# Patient Record
Sex: Female | Born: 1975 | Race: Black or African American | Hispanic: No | Marital: Single | Smoking: Current every day smoker
Health system: Southern US, Community
[De-identification: ages and names within clinical notes are randomized; demographics above are authoritative.]

## PROBLEM LIST (undated history)

## (undated) DIAGNOSIS — D509 Iron deficiency anemia, unspecified: Secondary | ICD-10-CM

## (undated) DIAGNOSIS — F319 Bipolar disorder, unspecified: Secondary | ICD-10-CM

## (undated) DIAGNOSIS — E119 Type 2 diabetes mellitus without complications: Secondary | ICD-10-CM

## (undated) DIAGNOSIS — G4730 Sleep apnea, unspecified: Secondary | ICD-10-CM

## (undated) DIAGNOSIS — F17200 Nicotine dependence, unspecified, uncomplicated: Secondary | ICD-10-CM

## (undated) DIAGNOSIS — G8929 Other chronic pain: Secondary | ICD-10-CM

## (undated) DIAGNOSIS — J45909 Unspecified asthma, uncomplicated: Secondary | ICD-10-CM

## (undated) DIAGNOSIS — D219 Benign neoplasm of connective and other soft tissue, unspecified: Secondary | ICD-10-CM

## (undated) DIAGNOSIS — I10 Essential (primary) hypertension: Secondary | ICD-10-CM

## (undated) HISTORY — PX: ESOPHAGOGASTRODUODENOSCOPY: SHX1529

## (undated) HISTORY — DX: Bipolar disorder, unspecified: F31.9

## (undated) HISTORY — PX: OTHER SURGICAL HISTORY: SHX169

---

## 2004-07-22 ENCOUNTER — Encounter: Admission: RE | Admit: 2004-07-22 | Discharge: 2004-07-22 | Payer: Self-pay | Admitting: Neurology

## 2004-08-12 ENCOUNTER — Encounter: Admission: RE | Admit: 2004-08-12 | Discharge: 2004-08-12 | Payer: Self-pay | Admitting: Neurology

## 2004-11-25 ENCOUNTER — Ambulatory Visit: Payer: Self-pay | Admitting: Specialist

## 2005-01-09 ENCOUNTER — Ambulatory Visit (HOSPITAL_BASED_OUTPATIENT_CLINIC_OR_DEPARTMENT_OTHER): Admission: RE | Admit: 2005-01-09 | Discharge: 2005-01-09 | Payer: Self-pay | Admitting: Neurology

## 2005-02-28 ENCOUNTER — Ambulatory Visit (HOSPITAL_BASED_OUTPATIENT_CLINIC_OR_DEPARTMENT_OTHER): Admission: RE | Admit: 2005-02-28 | Discharge: 2005-02-28 | Payer: Self-pay | Admitting: Psychiatry

## 2005-03-06 ENCOUNTER — Emergency Department: Payer: Self-pay | Admitting: Emergency Medicine

## 2005-03-08 ENCOUNTER — Emergency Department: Payer: Self-pay | Admitting: Emergency Medicine

## 2005-10-19 ENCOUNTER — Ambulatory Visit: Payer: Self-pay | Admitting: Obstetrics & Gynecology

## 2005-10-20 ENCOUNTER — Emergency Department: Payer: Self-pay | Admitting: Emergency Medicine

## 2005-11-21 ENCOUNTER — Ambulatory Visit: Payer: Self-pay | Admitting: Obstetrics & Gynecology

## 2005-11-21 ENCOUNTER — Ambulatory Visit (HOSPITAL_COMMUNITY): Admission: RE | Admit: 2005-11-21 | Discharge: 2005-11-21 | Payer: Self-pay | Admitting: Obstetrics & Gynecology

## 2006-07-10 ENCOUNTER — Emergency Department: Payer: Self-pay | Admitting: Emergency Medicine

## 2007-07-26 ENCOUNTER — Ambulatory Visit: Payer: Self-pay | Admitting: Family Medicine

## 2007-08-23 ENCOUNTER — Ambulatory Visit: Payer: Self-pay | Admitting: Advanced Practice Midwife

## 2008-01-07 ENCOUNTER — Inpatient Hospital Stay: Payer: Self-pay

## 2008-03-12 ENCOUNTER — Ambulatory Visit: Payer: Self-pay | Admitting: Obstetrics and Gynecology

## 2009-08-31 ENCOUNTER — Emergency Department: Payer: Self-pay | Admitting: Emergency Medicine

## 2009-11-03 IMAGING — US US OB < 14 WEEKS - US OB TV
1 series · 17 of 20 positions shown · non-contrast
Comparison: none

REASON FOR EXAM: Dates and viability
COMMENTS:

[Series 1: us ob < 14 weeks - us ob tv · 17 of 20 slices shown]
[im 1/20]
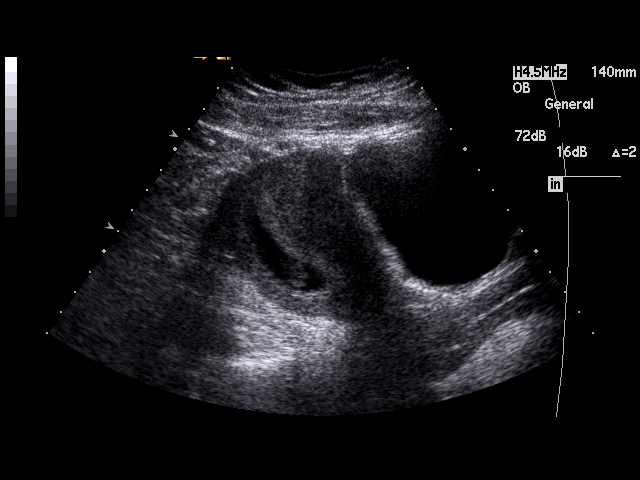
[im 2/20]
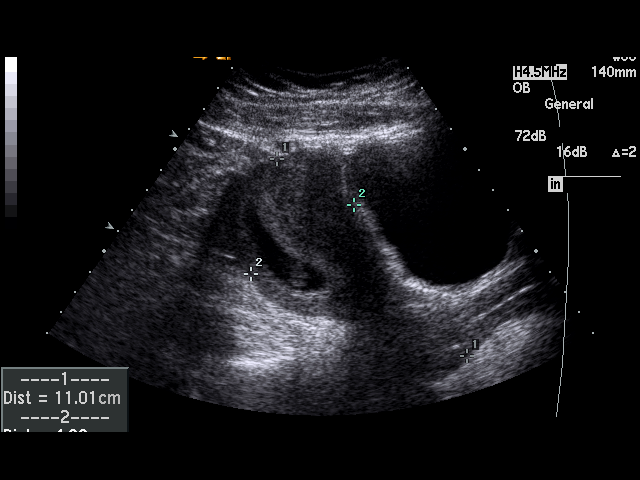
[im 3/20]
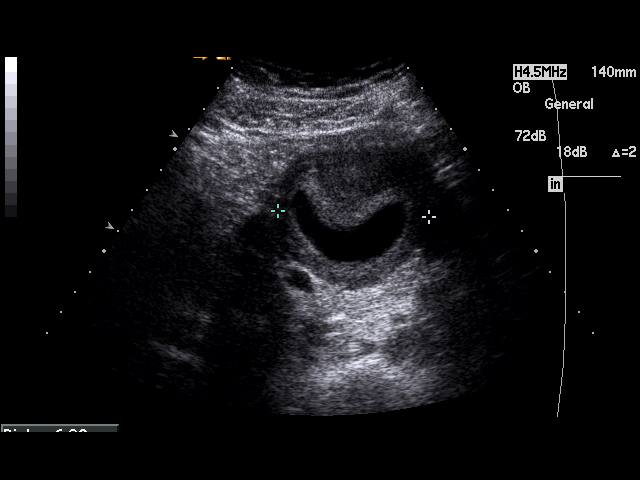
[im 5/20]
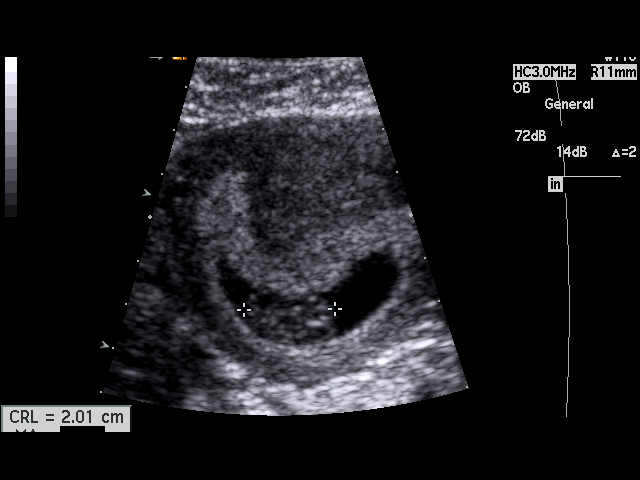
[im 6/20]
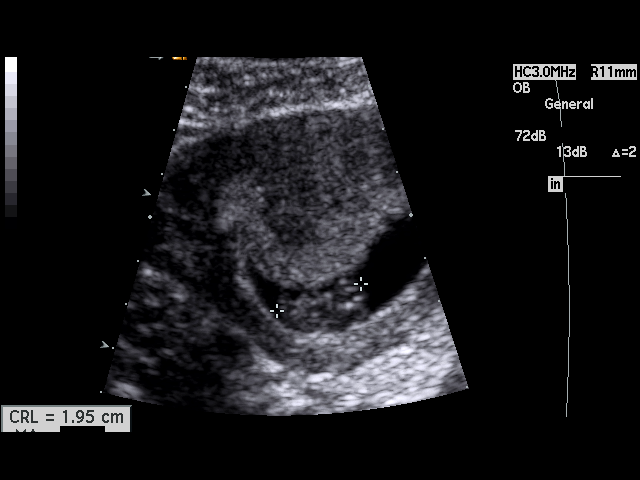
[im 7/20]
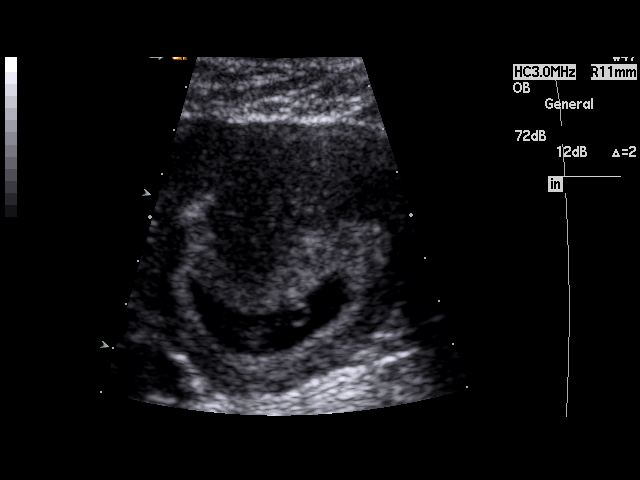
[im 8/20]
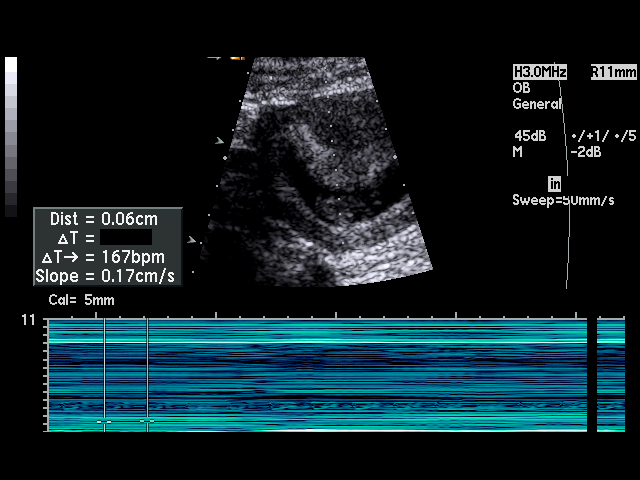
[im 9/20]
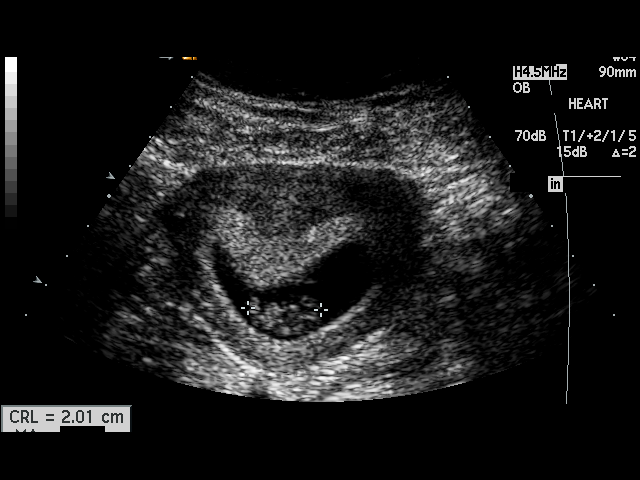
[im 11/20]
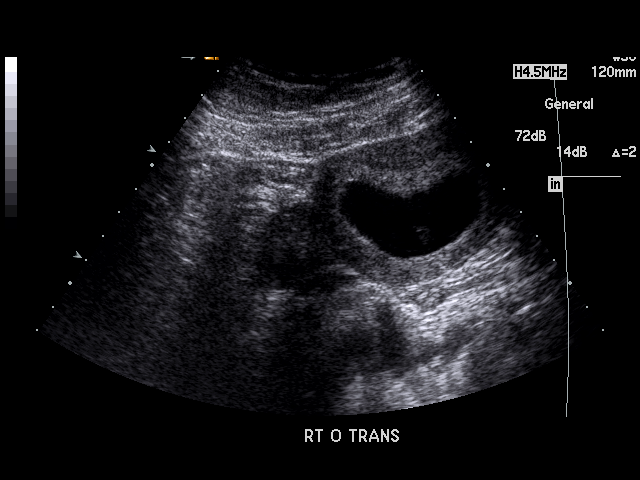
[im 12/20]
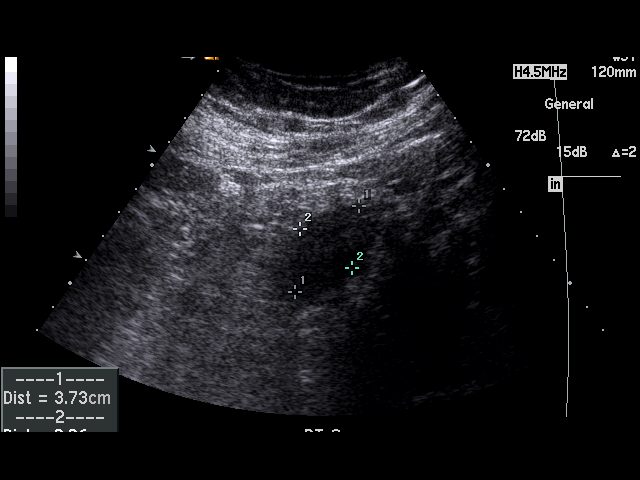
[im 13/20]
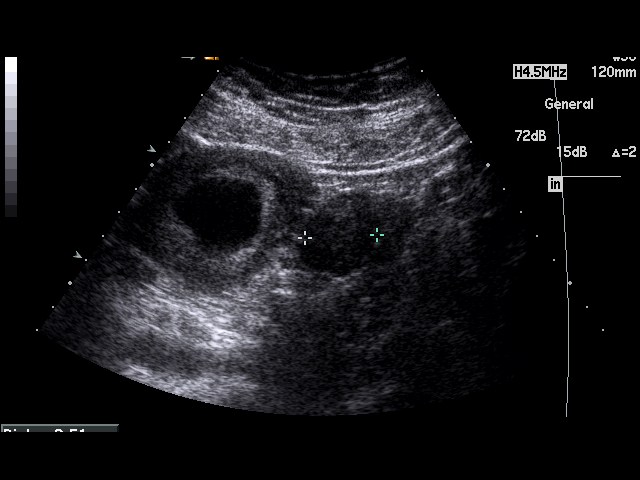
[im 14/20]
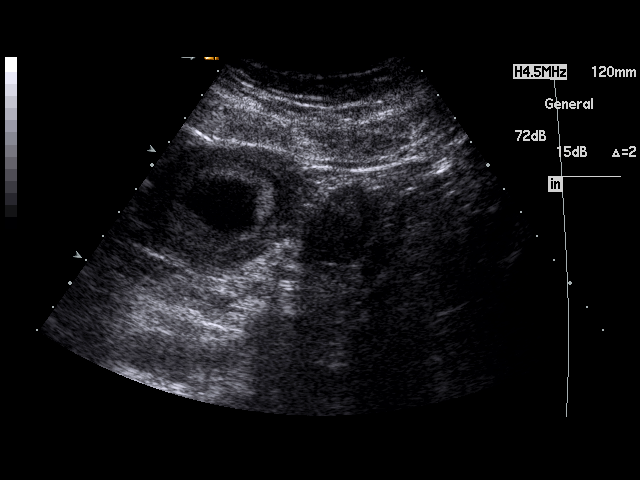
[im 15/20]
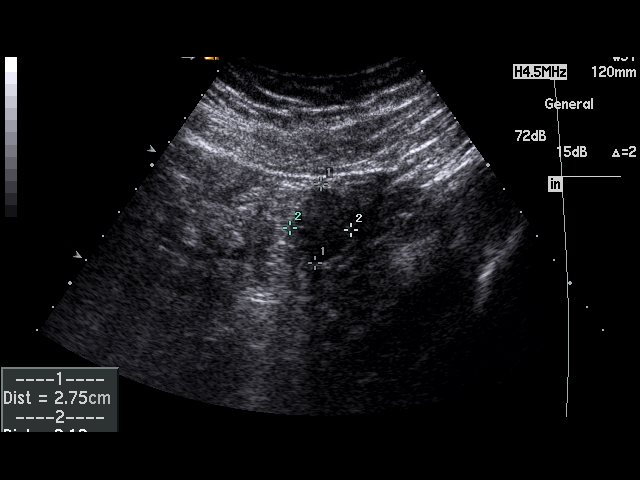
[im 16/20]
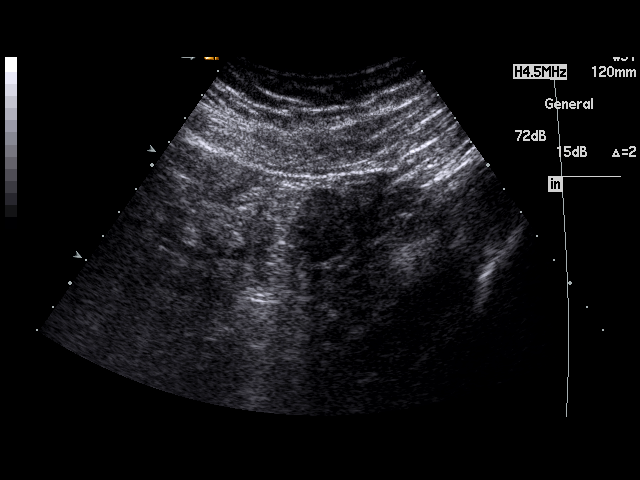
[im 18/20]
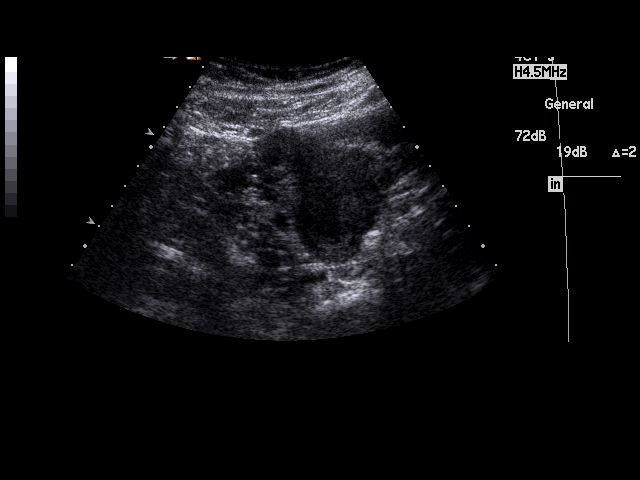
[im 19/20]
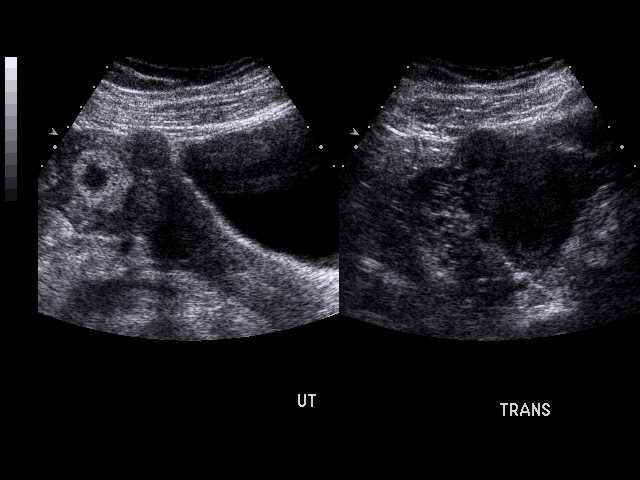
[im 20/20]
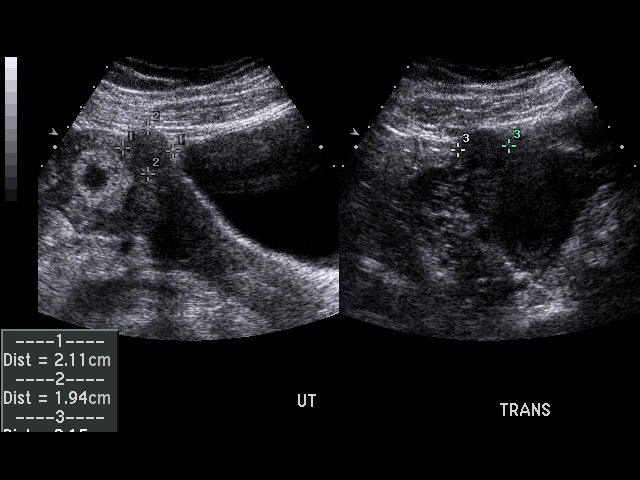

[17 of 20 positions shown; findings below may reference images not displayed]

PROCEDURE:     US  - US OB LESS THAN 14 WEEKS  - July 26, 2007  [DATE]

RESULT:     There is observed s single, living intrauterine gestation.
Embryo cardiac activity was monitored at 167 beats per minute. There is a
partially exophytic, hypoechoic mass off the anterior aspect of the uterus
consistent with a uterine fibroid that measures to 2.15 cm at maximum
diameter.

Fetal parts are too small to evaluate at this time. The crown-rump length
measures 2.0 cm which corresponds to estimated gestational age of 8 weeks, 4
days. Ultrasound EDD is 03/02/2008.

The maternal ovaries are visualized and are normal in appearance
bilaterally. No abnormal adnexal masses are seen. No free fluid is noted in
the maternal pelvis.
IMPRESSION: 1.  Living intrauterine gestation of approximately 8 weeks, 4 days
gestational age.
2.  Ultrasound EDD is 03/02/2008.
3.  There is a hypoechoic mass off the anterior aspect of the uterus
consistent with a uterine fibroid that measures 2.15 cm at maximum diameter.

## 2010-01-20 ENCOUNTER — Ambulatory Visit: Payer: Self-pay | Admitting: Internal Medicine

## 2010-01-29 ENCOUNTER — Emergency Department: Payer: Self-pay | Admitting: Emergency Medicine

## 2010-02-08 ENCOUNTER — Ambulatory Visit: Payer: Self-pay | Admitting: Surgery

## 2010-02-08 ENCOUNTER — Ambulatory Visit: Payer: Self-pay | Admitting: Internal Medicine

## 2010-02-14 ENCOUNTER — Ambulatory Visit: Payer: Self-pay | Admitting: Internal Medicine

## 2010-02-19 ENCOUNTER — Ambulatory Visit: Payer: Self-pay | Admitting: Internal Medicine

## 2010-06-29 ENCOUNTER — Emergency Department: Payer: Self-pay | Admitting: Emergency Medicine

## 2010-07-19 ENCOUNTER — Ambulatory Visit: Payer: Self-pay | Admitting: Internal Medicine

## 2010-07-21 ENCOUNTER — Ambulatory Visit: Payer: Self-pay | Admitting: Internal Medicine

## 2011-01-17 ENCOUNTER — Emergency Department: Payer: Self-pay | Admitting: Emergency Medicine

## 2011-04-30 ENCOUNTER — Emergency Department: Payer: Self-pay | Admitting: Emergency Medicine

## 2011-05-09 ENCOUNTER — Emergency Department: Payer: Self-pay | Admitting: Internal Medicine

## 2014-06-26 ENCOUNTER — Emergency Department: Payer: Self-pay | Admitting: Internal Medicine

## 2014-08-01 ENCOUNTER — Emergency Department: Payer: Self-pay | Admitting: Emergency Medicine

## 2014-09-24 ENCOUNTER — Other Ambulatory Visit: Payer: Self-pay

## 2014-09-24 DIAGNOSIS — Z1231 Encounter for screening mammogram for malignant neoplasm of breast: Secondary | ICD-10-CM

## 2014-11-12 DIAGNOSIS — Z8371 Family history of colonic polyps: Secondary | ICD-10-CM | POA: Insufficient documentation

## 2014-12-07 ENCOUNTER — Encounter: Payer: Self-pay | Admitting: *Deleted

## 2014-12-08 ENCOUNTER — Ambulatory Visit: Payer: Medicaid Other | Admitting: Anesthesiology

## 2014-12-08 ENCOUNTER — Encounter: Payer: Self-pay | Admitting: *Deleted

## 2014-12-08 ENCOUNTER — Encounter
Admission: RE | Disposition: A | Discharge status: Home / Self Care | Payer: Self-pay | Source: Ambulatory Visit | Attending: Gastroenterology

## 2014-12-08 ENCOUNTER — Ambulatory Visit
Admission: RE | Admit: 2014-12-08 | Discharge: 2014-12-08 | Disposition: A | Discharge status: Home / Self Care | Payer: Medicaid Other | Source: Ambulatory Visit | Attending: Gastroenterology | Admitting: Gastroenterology

## 2014-12-08 DIAGNOSIS — Z79899 Other long term (current) drug therapy: Secondary | ICD-10-CM | POA: Insufficient documentation

## 2014-12-08 DIAGNOSIS — Z7951 Long term (current) use of inhaled steroids: Secondary | ICD-10-CM | POA: Diagnosis not present

## 2014-12-08 DIAGNOSIS — I10 Essential (primary) hypertension: Secondary | ICD-10-CM | POA: Insufficient documentation

## 2014-12-08 DIAGNOSIS — Z888 Allergy status to other drugs, medicaments and biological substances status: Secondary | ICD-10-CM | POA: Insufficient documentation

## 2014-12-08 DIAGNOSIS — Z800 Family history of malignant neoplasm of digestive organs: Secondary | ICD-10-CM | POA: Insufficient documentation

## 2014-12-08 DIAGNOSIS — Z1211 Encounter for screening for malignant neoplasm of colon: Secondary | ICD-10-CM | POA: Diagnosis not present

## 2014-12-08 DIAGNOSIS — Z791 Long term (current) use of non-steroidal anti-inflammatories (NSAID): Secondary | ICD-10-CM | POA: Diagnosis not present

## 2014-12-08 DIAGNOSIS — Z8371 Family history of colonic polyps: Secondary | ICD-10-CM | POA: Diagnosis not present

## 2014-12-08 DIAGNOSIS — J45909 Unspecified asthma, uncomplicated: Secondary | ICD-10-CM | POA: Insufficient documentation

## 2014-12-08 HISTORY — DX: Unspecified asthma, uncomplicated: J45.909

## 2014-12-08 HISTORY — DX: Type 2 diabetes mellitus without complications: E11.9

## 2014-12-08 HISTORY — PX: COLONOSCOPY WITH PROPOFOL: SHX5780

## 2014-12-08 HISTORY — DX: Essential (primary) hypertension: I10

## 2014-12-08 LAB — POCT PREGNANCY, URINE: Preg Test, Ur: NEGATIVE

## 2014-12-08 SURGERY — COLONOSCOPY WITH PROPOFOL
Anesthesia: General

## 2014-12-08 MED ORDER — FENTANYL CITRATE (PF) 100 MCG/2ML IJ SOLN
INTRAMUSCULAR | Status: DC | PRN
  Administered 2014-12-08: 50 ug via INTRAVENOUS

## 2014-12-08 MED ORDER — IPRATROPIUM-ALBUTEROL 0.5-2.5 (3) MG/3ML IN SOLN
3.0000 mL | Freq: Four times a day (QID) | RESPIRATORY_TRACT | Status: DC
Start: 2014-12-08 — End: 2014-12-09
  Administered 2014-12-08: 3 mL via RESPIRATORY_TRACT

## 2014-12-08 MED ORDER — SODIUM CHLORIDE 0.9 % IV SOLN
INTRAVENOUS | Status: DC
  Administered 2014-12-08: 15:00:00 via INTRAVENOUS

## 2014-12-08 MED ORDER — PROPOFOL INFUSION 10 MG/ML OPTIME
INTRAVENOUS | Status: DC | PRN
  Administered 2014-12-08: 140 ug/kg/min via INTRAVENOUS

## 2014-12-08 MED ORDER — PROPOFOL 10 MG/ML IV BOLUS
INTRAVENOUS | Status: DC | PRN
  Administered 2014-12-08: 60 mg via INTRAVENOUS

## 2014-12-08 MED ORDER — MIDAZOLAM HCL 2 MG/2ML IJ SOLN
INTRAMUSCULAR | Status: DC | PRN
  Administered 2014-12-08: 1 mg via INTRAVENOUS

## 2014-12-08 MED ORDER — IPRATROPIUM-ALBUTEROL 0.5-2.5 (3) MG/3ML IN SOLN
RESPIRATORY_TRACT | Status: AC
  Administered 2014-12-08: 3 mL via RESPIRATORY_TRACT
  Filled 2014-12-08: qty 3

## 2014-12-08 NOTE — Anesthesia Postprocedure Evaluation (Signed)
  Anesthesia Post-op Note  Patient: Desiree Stone  Procedure(s) Performed: Procedure(s): COLONOSCOPY WITH PROPOFOL (N/A)  Anesthesia type:General  Patient location: PACU  Post pain: Pain level controlled  Post assessment: Post-op Vital signs reviewed, Patient's Cardiovascular Status Stable, Respiratory Function Stable, Patent Airway and No signs of Nausea or vomiting  Post vital signs: Reviewed and stable  Last Vitals:  Filed Vitals:   12/08/14 1622  BP: 124/81  Pulse: 87  Temp:   Resp: 21    Level of consciousness: awake, alert  and patient cooperative  Complications: No apparent anesthesia complications

## 2014-12-08 NOTE — Op Note (Signed)
[facility] Gastroenterology Patient Name: Desiree Stone Procedure Date: 12/08/2014 3:12 PM MRN: [id number] Account #: [id number] Date of Birth: 09-02-1975 Admit Type: Outpatient Age: 39 Room: [facility] ENDO ROOM 3 Gender: Female Note Status: Finalized Procedure:         Colonoscopy Indications:       Family history of colon cancer in multiple second-degree                     relatives, Family history of colonic polyps in a                     first-degree relative Providers:         Lollie Sails, MD Referring MD:      Sena Hitch, MD (Referring MD) Medicines:         Monitored Anesthesia Care Complications:     No immediate complications. Procedure:         Pre-Anesthesia Assessment:                    - ASA Grade Assessment: III - A patient with severe                     systemic disease.                    After obtaining informed consent, the colonoscope was                     passed under direct vision. Throughout the procedure, the                     patient's blood pressure, pulse, and oxygen saturations                     were monitored continuously. The Colonoscope was                     introduced through the anus and advanced to the the cecum,                     identified by appendiceal orifice and ileocecal valve. The                     colonoscopy was performed without difficulty. The patient                     tolerated the procedure well. The quality of the bowel                     preparation was fair. Findings:      The colon (entire examined portion) appeared normal.      The digital rectal exam was normal. Impression:        - The entire examined colon is normal.                    - No specimens collected. Recommendation:    - Discharge patient to home.                    - Repeat colonoscopy in 5 years for screening purposes. Procedure Code(s): --- Professional ---                    [phone], Colonoscopy, flexible;  diagnostic, including  collection of specimen(s) by brushing or washing, when                     performed (separate procedure) Diagnosis Code(s): --- Professional ---                    V16.0, Family history of malignant neoplasm of                     gastrointestinal tract                    V18.51, Family history of colonic polyps CPT copyright 2014 American Medical Association. All rights reserved. The codes documented in this report are preliminary and upon coder review may  be revised to meet current compliance requirements. Lollie Sails, MD 12/08/2014 3:45:12 PM This report has been signed electronically. Number of Addenda: 0 Note Initiated On: 12/08/2014 3:12 PM Scope Withdrawal Time: 0 hours 9 minutes 30 seconds  Total Procedure Duration: 0 hours 16 minutes 36 seconds       [facility]

## 2014-12-08 NOTE — H&P (Signed)
Outpatient short stay form Pre-procedure 12/08/2014 3:08 PM Lollie Sails MD  Primary Physician: Dr Shade Flood  Reason for visit:  Colonoscopy  History of present illness:  Family history of colon polyps and multiple's first degree relatives and family history of colon cancer in secondary relative patient is a 40 year old female presenting for a risk colonoscopy due to history as noted above. He tolerated her prep for procedure well. He denies use of any aspirin or NSAID products. She does have a history of asthma but states that this is well controlled and she will have only about perhaps twice a year.    Current facility-administered medications:  .  0.9 %  sodium chloride infusion, , Intravenous, Continuous, Lollie Sails, MD, Last Rate: 50 mL/hr at 12/08/14 1438 .  0.9 %  sodium chloride infusion, , Intravenous, Continuous, Lollie Sails, MD .  ipratropium-albuterol (DUONEB) 0.5-2.5 (3) MG/3ML nebulizer solution 3 mL, 3 mL, Nebulization, Q6H, Lollie Sails, MD, 3 mL at 12/08/14 1438  Prescriptions prior to admission  Medication Sig Dispense Refill Last Dose  . albuterol (PROVENTIL HFA;VENTOLIN HFA) 108 (90 BASE) MCG/ACT inhaler Inhale into the lungs every 6 (six) hours as needed for wheezing or shortness of breath.     . enalapril (VASOTEC) 2.5 MG tablet Take 2.5 mg by mouth daily.     . fluticasone (FLOVENT DISKUS) 50 MCG/BLIST diskus inhaler Inhale 1 puff into the lungs 2 (two) times daily.     . meloxicam (MOBIC) 7.5 MG tablet Take 7.5 mg by mouth daily.        Allergies  Allergen Reactions  . Abilify [Aripiprazole]   . Effexor [Venlafaxine]   . Wellbutrin [Bupropion]      Past Medical History  Diagnosis Date  . Asthma   . Hypertension     Review of systems:      Physical Exam    Heart and lungs: Regular rate and rhythm without rub or gallop, lungs are bilaterally clear    HEENT: Normocephalic atraumatic eyes are anicteric    Other:     Pertinant  exam for procedure: Soft nontender nondistended bowel sounds positive normoactive    Planned proceedures: Anoscopy and indicated procedures I have discussed the risks benefits and complications of procedures to include not limited to bleeding, infection, perforation and the risk of sedation and the patient wishes to proceed.    Lollie Sails, MD Gastroenterology 12/08/2014  3:08 PM

## 2014-12-08 NOTE — Anesthesia Preprocedure Evaluation (Signed)
Anesthesia Evaluation  Patient identified by MRN, date of birth, ID band Patient awake    Reviewed: Allergy & Precautions, H&P , NPO status , Patient's Chart, lab work & pertinent test results, reviewed documented beta blocker date and time   History of Anesthesia Complications Negative for: history of anesthetic complications  Airway Mallampati: II  TM Distance: >3 FB Neck ROM: full    Dental  (+) Implants, Poor Dentition   Pulmonary asthma , Current Smoker,  breath sounds clear to auscultation  Pulmonary exam normal       Cardiovascular Exercise Tolerance: Good hypertension, Normal cardiovascular examRhythm:regular Rate:Normal     Neuro/Psych negative neurological ROS  negative psych ROS   GI/Hepatic negative GI ROS, Neg liver ROS,   Endo/Other  negative endocrine ROS  Renal/GU negative Renal ROS  negative genitourinary   Musculoskeletal   Abdominal   Peds  Hematology negative hematology ROS (+)   Anesthesia Other Findings Past Medical History:   Asthma                                                       Hypertension                                                Super Morbid Obesity BMI 56 Signs and symptoms suggestive of sleep apnea    Reproductive/Obstetrics negative OB ROS                             Anesthesia Physical Anesthesia Plan  ASA: III  Anesthesia Plan: General   Post-op Pain Management:    Induction:   Airway Management Planned:   Additional Equipment:   Intra-op Plan:   Post-operative Plan:   Informed Consent: I have reviewed the patients History and Physical, chart, labs and discussed the procedure including the risks, benefits and alternatives for the proposed anesthesia with the patient or authorized representative who has indicated his/her understanding and acceptance.   Dental Advisory Given  Plan Discussed with: Anesthesiologist, CRNA and  Surgeon  Anesthesia Plan Comments:         Anesthesia Quick Evaluation

## 2014-12-08 NOTE — Transfer of Care (Signed)
Immediate Anesthesia Transfer of Care Note  Patient: Desiree Stone  Procedure(s) Performed: Procedure(s): COLONOSCOPY WITH PROPOFOL (N/A)  Patient Location: PACU  Anesthesia Type:General  Level of Consciousness: awake and patient cooperative  Airway & Oxygen Therapy: Patient Spontanous Breathing and Patient connected to nasal cannula oxygen  Post-op Assessment: Report given to RN  Post vital signs: Reviewed and stable  Last Vitals:  Filed Vitals:   12/08/14 1551  BP: 115/67  Pulse: 97  Temp: 96.6  Resp: 22    Complications: No apparent anesthesia complications

## 2014-12-14 ENCOUNTER — Encounter: Payer: Self-pay | Admitting: Gastroenterology

## 2015-08-02 ENCOUNTER — Encounter: Payer: Self-pay | Admitting: *Deleted

## 2015-08-02 ENCOUNTER — Emergency Department
Admission: EM | Admit: 2015-08-02 | Discharge: 2015-08-02 | Disposition: A | Discharge status: Home / Self Care | Payer: Medicaid Other | Attending: Emergency Medicine | Admitting: Emergency Medicine

## 2015-08-02 ENCOUNTER — Emergency Department: Payer: Medicaid Other

## 2015-08-02 DIAGNOSIS — M7532 Calcific tendinitis of left shoulder: Secondary | ICD-10-CM | POA: Insufficient documentation

## 2015-08-02 DIAGNOSIS — Z7951 Long term (current) use of inhaled steroids: Secondary | ICD-10-CM | POA: Insufficient documentation

## 2015-08-02 DIAGNOSIS — I10 Essential (primary) hypertension: Secondary | ICD-10-CM | POA: Insufficient documentation

## 2015-08-02 DIAGNOSIS — F17210 Nicotine dependence, cigarettes, uncomplicated: Secondary | ICD-10-CM | POA: Insufficient documentation

## 2015-08-02 DIAGNOSIS — E119 Type 2 diabetes mellitus without complications: Secondary | ICD-10-CM | POA: Insufficient documentation

## 2015-08-02 DIAGNOSIS — Z79899 Other long term (current) drug therapy: Secondary | ICD-10-CM | POA: Diagnosis not present

## 2015-08-02 DIAGNOSIS — M25512 Pain in left shoulder: Secondary | ICD-10-CM | POA: Diagnosis present

## 2015-08-02 MED ORDER — NAPROXEN 500 MG PO TABS: 500.0000 mg | ORAL_TABLET | Freq: Two times a day (BID) | ORAL | Status: AC

## 2015-08-02 NOTE — Discharge Instructions (Signed)
Calcific Tendinitis Calcific tendinitis occurs when crystals of calcium are deposited in a tendon. Tendons are bands of strong, fibrous tissue that attach muscles to bones. Tendons are an important part of joints. They make the joint move and they absorb some of the stress that a joint receives during use. When calcium is deposited in the tendon, the tendon becomes stiff, painful, and it can become swollen. Calcific tendinitis occurs frequently in the shoulder joint, in a structure called the rotator cuff. CAUSES  The cause of calcific tendinitis is unclear. It may be associated with:  Overuse of the tendon, such as from repetitive motion.  Excess stress on the tendon.  Aging.  Repetitive, mild injuries. SYMPTOMS   Pain may or may not be present. If it is present, it may occur when moving the joint.  Tenderness when pressure is applied to the tendon.  A snapping or popping sound when the joint moves.  Decreased motion of the joint.  Difficulty sleeping due to pain in the joint. DIAGNOSIS  Your health care provider will perform a physical exam. Imaging tests may also be used to make the diagnosis. These may include X-rays, an MRI, or a CT scan. TREATMENT  Generally, calcific tendinitis resolves on its own. Treatment for pain of calcific tendinitis may include:  Taking over-the-counter medicines, such as anti-inflammatory drugs.  Applying ice packs to the joint.  Following a specific exercise program to keep the joint working properly.  Attending physical therapy sessions.  Avoiding activities that cause pain. Treatment for more severe calcific tendinitis may require:  Injecting cortisone steroids or pain relieving medicines into or around the joint.  Manipulating the joint after you are given medicine to numb the area (local anesthetic).  Inflating the joint with sterile fluid to increase the flexibility of the tendons.  Shock wave therapy, which involves focusing sound  waves on the joint. If other treatments do not work, surgery may be done to clean out the calcium deposits and repair the tendons where needed. Most people do not need surgery. HOME CARE INSTRUCTIONS   Only take over-the-counter or prescription medicines for pain, fever, or discomfort as directed by your health care provider.  Follow your health care provider's recommendations for activity and exercise. SEEK MEDICAL CARE IF:  You notice an increase in pain or numbness.  You develop new weakness.  You notice increased joint stiffness or a sensation of looseness in the joint.  You notice increasing redness, swelling, or warmth around the joint area. SEEK IMMEDIATE MEDICAL CARE IF:  You have a fever or persistent symptoms for more than 2 to 3 days.  You have a fever and your symptoms suddenly get worse. MAKE SURE YOU:  Understand these instructions.  Will watch your condition.  Will get help right away if you are not doing well or get worse.   This information is not intended to replace advice given to you by your health care provider. Make sure you discuss any questions you have with your health care provider.   Document Released: 02/15/2008 Document Revised: 01/27/2015 Document Reviewed: 08/17/2011 Elsevier Interactive Patient Education [employer].

## 2015-08-02 NOTE — ED Notes (Signed)
Pt woke up with left shoulder pain on Saturday, pt denies any other symptoms including chest pain

## 2015-08-02 NOTE — ED Provider Notes (Signed)
[facility] Emergency Department Provider Note  ____________________________________________  Time seen: Approximately 10:06 AM  I have reviewed the triage vital signs and the nursing notes.   HISTORY  Chief Complaint Shoulder Pain    HPI Desiree Stone is a 40 y.o. female complaining of 2 days of left shoulder pain. Patient denies any provocative incident for this complaint. Patient states pain increase with movement. Patient stated there was some mild relief taking over-the-counter ibuprofen last night. Patient rates the pain as a 5/10. Patient described the pain as sharp. Pain is located at the [facility] joint. Patient is right-hand dominant.   Past Medical History  Diagnosis Date  . Asthma   . Hypertension     There are no active problems to display for this patient.   Past Surgical History  Procedure Laterality Date  . Carpel tunnel    . Esophagogastroduodenoscopy    . Colonoscopy with propofol N/A 12/08/2014    Procedure: COLONOSCOPY WITH PROPOFOL;  Surgeon: Lollie Sails, MD;  Location: [facility] ENDOSCOPY;  Service: Endoscopy;  Laterality: N/A;    Current Outpatient Rx  Name  Route  Sig  Dispense  Refill  . albuterol (PROVENTIL HFA;VENTOLIN HFA) 108 (90 BASE) MCG/ACT inhaler   Inhalation   Inhale into the lungs every 6 (six) hours as needed for wheezing or shortness of breath.         . enalapril (VASOTEC) 2.5 MG tablet   Oral   Take 2.5 mg by mouth daily.         . fluticasone (FLOVENT DISKUS) 50 MCG/BLIST diskus inhaler   Inhalation   Inhale 1 puff into the lungs 2 (two) times daily.         . meloxicam (MOBIC) 7.5 MG tablet   Oral   Take 7.5 mg by mouth daily.           Allergies Abilify; Effexor; and Wellbutrin  No family history on file.  Social History Social History  Substance Use Topics  . Smoking status: Current Every Day Smoker -- 2.00 packs/day    Types: Cigarettes  . Smokeless tobacco: Never Used  .  Alcohol Use: No    Review of Systems Constitutional: No fever/chills Eyes: No visual changes. ENT: No sore throat. Cardiovascular: Denies chest pain. Respiratory: Denies shortness of breath. Gastrointestinal: No abdominal pain.  No nausea, no vomiting.  No diarrhea.  No constipation. Genitourinary: Negative for dysuria. Musculoskeletal: Negative for back pain. Skin: Negative for rash. Neurological: Negative for headaches, focal weakness or numbness. : Endocrine: Hypertension and diabetes. Allergic/Immunilogical: See medication list   ____________________________________________   PHYSICAL EXAM:  VITAL SIGNS: ED Triage Vitals  Enc Vitals Group     BP 08/02/15 0947 130/72 mmHg     Pulse Rate 08/02/15 0947 100     Resp 08/02/15 0947 20     Temp 08/02/15 0947 98.8 F (37.1 C)     Temp Source 08/02/15 0947 Oral     SpO2 08/02/15 0947 96 %     Weight 08/02/15 0947 273 lb (123.832 kg)     Height 08/02/15 0947 4\' 11"  (1.499 m)     Head Cir --      Peak Flow --      Pain Score 08/02/15 0948 5     Pain Loc --      Pain Edu? --      Excl. in [place]? --     Constitutional: Alert and oriented. Well appearing and in no acute  distress. Eyes: Conjunctivae are normal. PERRL. EOMI. Head: Atraumatic. Nose: No congestion/rhinnorhea. Mouth/Throat: Mucous membranes are moist.  Oropharynx non-erythematous. Neck: No stridor.  No cervical spine tenderness to palpation. Hematological/Lymphatic/Immunilogical: No cervical lymphadenopathy. Cardiovascular: Normal rate, regular rhythm. Grossly normal heart sounds.  Good peripheral circulation. Respiratory: Normal respiratory effort.  No retractions. Lungs CTAB. Gastrointestinal: Soft and nontender. No distention. No abdominal bruits. No CVA tenderness. Musculoskeletal: No obvious edema or erythema to the left shoulder. Patient has moderate guarding palpation to the [facility] joint. Patient has decreased range of motion with abduction and overhead  reaching. Patient stated against resistance as a 3 over 5.  Neurologic:  Normal speech and language. No gross focal neurologic deficits are appreciated. No gait instability. Skin:  Skin is warm, dry and intact. No rash noted. Psychiatric: Mood and affect are normal. Speech and behavior are normal.  ____________________________________________   LABS (all labs ordered are listed, but only abnormal results are displayed)  Labs Reviewed - No data to display ____________________________________________  EKG   ____________________________________________  RADIOLOGY  I also 5 tendinitis of the rotator cuff.  I, Sable Feil, personally viewed and evaluated these images (plain radiographs) as part of my medical decision making, as well as reviewing the written report by the radiologist. ____________________________________________   PROCEDURES  Procedure(s) performed: None  Critical Care performed: No  ____________________________________________   INITIAL IMPRESSION / ASSESSMENT AND PLAN / ED COURSE  Pertinent labs & imaging results that were available during my care of the patient were reviewed by me and considered in my medical decision making (see chart for details).  Calcified tendinitis of the shoulder. This x-ray finding with patient. Patient was placed in a sling and start naproxen as directed. Patient advised to follow orthopedics for definitive evaluation and treatment. He shouldn't work note for 2 days. ____________________________________________   FINAL CLINICAL IMPRESSION(S) / ED DIAGNOSES  Final diagnoses:  Calcific tendinitis of left shoulder      Sable Feil, PA-C 08/02/15 1115  Harvest Dark, MD 08/02/15 1431

## 2015-08-02 NOTE — ED Notes (Signed)
States she developed some left shoulder pain on Saturday. Denies any injury.  Pain increases with movement .[name] States she had min relief with OTC ibuprofen last pm

## 2016-04-12 DIAGNOSIS — G4700 Insomnia, unspecified: Secondary | ICD-10-CM | POA: Insufficient documentation

## 2016-04-12 DIAGNOSIS — IMO0001 Reserved for inherently not codable concepts without codable children: Secondary | ICD-10-CM | POA: Insufficient documentation

## 2016-06-14 DIAGNOSIS — F17200 Nicotine dependence, unspecified, uncomplicated: Secondary | ICD-10-CM | POA: Insufficient documentation

## 2016-06-14 DIAGNOSIS — G4733 Obstructive sleep apnea (adult) (pediatric): Secondary | ICD-10-CM | POA: Insufficient documentation

## 2017-04-24 ENCOUNTER — Emergency Department
Admission: EM | Admit: 2017-04-24 | Discharge: 2017-04-24 | Disposition: A | Discharge status: Home / Self Care | Payer: Self-pay | Attending: Emergency Medicine | Admitting: Emergency Medicine

## 2017-04-24 ENCOUNTER — Other Ambulatory Visit: Payer: Self-pay

## 2017-04-24 DIAGNOSIS — J020 Streptococcal pharyngitis: Secondary | ICD-10-CM | POA: Insufficient documentation

## 2017-04-24 DIAGNOSIS — I10 Essential (primary) hypertension: Secondary | ICD-10-CM | POA: Insufficient documentation

## 2017-04-24 DIAGNOSIS — F17210 Nicotine dependence, cigarettes, uncomplicated: Secondary | ICD-10-CM | POA: Insufficient documentation

## 2017-04-24 DIAGNOSIS — R51 Headache: Secondary | ICD-10-CM | POA: Insufficient documentation

## 2017-04-24 DIAGNOSIS — Z79899 Other long term (current) drug therapy: Secondary | ICD-10-CM | POA: Insufficient documentation

## 2017-04-24 DIAGNOSIS — J45909 Unspecified asthma, uncomplicated: Secondary | ICD-10-CM | POA: Insufficient documentation

## 2017-04-24 LAB — POCT RAPID STREP A: Streptococcus, Group A Screen (Direct): POSITIVE — AB

## 2017-04-24 MED ORDER — FLUCONAZOLE 150 MG PO TABS: ORAL_TABLET | ORAL | 0 refills | Status: DC

## 2017-04-24 MED ORDER — AMOXICILLIN 400 MG/5ML PO SUSR: ORAL | 0 refills | Status: DC

## 2017-04-24 NOTE — ED Triage Notes (Signed)
Pt started with sore throat and headache yesterday - c/o chills last pm - also c/o right upper leg pain (denies injury)

## 2017-04-24 NOTE — ED Provider Notes (Signed)
[facility] Emergency Department Provider Note  ____________________________________________   First MD Initiated Contact with Patient 04/24/17 1358     (approximate)  I have reviewed the triage vital signs and the nursing notes.   HISTORY  Chief Complaint Sore Throat and Headache    HPI Desiree Stone is a 41 y.o. 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 Asthma   . Hypertension     There are no active problems to display for this patient.   Past Surgical History:  Procedure Laterality Date  . carpel tunnel    . COLONOSCOPY WITH PROPOFOL N/A 12/08/2014   Procedure: COLONOSCOPY WITH PROPOFOL;  Surgeon: Lollie Sails, MD;  Location: [facility] ENDOSCOPY;  Service: Endoscopy;  Laterality: N/A;  . ESOPHAGOGASTRODUODENOSCOPY      Prior to Admission medications   Medication Sig Start Date End Date Taking? Authorizing Provider  albuterol (PROVENTIL HFA;VENTOLIN HFA) 108 (90 BASE) MCG/ACT inhaler Inhale into the lungs every 6 (six) hours as needed for wheezing or shortness of breath.    [provider]  amoxicillin (AMOXIL) 400 MG/5ML suspension Take 2 1/4 tsp bid for 10 days 04/24/17   Versie Starks, PA-C  enalapril (VASOTEC) 2.5 MG tablet Take 2.5 mg by mouth daily.    [provider]  fluconazole (DIFLUCAN) 150 MG tablet Take one now and one in a week 04/24/17   Caryn Section, Linden Dolin, PA-C  fluticasone (FLOVENT DISKUS) 50 MCG/BLIST diskus inhaler Inhale 1 puff into the lungs 2 (two) times daily.    [provider]  meloxicam (MOBIC) 7.5 MG tablet Take 7.5 mg by mouth daily.    [provider]    Allergies Abilify [aripiprazole]; Effexor [venlafaxine]; and Wellbutrin [bupropion]  No family history on  file.  Social History Social History   Tobacco Use  . Smoking status: Current Every Day Smoker    Packs/day: 2.00    Types: Cigarettes  . Smokeless tobacco: Never Used  Substance Use Topics  . Alcohol use: No  . Drug use: No    Review of Systems  Constitutional: Positive fever/chills Eyes: No visual changes. ENT: Positive sore throat. Respiratory: Denies cough Genitourinary: Negative for dysuria. Musculoskeletal: Negative for back pain. Skin: Negative for rash.    ____________________________________________   PHYSICAL EXAM:  VITAL SIGNS: ED Triage Vitals  Enc Vitals Group     BP 04/24/17 1340 119/74     Pulse Rate 04/24/17 1340 (!) 117     Resp 04/24/17 1340 19     Temp 04/24/17 1340 99.1 F (37.3 C)     Temp Source 04/24/17 1340 Oral     SpO2 04/24/17 1340 93 %     Weight 04/24/17 1339 278 lb (126.1 kg)     Height 04/24/17 1339 4\' 11"  (1.499 m)     Head Circumference --      Peak Flow --      Pain Score 04/24/17 1338 10     Pain Loc --      Pain Edu? --      Excl. in [place]? --     Constitutional: Alert and oriented. Well appearing and in no acute distress. Eyes: Conjunctivae are normal.  Head: Atraumatic. Nose: No congestion/rhinnorhea. Mouth/Throat: Mucous membranes are moist.  Throat  is red and swollen Cardiovascular: Normal rate, regular rhythm. Heart sounds are normal, lungs are clear to auscultation Respiratory: Normal respiratory effort.  No retractions GU: deferred Musculoskeletal: FROM all extremities, warm and well perfused Neurologic:  Normal speech and language.  Skin:  Skin is warm, dry and intact. No rash noted. Psychiatric: Mood and affect are normal. Speech and behavior are normal.  ____________________________________________   LABS (all labs ordered are listed, but only abnormal results are displayed)  Labs Reviewed  POCT RAPID STREP A - Abnormal; Notable for the following components:      Result Value   Streptococcus, Group A  Screen (Direct) POSITIVE (*)    All other components within normal limits   ____________________________________________   ____________________________________________  RADIOLOGY    ____________________________________________   PROCEDURES  Procedure(s) performed: No      ____________________________________________   INITIAL IMPRESSION / ASSESSMENT AND PLAN / ED COURSE  Pertinent labs & imaging results that were available during my care of the patient were reviewed by me and considered in my medical decision making (see chart for details).  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 Use AVSMEDLIST instead to display the medication list for a patient.   Note:  This document was prepared using Dragon voice recognition software and may include unintentional dictation errors.    Versie Starks, PA-C 04/24/17 1512    Earleen Newport, MD 04/24/17 [phone]

## 2017-04-24 NOTE — Discharge Instructions (Signed)
Follow up with your regular doctor if you're not better in 3 days, use the medication as prescribed, you have been given a prescription for amoxicillin which is the antibiotic, and a prescription for Diflucan if you develop a yeast infection from the antibiotics, gargle with warm salt water, take Tylenol or Advil for pain and fever as needed, return to the emergency department if you're worsening

## 2017-04-24 NOTE — ED Notes (Signed)
See triage note.

## 2017-05-22 ENCOUNTER — Emergency Department
Admission: EM | Admit: 2017-05-22 | Discharge: 2017-05-22 | Disposition: A | Discharge status: Home / Self Care | Payer: Self-pay | Attending: Emergency Medicine | Admitting: Emergency Medicine

## 2017-05-22 ENCOUNTER — Encounter: Payer: Self-pay | Admitting: Emergency Medicine

## 2017-05-22 ENCOUNTER — Emergency Department: Payer: Self-pay

## 2017-05-22 DIAGNOSIS — R350 Frequency of micturition: Secondary | ICD-10-CM | POA: Insufficient documentation

## 2017-05-22 DIAGNOSIS — J45909 Unspecified asthma, uncomplicated: Secondary | ICD-10-CM | POA: Insufficient documentation

## 2017-05-22 DIAGNOSIS — I10 Essential (primary) hypertension: Secondary | ICD-10-CM | POA: Insufficient documentation

## 2017-05-22 DIAGNOSIS — Z79899 Other long term (current) drug therapy: Secondary | ICD-10-CM | POA: Insufficient documentation

## 2017-05-22 DIAGNOSIS — R102 Pelvic and perineal pain: Secondary | ICD-10-CM

## 2017-05-22 DIAGNOSIS — Z87891 Personal history of nicotine dependence: Secondary | ICD-10-CM | POA: Insufficient documentation

## 2017-05-22 DIAGNOSIS — D259 Leiomyoma of uterus, unspecified: Secondary | ICD-10-CM | POA: Insufficient documentation

## 2017-05-22 LAB — URINALYSIS, COMPLETE (UACMP) WITH MICROSCOPIC
BACTERIA UA: NONE SEEN
BILIRUBIN URINE: NEGATIVE
Glucose, UA: NEGATIVE mg/dL
Hgb urine dipstick: NEGATIVE
Ketones, ur: NEGATIVE mg/dL
LEUKOCYTES UA: NEGATIVE
Nitrite: NEGATIVE
PH: 7 (ref 5.0–8.0)
Protein, ur: NEGATIVE mg/dL
RBC / HPF: NONE SEEN RBC/hpf (ref 0–5)
SPECIFIC GRAVITY, URINE: 1.014 (ref 1.005–1.030)

## 2017-05-22 LAB — POCT PREGNANCY, URINE: PREG TEST UR: NEGATIVE

## 2017-05-22 MED ORDER — NAPROXEN 500 MG PO TABS: 500.0000 mg | ORAL_TABLET | Freq: Two times a day (BID) | ORAL | 0 refills | Status: DC

## 2017-05-22 MED ORDER — TRAMADOL HCL 50 MG PO TABS: 50.0000 mg | ORAL_TABLET | Freq: Four times a day (QID) | ORAL | 0 refills | Status: AC | PRN

## 2017-05-22 NOTE — ED Triage Notes (Signed)
Pt in via POV with complaints of urinary frequency and lower abdominal pain since this morning.  Pt denies any N/V/D.  Pt abmulatory to triage, vitals WDL, NAD noted at this time.

## 2017-05-22 NOTE — ED Provider Notes (Signed)
[facility] Emergency Department Provider Note   ____________________________________________   First MD Initiated Contact with Patient 05/22/17 1143     (approximate)  I have reviewed the triage vital signs and the nursing notes.   HISTORY  Chief Complaint Urinary Tract Infection    HPI Desiree Stone is a 42 y.o. female patient is complaining of urinary frequency and low bone abdominal pain with a.m. awakening. Patient denies nausea vomiting diarrhea. Patient denies flank pain or fever. Patient rates the pain as a 7/10. Patient describes the pain as "achy/tender/throbbing". No palliative measures for complaint.   Past Medical History:  Diagnosis Date  . Asthma   . Hypertension     There are no active problems to display for this patient.   Past Surgical History:  Procedure Laterality Date  . carpel tunnel    . COLONOSCOPY WITH PROPOFOL N/A 12/08/2014   Procedure: COLONOSCOPY WITH PROPOFOL;  Surgeon: Lollie Sails, MD;  Location: [facility] ENDOSCOPY;  Service: Endoscopy;  Laterality: N/A;  . ESOPHAGOGASTRODUODENOSCOPY      Prior to Admission medications   Medication Sig Start Date End Date Taking? Authorizing Provider  albuterol (PROVENTIL HFA;VENTOLIN HFA) 108 (90 BASE) MCG/ACT inhaler Inhale into the lungs every 6 (six) hours as needed for wheezing or shortness of breath.    [provider]  amoxicillin (AMOXIL) 400 MG/5ML suspension Take 2 1/4 tsp bid for 10 days 04/24/17   Versie Starks, PA-C  enalapril (VASOTEC) 2.5 MG tablet Take 2.5 mg by mouth daily.    [provider]  fluconazole (DIFLUCAN) 150 MG tablet Take one now and one in a week 04/24/17   Caryn Section, Linden Dolin, PA-C  fluticasone (FLOVENT DISKUS) 50 MCG/BLIST diskus inhaler Inhale 1 puff into the lungs 2 (two) times daily.    [provider]  meloxicam (MOBIC) 7.5 MG tablet Take 7.5 mg by mouth daily.    [provider]  naproxen (NAPROSYN) 500 MG  tablet Take 1 tablet (500 mg total) by mouth 2 (two) times daily with a meal. 05/22/17   Sable Feil, PA-C  traMADol (ULTRAM) 50 MG tablet Take 1 tablet (50 mg total) by mouth every 6 (six) hours as needed. 05/22/17 05/22/18  Sable Feil, PA-C    Allergies   No family history on file.  Social History Social History   Tobacco Use  . Smoking status: Former Smoker    Packs/day: 0.00    Types: Cigarettes    Last attempt to quit: 04/04/2017    Years since quitting: 0.1  . Smokeless tobacco: Never Used  Substance Use Topics  . Alcohol use: No  . Drug use: No    Review of Systems Constitutional: No fever/chills Eyes: No visual changes. ENT: No sore throat. Cardiovascular: Denies chest pain. Respiratory: Denies shortness of breath. Gastrointestinal: No abdominal pain.  No nausea, no vomiting.  No diarrhea.  No constipation. Genitourinary: Urinary frequency and pelvic pain. Musculoskeletal: Negative for back pain. Skin: Negative for rash. Neurological: Negative for headaches, focal weakness or numbness. Endocrine:Hypertension ____________________________________________   PHYSICAL EXAM:  VITAL SIGNS: ED Triage Vitals  Enc Vitals Group     BP 05/22/17 1111 (!) 164/72     Pulse Rate 05/22/17 1111 74     Resp --      Temp 05/22/17 1111 98.1 F (36.7 C)     Temp Source 05/22/17 1111 Oral     SpO2 05/22/17 1111 95 %     Weight 05/22/17  1112 260 lb (117.9 kg)     Height 05/22/17 1112 4\' 11"  (1.499 m)     Head Circumference --      Peak Flow --      Pain Score 05/22/17 1111 7     Pain Loc --      Pain Edu? --      Excl. in [place]? --    Constitutional: Alert and oriented. Well appearing and in no acute distress.Mobid obesity. Cardiovascular: Normal rate, regular rhythm. Grossly normal heart sounds.  Good peripheral circulation. Elevated blood pressure Respiratory: Normal respiratory effort.  No retractions. Lungs CTAB. Gastrointestinal: Soft and nontender. No distention.  No abdominal bruits. No CVA tenderness. Musculoskeletal: No lower extremity tenderness nor edema.  No joint effusions. Neurologic:  Normal speech and language. No gross focal neurologic deficits are appreciated. No gait instability. Skin:  Skin is warm, dry and intact. No rash noted. Psychiatric: Mood and affect are normal. Speech and behavior are normal.  ____________________________________________   LABS (all labs ordered are listed, but only abnormal results are displayed)  Labs Reviewed  URINALYSIS, COMPLETE (UACMP) WITH MICROSCOPIC - Abnormal; Notable for the following components:      Result Value   Color, Urine YELLOW (*)    APPearance HAZY (*)    Squamous Epithelial / LPF 6-30 (*)    All other components within normal limits  POC URINE PREG, ED  POCT PREGNANCY, URINE   ____________________________________________  EKG   ____________________________________________  RADIOLOGY  US Pelvis Transvanginal Non-ob (tv Only)  Result Date: 05/22/2017 CLINICAL DATA:  Acute onset of right pelvic pain starting this morning EXAM: TRANSABDOMINAL AND TRANSVAGINAL ULTRASOUND OF PELVIS TECHNIQUE: Both transabdominal and transvaginal ultrasound examinations of the pelvis were performed. Transabdominal technique was performed for global imaging of the pelvis including uterus, ovaries, adnexal regions, and pelvic cul-de-sac. It was necessary to proceed with endovaginal exam following the transabdominal exam to visualize the uterus, endometrium and ovaries. COMPARISON:  01/30/2010 CT FINDINGS: Uterus Measurements: 13.8 x 8.3 x 8.4 cm. The uterus is slightly anteverted in appearance. There is a hypoechoic solid 6.7 x 5.2 x 5.4 cm leiomyoma associated with the fundus of the uterus, predominantly intramural in location with subserosal as well submucosal components also noted. No additional uterine mass is seen. Endometrium Thickness: 4 mm.  No focal abnormality visualized. Right ovary Measurements:  3.5 x 2.6 x 2.5 cm. Normal appearance/no adnexal mass. Left ovary Measurements: 4.2 x 2.4 x 2 cm. Normal appearance/no adnexal mass. Other findings No abnormal free fluid. IMPRESSION: 1. A 6.7 x 5.2 x 5.4 cm fundal leiomyoma is identified predominantly intramural in location with submucosal and subserosal components. 2. Unremarkable adnexa. Electronically Signed   By: Ashley Royalty M.D.   On: 05/22/2017 13:53   US Pelvis Complete  Result Date: 05/22/2017 CLINICAL DATA:  Acute onset of right pelvic pain starting this morning EXAM: TRANSABDOMINAL AND TRANSVAGINAL ULTRASOUND OF PELVIS TECHNIQUE: Both transabdominal and transvaginal ultrasound examinations of the pelvis were performed. Transabdominal technique was performed for global imaging of the pelvis including uterus, ovaries, adnexal regions, and pelvic cul-de-sac. It was necessary to proceed with endovaginal exam following the transabdominal exam to visualize the uterus, endometrium and ovaries. COMPARISON:  01/30/2010 CT FINDINGS: Uterus Measurements: 13.8 x 8.3 x 8.4 cm. The uterus is slightly anteverted in appearance. There is a hypoechoic solid 6.7 x 5.2 x 5.4 cm leiomyoma associated with the fundus of the uterus, predominantly intramural in location with subserosal as well submucosal components also noted.  No additional uterine mass is seen. Endometrium Thickness: 4 mm.  No focal abnormality visualized. Right ovary Measurements: 3.5 x 2.6 x 2.5 cm. Normal appearance/no adnexal mass. Left ovary Measurements: 4.2 x 2.4 x 2 cm. Normal appearance/no adnexal mass. Other findings No abnormal free fluid. IMPRESSION: 1. A 6.7 x 5.2 x 5.4 cm fundal leiomyoma is identified predominantly intramural in location with submucosal and subserosal components. 2. Unremarkable adnexa. Electronically Signed   By: Ashley Royalty M.D.   On: 05/22/2017 13:53    ____________________________________________   PROCEDURES  Procedure(s) performed: None  Procedures  Critical  Care performed: No  ____________________________________________   INITIAL IMPRESSION / ASSESSMENT AND PLAN / ED COURSE  As part of my medical decision making, I reviewed the following data within the electronic MEDICAL RECORD NUMBER    Pelvic pain secondary to uterine fibroid. Discussed ultrasound from the patient. Advised patient to follow-up on call for appointment with the GYN clinic tomorrow morning. Take pain medication as needed.      ____________________________________________   FINAL CLINICAL IMPRESSION(S) / ED DIAGNOSES  Final diagnoses:  Pelvic pain  Leiomyoma of body of uterus     ED Discharge Orders        Ordered    traMADol (ULTRAM) 50 MG tablet  Every 6 hours PRN     05/22/17 1422    naproxen (NAPROSYN) 500 MG tablet  2 times daily with meals     05/22/17 1422       Note:  This document was prepared using Dragon voice recognition software and may include unintentional dictation errors.    Sable Feil, PA-C 05/22/17 1428    Schuyler Amor, MD 05/22/17 [phone]

## 2017-05-22 NOTE — ED Notes (Signed)
Pt c/o lower abd pain since this am - c/o urinary frequency/urgency and pain with urination - also c/o feeling full when when she eats and not having normal bowel movements

## 2017-05-22 NOTE — Discharge Instructions (Signed)
Advised to follow-up with gynecologist by calling for an appointment tomorrow.

## 2017-05-23 ENCOUNTER — Ambulatory Visit (INDEPENDENT_AMBULATORY_CARE_PROVIDER_SITE_OTHER): Payer: Medicaid Other | Admitting: Obstetrics and Gynecology

## 2017-05-23 ENCOUNTER — Encounter: Payer: Self-pay | Admitting: Obstetrics and Gynecology

## 2017-05-23 VITALS — BP 142/70 | HR 110 | Ht 59.0 in | Wt 285.0 lb

## 2017-05-23 DIAGNOSIS — N939 Abnormal uterine and vaginal bleeding, unspecified: Secondary | ICD-10-CM

## 2017-05-23 DIAGNOSIS — D251 Intramural leiomyoma of uterus: Secondary | ICD-10-CM

## 2017-05-23 DIAGNOSIS — Z124 Encounter for screening for malignant neoplasm of cervix: Secondary | ICD-10-CM

## 2017-05-23 MED ORDER — MEDROXYPROGESTERONE ACETATE 10 MG PO TABS: 10.0000 mg | ORAL_TABLET | Freq: Every day | ORAL | 11 refills | Status: DC

## 2017-05-23 NOTE — Patient Instructions (Signed)
Abnormal Uterine Bleeding Abnormal uterine bleeding can affect women at various stages in life, including teenagers, women in their reproductive years, pregnant women, and women who have reached menopause. Several kinds of uterine bleeding are considered abnormal, including:  Bleeding or spotting between periods.  Bleeding after sexual intercourse.  Bleeding that is heavier or more than normal.  Periods that last longer than usual.  Bleeding after menopause. Many cases of abnormal uterine bleeding are minor and simple to treat, while others are more serious. Any type of abnormal bleeding should be evaluated by your health care provider. Treatment will depend on the cause of the bleeding. Follow these instructions at home: Monitor your condition for any changes. The following actions may help to alleviate any discomfort you are experiencing:  Avoid the use of tampons and douches as directed by your health care provider.  Change your pads frequently. You should get regular pelvic exams and Pap tests. Keep all follow-up appointments for diagnostic tests as directed by your health care provider. Contact a health care provider if:  Your bleeding lasts more than 1 week.  You feel dizzy at times. Get help right away if:  You pass out.  You are changing pads every 15 to 30 minutes.  You have abdominal pain.  You have a fever.  You become sweaty or weak.  You are passing large blood clots from the vagina.  You start to feel nauseous and vomit. This information is not intended to replace advice given to you by your health care provider. Make sure you discuss any questions you have with your health care provider. Document Released: 05/08/2005 Document Revised: 10/20/2015 Document Reviewed: 12/05/2012 [employer] Interactive Patient Education  2017 [employer]  

## 2017-05-23 NOTE — Progress Notes (Signed)
Gynecology Abnormal Uterine Bleeding Initial Evaluation   Chief Complaint:  Chief Complaint  Patient presents with  . ER follow up    Fibroid/Cyst    History of Present Illness:    Desiree Stone is a 42 y.o. [id number] who LMP was Patient's last menstrual period was 05/15/2017., presents today for a problem visit.  She complains of menorrhagia that  began a week ago and its severity is described as severe.  She has regular periods every 28-30 days and they are associated with severe menstrual cramping.  She has used the following for attempts at control: tampon and pad and depends. The patient is not currently sexually active. She currently uses None for contraception.   Previous evaluation: emergency room visit on 05/22/2017. Prior Diagnosis: uterine fibroids. Previous Treatment: none.  LMP: Patient's last menstrual period was 05/15/2017. Shortest Interval: 28 Longest Interval: 30  days Duration of flow: 5 days Heavy Menses: yes Clots: yes Intermenstrual Bleeding: no Postcoital Bleeding: not applicable Dysmenorrhea: no  The patient reports her periods have always been relatively heavy but recently have increased in flow and duration.  Now 5-6 days in length, previously 3 days.  She was did try an IUD after her last pregnancy but this was removed when it was out of position and she continued to have cramping and bleeding.  She presented to the ED on 05/22/2017 with evaluation at that time for pelvic pain showing a 6cm fundal fibroid with likely submucosal and subserosal component.  She was not previously aware of any fibroids, both of her deliveries were uncomplicated vaginal deliveries.  She states her pelvic pain is not just confined to menses.  She has not been sexually active in the past 9 years. No fibroids were noted on CT abdomen and pelvis on 01/30/2010 for LLQ pain with findings of colitis at that time.    Review of Systems: Review of Systems  Constitutional: Positive for fever.  Negative for chills, malaise/fatigue and weight loss.  HENT: Negative.   Eyes: Negative for blurred vision.  Respiratory: Negative for shortness of breath and wheezing.   Cardiovascular: Negative.   Gastrointestinal: Positive for constipation. Negative for abdominal pain and diarrhea.  Genitourinary: Negative for dysuria.  Musculoskeletal: Negative.   Skin: Negative.   Neurological: Negative for dizziness and headaches.  Endo/Heme/Allergies: Does not bruise/bleed easily.  Psychiatric/Behavioral: Negative.     Past Medical History:  Past Medical History:  Diagnosis Date  . Asthma   . Hypertension     Past Surgical History:  Past Surgical History:  Procedure Laterality Date  . carpel tunnel    . COLONOSCOPY WITH PROPOFOL N/A 12/08/2014   Procedure: COLONOSCOPY WITH PROPOFOL;  Surgeon: Lollie Sails, MD;  Location: [facility] ENDOSCOPY;  Service: Endoscopy;  Laterality: N/A;  . ESOPHAGOGASTRODUODENOSCOPY      Obstetric History: [id number]  Family History:  Family History  Problem Relation Age of Onset  . Breast cancer Paternal Aunt 80  . Cancer Paternal Grandmother        Colon    Social History:  Social History   Socioeconomic History  . Marital status: Single    Spouse name: Not on file  . Number of children: Not on file  . Years of education: Not on file  . Highest education level: Not on file  Social Needs  . Financial resource strain: Not on file  . Food insecurity - worry: Not on file  . Food insecurity - inability: Not on file  . Transportation  needs - medical: Not on file  . Transportation needs - non-medical: Not on file  Occupational History  . Not on file  Tobacco Use  . Smoking status: Former Smoker    Packs/day: 0.00    Types: Cigarettes    Last attempt to quit: 04/04/2017    Years since quitting: 0.1  . Smokeless tobacco: Never Used  Substance and Sexual Activity  . Alcohol use: No  . Drug use: No  . Sexual activity: Not Currently    Birth  control/protection: None  Other Topics Concern  . Not on file  Social History Narrative  . Not on file    Allergies:  Allergies  Allergen Reactions  . Abilify [Aripiprazole] Other (See Comments)    Syncope  . Effexor [Venlafaxine] Other (See Comments)    Hair Loss  . Wellbutrin [Bupropion] Other (See Comments)    Acne     Medications: Prior to Admission medications   Medication Sig Start Date End Date Taking? Authorizing Provider  albuterol (PROVENTIL HFA;VENTOLIN HFA) 108 (90 BASE) MCG/ACT inhaler Inhale into the lungs every 6 (six) hours as needed for wheezing or shortness of breath.   Yes [provider]  fluticasone (FLOVENT DISKUS) 50 MCG/BLIST diskus inhaler Inhale 1 puff into the lungs 2 (two) times daily.   Yes [provider]  traMADol (ULTRAM) 50 MG tablet Take 1 tablet (50 mg total) by mouth every 6 (six) hours as needed. 05/22/17 05/22/18 Yes Sable Feil, PA-C  varenicline (CHANTIX) 1 MG tablet Take 1 mg by mouth 2 (two) times daily.   Yes [provider]  enalapril (VASOTEC) 2.5 MG tablet Take 2.5 mg by mouth daily.    [provider]  fluconazole (DIFLUCAN) 150 MG tablet Take one now and one in a week Patient not taking: Reported on 05/23/2017 04/24/17   Caryn Section Linden Dolin, PA-C  meloxicam (MOBIC) 7.5 MG tablet Take 7.5 mg by mouth daily.    [provider]  naproxen (NAPROSYN) 500 MG tablet Take 1 tablet (500 mg total) by mouth 2 (two) times daily with a meal. Patient not taking: Reported on 05/23/2017 05/22/17   Sable Feil, PA-C    Physical Exam Vitals:  Vitals:   05/23/17 0951  BP: (!) 142/70  Pulse: (!) 110   Patient's last menstrual period was 05/15/2017.  General: NAD HEENT: normocephalic, anicteric Thyroid: no enlargement, no palpable nodules Pulmonary: No increased work of breathing Abdomen: Soft, non-tender, non-distended.  Umbilicus without lesions.  No hepatomegaly, splenomegaly or masses palpable. No  evidence of hernia  Genitourinary:  External: Normal external female genitalia.  Normal urethral meatus, normal  Bartholin's and Skene's glands.    Vagina: Normal vaginal mucosa, no evidence of prolapse.    Cervix: Grossly normal in appearance, no bleeding  Uterus: Non-enlarged, mobile, normal contour.  No CMT  Adnexa: ovaries non-enlarged, no adnexal masses  Rectal: deferred  Lymphatic: no evidence of inguinal lymphadenopathy Extremities: no edema, erythema, or tenderness Neurologic: Grossly intact Psychiatric: mood appropriate, affect full  Female chaperone present for pelvic portions of the physical exam    ENDOMETRIAL BIOPSY     The indications for endometrial biopsy were reviewed.   Risks of the biopsy including cramping, bleeding, infection, uterine perforation, inadequate specimen and need for additional procedures  were discussed. The patient states she understands and agrees to undergo procedure today. Consent was signed. Time out was performed.  A Graves speculum was placed and the cervix was brought into view.  The  cervix was prepped with Betadine. A single-toothed tenaculum was placed on the anterior lip of the cervix for traction. A 3 mm pipelle was introduced through the cervix into the endometrial cavity without difficulty to a depth of 11cm, and a large amount of tissue was obtained, the resulting specime sent to pathology. The instruments were removed from the patient's vagina. Minimal bleeding from the cervix was noted. The patient tolerated the procedure well. Routine post-procedure instructions were given to the patient.  She will be contacted by phone one results become available.     Imaging US Pelvis Transvanginal Non-ob (tv Only)  Result Date: 05/22/2017 CLINICAL DATA:  Acute onset of right pelvic pain starting this morning EXAM: TRANSABDOMINAL AND TRANSVAGINAL ULTRASOUND OF PELVIS TECHNIQUE: Both transabdominal and transvaginal ultrasound examinations of the pelvis  were performed. Transabdominal technique was performed for global imaging of the pelvis including uterus, ovaries, adnexal regions, and pelvic cul-de-sac. It was necessary to proceed with endovaginal exam following the transabdominal exam to visualize the uterus, endometrium and ovaries. COMPARISON:  01/30/2010 CT FINDINGS: Uterus Measurements: 13.8 x 8.3 x 8.4 cm. The uterus is slightly anteverted in appearance. There is a hypoechoic solid 6.7 x 5.2 x 5.4 cm leiomyoma associated with the fundus of the uterus, predominantly intramural in location with subserosal as well submucosal components also noted. No additional uterine mass is seen. Endometrium Thickness: 4 mm.  No focal abnormality visualized. Right ovary Measurements: 3.5 x 2.6 x 2.5 cm. Normal appearance/no adnexal mass. Left ovary Measurements: 4.2 x 2.4 x 2 cm. Normal appearance/no adnexal mass. Other findings No abnormal free fluid. IMPRESSION: 1. A 6.7 x 5.2 x 5.4 cm fundal leiomyoma is identified predominantly intramural in location with submucosal and subserosal components. 2. Unremarkable adnexa. Electronically Signed   By: Ashley Royalty M.D.   On: 05/22/2017 13:53   US Pelvis Complete  Result Date: 05/22/2017 CLINICAL DATA:  Acute onset of right pelvic pain starting this morning EXAM: TRANSABDOMINAL AND TRANSVAGINAL ULTRASOUND OF PELVIS TECHNIQUE: Both transabdominal and transvaginal ultrasound examinations of the pelvis were performed. Transabdominal technique was performed for global imaging of the pelvis including uterus, ovaries, adnexal regions, and pelvic cul-de-sac. It was necessary to proceed with endovaginal exam following the transabdominal exam to visualize the uterus, endometrium and ovaries. COMPARISON:  01/30/2010 CT FINDINGS: Uterus Measurements: 13.8 x 8.3 x 8.4 cm. The uterus is slightly anteverted in appearance. There is a hypoechoic solid 6.7 x 5.2 x 5.4 cm leiomyoma associated with the fundus of the uterus, predominantly  intramural in location with subserosal as well submucosal components also noted. No additional uterine mass is seen. Endometrium Thickness: 4 mm.  No focal abnormality visualized. Right ovary Measurements: 3.5 x 2.6 x 2.5 cm. Normal appearance/no adnexal mass. Left ovary Measurements: 4.2 x 2.4 x 2 cm. Normal appearance/no adnexal mass. Other findings No abnormal free fluid. IMPRESSION: 1. A 6.7 x 5.2 x 5.4 cm fundal leiomyoma is identified predominantly intramural in location with submucosal and subserosal components. 2. Unremarkable adnexa. Electronically Signed   By: Ashley Royalty M.D.   On: 05/22/2017 13:53    Assessment: 42 y.o. G2P0202 with abnormal uterine bleeding  Plan: Problem List Items Addressed This Visit    None    Visit Diagnoses    Abnormal uterine bleeding    -  Primary   Relevant Orders   Pathology Report   PapIG, HPV, rfx 16/18   CBC   TSH   Prolactin   Intramural uterine fibroid  Relevant Orders   Pathology Report   Ambulatory referral to Vascular Surgery   Cervical cancer screening       Relevant Orders   PapIG, HPV, rfx 16/18      1) Discussed management options for abnormal uterine bleeding including expectant, NSAIDs, tranexamic acid (Lysteda), oral progesterone (Provera, norethindrone, megace), Depo Provera, Levonorgestrel containing IUD, endometrial ablation (Novasure) or hysterectomy as definitive surgical management.  Discussed risks and benefits of each method.   Final management decision will hinge on results of patient's work up and whether an underlying etiology for the patients bleeding symptoms can be discerned.  We will conduct a basic work up using the PALM-COIEN classification system.  In the meantime the patient opts to trial provera while we await results of her ultrasound and labs.  The role of unopposed estrogen in the development of endometrial hyperplasia or carcinoma is discussed.  The risk of endometrial hyperplasia is linearly correlated  with increasing BMI given the production of estrone by adipose tissue. Printed patient education handouts were given to the patient to review at home.  Bleeding precautions reviewed.  - previously trialed Mirean - endometrial biopsy and pap today - [facility] as she is most interested in uterine artery embolization.  She is currently in school and does not want to miss any time. - had not been sexually active in last 9 years - both deliveries vaginal - menses went from 3 days to 5-6 having to wear depends  2) A total of 30 minutes were spent in face-to-face contact with the patient during this encounter with over half of that time devoted to counseling and coordination of care.  I independently reviewed her ER imaging and concur with the findings.   Malachy Mood, MD, [facility], [facility]

## 2017-05-24 LAB — CBC
HEMOGLOBIN: 11.5 g/dL (ref 11.1–15.9)
Hematocrit: 35.9 % (ref 34.0–46.6)
MCH: 23 pg — AB (ref 26.6–33.0)
MCHC: 32 g/dL (ref 31.5–35.7)
MCV: 72 fL — AB (ref 79–97)
PLATELETS: 327 10*3/uL (ref 150–379)
RBC: 4.99 x10E6/uL (ref 3.77–5.28)
RDW: 17.9 % — ABNORMAL HIGH (ref 12.3–15.4)
WBC: 9.7 10*3/uL (ref 3.4–10.8)

## 2017-05-24 LAB — TSH: TSH: 1.17 u[IU]/mL (ref 0.450–4.500)

## 2017-05-24 LAB — PROLACTIN: Prolactin: 10.1 ng/mL (ref 4.8–23.3)

## 2017-05-25 LAB — PAPIG, HPV, RFX 16/18
HPV, HIGH-RISK: NEGATIVE
PAP SMEAR COMMENT: 0

## 2017-05-28 LAB — PATHOLOGY

## 2017-05-29 ENCOUNTER — Other Ambulatory Visit: Payer: Self-pay | Admitting: Obstetrics and Gynecology

## 2017-05-29 MED ORDER — DOXYCYCLINE HYCLATE 100 MG PO CAPS: 100.0000 mg | ORAL_CAPSULE | Freq: Two times a day (BID) | ORAL | 0 refills | Status: DC

## 2017-05-29 NOTE — Progress Notes (Signed)
Endometrial biopsy with chronic cervicitis 10 day course of doxycline called in

## 2017-06-01 ENCOUNTER — Telehealth: Payer: Self-pay

## 2017-06-01 NOTE — Telephone Encounter (Signed)
Pt calling to confirm how to take provera before she reaches the 14d mark.  Is confused about having 30 tablets and refills. [phone]

## 2017-06-05 ENCOUNTER — Telehealth: Payer: Self-pay | Admitting: Obstetrics and Gynecology

## 2017-06-05 NOTE — Telephone Encounter (Signed)
Desiree Stone from [facility] called today regarding patient, states they cannot see her b/c she has family planning Medicaid and not able to pay out of pocket.  They have contacted the patient to let her know as well.

## 2017-07-04 ENCOUNTER — Ambulatory Visit (INDEPENDENT_AMBULATORY_CARE_PROVIDER_SITE_OTHER): Payer: Self-pay | Admitting: Obstetrics and Gynecology

## 2017-07-04 ENCOUNTER — Encounter: Payer: Self-pay | Admitting: Obstetrics and Gynecology

## 2017-07-04 VITALS — BP 136/80 | HR 104 | Wt 297.0 lb

## 2017-07-04 DIAGNOSIS — D251 Intramural leiomyoma of uterus: Secondary | ICD-10-CM

## 2017-07-04 DIAGNOSIS — N920 Excessive and frequent menstruation with regular cycle: Secondary | ICD-10-CM

## 2017-07-04 MED ORDER — MEDROXYPROGESTERONE ACETATE 10 MG PO TABS: 10.0000 mg | ORAL_TABLET | Freq: Every day | ORAL | 11 refills | Status: DC

## 2017-07-04 NOTE — Progress Notes (Signed)
Obstetrics & Gynecology Office Visit   Chief Complaint:  Chief Complaint  Patient presents with  . Medication follow up    History of Present Illness: 42 year old with menorrhagia, 6cm fundal fibroid, and endometrial biopsy showing chronic endometritis.  Patient was started on po provera and well as doxycycline for the chronic endometritis.  She has had some continued spotting but no heavy bleeding since her last visit.  She does feel like she has experience some weight gain on provera.  She was initially interested in uterine artery embolization but did not have insurance coverage with family planning medicaid.  She is now interested in pursuing a hysterectomy.  We spent time discussing the risk of hysterectomy given her BMI as well as her respiratory issues (OSA, smoker with recent cessation in November, and at least some reactive airway disease based on her med list).  She is likely to experience at least some difficulty with ventilation and intubation.     Review of Systems: Review of Systems  Constitutional: Negative for chills, fever and weight loss.  Respiratory: Positive for shortness of breath and wheezing.   Gastrointestinal: Negative for abdominal pain.  Neurological: Negative for dizziness.  Endo/Heme/Allergies: Does not bruise/bleed easily.     Past Medical History:  Past Medical History:  Diagnosis Date  . Asthma   . Hypertension     Past Surgical History:  Past Surgical History:  Procedure Laterality Date  . carpel tunnel    . COLONOSCOPY WITH PROPOFOL N/A 12/08/2014   Procedure: COLONOSCOPY WITH PROPOFOL;  Surgeon: Lollie Sails, MD;  Location: [facility] ENDOSCOPY;  Service: Endoscopy;  Laterality: N/A;  . ESOPHAGOGASTRODUODENOSCOPY      Gynecologic History: No LMP recorded. Pap 05/23/2017 NIL HPV negative Endometrial biopsy 05/23/2017 proliferative endometrium with chronic endometritis   Obstetric History: G2P0202  Family History:  Family History    Problem Relation Age of Onset  . Breast cancer Paternal Aunt 57  . Cancer Paternal Grandmother        Colon    Social History:  Social History   Socioeconomic History  . Marital status: Single    Spouse name: Not on file  . Number of children: Not on file  . Years of education: Not on file  . Highest education level: Not on file  Social Needs  . Financial resource strain: Not on file  . Food insecurity - worry: Not on file  . Food insecurity - inability: Not on file  . Transportation needs - medical: Not on file  . Transportation needs - non-medical: Not on file  Occupational History  . Not on file  Tobacco Use  . Smoking status: Former Smoker    Packs/day: 0.00    Types: Cigarettes    Last attempt to quit: 04/04/2017    Years since quitting: 0.2  . Smokeless tobacco: Never Used  Substance and Sexual Activity  . Alcohol use: No  . Drug use: No  . Sexual activity: Not Currently    Birth control/protection: None  Other Topics Concern  . Not on file  Social History Narrative  . Not on file    Allergies:  Allergies  Allergen Reactions  . Abilify [Aripiprazole] Other (See Comments)    Syncope  . Effexor [Venlafaxine] Other (See Comments)    Hair Loss  . Wellbutrin [Bupropion] Other (See Comments)    Acne     Medications: Prior to Admission medications   Medication Sig Start Date End Date Taking? Authorizing  Provider  albuterol (PROVENTIL HFA;VENTOLIN HFA) 108 (90 BASE) MCG/ACT inhaler Inhale into the lungs every 6 (six) hours as needed for wheezing or shortness of breath.    [provider]  doxycycline (VIBRAMYCIN) 100 MG capsule Take 1 capsule (100 mg total) by mouth 2 (two) times daily. 05/29/17   Malachy Mood, MD  enalapril (VASOTEC) 2.5 MG tablet Take 2.5 mg by mouth daily.    [provider]  fluconazole (DIFLUCAN) 150 MG tablet Take one now and one in a week Patient not taking: Reported on 05/23/2017 04/24/17   Versie Starks, PA-C   fluticasone (FLOVENT DISKUS) 50 MCG/BLIST diskus inhaler Inhale 1 puff into the lungs 2 (two) times daily.    [provider]  medroxyPROGESTERone (PROVERA) 10 MG tablet Take 1 tablet (10 mg total) by mouth daily for 14 days. 05/23/17 06/06/17  Malachy Mood, MD  meloxicam (MOBIC) 7.5 MG tablet Take 7.5 mg by mouth daily.    [provider]  naproxen (NAPROSYN) 500 MG tablet Take 1 tablet (500 mg total) by mouth 2 (two) times daily with a meal. Patient not taking: Reported on 05/23/2017 05/22/17   Sable Feil, PA-C  traMADol (ULTRAM) 50 MG tablet Take 1 tablet (50 mg total) by mouth every 6 (six) hours as needed. 05/22/17 05/22/18  Sable Feil, PA-C  varenicline (CHANTIX) 1 MG tablet Take 1 mg by mouth 2 (two) times daily.    [provider]    Physical Exam Vitals:  Vitals:   07/04/17 1606  BP: 136/80  Pulse: (!) 104   No LMP recorded.  General: NAD HEENT: normocephalic, anicteric Pulmonary: No increased work of breathing Extremities: no edema, erythema, or tenderness Neurologic: Grossly intact Psychiatric: mood appropriate, affect full  CBC Latest Ref Rng & Units 05/23/2017  WBC 3.4 - 10.8 x10E3/uL 9.7  Hemoglobin 11.1 - 15.9 g/dL 11.5  Hematocrit 34.0 - 46.6 % 35.9  Platelets 150 - 379 x10E3/uL 327    Imaging: US Pelvis Transvanginal Non-ob (tv Only)  Result Date: 05/22/2017 CLINICAL DATA:  Acute onset of right pelvic pain starting this morning EXAM: TRANSABDOMINAL AND TRANSVAGINAL ULTRASOUND OF PELVIS TECHNIQUE: Both transabdominal and transvaginal ultrasound examinations of the pelvis were performed. Transabdominal technique was performed for global imaging of the pelvis including uterus, ovaries, adnexal regions, and pelvic cul-de-sac. It was necessary to proceed with endovaginal exam following the transabdominal exam to visualize the uterus, endometrium and ovaries. COMPARISON:  01/30/2010 CT FINDINGS: Uterus Measurements: 13.8 x 8.3 x 8.4 cm. The  uterus is slightly anteverted in appearance. There is a hypoechoic solid 6.7 x 5.2 x 5.4 cm leiomyoma associated with the fundus of the uterus, predominantly intramural in location with subserosal as well submucosal components also noted. No additional uterine mass is seen. Endometrium Thickness: 4 mm.  No focal abnormality visualized. Right ovary Measurements: 3.5 x 2.6 x 2.5 cm. Normal appearance/no adnexal mass. Left ovary Measurements: 4.2 x 2.4 x 2 cm. Normal appearance/no adnexal mass. Other findings No abnormal free fluid. IMPRESSION: 1. A 6.7 x 5.2 x 5.4 cm fundal leiomyoma is identified predominantly intramural in location with submucosal and subserosal components. 2. Unremarkable adnexa. Electronically Signed   By: Ashley Royalty M.D.   On: 05/22/2017 13:53   US Pelvis Complete  Result Date: 05/22/2017 CLINICAL DATA:  Acute onset of right pelvic pain starting this morning EXAM: TRANSABDOMINAL AND TRANSVAGINAL ULTRASOUND OF PELVIS TECHNIQUE: Both transabdominal and transvaginal ultrasound examinations of the pelvis were performed. Transabdominal technique was  performed for global imaging of the pelvis including uterus, ovaries, adnexal regions, and pelvic cul-de-sac. It was necessary to proceed with endovaginal exam following the transabdominal exam to visualize the uterus, endometrium and ovaries. COMPARISON:  01/30/2010 CT FINDINGS: Uterus Measurements: 13.8 x 8.3 x 8.4 cm. The uterus is slightly anteverted in appearance. There is a hypoechoic solid 6.7 x 5.2 x 5.4 cm leiomyoma associated with the fundus of the uterus, predominantly intramural in location with subserosal as well submucosal components also noted. No additional uterine mass is seen. Endometrium Thickness: 4 mm.  No focal abnormality visualized. Right ovary Measurements: 3.5 x 2.6 x 2.5 cm. Normal appearance/no adnexal mass. Left ovary Measurements: 4.2 x 2.4 x 2 cm. Normal appearance/no adnexal mass. Other findings No abnormal free fluid.  IMPRESSION: 1. A 6.7 x 5.2 x 5.4 cm fundal leiomyoma is identified predominantly intramural in location with submucosal and subserosal components. 2. Unremarkable adnexa. Electronically Signed   By: Ashley Royalty M.D.   On: 05/22/2017 13:53    Assessment: 42 y.o. G2P0202 with AUB-L and 6cm intramural fibroid  Plan: Problem List Items Addressed This Visit    None    Visit Diagnoses    Intramural leiomyoma of uterus    -  Primary   Menorrhagia with regular cycle         - Referral to [facility] for consideration of surgical management vs [place].  Symptoms are currently improved on provera.  She had a normal endometrial biopsy (proliferative endometrium chronic endometritis) and pap smear on 05/24/07.  I would still think given her co-morbidities she is best served with a minimally invasive approach.  However, if she is to undergo surgical management via hysterectomy she is probably best served at a tertiary care cent - Refill provera -A total of 15 minutes were spent in face-to-face contact with the patient during this encounter with over half of that time devoted to counseling and coordination of care.   Malachy Mood, MD, [facility], [facility] 07/04/2017, 5:10 PM

## 2017-07-10 ENCOUNTER — Other Ambulatory Visit: Payer: Self-pay

## 2017-09-10 ENCOUNTER — Emergency Department: Payer: Medicaid Other

## 2017-09-10 ENCOUNTER — Encounter: Payer: Self-pay | Admitting: Emergency Medicine

## 2017-09-10 ENCOUNTER — Emergency Department
Admission: EM | Admit: 2017-09-10 | Discharge: 2017-09-10 | Disposition: A | Discharge status: Home / Self Care | Payer: Medicaid Other | Attending: Emergency Medicine | Admitting: Emergency Medicine

## 2017-09-10 ENCOUNTER — Other Ambulatory Visit: Payer: Self-pay

## 2017-09-10 DIAGNOSIS — J45909 Unspecified asthma, uncomplicated: Secondary | ICD-10-CM | POA: Diagnosis not present

## 2017-09-10 DIAGNOSIS — Z87891 Personal history of nicotine dependence: Secondary | ICD-10-CM | POA: Diagnosis not present

## 2017-09-10 DIAGNOSIS — R0981 Nasal congestion: Secondary | ICD-10-CM | POA: Diagnosis not present

## 2017-09-10 DIAGNOSIS — Z79899 Other long term (current) drug therapy: Secondary | ICD-10-CM | POA: Diagnosis not present

## 2017-09-10 DIAGNOSIS — J020 Streptococcal pharyngitis: Secondary | ICD-10-CM | POA: Diagnosis not present

## 2017-09-10 DIAGNOSIS — E119 Type 2 diabetes mellitus without complications: Secondary | ICD-10-CM | POA: Insufficient documentation

## 2017-09-10 DIAGNOSIS — I10 Essential (primary) hypertension: Secondary | ICD-10-CM | POA: Insufficient documentation

## 2017-09-10 DIAGNOSIS — R070 Pain in throat: Secondary | ICD-10-CM | POA: Diagnosis present

## 2017-09-10 LAB — COMPREHENSIVE METABOLIC PANEL
ALBUMIN: 4.1 g/dL (ref 3.5–5.0)
ALT: 23 U/L (ref 14–54)
AST: 21 U/L (ref 15–41)
Alkaline Phosphatase: 88 U/L (ref 38–126)
Anion gap: 10 (ref 5–15)
BUN: 12 mg/dL (ref 6–20)
CHLORIDE: 97 mmol/L — AB (ref 101–111)
CO2: 25 mmol/L (ref 22–32)
Calcium: 9.2 mg/dL (ref 8.9–10.3)
Creatinine, Ser: 0.63 mg/dL (ref 0.44–1.00)
GFR calc Af Amer: >60 mL/min (ref >60)
GFR calc non Af Amer: >60 mL/min (ref >60)
GLUCOSE: 111 mg/dL — AB (ref 65–99)
POTASSIUM: 4.1 mmol/L (ref 3.5–5.1)
SODIUM: 132 mmol/L — AB (ref 135–145)
Total Bilirubin: 0.3 mg/dL (ref 0.3–1.2)
Total Protein: 8.2 g/dL — ABNORMAL HIGH (ref 6.5–8.1)

## 2017-09-10 LAB — CBC WITH DIFFERENTIAL/PLATELET
Basophils Absolute: 0.1 10*3/uL (ref 0–0.1)
Basophils Relative: 0 %
EOS PCT: 0 %
Eosinophils Absolute: 0 10*3/uL (ref 0–0.7)
HCT: 39.1 % (ref 35.0–47.0)
Hemoglobin: 12.5 g/dL (ref 12.0–16.0)
LYMPHS ABS: 2.7 10*3/uL (ref 1.0–3.6)
LYMPHS PCT: 14 %
MCH: 23.6 pg — AB (ref 26.0–34.0)
MCHC: 31.9 g/dL — ABNORMAL LOW (ref 32.0–36.0)
MCV: 74 fL — AB (ref 80.0–100.0)
MONO ABS: 1.7 10*3/uL — AB (ref 0.2–0.9)
Monocytes Relative: 9 %
Neutro Abs: 15 10*3/uL — ABNORMAL HIGH (ref 1.4–6.5)
Neutrophils Relative %: 77 %
PLATELETS: 337 10*3/uL (ref 150–440)
RBC: 5.28 MIL/uL — AB (ref 3.80–5.20)
RDW: 16.2 % — ABNORMAL HIGH (ref 11.5–14.5)
WBC: 19.4 10*3/uL — AB (ref 3.6–11.0)

## 2017-09-10 LAB — LACTIC ACID, PLASMA: Lactic Acid, Venous: 1.8 mmol/L (ref 0.5–1.9)

## 2017-09-10 LAB — GROUP A STREP BY PCR: GROUP A STREP BY PCR: DETECTED — AB

## 2017-09-10 MED ORDER — SODIUM CHLORIDE 0.9 % IV BOLUS
1000.0000 mL | Freq: Once | INTRAVENOUS | Status: AC
  Administered 2017-09-10: 1000 mL via INTRAVENOUS

## 2017-09-10 MED ORDER — AMOXICILLIN 875 MG PO TABS: 875.0000 mg | ORAL_TABLET | Freq: Two times a day (BID) | ORAL | 0 refills | Status: DC

## 2017-09-10 MED ORDER — DEXAMETHASONE SODIUM PHOSPHATE 10 MG/ML IJ SOLN
10.0000 mg | Freq: Once | INTRAMUSCULAR | Status: AC
  Administered 2017-09-10: 10 mg via INTRAVENOUS
  Filled 2017-09-10: qty 1

## 2017-09-10 MED ORDER — IOPAMIDOL (ISOVUE-300) INJECTION 61%
75.0000 mL | Freq: Once | INTRAVENOUS | Status: AC | PRN
  Administered 2017-09-10: 75 mL via INTRAVENOUS
  Filled 2017-09-10: qty 75

## 2017-09-10 MED ORDER — MAGIC MOUTHWASH W/LIDOCAINE: 5.0000 mL | Freq: Four times a day (QID) | ORAL | 0 refills | Status: DC

## 2017-09-10 MED ORDER — AMOXICILLIN 500 MG PO CAPS
1000.0000 mg | ORAL_CAPSULE | Freq: Once | ORAL | Status: AC
  Administered 2017-09-10: 1000 mg via ORAL
  Filled 2017-09-10: qty 2

## 2017-09-10 NOTE — ED Triage Notes (Signed)
Sore throat. Onset this morning.  Also fells like ears bilaterally are clogged.

## 2017-09-10 NOTE — ED Provider Notes (Signed)
[facility] Emergency Department Provider Note  ____________________________________________  Time seen: Approximately 5:24 PM  I have reviewed the triage vital signs and the nursing notes.   HISTORY  Chief Complaint Sore Throat    HPI Desiree Stone is a 42 y.o. female who presents the emergency department complaining of sharp sore throat, pain/difficulty with swallowing, voice changes.  Patient reports that through the night, she was waking up multiple times with increasing sore throat.  Patient reports that it is a sharp sensation.  She denies it being on lateralized to one side versus the other.  Patient reports that the pain has increased to the point where it is difficult to swallow but not true dysphagia.  Patient also reports her voice changing to a muffled sound.  This is observed while performing the interview with patient.  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  Patient denies any difficulty breathing.  She denies any fevers or chills.  She endorses some mild nasal congestion which she attributes to her ongoing seasonal allergies.  Patient denies any cough, chest pain, shortness of breath, abdominal pain, nausea or vomiting.  No medications for this complaint prior to arrival.   Past Medical History:  Diagnosis Date  . Asthma   . Diabetes mellitus without complication ([place])   . Hypertension     Patient Active Problem List   Diagnosis Date Noted  . Obstructive sleep apnea 06/14/2016  . Smoking 06/14/2016  . Class 3 obesity in adult 04/12/2016  . Insomnia disorder 04/12/2016  . Family history of polyps in the colon 11/12/2014    Past Surgical History:  Procedure Laterality Date  . carpel tunnel    . COLONOSCOPY WITH PROPOFOL N/A 12/08/2014   Procedure: COLONOSCOPY WITH PROPOFOL;  Surgeon: Lollie Sails, MD;  Location: [facility] ENDOSCOPY;  Service:  Endoscopy;  Laterality: N/A;  . ESOPHAGOGASTRODUODENOSCOPY      Prior to Admission medications   Medication Sig Start Date End Date Taking? Authorizing Provider  albuterol (PROVENTIL HFA;VENTOLIN HFA) 108 (90 BASE) MCG/ACT inhaler Inhale into the lungs every 6 (six) hours as needed for wheezing or shortness of breath.    [provider]  amoxicillin (AMOXIL) 875 MG tablet Take 1 tablet (875 mg total) by mouth 2 (two) times daily. 09/10/17   Kealey Kemmer, Charline Bills, PA-C  doxycycline (VIBRAMYCIN) 100 MG capsule Take 1 capsule (100 mg total) by mouth 2 (two) times daily. 05/29/17   Malachy Mood, MD  enalapril (VASOTEC) 2.5 MG tablet Take 2.5 mg by mouth daily.    [provider]  fluconazole (DIFLUCAN) 150 MG tablet Take one now and one in a week Patient not taking: Reported on 05/23/2017 04/24/17   Versie Starks, PA-C  fluticasone (FLOVENT DISKUS) 50 MCG/BLIST diskus inhaler Inhale 1 puff into the lungs 2 (two) times daily.    [provider]  magic mouthwash w/lidocaine SOLN Take 5 mLs by mouth 4 (four) times daily. 09/10/17   Alycea Segoviano, Charline Bills, PA-C  medroxyPROGESTERone (PROVERA) 10 MG tablet Take 1 tablet (10 mg total) by mouth daily. 07/04/17   Malachy Mood, MD  meloxicam (MOBIC) 7.5 MG tablet Take 7.5 mg by mouth daily.    [provider]  naproxen (NAPROSYN) 500 MG tablet Take 1 tablet (500 mg total) by mouth 2 (two) times daily with a meal. Patient not taking: Reported on 05/23/2017 05/22/17   Tamala Julian,  Hinda Lenis, PA-C  traMADol (ULTRAM) 50 MG tablet Take 1 tablet (50 mg total) by mouth every 6 (six) hours as needed. 05/22/17 05/22/18  Sable Feil, PA-C  varenicline (CHANTIX) 1 MG tablet Take 1 mg by mouth 2 (two) times daily.    [provider]    Allergies Abilify [aripiprazole]; Effexor [venlafaxine]; and Wellbutrin [bupropion]  Family History  Problem Relation Age of Onset  . Breast cancer Paternal Aunt 23  . Cancer Paternal Grandmother         Colon    Social History Social History   Tobacco Use  . Smoking status: Former Smoker    Packs/day: 0.00    Types: Cigarettes    Last attempt to quit: 04/04/2017    Years since quitting: 0.4  . Smokeless tobacco: Never Used  Substance Use Topics  . Alcohol use: No  . Drug use: No     Review of Systems  Constitutional: No fever/chills Eyes: No visual changes. No discharge ENT: Positive for sharp sore throat.  Voice changes.  Ongoing nasal congestion from reported seasonal allergies. Cardiovascular: no chest pain. Respiratory: no cough. No SOB. Gastrointestinal: No abdominal pain.  No nausea, no vomiting.  No diarrhea.  No constipation. Musculoskeletal: Negative for musculoskeletal pain. Skin: Negative for rash, abrasions, lacerations, ecchymosis. Neurological: Negative for headaches, focal weakness or numbness. 10-point ROS otherwise negative.  ____________________________________________   PHYSICAL EXAM:  VITAL SIGNS: ED Triage Vitals  Enc Vitals Group     BP 09/10/17 1656 136/85     Pulse Rate 09/10/17 1656 (!) 113     Resp 09/10/17 1656 16     Temp 09/10/17 1656 99.6 F (37.6 C)     Temp Source 09/10/17 1656 Oral     SpO2 09/10/17 1656 99 %     Weight 09/10/17 1654 (!) 305 lb (138.3 kg)     Height 09/10/17 1654 4\' 11"  (1.499 m)     Head Circumference --      Peak Flow --      Pain Score 09/10/17 1654 8     Pain Loc --      Pain Edu? --      Excl. in [place]? --      Constitutional: Alert and oriented. Well appearing and in no acute distress. Eyes: Conjunctivae are normal. PERRL. EOMI. Head: Atraumatic. ENT:      Ears: EACs with no significant abnormality.  TMs are mildly bulging bilaterally.      Nose: Moderate clear congestion/rhinnorhea.  Minutes are boggy      Mouth/Throat: Mucous membranes are moist.  Visualization reveals erythematous and edematous tonsils bilaterally.  Tonsil on the left is grossly larger than on tonsils and right.  Uvula is  deviated.  Patient with muffled voice while speaking. Neck: No stridor.  Neck is supple full range of motion Hematological/Lymphatic/Immunilogical: Scattered, nontender anterior cervical lymphadenopathy. Cardiovascular: Normal rate, regular rhythm. Normal S1 and S2.  Good peripheral circulation. Respiratory: Normal respiratory effort without tachypnea or retractions. Lungs CTAB. Good air entry to the bases with no decreased or absent breath sounds. Musculoskeletal: Full range of motion to all extremities. No gross deformities appreciated. Neurologic:  Normal speech and language. No gross focal neurologic deficits are appreciated.  Skin:  Skin is warm, dry and intact. No rash noted. Psychiatric: Mood and affect are normal. Speech and behavior are normal. Patient exhibits appropriate insight and judgement.   ____________________________________________   LABS (all labs ordered are listed, but only abnormal results are  displayed)  Labs Reviewed  GROUP A STREP BY PCR - Abnormal; Notable for the following components:      Result Value   Group A Strep by PCR DETECTED (*)    All other components within normal limits  COMPREHENSIVE METABOLIC PANEL - Abnormal; Notable for the following components:   Sodium 132 (*)    Chloride 97 (*)    Glucose, Bld 111 (*)    Total Protein 8.2 (*)    All other components within normal limits  CBC WITH DIFFERENTIAL/PLATELET - Abnormal; Notable for the following components:   WBC 19.4 (*)    RBC 5.28 (*)    MCV 74.0 (*)    MCH 23.6 (*)    MCHC 31.9 (*)    RDW 16.2 (*)    Neutro Abs 15.0 (*)    Monocytes Absolute 1.7 (*)    All other components within normal limits  CULTURE, BLOOD (ROUTINE X 2)  CULTURE, BLOOD (ROUTINE X 2)  LACTIC ACID, PLASMA   ____________________________________________  EKG   ____________________________________________  RADIOLOGY Diamantina Providence Cassi Jenne, personally viewed and evaluated these images (plain radiographs) as  part of my medical decision making, as well as reviewing the written report by the radiologist.  Ct Soft Tissue Neck W Contrast  Result Date: 09/10/2017 CLINICAL DATA:  Initial evaluation for acute sore throat, left-sided tonsil larger than the right. EXAM: CT NECK WITH CONTRAST TECHNIQUE: Multidetector CT imaging of the neck was performed using the standard protocol following the bolus administration of intravenous contrast. CONTRAST:  72mL ISOVUE-300 IOPAMIDOL (ISOVUE-300) INJECTION 61% COMPARISON:  None. FINDINGS: Pharynx and larynx: Oral cavity within normal limits without mass lesion or loculated fluid collection. No acute abnormality about the dentition. Palatine tonsils are somewhat prominent bilaterally which could reflect sequelae of acute mild and/or early tonsillitis. No discrete tonsillar or peritonsillar abscess. Adenoidal soft tissues prominent as well. Associated mild edema within the adjacent oropharynx, slightly more prominent on the left, suspicious for associated mild pharyngitis. No discrete retropharyngeal fluid collection. Epiglottis within normal limits. Lingual tonsils largely filled by the vallecula. Small amount of layering secretions noted within the hypopharynx. Remainder of the hypopharynx and supraglottic larynx otherwise within normal limits. True cords symmetric and normal. Subglottic airway clear. Salivary glands: Salivary glands including the parotid and submandibular glands are within normal limits. Thyroid: Thyroid post normal, although evaluation somewhat limited by body habitus. Lymph nodes: Prominent bilateral level 2 lymph nodes measure up to approximately 19 mm bilaterally, which may be reactive. No other pathologically enlarged lymph nodes identified within the neck. Vascular: Normal intravascular enhancement seen throughout the neck. Carotid arteries are medialized into the retropharyngeal space bilaterally. Limited intracranial: Small calcification noted along the left  sigmoid sinus. Otherwise unremarkable. Visualized orbits: Visualized globes and orbital soft tissues are within normal limits. Mastoids and visualized paranasal sinuses: Visualized paranasal sinuses are clear. Small right mastoid effusion. Middle ear cavities are clear. Skeleton: No acute osseus abnormality. No worrisome lytic or blastic osseous lesions. Upper chest: Visualized upper chest demonstrates no acute abnormality. Partially visualized lungs are clear. Other: None. IMPRESSION: 1. Mild prominence of the palatine tonsils bilaterally with mild mucosal edema within the adjacent oropharynx. Findings suggestive of possible mild and/or early acute tonsillitis/pharyngitis. No discrete tonsillar or peritonsillar abscess. 2. Mildly enlarged bilateral level II lymph nodes, likely reactive. Electronically Signed   By: Jeannine Boga M.D.   On: 09/10/2017 20:17    ____________________________________________    PROCEDURES  Procedure(s) performed:    Procedures  Medications  sodium chloride 0.9 % bolus 1,000 mL (1,000 mLs Intravenous New Bag/Given 09/10/17 1940)  dexamethasone (DECADRON) injection 10 mg (10 mg Intravenous Given 09/10/17 1941)  iopamidol (ISOVUE-300) 61 % injection 75 mL (75 mLs Intravenous Contrast Given 09/10/17 1853)  amoxicillin (AMOXIL) capsule 1,000 mg (1,000 mg Oral Given 09/10/17 2058)     ____________________________________________   INITIAL IMPRESSION / ASSESSMENT AND PLAN / ED COURSE  Pertinent labs & imaging results that were available during my care of the patient were reviewed by me and considered in my medical decision making (see chart for details).  Review of the [place] CSRS was performed in accordance of the [place] prior to dispensing any controlled drugs.     Patient's diagnosis is consistent with strep throat.  Patient presents emergency department with sharp sore throat.  Patient has had a history of strep infections and stated that it felt similar.   Patient did have voice changes, left tonsil was more edematous than right.  Patient had uvula deviation.  As such, differential included pharyngitis, strep throat, peritonsillar abscess, viral URI.  Labs and imaging were obtained.  Patient was positive for strep by testing.  CT reveals no peritonsillar abscess.  As such, patient will be discharged with amoxicillin and Magic mouthwash..  Due to repeated strep infections every year, patient is advised to follow-up with ENT surgery for discussion of tonsillectomy.  Patient is given ED precautions to return to the ED for any worsening or new symptoms.     ____________________________________________  FINAL CLINICAL IMPRESSION(S) / ED DIAGNOSES  Final diagnoses:  Strep throat      NEW MEDICATIONS STARTED DURING THIS VISIT:  ED Discharge Orders        Ordered    amoxicillin (AMOXIL) 875 MG tablet  2 times daily     09/10/17 2050    magic mouthwash w/lidocaine SOLN  4 times daily    Note to Pharmacy:  Dispense in a 1/1/1 ratio. Use lidocaine, diphenhydramine, prednisolone   09/10/17 2050          This chart was dictated using voice recognition software/Dragon. Despite best efforts to proofread, errors can occur which can change the meaning. Any change was purely unintentional.    Darletta Moll, PA-C 09/10/17 2112    Schuyler Amor, MD 09/13/17 2209

## 2017-09-10 NOTE — ED Notes (Signed)

## 2017-09-15 LAB — CULTURE, BLOOD (ROUTINE X 2)
CULTURE: NO GROWTH
Culture: NO GROWTH
Special Requests: ADEQUATE

## 2018-01-14 ENCOUNTER — Encounter: Payer: Self-pay | Admitting: Emergency Medicine

## 2018-01-14 ENCOUNTER — Emergency Department
Admission: EM | Admit: 2018-01-14 | Discharge: 2018-01-14 | Disposition: A | Discharge status: Home / Self Care | Payer: Medicaid Other | Attending: Emergency Medicine | Admitting: Emergency Medicine

## 2018-01-14 ENCOUNTER — Other Ambulatory Visit: Payer: Self-pay

## 2018-01-14 DIAGNOSIS — Z87891 Personal history of nicotine dependence: Secondary | ICD-10-CM | POA: Diagnosis not present

## 2018-01-14 DIAGNOSIS — I10 Essential (primary) hypertension: Secondary | ICD-10-CM | POA: Diagnosis not present

## 2018-01-14 DIAGNOSIS — Z79899 Other long term (current) drug therapy: Secondary | ICD-10-CM | POA: Diagnosis not present

## 2018-01-14 DIAGNOSIS — J45909 Unspecified asthma, uncomplicated: Secondary | ICD-10-CM | POA: Diagnosis not present

## 2018-01-14 DIAGNOSIS — J029 Acute pharyngitis, unspecified: Secondary | ICD-10-CM | POA: Diagnosis present

## 2018-01-14 DIAGNOSIS — J039 Acute tonsillitis, unspecified: Secondary | ICD-10-CM | POA: Diagnosis not present

## 2018-01-14 DIAGNOSIS — E119 Type 2 diabetes mellitus without complications: Secondary | ICD-10-CM | POA: Insufficient documentation

## 2018-01-14 MED ORDER — PSEUDOEPH-BROMPHEN-DM 30-2-10 MG/5ML PO SYRP: 5.0000 mL | ORAL_SOLUTION | Freq: Four times a day (QID) | ORAL | 0 refills | Status: DC | PRN

## 2018-01-14 MED ORDER — LIDOCAINE VISCOUS HCL 2 % MT SOLN: 5.0000 mL | Freq: Four times a day (QID) | OROMUCOSAL | 0 refills | Status: DC | PRN

## 2018-01-14 MED ORDER — AMOXICILLIN-POT CLAVULANATE 875-125 MG PO TABS: 1.0000 | ORAL_TABLET | Freq: Two times a day (BID) | ORAL | 0 refills | Status: AC

## 2018-01-14 NOTE — ED Triage Notes (Signed)
Sore throat x 3 days. Has taken naproxen and ibuprofen.

## 2018-01-14 NOTE — ED Notes (Signed)
See triage note  Presents with a 3 day hx of sore throat    No fever

## 2018-01-14 NOTE — ED Provider Notes (Signed)
[facility] Emergency Department Provider Note   ____________________________________________   None    (approximate)  I have reviewed the triage vital signs and the nursing notes.   HISTORY  Chief Complaint Sore Throat    HPI Desiree Stone is a 42 y.o. female patient complain of 3 days of sore throat.  Patient also complained of nasal congestion and postnasal drainage.  Patient denies fever/chills associated this complaint.  Patient denies nausea, vomiting or diarrhea.  No relief with over-the-counter anti-inflammatory medications.  Patient had decreased appetite secondary to dysphagia.  Past Medical History:  Diagnosis Date  . Asthma   . Diabetes mellitus without complication ([place])   . Hypertension     Patient Active Problem List   Diagnosis Date Noted  . Obstructive sleep apnea 06/14/2016  . Smoking 06/14/2016  . Class 3 obesity in adult 04/12/2016  . Insomnia disorder 04/12/2016  . Family history of polyps in the colon 11/12/2014    Past Surgical History:  Procedure Laterality Date  . carpel tunnel    . COLONOSCOPY WITH PROPOFOL N/A 12/08/2014   Procedure: COLONOSCOPY WITH PROPOFOL;  Surgeon: Lollie Sails, MD;  Location: [facility] ENDOSCOPY;  Service: Endoscopy;  Laterality: N/A;  . ESOPHAGOGASTRODUODENOSCOPY      Prior to Admission medications   Medication Sig Start Date End Date Taking? Authorizing Provider  albuterol (PROVENTIL HFA;VENTOLIN HFA) 108 (90 BASE) MCG/ACT inhaler Inhale into the lungs every 6 (six) hours as needed for wheezing or shortness of breath.    [provider]  amoxicillin (AMOXIL) 875 MG tablet Take 1 tablet (875 mg total) by mouth 2 (two) times daily. 09/10/17   Cuthriell, Charline Bills, PA-C  amoxicillin-clavulanate (AUGMENTIN) 875-125 MG tablet Take 1 tablet by mouth 2 (two) times daily for 10 days. 01/14/18 01/24/18  Sable Feil, PA-C  brompheniramine-pseudoephedrine-DM 30-2-10 MG/5ML syrup Take 5  mLs by mouth 4 (four) times daily as needed. 01/14/18   Sable Feil, PA-C  doxycycline (VIBRAMYCIN) 100 MG capsule Take 1 capsule (100 mg total) by mouth 2 (two) times daily. 05/29/17   Malachy Mood, MD  enalapril (VASOTEC) 2.5 MG tablet Take 2.5 mg by mouth daily.    [provider]  fluconazole (DIFLUCAN) 150 MG tablet Take one now and one in a week Patient not taking: Reported on 05/23/2017 04/24/17   Versie Starks, PA-C  fluticasone (FLOVENT DISKUS) 50 MCG/BLIST diskus inhaler Inhale 1 puff into the lungs 2 (two) times daily.    [provider]  lidocaine (XYLOCAINE) 2 % solution Use as directed 5 mLs in the mouth or throat every 6 (six) hours as needed for mouth pain. 01/14/18   Sable Feil, PA-C  magic mouthwash w/lidocaine SOLN Take 5 mLs by mouth 4 (four) times daily. 09/10/17   Cuthriell, Charline Bills, PA-C  medroxyPROGESTERone (PROVERA) 10 MG tablet Take 1 tablet (10 mg total) by mouth daily. 07/04/17   Malachy Mood, MD  meloxicam (MOBIC) 7.5 MG tablet Take 7.5 mg by mouth daily.    [provider]  naproxen (NAPROSYN) 500 MG tablet Take 1 tablet (500 mg total) by mouth 2 (two) times daily with a meal. Patient not taking: Reported on 05/23/2017 05/22/17   Sable Feil, PA-C  traMADol (ULTRAM) 50 MG tablet Take 1 tablet (50 mg total) by mouth every 6 (six) hours as needed. 05/22/17 05/22/18  Sable Feil, PA-C  varenicline (CHANTIX) 1 MG tablet Take 1 mg by mouth 2 (two) times  daily.    [provider]    Allergies Abilify [aripiprazole]; Effexor [venlafaxine]; and Wellbutrin [bupropion]  Family History  Problem Relation Age of Onset  . Breast cancer Paternal Aunt 20  . Cancer Paternal Grandmother        Colon    Social History Social History   Tobacco Use  . Smoking status: Former Smoker    Packs/day: 0.00    Types: Cigarettes    Last attempt to quit: 04/04/2017    Years since quitting: 0.7  . Smokeless tobacco: Never Used    Substance Use Topics  . Alcohol use: No  . Drug use: No    Review of Systems Constitutional: No fever/chills Eyes: No visual changes. ENT: Sore throat and nasal congestion. Cardiovascular: Denies chest pain. Respiratory: Denies shortness of breath. Gastrointestinal: No abdominal pain.  No nausea, no vomiting.  No diarrhea.  No constipation. Genitourinary: Negative for dysuria. Musculoskeletal: Negative for back pain. Skin: Negative for rash. Neurological: Negative for headaches, focal weakness or numbness. Endocrine:Diabetes and hypertension. Allergic/Immunilogical: See medication list. ____________________________________________   PHYSICAL EXAM:  VITAL SIGNS: ED Triage Vitals [01/14/18 0846]  Enc Vitals Group     BP 137/84     Pulse Rate (!) 110     Resp 20     Temp 98.3 F (36.8 C)     Temp Source Oral     SpO2 100 %     Weight 288 lb (130.6 kg)     Height 4\' 11"  (1.499 m)     Head Circumference      Peak Flow      Pain Score 7     Pain Loc      Pain Edu?      Excl. in [place]?    Constitutional: Alert and oriented. Well appearing and in no acute distress. Nose: Edematous nasal turbinate thick rhinorrhea. Mouth/Throat: Mucous membranes are moist.  Oropharynx erythematous with exudative tonsils. Neck: No stridor. Hematological/Lymphatic/Immunilogical: Bilateral cervical lymphadenopathy. Cardiovascular: Cardiac, regular rhythm. Grossly normal heart sounds.  Good peripheral circulation. Respiratory: Normal respiratory effort.  No retractions. Lungs CTAB. Skin:  Skin is warm, dry and intact. No rash noted.  ____________________________________________   LABS (all labs ordered are listed, but only abnormal results are displayed)  Labs Reviewed - No data to display ____________________________________________  EKG   ____________________________________________  RADIOLOGY  ED MD interpretation:    Official radiology report(s): No results  found.  ____________________________________________   PROCEDURES  Procedure(s) performed:   Procedures  Critical Care performed: No  ____________________________________________   INITIAL IMPRESSION / ASSESSMENT AND PLAN / ED COURSE  As part of my medical decision making, I reviewed the following data within the electronic MEDICAL RECORD NUMBER    Nasal congestion and tonsillitis.  Patient given discharge care instruction advised take medication as directed.  Patient given a work note and advised to follow-up with PCP if no improvement in 3 to 5 days.      ____________________________________________   FINAL CLINICAL IMPRESSION(S) / ED DIAGNOSES  Final diagnoses:  Tonsillitis     ED Discharge Orders         Ordered    amoxicillin-clavulanate (AUGMENTIN) 875-125 MG tablet  2 times daily     01/14/18 0920    brompheniramine-pseudoephedrine-DM 30-2-10 MG/5ML syrup  4 times daily PRN     01/14/18 0920    lidocaine (XYLOCAINE) 2 % solution  Every 6 hours PRN     01/14/18 0920  Note:  This document was prepared using Dragon voice recognition software and may include unintentional dictation errors.    Sable Feil, PA-C 01/14/18 5947    Nance Pear, MD 01/14/18 [phone]

## 2018-01-14 NOTE — Discharge Instructions (Addendum)
Take medication as directed and follow discharge care instructions.

## 2018-07-10 ENCOUNTER — Encounter: Payer: Self-pay | Admitting: Emergency Medicine

## 2018-07-10 ENCOUNTER — Emergency Department
Admission: EM | Admit: 2018-07-10 | Discharge: 2018-07-10 | Disposition: A | Discharge status: Home / Self Care | Payer: Medicaid Other | Attending: Emergency Medicine | Admitting: Emergency Medicine

## 2018-07-10 DIAGNOSIS — Z79899 Other long term (current) drug therapy: Secondary | ICD-10-CM | POA: Diagnosis not present

## 2018-07-10 DIAGNOSIS — J069 Acute upper respiratory infection, unspecified: Secondary | ICD-10-CM

## 2018-07-10 DIAGNOSIS — Z87891 Personal history of nicotine dependence: Secondary | ICD-10-CM | POA: Diagnosis not present

## 2018-07-10 DIAGNOSIS — H6690 Otitis media, unspecified, unspecified ear: Secondary | ICD-10-CM | POA: Diagnosis not present

## 2018-07-10 DIAGNOSIS — I10 Essential (primary) hypertension: Secondary | ICD-10-CM | POA: Insufficient documentation

## 2018-07-10 DIAGNOSIS — E119 Type 2 diabetes mellitus without complications: Secondary | ICD-10-CM | POA: Insufficient documentation

## 2018-07-10 DIAGNOSIS — H9201 Otalgia, right ear: Secondary | ICD-10-CM | POA: Diagnosis present

## 2018-07-10 DIAGNOSIS — J45909 Unspecified asthma, uncomplicated: Secondary | ICD-10-CM | POA: Insufficient documentation

## 2018-07-10 MED ORDER — GUAIFENESIN-CODEINE 100-10 MG/5ML PO SYRP: 5.0000 mL | ORAL_SOLUTION | Freq: Three times a day (TID) | ORAL | 0 refills | Status: DC | PRN

## 2018-07-10 MED ORDER — AMOXICILLIN 875 MG PO TABS: 875.0000 mg | ORAL_TABLET | Freq: Two times a day (BID) | ORAL | 0 refills | Status: DC

## 2018-07-10 MED ORDER — PREDNISONE 10 MG PO TABS: 50.0000 mg | ORAL_TABLET | Freq: Every day | ORAL | 0 refills | Status: DC

## 2018-07-10 NOTE — ED Triage Notes (Signed)
Pt reports non productive cough since last week. Pt also reports congestion in her head and now her right ear hurts bad. Denies fevers.

## 2018-07-10 NOTE — Discharge Instructions (Signed)
Follow-up with your primary care provider for symptoms that are not improving over the next few days. Return to the emergency department for symptoms of change or worsen if you are unable to schedule appointment.

## 2018-07-10 NOTE — ED Provider Notes (Signed)
[facility] Emergency Department Provider Note  ____________________________________________  Time seen: Approximately 5:49 PM  I have reviewed the triage vital signs and the nursing notes.   HISTORY  Chief Complaint Otalgia and Cough   HPI Desiree Stone is a 43 y.o. female who presents to the emergency department for treatment and evaluation of cough, nasal congestion, headache, and right earache.  Patient states that her children were ill with similar symptoms but quickly recovered.  She is diabetic and she smokes cigarettes.  She denies fever.  She has had no relief with her albuterol inhaler, NyQuil and DayQuil.    Past Medical History:  Diagnosis Date  . Asthma   . Diabetes mellitus without complication ([place])   . Hypertension     Patient Active Problem List   Diagnosis Date Noted  . Obstructive sleep apnea 06/14/2016  . Smoking 06/14/2016  . Class 3 obesity in adult 04/12/2016  . Insomnia disorder 04/12/2016  . Family history of polyps in the colon 11/12/2014    Past Surgical History:  Procedure Laterality Date  . carpel tunnel    . COLONOSCOPY WITH PROPOFOL N/A 12/08/2014   Procedure: COLONOSCOPY WITH PROPOFOL;  Surgeon: Lollie Sails, MD;  Location: [facility] ENDOSCOPY;  Service: Endoscopy;  Laterality: N/A;  . ESOPHAGOGASTRODUODENOSCOPY      Prior to Admission medications   Medication Sig Start Date End Date Taking? Authorizing Provider  albuterol (PROVENTIL HFA;VENTOLIN HFA) 108 (90 BASE) MCG/ACT inhaler Inhale into the lungs every 6 (six) hours as needed for wheezing or shortness of breath.    [provider]  amoxicillin (AMOXIL) 875 MG tablet Take 1 tablet (875 mg total) by mouth 2 (two) times daily. 07/10/18   Lorry Anastasi, Johnette Abraham B, FNP  brompheniramine-pseudoephedrine-DM 30-2-10 MG/5ML syrup Take 5 mLs by mouth 4 (four) times daily as needed. 01/14/18   Sable Feil, PA-C  doxycycline (VIBRAMYCIN) 100 MG capsule Take 1 capsule  (100 mg total) by mouth 2 (two) times daily. 05/29/17   Malachy Mood, MD  enalapril (VASOTEC) 2.5 MG tablet Take 2.5 mg by mouth daily.    [provider]  fluconazole (DIFLUCAN) 150 MG tablet Take one now and one in a week Patient not taking: Reported on 05/23/2017 04/24/17   Versie Starks, PA-C  fluticasone (FLOVENT DISKUS) 50 MCG/BLIST diskus inhaler Inhale 1 puff into the lungs 2 (two) times daily.    [provider]  guaiFENesin-codeine (ROBITUSSIN AC) 100-10 MG/5ML syrup Take 5 mLs by mouth 3 (three) times daily as needed for cough. 07/10/18   Pamila Mendibles B, FNP  lidocaine (XYLOCAINE) 2 % solution Use as directed 5 mLs in the mouth or throat every 6 (six) hours as needed for mouth pain. 01/14/18   Sable Feil, PA-C  magic mouthwash w/lidocaine SOLN Take 5 mLs by mouth 4 (four) times daily. 09/10/17   Cuthriell, Charline Bills, PA-C  medroxyPROGESTERone (PROVERA) 10 MG tablet Take 1 tablet (10 mg total) by mouth daily. 07/04/17   Malachy Mood, MD  meloxicam (MOBIC) 7.5 MG tablet Take 7.5 mg by mouth daily.    [provider]  naproxen (NAPROSYN) 500 MG tablet Take 1 tablet (500 mg total) by mouth 2 (two) times daily with a meal. Patient not taking: Reported on 05/23/2017 05/22/17   Sable Feil, PA-C  predniSONE (DELTASONE) 10 MG tablet Take 5 tablets (50 mg total) by mouth daily. 07/10/18   Taim Wurm, Johnette Abraham B, FNP  varenicline (CHANTIX) 1 MG tablet Take 1  mg by mouth 2 (two) times daily.    [provider]    Allergies Abilify [aripiprazole]; Effexor [venlafaxine]; and Wellbutrin [bupropion]  Family History  Problem Relation Age of Onset  . Breast cancer Paternal Aunt 69  . Cancer Paternal Grandmother        Colon    Social History Social History   Tobacco Use  . Smoking status: Former Smoker    Packs/day: 0.00    Types: Cigarettes    Last attempt to quit: 04/04/2017    Years since quitting: 1.2  . Smokeless tobacco: Never Used   Substance Use Topics  . Alcohol use: No  . Drug use: No    Review of Systems Constitutional: Negative for fever/chills.  Normal appetite. ENT: Negative for sore throat. Cardiovascular: Denies chest pain. Respiratory: Negative for shortness of breath.  Positive for cough.  Negative for wheezing.  Gastrointestinal: No nausea, no vomiting.  No diarrhea.  Musculoskeletal: Negative for body aches Skin: Negative for rash. Neurological: Positive for headaches ____________________________________________   PHYSICAL EXAM:  VITAL SIGNS: ED Triage Vitals  Enc Vitals Group     BP 07/10/18 1707 (!) 155/88     Pulse Rate 07/10/18 1707 98     Resp 07/10/18 1707 20     Temp 07/10/18 1707 98.2 F (36.8 C)     Temp Source 07/10/18 1707 Oral     SpO2 07/10/18 1707 99 %     Weight 07/10/18 1707 285 lb (129.3 kg)     Height 07/10/18 1707 4\' 11"  (1.499 m)     Head Circumference --      Peak Flow --      Pain Score 07/10/18 1706 0     Pain Loc --      Pain Edu? --      Excl. in [place]? --     Constitutional: Alert and oriented.  Acutely ill appearing and in no acute distress. Eyes: Conjunctivae are normal. Ears: Bilateral tympanic membranes are red, dull, bulging Nose: Frontal and maxillary sinus congestion noted; no rhinnorhea. Mouth/Throat: Mucous membranes are moist.  Oropharynx normal. Tonsils flat. Uvula midline. Neck: No stridor.  Lymphatic: No cervical lymphadenopathy. Cardiovascular: Normal rate, regular rhythm. Good peripheral circulation. Respiratory: Respirations are even and unlabored.  No retractions.  Breath sounds clear to auscultation. Gastrointestinal: Soft and nontender.  Musculoskeletal: FROM x 4 extremities.  Neurologic:  Normal speech and language. Skin:  Skin is warm, dry and intact. No rash noted. Psychiatric: Mood and affect are normal. Speech and behavior are normal.  ____________________________________________   LABS (all labs ordered are listed, but only  abnormal results are displayed)  Labs Reviewed - No data to display ____________________________________________  EKG  Not indicated ____________________________________________  RADIOLOGY  Not indicated ____________________________________________   PROCEDURES  Procedure(s) performed: None  Critical Care performed: No ____________________________________________   INITIAL IMPRESSION / ASSESSMENT AND PLAN / ED COURSE  43 y.o. female who presents to the emergency department for treatment and evaluation of productive cough, headache, nasal congestion, and right earache.  In addition to her albuterol, she will be given a prescription for prednisone, amoxicillin, and Robitussin-AC.  She was encouraged to see her primary care provider for symptoms that are not improving over the next few days.  She was encouraged to return to the emergency department for symptoms of change or worsen if she is unable to schedule an appointment.   Medications - No data to display  ED Discharge Orders  Ordered    amoxicillin (AMOXIL) 875 MG tablet  2 times daily     07/10/18 1802    guaiFENesin-codeine (ROBITUSSIN AC) 100-10 MG/5ML syrup  3 times daily PRN     07/10/18 1802    predniSONE (DELTASONE) 10 MG tablet  Daily     07/10/18 1802           Pertinent labs & imaging results that were available during my care of the patient were reviewed by me and considered in my medical decision making (see chart for details).    If controlled substance prescribed during this visit, 12 month history viewed on the [place] prior to issuing an initial prescription for Schedule II or III opiod. ____________________________________________   FINAL CLINICAL IMPRESSION(S) / ED DIAGNOSES  Final diagnoses:  Acute otitis media, unspecified otitis media type  URI with cough and congestion    Note:  This document was prepared using Dragon voice recognition software and may include unintentional  dictation errors.     Victorino Dike, FNP 07/10/18 Mirian Capuchin    Arta Silence, MD 07/10/18 2019

## 2019-08-05 IMAGING — US US PELVIS COMPLETE
1 series · 13 of 25 positions shown · non-contrast
Comparison: 01/30/2010 CT

CLINICAL DATA: Acute onset of right pelvic pain starting this
morning

EXAM:
TRANSABDOMINAL AND TRANSVAGINAL ULTRASOUND OF PELVIS
TECHNIQUE: Both transabdominal and transvaginal ultrasound examinations of the
pelvis were performed. Transabdominal technique was performed for
global imaging of the pelvis including uterus, ovaries, adnexal
regions, and pelvic cul-de-sac. It was necessary to proceed with
endovaginal exam following the transabdominal exam to visualize the
uterus, endometrium and ovaries.

[Series 1: us pelvis complete · 0.26mm/px · 13 of 82 slices shown]
[im 1/82]
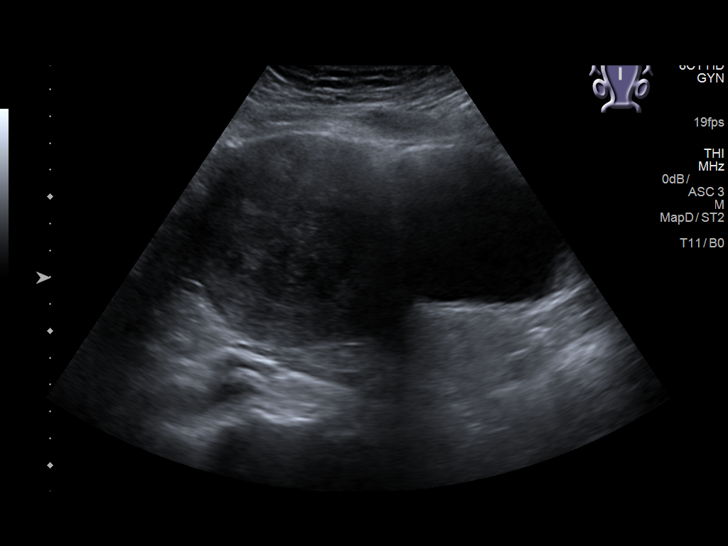
[im 7/82]
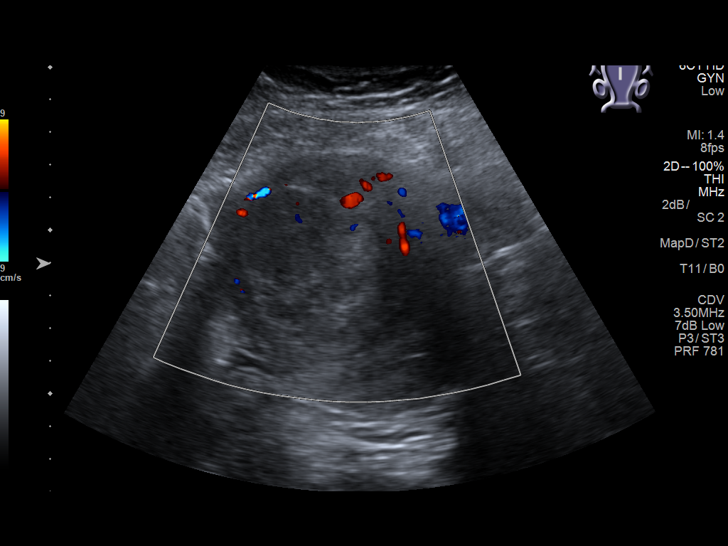
[im 14/82]
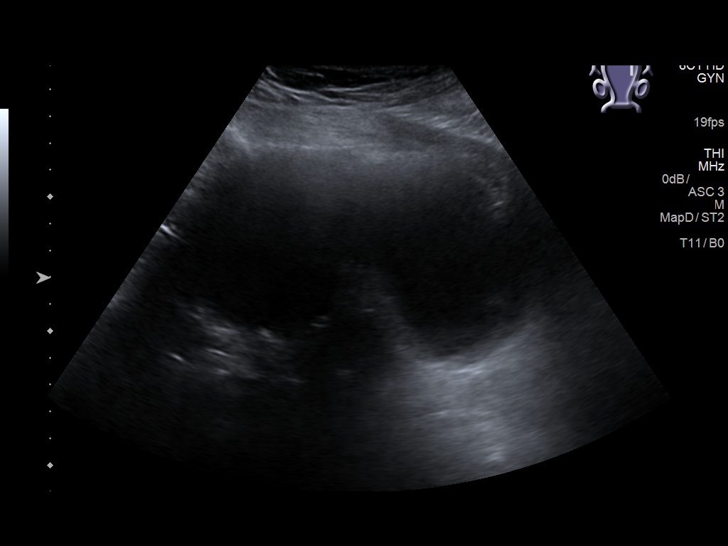
[im 21/82]
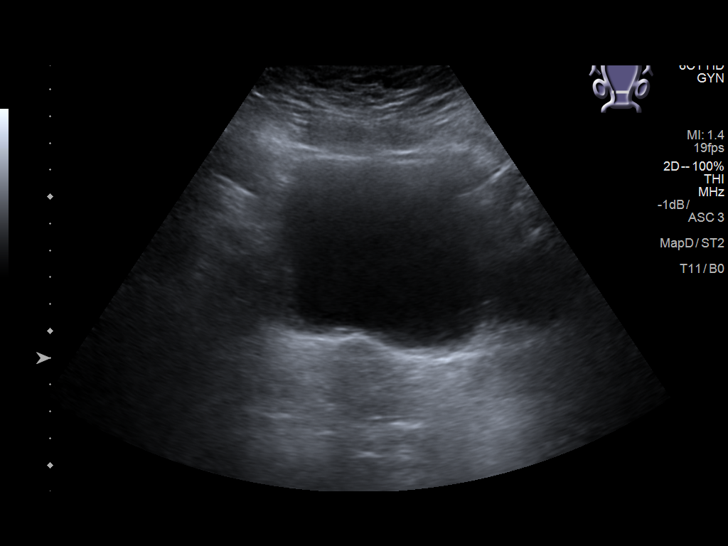
[im 28/82]
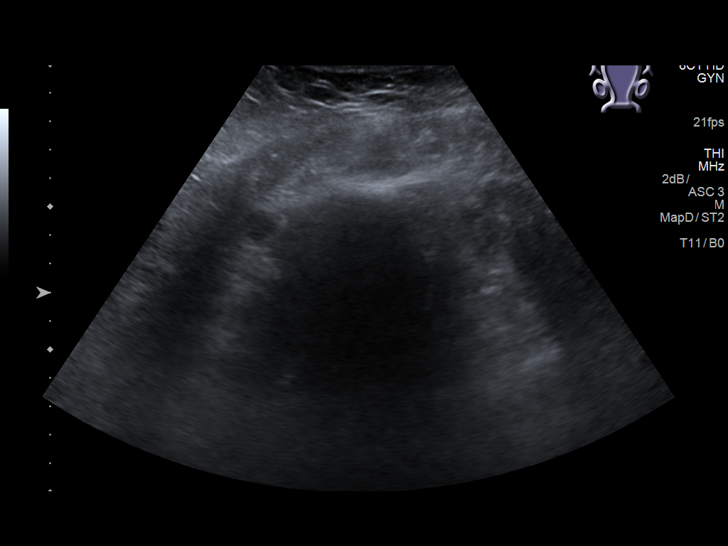
[im 34/82]
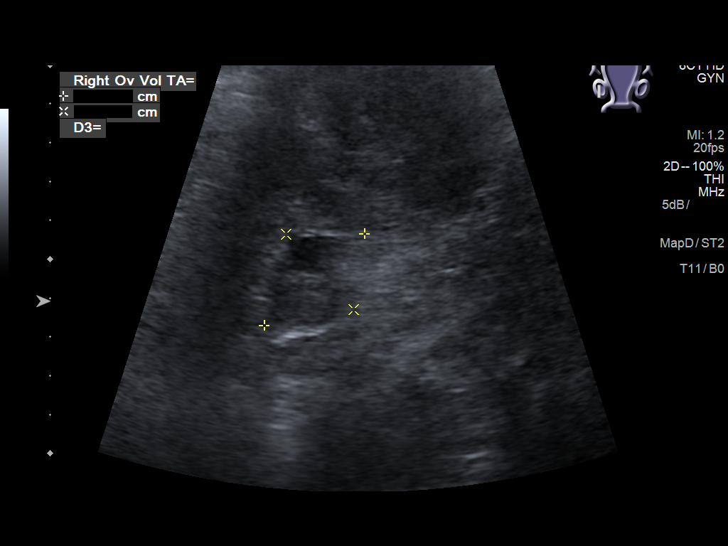
[im 41/82]
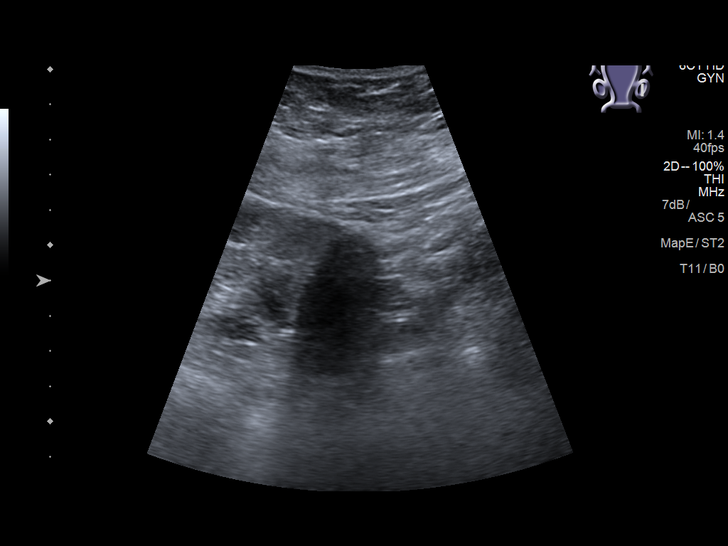
[im 48/82]
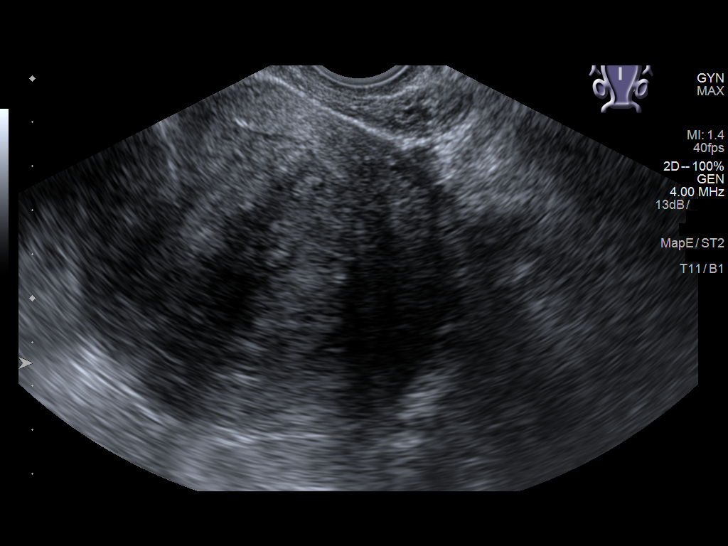
[im 55/82]
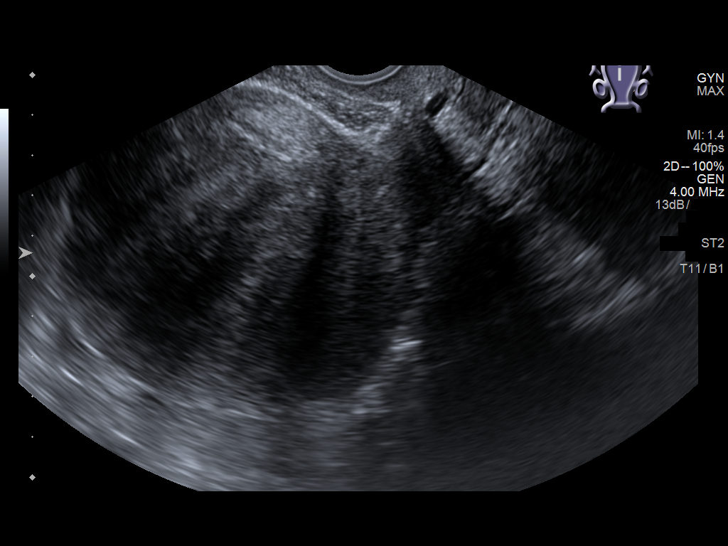
[im 61/82]
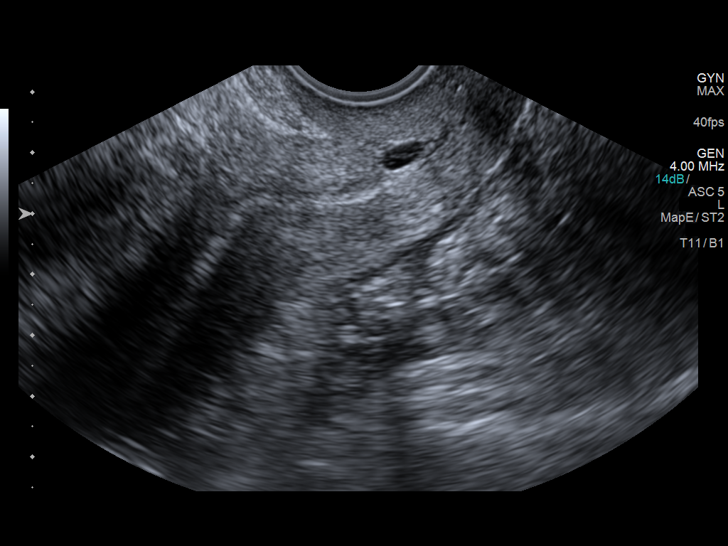
[im 68/82]
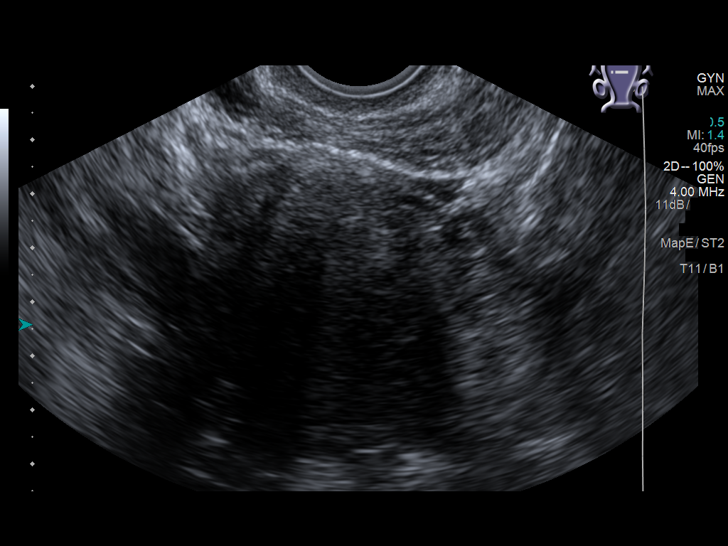
[im 75/82]
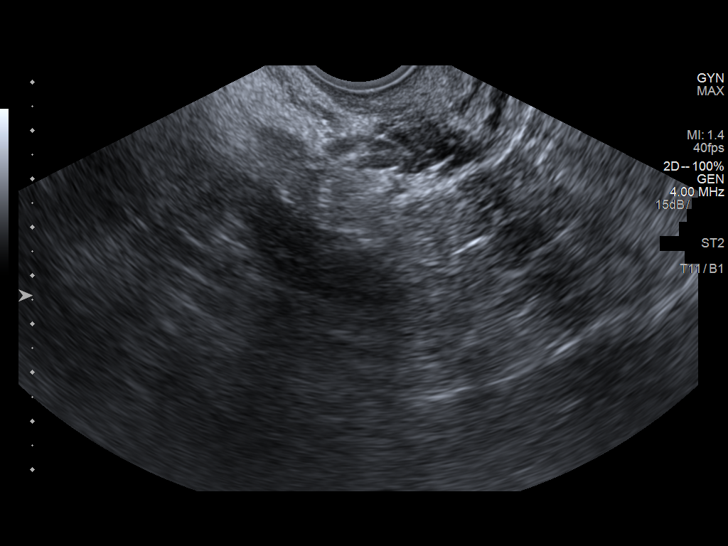
[im 82/82]
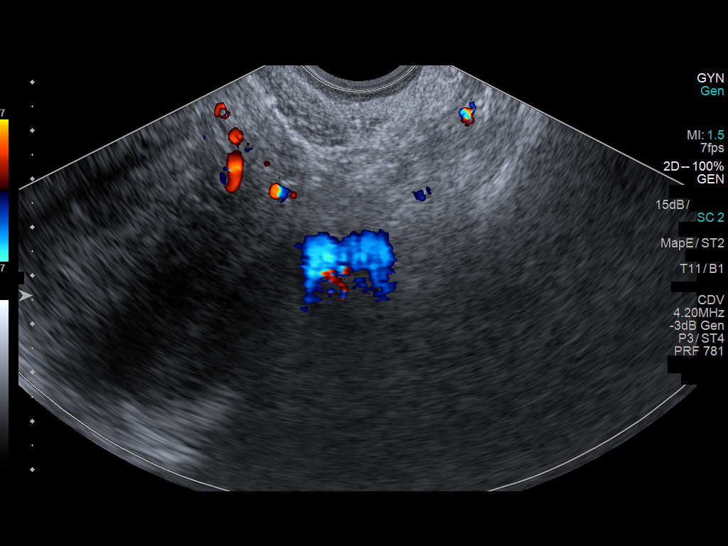

[13 of 25 positions shown; findings below may reference images not displayed]

FINDINGS: Uterus

Measurements: 13.8 x 8.3 x 8.4 cm. The uterus is slightly anteverted
in appearance. There is a hypoechoic solid 6.7 x 5.2 x 5.4 cm
leiomyoma associated with the fundus of the uterus, predominantly
intramural in location with subserosal as well submucosal components
also noted. No additional uterine mass is seen.

Endometrium

Thickness: 4 mm.  No focal abnormality visualized.

Right ovary

Measurements: 3.5 x 2.6 x 2.5 cm. Normal appearance/no adnexal mass.

Left ovary

Measurements: 4.2 x 2.4 x 2 cm. Normal appearance/no adnexal mass.

Other findings

No abnormal free fluid.
IMPRESSION: 1. A 6.7 x 5.2 x 5.4 cm fundal leiomyoma is identified predominantly
intramural in location with submucosal and subserosal components.
2. Unremarkable adnexa.

## 2019-10-02 ENCOUNTER — Other Ambulatory Visit: Payer: Self-pay | Admitting: Student

## 2019-10-02 DIAGNOSIS — Z1231 Encounter for screening mammogram for malignant neoplasm of breast: Secondary | ICD-10-CM

## 2019-10-20 ENCOUNTER — Emergency Department
Admission: EM | Admit: 2019-10-20 | Discharge: 2019-10-20 | Disposition: A | Discharge status: Home / Self Care | Payer: Medicaid Other | Attending: Emergency Medicine | Admitting: Emergency Medicine

## 2019-10-20 ENCOUNTER — Other Ambulatory Visit: Payer: Self-pay

## 2019-10-20 ENCOUNTER — Encounter: Payer: Self-pay | Admitting: Emergency Medicine

## 2019-10-20 DIAGNOSIS — I10 Essential (primary) hypertension: Secondary | ICD-10-CM | POA: Insufficient documentation

## 2019-10-20 DIAGNOSIS — L509 Urticaria, unspecified: Secondary | ICD-10-CM | POA: Insufficient documentation

## 2019-10-20 DIAGNOSIS — Z79899 Other long term (current) drug therapy: Secondary | ICD-10-CM | POA: Insufficient documentation

## 2019-10-20 DIAGNOSIS — J45909 Unspecified asthma, uncomplicated: Secondary | ICD-10-CM | POA: Insufficient documentation

## 2019-10-20 DIAGNOSIS — Z87891 Personal history of nicotine dependence: Secondary | ICD-10-CM | POA: Insufficient documentation

## 2019-10-20 DIAGNOSIS — E1165 Type 2 diabetes mellitus with hyperglycemia: Secondary | ICD-10-CM | POA: Insufficient documentation

## 2019-10-20 LAB — GLUCOSE, CAPILLARY: Glucose-Capillary: 132 mg/dL — ABNORMAL HIGH (ref 70–99)

## 2019-10-20 MED ORDER — DIPHENHYDRAMINE HCL 25 MG PO CAPS
25.0000 mg | ORAL_CAPSULE | ORAL | 0 refills | Status: DC | PRN
Start: 2019-10-20 — End: 2019-10-20

## 2019-10-20 MED ORDER — DIPHENHYDRAMINE HCL 25 MG PO CAPS
25.0000 mg | ORAL_CAPSULE | ORAL | 0 refills | Status: DC | PRN
Start: 2019-10-20 — End: 2022-06-07

## 2019-10-20 MED ORDER — PREDNISONE 10 MG PO TABS
ORAL_TABLET | ORAL | 0 refills | Status: DC
Start: 2019-10-20 — End: 2022-06-07

## 2019-10-20 MED ORDER — PREDNISONE 10 MG PO TABS
ORAL_TABLET | ORAL | 0 refills | Status: DC
Start: 2019-10-20 — End: 2019-10-20

## 2019-10-20 NOTE — ED Provider Notes (Signed)
[facility] Emergency Department Provider Note  ____________________________________________  Time seen: Approximately 3:33 PM  I have reviewed the triage vital signs and the nursing notes.   HISTORY  Chief Complaint Urticaria    HPI Desiree Stone is a 44 y.o. female that presents to the emergency department for evaluation of intermittent hives for the last week.  Patient states that hives will randomly pop up throughout the day.  They itch.  She took children's allergy medication last night for symptoms.  She cannot think of anything that may be causing the hives.  No new soaps, detergents, lotions, body washes.  No new medications.  No new foods.  No new pets.  She thought that maybe it was her deodorant and quit using the deodorant on Saturday, which seemed to improve her symptoms.  However hives returned on Sunday.  Patient denies any hives currently.  She has woke up the last couple of mornings with a dry mouth/throat.  No fever, shortness of breath.  She has an appointment with primary care on June 16.   Past Medical History:  Diagnosis Date  . Asthma   . Diabetes mellitus without complication ([place])   . Hypertension     Patient Active Problem List   Diagnosis Date Noted  . Obstructive sleep apnea 06/14/2016  . Smoking 06/14/2016  . Class 3 obesity in adult 04/12/2016  . Insomnia disorder 04/12/2016  . Family history of polyps in the colon 11/12/2014    Past Surgical History:  Procedure Laterality Date  . carpel tunnel    . COLONOSCOPY WITH PROPOFOL N/A 12/08/2014   Procedure: COLONOSCOPY WITH PROPOFOL;  Surgeon: Lollie Sails, MD;  Location: [facility] ENDOSCOPY;  Service: Endoscopy;  Laterality: N/A;  . ESOPHAGOGASTRODUODENOSCOPY      Prior to Admission medications   Medication Sig Start Date End Date Taking? Authorizing Provider  albuterol (PROVENTIL HFA;VENTOLIN HFA) 108 (90 BASE) MCG/ACT inhaler Inhale into the lungs every 6 (six) hours  as needed for wheezing or shortness of breath.    [provider]  amoxicillin (AMOXIL) 875 MG tablet Take 1 tablet (875 mg total) by mouth 2 (two) times daily. 07/10/18   Triplett, Johnette Abraham B, FNP  brompheniramine-pseudoephedrine-DM 30-2-10 MG/5ML syrup Take 5 mLs by mouth 4 (four) times daily as needed. 01/14/18   Sable Feil, PA-C  diphenhydrAMINE (BENADRYL) 25 mg capsule Take 1 capsule (25 mg total) by mouth every 4 (four) hours as needed. 10/20/19 10/19/20  Laban Emperor, PA-C  enalapril (VASOTEC) 2.5 MG tablet Take 2.5 mg by mouth daily.    [provider]  fluticasone (FLOVENT DISKUS) 50 MCG/BLIST diskus inhaler Inhale 1 puff into the lungs 2 (two) times daily.    [provider]  guaiFENesin-codeine (ROBITUSSIN AC) 100-10 MG/5ML syrup Take 5 mLs by mouth 3 (three) times daily as needed for cough. 07/10/18   Triplett, Cari B, FNP  lidocaine (XYLOCAINE) 2 % solution Use as directed 5 mLs in the mouth or throat every 6 (six) hours as needed for mouth pain. 01/14/18   Sable Feil, PA-C  magic mouthwash w/lidocaine SOLN Take 5 mLs by mouth 4 (four) times daily. 09/10/17   Cuthriell, Charline Bills, PA-C  medroxyPROGESTERone (PROVERA) 10 MG tablet Take 1 tablet (10 mg total) by mouth daily. 07/04/17   Malachy Mood, MD  meloxicam (MOBIC) 7.5 MG tablet Take 7.5 mg by mouth daily.    [provider]  predniSONE (DELTASONE) 10 MG tablet Take 6 tablets on day  1, take 5 tablets on day 2, take 4 tablets on day 3, take 3 tablets on day 4, take 2 tablets on day 5, take 1 tablet on day 6 10/20/19   Laban Emperor, PA-C  varenicline (CHANTIX) 1 MG tablet Take 1 mg by mouth 2 (two) times daily.    [provider]    Allergies Abilify [aripiprazole], Effexor [venlafaxine], and Wellbutrin [bupropion]  Family History  Problem Relation Age of Onset  . Breast cancer Paternal Aunt 37  . Cancer Paternal Grandmother        Colon    Social History Social History    Tobacco Use  . Smoking status: Former Smoker    Packs/day: 0.00    Types: Cigarettes    Quit date: 04/04/2017    Years since quitting: 2.5  . Smokeless tobacco: Never Used  Substance Use Topics  . Alcohol use: No  . Drug use: No     Review of Systems  Cardiovascular: No chest pain. Respiratory: No SOB. Gastrointestinal: No abdominal pain.  No nausea, no vomiting.  Musculoskeletal: Negative for musculoskeletal pain. Skin: Negative for abrasions, lacerations, ecchymosis. Positive for rash. Neurological: Negative for headaches   ____________________________________________   PHYSICAL EXAM:  VITAL SIGNS: ED Triage Vitals [10/20/19 1502]  Enc Vitals Group     BP (!) 154/82     Pulse Rate 100     Resp 18     Temp 98.5 F (36.9 C)     Temp Source Oral     SpO2 99 %     Weight (!) 325 lb (147.4 kg)     Height 4\' 11"  (1.499 m)     Head Circumference      Peak Flow      Pain Score 0     Pain Loc      Pain Edu?      Excl. in [place]?      Constitutional: Alert and oriented. Well appearing and in no acute distress. Eyes: Conjunctivae are normal. PERRL. EOMI. Head: Atraumatic. ENT:      Ears:      Nose: No congestion/rhinnorhea.      Mouth/Throat: Mucous membranes are moist.  Neck: No stridor.   Cardiovascular: Normal rate, regular rhythm.  Good peripheral circulation. Respiratory: Normal respiratory effort without tachypnea or retractions. Lungs CTAB. Good air entry to the bases with no decreased or absent breath sounds. Gastrointestinal: Bowel sounds 4 quadrants. Soft and nontender to palpation. No guarding or rigidity. No palpable masses. No distention.  Musculoskeletal: Full range of motion to all extremities. No gross deformities appreciated. Neurologic:  Normal speech and language. No gross focal neurologic deficits are appreciated.  Skin:  Skin is warm, dry and intact. No rash noted. Psychiatric: Mood and affect are normal. Speech and behavior are normal.  Patient exhibits appropriate insight and judgement.   ____________________________________________   LABS (all labs ordered are listed, but only abnormal results are displayed)  Labs Reviewed  GLUCOSE, CAPILLARY - Abnormal; Notable for the following components:      Result Value   Glucose-Capillary 132 (*)    All other components within normal limits  CBG MONITORING, ED   ____________________________________________  EKG   ____________________________________________  RADIOLOGY   No results found.  ____________________________________________    PROCEDURES  Procedure(s) performed:    Procedures    Medications - No data to display   ____________________________________________   INITIAL IMPRESSION / ASSESSMENT AND PLAN / ED COURSE  Pertinent labs & imaging results that  were available during my care of the patient were reviewed by me and considered in my medical decision making (see chart for details).  Review of the [place] CSRS was performed in accordance of the [place] prior to dispensing any controlled drugs.   Patient presents to emergency department for evaluation of intermittent hives.  Patient is asymptomatic currently.  Patient will be discharged home with prescriptions for prednisone and Benadryl. Patient is to follow up with primary care as directed. Patient is given ED precautions to return to the ED for any worsening or new symptoms.  Desiree Stone was evaluated in Emergency Department on 10/20/2019 for the symptoms described in the history of present illness. She was evaluated in the context of the global COVID-19 pandemic, which necessitated consideration that the patient might be at risk for infection with the SARS-CoV-2 virus that causes COVID-19. Institutional protocols and algorithms that pertain to the evaluation of patients at risk for COVID-19 are in a state of rapid change based on information released by regulatory bodies including the CDC and  federal and state organizations. These policies and algorithms were followed during the patient's care in the ED.   ____________________________________________  FINAL CLINICAL IMPRESSION(S) / ED DIAGNOSES  Final diagnoses:  Hives      NEW MEDICATIONS STARTED DURING THIS VISIT:  ED Discharge Orders         Ordered    predniSONE (DELTASONE) 10 MG tablet     10/20/19 1625    diphenhydrAMINE (BENADRYL) 25 mg capsule  Every 4 hours PRN     10/20/19 1625              This chart was dictated using voice recognition software/Dragon. Despite best efforts to proofread, errors can occur which can change the meaning. Any change was purely unintentional.    Laban Emperor, PA-C 10/20/19 2236    Earleen Newport, MD 10/20/19 [phone]

## 2019-10-20 NOTE — ED Notes (Signed)
See triage note Presents with rash to legs about 1 week ago  Now states she noticed rash to her arms and trunk  Positive itching   No resp diff

## 2019-10-20 NOTE — ED Triage Notes (Signed)
Pt here for intermittent hives to arms and legs for last week.  Has tried benadryl.  When hives appear, itches in nature.  No fever.  Also had a sore throat yesterday AM per pt.  No new products.

## 2020-03-30 DIAGNOSIS — I10 Essential (primary) hypertension: Secondary | ICD-10-CM | POA: Diagnosis present

## 2020-04-08 ENCOUNTER — Other Ambulatory Visit: Payer: Self-pay | Admitting: Obstetrics and Gynecology

## 2020-04-08 ENCOUNTER — Ambulatory Visit
Admission: RE | Admit: 2020-04-08 | Discharge: 2020-04-08 | Disposition: A | Discharge status: Home / Self Care | Payer: Medicaid Other | Source: Ambulatory Visit | Attending: Obstetrics and Gynecology | Admitting: Obstetrics and Gynecology

## 2020-04-08 DIAGNOSIS — J069 Acute upper respiratory infection, unspecified: Secondary | ICD-10-CM | POA: Diagnosis not present

## 2020-05-13 ENCOUNTER — Encounter: Payer: Self-pay | Admitting: Gastroenterology

## 2020-05-13 ENCOUNTER — Ambulatory Visit (INDEPENDENT_AMBULATORY_CARE_PROVIDER_SITE_OTHER): Payer: Medicaid Other | Admitting: Gastroenterology

## 2020-05-13 VITALS — BP 132/72 | HR 105 | Ht 59.0 in | Wt 300.0 lb

## 2020-05-13 DIAGNOSIS — Z8371 Family history of colonic polyps: Secondary | ICD-10-CM | POA: Diagnosis not present

## 2020-05-13 DIAGNOSIS — Z862 Personal history of diseases of the blood and blood-forming organs and certain disorders involving the immune mechanism: Secondary | ICD-10-CM

## 2020-05-13 NOTE — Progress Notes (Signed)
Jonathon Bellows MD, MRCP(U.K) [address]  [place], [place] [address]  Main: [phone]  Fax: [phone]   Gastroenterology Consultation  Referring Provider:     Verlin Dike, MD Primary Care Physician:  [facility] Primary Gastroenterologist:  Dr. Jonathon Bellows  Reason for Consultation:     Iron deficiency anemia        HPI:   Desiree Stone is a 44 y.o. y/o female referred for iron deficiency anemia.  12/08/2014: Colonoscopy: Dr. Gustavo Lah: Family history of colon cancer in multiple second-degree relatives and polyps in the first-degree relatives.  Normal colon.  Review of epic I cannot find any labs to suggest iron deficiency anemia.  Last set of labs in April 2019 demonstrate a hemoglobin of 12.5 g with an MCV of 74.  Recently had a pneumonia in November 2021.  She denies any GI symptoms such.  She says she is recovering from her pneumonia since October 2021.  Still has some residual cough.  She has a CPAP machine and she uses it at night and sleeps very well.  Past Medical History:  Diagnosis Date  . Asthma   . Diabetes mellitus without complication ([place])   . Hypertension     Past Surgical History:  Procedure Laterality Date  . carpel tunnel    . COLONOSCOPY WITH PROPOFOL N/A 12/08/2014   Procedure: COLONOSCOPY WITH PROPOFOL;  Surgeon: Lollie Sails, MD;  Location: [facility] ENDOSCOPY;  Service: Endoscopy;  Laterality: N/A;  . ESOPHAGOGASTRODUODENOSCOPY      Prior to Admission medications   Medication Sig Start Date End Date Taking? Authorizing Provider  albuterol (PROVENTIL HFA;VENTOLIN HFA) 108 (90 BASE) MCG/ACT inhaler Inhale into the lungs every 6 (six) hours as needed for wheezing or shortness of breath.    [provider]  amoxicillin (AMOXIL) 875 MG tablet Take 1 tablet (875 mg total) by mouth 2 (two) times daily. 07/10/18   Triplett, Johnette Abraham B, FNP  brompheniramine-pseudoephedrine-DM 30-2-10 MG/5ML syrup Take 5 mLs by  mouth 4 (four) times daily as needed. 01/14/18   Sable Feil, PA-C  diphenhydrAMINE (BENADRYL) 25 mg capsule Take 1 capsule (25 mg total) by mouth every 4 (four) hours as needed. 10/20/19 10/19/20  Laban Emperor, PA-C  enalapril (VASOTEC) 2.5 MG tablet Take 2.5 mg by mouth daily.    [provider]  fluticasone (FLOVENT DISKUS) 50 MCG/BLIST diskus inhaler Inhale 1 puff into the lungs 2 (two) times daily.    [provider]  guaiFENesin-codeine (ROBITUSSIN AC) 100-10 MG/5ML syrup Take 5 mLs by mouth 3 (three) times daily as needed for cough. 07/10/18   Triplett, Cari B, FNP  lidocaine (XYLOCAINE) 2 % solution Use as directed 5 mLs in the mouth or throat every 6 (six) hours as needed for mouth pain. 01/14/18   Sable Feil, PA-C  magic mouthwash w/lidocaine SOLN Take 5 mLs by mouth 4 (four) times daily. 09/10/17   Cuthriell, Charline Bills, PA-C  medroxyPROGESTERone (PROVERA) 10 MG tablet Take 1 tablet (10 mg total) by mouth daily. 07/04/17   Malachy Mood, MD  meloxicam (MOBIC) 7.5 MG tablet Take 7.5 mg by mouth daily.    [provider]  predniSONE (DELTASONE) 10 MG tablet Take 6 tablets on day 1, take 5 tablets on day 2, take 4 tablets on day 3, take 3 tablets on day 4, take 2 tablets on day 5, take 1 tablet on day 6 10/20/19   Laban Emperor, PA-C  varenicline (  CHANTIX) 1 MG tablet Take 1 mg by mouth 2 (two) times daily.    [provider]    Family History  Problem Relation Age of Onset  . Breast cancer Paternal Aunt 75  . Cancer Paternal Grandmother        Colon     Social History   Tobacco Use  . Smoking status: Former Smoker    Packs/day: 0.00    Types: Cigarettes    Quit date: 04/04/2017    Years since quitting: 3.1  . Smokeless tobacco: Never Used  Vaping Use  . Vaping Use: Never used  Substance Use Topics  . Alcohol use: No  . Drug use: No    Allergies as of 05/13/2020 - Review Complete 07/10/2018  Allergen Reaction Noted  . Abilify  [aripiprazole] Other (See Comments) 12/07/2014  . Effexor [venlafaxine] Other (See Comments) 12/07/2014  . Wellbutrin [bupropion] Other (See Comments) 12/07/2014    Review of Systems:    All systems reviewed and negative except where noted in HPI.   Physical Exam:  There were no vitals taken for this visit. No LMP recorded. Psych:  Alert and cooperative. Normal mood and affect. General:   Alert,  Well-developed, well-nourished, pleasant and cooperative in NAD Head:  Normocephalic and atraumatic. Eyes:  Sclera clear, no icterus.   Conjunctiva pink. Lungs:  Respirations even and unlabored.  Clear throughout to auscultation.   No wheezes, crackles, or rhonchi. No acute distress. Heart:  Regular rate and rhythm; no murmurs, clicks, rubs, or gallops. Abdomen:  Normal bowel sounds.  No bruits.  Soft, non-tender and non-distended without masses, hepatosplenomegaly or hernias noted.  No guarding or rebound tenderness.  . Neurologic:  Alert and oriented x3;  grossly normal neurologically. Psych:  Alert and cooperative. Normal mood and affect.  Imaging Studies: No results found.  Assessment and Plan:   Desiree Stone is a 44 y.o. y/o female has been referred for a colonoscopy and iron deficiency anemia.  I reviewed epic and I cannot find any recent labs suggestive of iron deficiency anemia.  I did note that she had a microcytosis but normal hemoglobin 2 years back.  She is African-American and very likely could have thalassemia trait.  I will recheck her hemoglobin and iron studies.  With a family history of colon polyps I would suggest we perform a colonoscopy.  If she were to have iron deficiency in addition we will add on an upper endoscopy.  In view of residual cough from her pneumonia I have suggested we will wait for another 6 to 8 weeks for her cough to resolve.  I have also suggested her to bring along her CPAP machine during the procedure which can be used   With a BMI over 50 she is   high risk for endoscopy and I have discussed the same with her.  I have discussed alternative options, risks & benefits,  which include, but are not limited to, bleeding, infection, perforation,respiratory complication & drug reaction.  The patient agrees with this plan & written consent will be obtained.     Follow up in as needed  Dr Jonathon Bellows MD,MRCP(U.K)

## 2020-05-14 LAB — CBC WITH DIFFERENTIAL/PLATELET
Basophils Absolute: 0.1 10*3/uL (ref 0.0–0.2)
Basos: 0 %
EOS (ABSOLUTE): 0.1 10*3/uL (ref 0.0–0.4)
Eos: 1 %
Hematocrit: 41.7 % (ref 34.0–46.6)
Hemoglobin: 12.6 g/dL (ref 11.1–15.9)
Immature Grans (Abs): 0 10*3/uL (ref 0.0–0.1)
Immature Granulocytes: 0 %
Lymphocytes Absolute: 3.8 10*3/uL — ABNORMAL HIGH (ref 0.7–3.1)
Lymphs: 33 %
MCH: 21.5 pg — ABNORMAL LOW (ref 26.6–33.0)
MCHC: 30.2 g/dL — ABNORMAL LOW (ref 31.5–35.7)
MCV: 71 fL — ABNORMAL LOW (ref 79–97)
Monocytes Absolute: 1.1 10*3/uL — ABNORMAL HIGH (ref 0.1–0.9)
Monocytes: 9 %
Neutrophils Absolute: 6.5 10*3/uL (ref 1.4–7.0)
Neutrophils: 57 %
Platelets: 297 10*3/uL (ref 150–450)
RBC: 5.85 x10E6/uL — ABNORMAL HIGH (ref 3.77–5.28)
RDW: 18.1 % — ABNORMAL HIGH (ref 11.7–15.4)
WBC: 11.6 10*3/uL — ABNORMAL HIGH (ref 3.4–10.8)

## 2020-05-14 LAB — IRON,TIBC AND FERRITIN PANEL
Ferritin: 20 ng/mL (ref 15–150)
Iron Saturation: 11 % — ABNORMAL LOW (ref 15–55)
Iron: 45 ug/dL (ref 27–159)
Total Iron Binding Capacity: 422 ug/dL (ref 250–450)
UIBC: 377 ug/dL (ref 131–425)

## 2020-06-28 ENCOUNTER — Telehealth: Payer: Self-pay

## 2020-06-28 NOTE — Telephone Encounter (Signed)
Called patient and informed her of Dr. Georgeann Oppenheim comments/recommedations. Informed patient will get her on the nurse schedule to get her scheduled for a colonoscopy. Pt verbalized understanding.

## 2020-06-28 NOTE — Telephone Encounter (Signed)
-----   Message from Jonathon Bellows, MD sent at 06/27/2020  7:42 PM EST ----- Inform no clear evidence of iron deficiency , suggest colonoscopy when recovered from pneumonia

## 2021-07-06 ENCOUNTER — Ambulatory Visit
Admission: RE | Admit: 2021-07-06 | Discharge: 2021-07-06 | Disposition: A | Discharge status: Home / Self Care | Payer: Medicaid Other | Source: Ambulatory Visit | Attending: Obstetrics | Admitting: Obstetrics

## 2021-07-06 ENCOUNTER — Other Ambulatory Visit: Payer: Self-pay | Admitting: Obstetrics

## 2021-07-06 ENCOUNTER — Other Ambulatory Visit
Admission: RE | Admit: 2021-07-06 | Discharge: 2021-07-06 | Disposition: A | Discharge status: Home / Self Care | Payer: Medicaid Other | Source: Ambulatory Visit | Attending: Family Medicine | Admitting: Family Medicine

## 2021-07-06 DIAGNOSIS — J4520 Mild intermittent asthma, uncomplicated: Secondary | ICD-10-CM | POA: Insufficient documentation

## 2021-07-20 ENCOUNTER — Ambulatory Visit
Admission: RE | Admit: 2021-07-20 | Discharge: 2021-07-20 | Disposition: A | Discharge status: Home / Self Care | Payer: Medicaid Other | Source: Ambulatory Visit | Attending: Family Medicine | Admitting: Family Medicine

## 2021-07-20 ENCOUNTER — Other Ambulatory Visit: Payer: Self-pay | Admitting: Family Medicine

## 2021-07-20 DIAGNOSIS — J4521 Mild intermittent asthma with (acute) exacerbation: Secondary | ICD-10-CM

## 2021-07-20 DIAGNOSIS — M5489 Other dorsalgia: Secondary | ICD-10-CM | POA: Diagnosis not present

## 2022-06-06 ENCOUNTER — Inpatient Hospital Stay
Admission: EM | Admit: 2022-06-06 | Discharge: 2022-07-05 | DRG: 004 | Disposition: A | Discharge status: Rehabilitation Facility | Payer: Medicaid Other | Attending: Internal Medicine | Admitting: Internal Medicine

## 2022-06-06 ENCOUNTER — Emergency Department: Payer: Medicaid Other

## 2022-06-06 ENCOUNTER — Other Ambulatory Visit: Payer: Self-pay

## 2022-06-06 ENCOUNTER — Encounter: Payer: Self-pay | Admitting: Emergency Medicine

## 2022-06-06 DIAGNOSIS — E876 Hypokalemia: Secondary | ICD-10-CM | POA: Diagnosis not present

## 2022-06-06 DIAGNOSIS — D649 Anemia, unspecified: Secondary | ICD-10-CM | POA: Diagnosis present

## 2022-06-06 DIAGNOSIS — I10 Essential (primary) hypertension: Secondary | ICD-10-CM | POA: Diagnosis present

## 2022-06-06 DIAGNOSIS — Z6841 Body Mass Index (BMI) 40.0 and over, adult: Secondary | ICD-10-CM

## 2022-06-06 DIAGNOSIS — E119 Type 2 diabetes mellitus without complications: Secondary | ICD-10-CM

## 2022-06-06 DIAGNOSIS — Z7984 Long term (current) use of oral hypoglycemic drugs: Secondary | ICD-10-CM

## 2022-06-06 DIAGNOSIS — E8729 Other acidosis: Secondary | ICD-10-CM | POA: Diagnosis present

## 2022-06-06 DIAGNOSIS — Z888 Allergy status to other drugs, medicaments and biological substances status: Secondary | ICD-10-CM

## 2022-06-06 DIAGNOSIS — L02211 Cutaneous abscess of abdominal wall: Secondary | ICD-10-CM | POA: Diagnosis not present

## 2022-06-06 DIAGNOSIS — Y95 Nosocomial condition: Secondary | ICD-10-CM | POA: Diagnosis not present

## 2022-06-06 DIAGNOSIS — E662 Morbid (severe) obesity with alveolar hypoventilation: Secondary | ICD-10-CM | POA: Diagnosis present

## 2022-06-06 DIAGNOSIS — R0603 Acute respiratory distress: Principal | ICD-10-CM

## 2022-06-06 DIAGNOSIS — I5031 Acute diastolic (congestive) heart failure: Secondary | ICD-10-CM | POA: Diagnosis present

## 2022-06-06 DIAGNOSIS — G9341 Metabolic encephalopathy: Secondary | ICD-10-CM | POA: Diagnosis not present

## 2022-06-06 DIAGNOSIS — J9601 Acute respiratory failure with hypoxia: Principal | ICD-10-CM | POA: Diagnosis present

## 2022-06-06 DIAGNOSIS — G4733 Obstructive sleep apnea (adult) (pediatric): Secondary | ICD-10-CM | POA: Diagnosis present

## 2022-06-06 DIAGNOSIS — J189 Pneumonia, unspecified organism: Secondary | ICD-10-CM | POA: Diagnosis not present

## 2022-06-06 DIAGNOSIS — I2489 Other forms of acute ischemic heart disease: Secondary | ICD-10-CM | POA: Diagnosis present

## 2022-06-06 DIAGNOSIS — F17210 Nicotine dependence, cigarettes, uncomplicated: Secondary | ICD-10-CM | POA: Diagnosis present

## 2022-06-06 DIAGNOSIS — D72829 Elevated white blood cell count, unspecified: Secondary | ICD-10-CM | POA: Diagnosis present

## 2022-06-06 DIAGNOSIS — E278 Other specified disorders of adrenal gland: Secondary | ICD-10-CM | POA: Diagnosis present

## 2022-06-06 DIAGNOSIS — R1310 Dysphagia, unspecified: Secondary | ICD-10-CM

## 2022-06-06 DIAGNOSIS — J4521 Mild intermittent asthma with (acute) exacerbation: Secondary | ICD-10-CM | POA: Diagnosis present

## 2022-06-06 DIAGNOSIS — J45909 Unspecified asthma, uncomplicated: Secondary | ICD-10-CM

## 2022-06-06 DIAGNOSIS — Z1152 Encounter for screening for COVID-19: Secondary | ICD-10-CM

## 2022-06-06 DIAGNOSIS — Z803 Family history of malignant neoplasm of breast: Secondary | ICD-10-CM

## 2022-06-06 DIAGNOSIS — I110 Hypertensive heart disease with heart failure: Secondary | ICD-10-CM | POA: Diagnosis present

## 2022-06-06 DIAGNOSIS — E785 Hyperlipidemia, unspecified: Secondary | ICD-10-CM | POA: Diagnosis present

## 2022-06-06 DIAGNOSIS — J9602 Acute respiratory failure with hypercapnia: Secondary | ICD-10-CM | POA: Diagnosis present

## 2022-06-06 DIAGNOSIS — R197 Diarrhea, unspecified: Secondary | ICD-10-CM | POA: Diagnosis not present

## 2022-06-06 DIAGNOSIS — J9600 Acute respiratory failure, unspecified whether with hypoxia or hypercapnia: Secondary | ICD-10-CM | POA: Diagnosis present

## 2022-06-06 DIAGNOSIS — Z79899 Other long term (current) drug therapy: Secondary | ICD-10-CM

## 2022-06-06 DIAGNOSIS — E1165 Type 2 diabetes mellitus with hyperglycemia: Secondary | ICD-10-CM | POA: Diagnosis present

## 2022-06-06 DIAGNOSIS — J440 Chronic obstructive pulmonary disease with acute lower respiratory infection: Secondary | ICD-10-CM | POA: Diagnosis not present

## 2022-06-06 DIAGNOSIS — J441 Chronic obstructive pulmonary disease with (acute) exacerbation: Secondary | ICD-10-CM | POA: Diagnosis present

## 2022-06-06 DIAGNOSIS — R0902 Hypoxemia: Secondary | ICD-10-CM

## 2022-06-06 DIAGNOSIS — E871 Hypo-osmolality and hyponatremia: Secondary | ICD-10-CM | POA: Diagnosis present

## 2022-06-06 HISTORY — DX: Other chronic pain: G89.29

## 2022-06-06 HISTORY — DX: Iron deficiency anemia, unspecified: D50.9

## 2022-06-06 HISTORY — DX: Nicotine dependence, unspecified, uncomplicated: F17.200

## 2022-06-06 HISTORY — DX: Benign neoplasm of connective and other soft tissue, unspecified: D21.9

## 2022-06-06 HISTORY — DX: Sleep apnea, unspecified: G47.30

## 2022-06-06 LAB — TROPONIN I (HIGH SENSITIVITY)
Troponin I (High Sensitivity): 22 ng/L — ABNORMAL HIGH (ref <18)
Troponin I (High Sensitivity): 21 ng/L — ABNORMAL HIGH (ref <18)

## 2022-06-06 LAB — COMPREHENSIVE METABOLIC PANEL
ALT: 15 U/L (ref 0–44)
AST: 16 U/L (ref 15–41)
Albumin: 3.4 g/dL — ABNORMAL LOW (ref 3.5–5.0)
Alkaline Phosphatase: 106 U/L (ref 38–126)
Anion gap: 10 (ref 5–15)
BUN: 6 mg/dL (ref 6–20)
CO2: 31 mmol/L (ref 22–32)
Calcium: 8.6 mg/dL — ABNORMAL LOW (ref 8.9–10.3)
Chloride: 93 mmol/L — ABNORMAL LOW (ref 98–111)
Creatinine, Ser: 0.52 mg/dL (ref 0.44–1.00)
GFR, Estimated: >60 mL/min (ref >60)
Glucose, Bld: 189 mg/dL — ABNORMAL HIGH (ref 70–99)
Potassium: 3.9 mmol/L (ref 3.5–5.1)
Sodium: 134 mmol/L — ABNORMAL LOW (ref 135–145)
Total Bilirubin: 0.5 mg/dL (ref 0.3–1.2)
Total Protein: 7.5 g/dL (ref 6.5–8.1)

## 2022-06-06 LAB — CBC
HCT: 46.2 % — ABNORMAL HIGH (ref 36.0–46.0)
Hemoglobin: 12 g/dL (ref 12.0–15.0)
MCH: 18 pg — ABNORMAL LOW (ref 26.0–34.0)
MCHC: 26 g/dL — ABNORMAL LOW (ref 30.0–36.0)
MCV: 69.3 fL — ABNORMAL LOW (ref 80.0–100.0)
Platelets: 312 10*3/uL (ref 150–400)
RBC: 6.67 MIL/uL — ABNORMAL HIGH (ref 3.87–5.11)
RDW: 22 % — ABNORMAL HIGH (ref 11.5–15.5)
WBC: 14.4 10*3/uL — ABNORMAL HIGH (ref 4.0–10.5)
nRBC: 1.2 % — ABNORMAL HIGH (ref 0.0–0.2)

## 2022-06-06 LAB — BLOOD GAS, VENOUS
Acid-Base Excess: 10.2 mmol/L — ABNORMAL HIGH (ref 0.0–2.0)
Bicarbonate: 40.9 mmol/L — ABNORMAL HIGH (ref 20.0–28.0)
O2 Saturation: 89.8 %
Patient temperature: 37
pCO2, Ven: 89 mmHg (ref 44–60)
pH, Ven: 7.27 (ref 7.25–7.43)
pO2, Ven: 63 mmHg — ABNORMAL HIGH (ref 32–45)

## 2022-06-06 LAB — RESP PANEL BY RT-PCR (RSV, FLU A&B, COVID)  RVPGX2
Influenza A by PCR: NEGATIVE
Influenza B by PCR: NEGATIVE
Resp Syncytial Virus by PCR: NEGATIVE
SARS Coronavirus 2 by RT PCR: NEGATIVE

## 2022-06-06 LAB — BRAIN NATRIURETIC PEPTIDE: B Natriuretic Peptide: 103.6 pg/mL — ABNORMAL HIGH (ref 0.0–100.0)

## 2022-06-06 LAB — GROUP A STREP BY PCR: Group A Strep by PCR: NOT DETECTED

## 2022-06-06 LAB — TSH: TSH: 0.772 u[IU]/mL (ref 0.350–4.500)

## 2022-06-06 LAB — CBG MONITORING, ED: Glucose-Capillary: 241 mg/dL — ABNORMAL HIGH (ref 70–99)

## 2022-06-06 MED ORDER — IOHEXOL 350 MG/ML SOLN
100.0000 mL | Freq: Once | INTRAVENOUS | Status: AC | PRN
  Administered 2022-06-06: 100 mL via INTRAVENOUS

## 2022-06-06 MED ORDER — ONDANSETRON HCL 4 MG/2ML IJ SOLN: 4.0000 mg | Freq: Four times a day (QID) | INTRAMUSCULAR | Status: DC | PRN

## 2022-06-06 MED ORDER — SODIUM CHLORIDE 0.9% FLUSH
3.0000 mL | Freq: Two times a day (BID) | INTRAVENOUS | Status: DC
  Administered 2022-06-06 – 2022-06-22 (×25): 3 mL via INTRAVENOUS

## 2022-06-06 MED ORDER — FUROSEMIDE 10 MG/ML IJ SOLN
40.0000 mg | Freq: Once | INTRAMUSCULAR | Status: AC
  Administered 2022-06-06: 40 mg via INTRAVENOUS
  Filled 2022-06-06: qty 4

## 2022-06-06 MED ORDER — IPRATROPIUM-ALBUTEROL 0.5-2.5 (3) MG/3ML IN SOLN
3.0000 mL | Freq: Once | RESPIRATORY_TRACT | Status: AC
  Administered 2022-06-06: 3 mL via RESPIRATORY_TRACT
  Filled 2022-06-06: qty 3

## 2022-06-06 MED ORDER — INSULIN ASPART 100 UNIT/ML IJ SOLN: 0.0000 [IU] | Freq: Three times a day (TID) | INTRAMUSCULAR | Status: DC

## 2022-06-06 MED ORDER — ACETAMINOPHEN 325 MG RE SUPP: 650.0000 mg | Freq: Four times a day (QID) | RECTAL | Status: DC | PRN

## 2022-06-06 MED ORDER — ACETAMINOPHEN 325 MG PO TABS: 650.0000 mg | ORAL_TABLET | Freq: Four times a day (QID) | ORAL | Status: DC | PRN

## 2022-06-06 MED ORDER — ENALAPRIL MALEATE 2.5 MG PO TABS: 2.5000 mg | ORAL_TABLET | Freq: Every day | ORAL | Status: DC

## 2022-06-06 MED ORDER — POLYETHYLENE GLYCOL 3350 17 G PO PACK: 17.0000 g | PACK | Freq: Every day | ORAL | Status: DC | PRN

## 2022-06-06 MED ORDER — METHYLPREDNISOLONE SODIUM SUCC 125 MG IJ SOLR
125.0000 mg | Freq: Every day | INTRAMUSCULAR | Status: DC
  Administered 2022-06-06: 125 mg via INTRAVENOUS
  Filled 2022-06-06: qty 2

## 2022-06-06 MED ORDER — ATORVASTATIN CALCIUM 20 MG PO TABS: 40.0000 mg | ORAL_TABLET | Freq: Every day | ORAL | Status: DC

## 2022-06-06 MED ORDER — ONDANSETRON HCL 4 MG PO TABS: 4.0000 mg | ORAL_TABLET | Freq: Four times a day (QID) | ORAL | Status: DC | PRN

## 2022-06-06 MED ORDER — ALBUTEROL SULFATE (2.5 MG/3ML) 0.083% IN NEBU: 2.5000 mg | INHALATION_SOLUTION | Freq: Four times a day (QID) | RESPIRATORY_TRACT | Status: DC | PRN

## 2022-06-06 MED ORDER — ADULT MULTIVITAMIN W/MINERALS CH: 1.0000 | ORAL_TABLET | Freq: Every day | ORAL | Status: DC

## 2022-06-06 MED ORDER — BUDESONIDE 0.25 MG/2ML IN SUSP
0.2500 mg | Freq: Two times a day (BID) | RESPIRATORY_TRACT | Status: DC
  Administered 2022-06-07 – 2022-06-08 (×3): 0.25 mg via RESPIRATORY_TRACT
  Filled 2022-06-06 (×3): qty 2

## 2022-06-06 MED ORDER — ENOXAPARIN SODIUM 80 MG/0.8ML IJ SOSY
0.5000 mg/kg | PREFILLED_SYRINGE | INTRAMUSCULAR | Status: DC
  Administered 2022-06-07 (×2): 67.5 mg via SUBCUTANEOUS
  Filled 2022-06-06 (×2): qty 0.68
  Filled 2022-06-06: qty 0.8

## 2022-06-06 NOTE — Assessment & Plan Note (Addendum)
Patient presenting with several month history of dyspnea both at rest and with exertion in addition to lower extremity edema and orthopnea.  CTA was obtained that did not show any evidence of PE or primary pulmonary disease, however notable for cardiomegaly with enlarged main pulmonary artery and reflux of contrast into the IVC.  BNP is only minimally elevated, which may be falsely low in the setting of morbid obesity.  Differential at this time predominantly right-sided heart failure +/- pulmonary hypertension.  This may be in the setting of untreated OSA as patient is unable to tolerate her CPAP with likely overlying obesity hypoventilation syndrome.  VBG demonstrated elevated pCO2, that is somewhat compensated with elevated bicarb, however will place BiPAP due to increased work of breathing.  - BiPAP as needed - Continue supplemental oxygen to maintain oxygen saturation above 88% - TSH pending - Echocardiogram ordered - S/p Lasix IV once - Strict in and out - Daily weights

## 2022-06-06 NOTE — H&P (Addendum)
History and Physical    Patient: Desiree Stone EQA:[id number] DOB: 10-12-1975 DOA: 06/06/2022 DOS: the patient was seen and examined on 06/06/2022 PCP: [facility] [employer]  Patient coming from: Home  Chief Complaint:  Chief Complaint  Patient presents with   Shortness of Breath   HPI: Desiree Stone is a 47 y.o. female with medical history significant of mild intermittent asthma, diabetes, hypertension, who presents to the ED with complaints of shortness of breath.  Desiree Stone states that for the last several months, she has been experiencing gradually worsening shortness of breath and dyspnea on exertion in addition to orthopnea.  In addition, she endorses a productive cough with thick sputum and palpitations.  Over the last 3 weeks, she has been coughing so much that she began to lose her voice.  She denies any difficulty with swallowing or sensation of choking.  She endorses lower extremity swelling but denies any increased abdominal distention.  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  She denies any prior history of heart failure.    ED course: On arrival to the ED, patient was hypertensive at 136/99 with heart rate of 98 and respiratory rate of 24.  She was saturating at 90% on 2 L.  Initial workup remarkable for sodium of 134, glucose of 189, bicarb of 31, creatinine of 0.52, WBC of 14.4, hemoglobin of 12.0, BNP 103 and troponin of 22.  COVID-19, influenza and RSV negative.   CTA was obtained that did not show any evidence of acute PE, however evidence of cardiomegaly and enlarged main pulmonary artery suggesting pulmonary hypertension.  CT neck was obtained that did not show any acute abnormalities.  TRH contacted for admission for acute hypoxic respiratory failure.  Review of Systems: As mentioned in the history of present illness. All other systems reviewed and are negative.  Past Medical  History:  Diagnosis Date   Asthma    Diabetes mellitus without complication ([place])    Hypertension    Past Surgical History:  Procedure Laterality Date   carpel tunnel     COLONOSCOPY WITH PROPOFOL N/A 12/08/2014   Procedure: COLONOSCOPY WITH PROPOFOL;  Surgeon: Lollie Sails, MD;  Location: [facility] ENDOSCOPY;  Service: Endoscopy;  Laterality: N/A;   ESOPHAGOGASTRODUODENOSCOPY     Social History:  reports that she quit smoking about 5 years ago. Her smoking use included cigarettes. She has never used smokeless tobacco. She reports that she does not drink alcohol and does not use drugs.  Allergies  Allergen Reactions   Abilify [Aripiprazole] Other (See Comments)    Syncope   Effexor [Venlafaxine] Other (See Comments)    Hair Loss   Wellbutrin [Bupropion] Other (See Comments)    Acne     Family History  Problem Relation Age of Onset   Breast cancer Paternal Aunt 71   Cancer Paternal Grandmother        Colon    Prior to Admission medications   Medication Sig Start Date End Date Taking? Authorizing Provider  albuterol (PROVENTIL HFA;VENTOLIN HFA) 108 (90 BASE) MCG/ACT inhaler Inhale into the lungs every 6 (six) hours as needed for wheezing or shortness of breath.    [provider]  amoxicillin (AMOXIL) 875 MG tablet Take 1 tablet (875 mg total) by mouth 2 (two) times daily. Patient not taking: Reported on 05/13/2020 07/10/18   Sherrie George B, FNP  atorvastatin (LIPITOR) 40 MG tablet Take  40 mg by mouth at bedtime. 05/06/20   [provider]  brompheniramine-pseudoephedrine-DM 30-2-10 MG/5ML syrup Take 5 mLs by mouth 4 (four) times daily as needed. Patient not taking: Reported on 05/13/2020 01/14/18   Sable Feil, PA-C  diphenhydrAMINE (BENADRYL) 25 mg capsule Take 1 capsule (25 mg total) by mouth every 4 (four) hours as needed. Patient not taking: Reported on 05/13/2020 10/20/19 10/19/20  Laban Emperor, PA-C  enalapril (VASOTEC) 2.5 MG tablet Take 2.5 mg by  mouth daily.    [provider]  fluticasone (FLOVENT DISKUS) 50 MCG/BLIST diskus inhaler Inhale 1 puff into the lungs 2 (two) times daily.    [provider]  guaiFENesin-codeine (ROBITUSSIN AC) 100-10 MG/5ML syrup Take 5 mLs by mouth 3 (three) times daily as needed for cough. 07/10/18   Triplett, Cari B, FNP  lidocaine (XYLOCAINE) 2 % solution Use as directed 5 mLs in the mouth or throat every 6 (six) hours as needed for mouth pain. Patient not taking: Reported on 05/13/2020 01/14/18   Sable Feil, PA-C  magic mouthwash w/lidocaine SOLN Take 5 mLs by mouth 4 (four) times daily. Patient not taking: Reported on 05/13/2020 09/10/17   Cuthriell, Charline Bills, PA-C  medroxyPROGESTERone (PROVERA) 10 MG tablet Take 1 tablet (10 mg total) by mouth daily. Patient not taking: Reported on 05/13/2020 07/04/17   Malachy Mood, MD  meloxicam (MOBIC) 7.5 MG tablet Take 7.5 mg by mouth daily. Patient not taking: Reported on 05/13/2020    [provider]  metFORMIN (GLUCOPHAGE) 1000 MG tablet Take 1,000 mg by mouth 2 (two) times daily. 03/09/20   [provider]  predniSONE (DELTASONE) 10 MG tablet Take 6 tablets on day 1, take 5 tablets on day 2, take 4 tablets on day 3, take 3 tablets on day 4, take 2 tablets on day 5, take 1 tablet on day 6 Patient not taking: Reported on 05/13/2020 10/20/19   Laban Emperor, PA-C  varenicline (CHANTIX) 1 MG tablet Take 1 mg by mouth 2 (two) times daily. Patient not taking: Reported on 05/13/2020    [provider]    Physical Exam: Vitals:   06/06/22 1242 06/06/22 1539 06/06/22 1630 06/06/22 1732  BP: (!) 136/99  (!) 155/104 (!) 163/98  Pulse: 98  98 98  Resp: (!) 24  (!) 23 20  Temp: 98.4 F (36.9 C)  98.2 F (36.8 C) 98.3 F (36.8 C)  TempSrc: Oral  Oral Oral  SpO2: 90%  91% 96%  Weight:  136 kg    Height:  '4\' 11"'$  (1.499 m)     Physical Exam Vitals and nursing note reviewed.  Constitutional:      Appearance:  She is morbidly obese.  HENT:     Head: Normocephalic and atraumatic.     Mouth/Throat:     Mouth: Mucous membranes are moist.     Pharynx: No pharyngeal swelling or oropharyngeal exudate.     Comments: Oropharynx is clear with no obstruction noted. Eyes:     Extraocular Movements: Extraocular movements intact.     Pupils: Pupils are equal, round, and reactive to light.  Neck:     Comments: Unable to assess for JVD due to body habitus Cardiovascular:     Rate and Rhythm: Normal rate and regular rhythm.     Heart sounds: No murmur heard. Pulmonary:     Effort: Tachypnea and accessory muscle usage present.     Breath sounds: Decreased breath sounds (Decreased breath sounds throughout, likely in  the setting of habitus) and rhonchi present. No wheezing or rales.  Abdominal:     General: Bowel sounds are normal. There is no distension.     Palpations: Abdomen is soft.     Tenderness: There is no abdominal tenderness. There is no guarding.  Musculoskeletal:     Cervical back: Neck supple.     Right lower leg: 1+ Pitting Edema present.     Left lower leg: 1+ Pitting Edema present.  Skin:    General: Skin is warm and dry.  Neurological:     General: No focal deficit present.     Mental Status: She is alert and oriented to person, place, and time.     Motor: No weakness.  Psychiatric:        Mood and Affect: Mood normal.        Behavior: Behavior normal. Behavior is cooperative.    Data Reviewed: CBC with WBC of 14.4, hemoglobin of 12.0, MCV of 69, platelets of 312 CMP with sodium of 134, potassium 3.9, chloride of 93, bicarb of 31, glucose of 189, creatinine 0.52, anion gap 10, albumin 3.4, AST 16, ALT 15, GFR calculated above 60 BNP mildly elevated at 103 Initial troponin 22 COVID-19, influenza and RSV PCR negative Group A strep PCR negative   EKG personally reviewed.  Sinus rhythm with rate of 104.  PVCs noted  DG Chest 2 View  Result Date: 06/06/2022 CLINICAL DATA:   Shortness of breath, cough. EXAM: CHEST - 2 VIEW COMPARISON:  Chest x-ray dated 07/20/2021. FINDINGS: Borderline cardiomegaly. Mild prominence of the central bronchovascular markings, similar to previous exam. Lungs appear otherwise clear. No confluent opacity to suggest a consolidating pneumonia. No pleural effusion or pneumothorax is seen. Osseous structures about the chest are unremarkable. IMPRESSION: 1. No active cardiopulmonary disease. No evidence of pneumonia or pulmonary edema. 2. Bronchitis/reactive airways disease, likely chronic. 3. Borderline cardiomegaly. Electronically Signed   By: Franki Cabot M.D.   On: 06/06/2022 13:17    There are no new results to review at this time.  Assessment and Plan: * Acute respiratory failure with hypoxia and hypercapnia (HCC) Patient presenting with several month history of dyspnea both at rest and with exertion in addition to lower extremity edema and orthopnea.  CTA was obtained that did not show any evidence of PE or primary pulmonary disease, however notable for cardiomegaly with enlarged main pulmonary artery and reflux of contrast into the IVC.  BNP is only minimally elevated, which may be falsely low in the setting of morbid obesity.  Differential at this time predominantly right-sided heart failure +/- pulmonary hypertension.  This may be in the setting of untreated OSA as patient is unable to tolerate her CPAP with likely overlying obesity hypoventilation syndrome.  VBG demonstrated elevated pCO2, that is somewhat compensated with elevated bicarb, however will place BiPAP due to increased work of breathing.  - BiPAP as needed - Continue supplemental oxygen to maintain oxygen saturation above 88% - TSH pending - Echocardiogram ordered - S/p Lasix IV once - Strict in and out - Daily weights  Obstructive sleep apnea States she is unable to tolerate her CPAP at home.  Type 2 diabetes mellitus (HCC) - A1c pending - Hold home metformin - SSI,  moderate  Asthma Patient has a history of mild intermittent asthma on Flovent.  No wheezing on examination at this time and history is not consistent with asthma exacerbation.  -Continue home Flovent  Essential hypertension - Continue home antihypertensives  Advance Care Planning:   Code Status: Full Code.  Verified by patient  Consults: None at this time  Family Communication: No family at bedside  Severity of Illness: The appropriate patient status for this patient is OBSERVATION. Observation status is judged to be reasonable and necessary in order to provide the required intensity of service to ensure the patient's safety. The patient's presenting symptoms, physical exam findings, and initial radiographic and laboratory data in the context of their medical condition is felt to place them at decreased risk for further clinical deterioration. Furthermore, it is anticipated that the patient will be medically stable for discharge from the hospital within 2 midnights of admission.   Author: Jose Persia, MD 06/06/2022 8:28 PM  For on call review [email or web address].

## 2022-06-06 NOTE — ED Triage Notes (Signed)
Pt sts that she has been SOB for that last couple of months. Pt is coughing up mucous but it is thick like jelly. Pt sts " Everyone is treating this like asthma but it is not a asthma flare up. I can only sleep in a office chair cause that is the only place it is comfortable to sleep."

## 2022-06-06 NOTE — Assessment & Plan Note (Signed)
-  A1c pending - Hold home metformin - SSI, moderate

## 2022-06-06 NOTE — ED Provider Notes (Signed)
----------------------------------------- 3:30 PM on 06/06/2022 -----------------------------------------  Blood pressure (!) 136/99, pulse 98, temperature 98.4 F (36.9 C), temperature source Oral, resp. rate (!) 24, weight 136.1 kg, last menstrual period 05/30/2022, SpO2 90 %.  Assuming care from S. Fisher, PA-C.  In short, Desiree Stone is a 47 y.o. female with a chief complaint of Shortness of Breath That has been worsening for the past 5 months, worsening over the past 2 days.  Patient reportedly could hardly walk down the hallway at school and had to stop and rest given her shortness of breath.  She is not oxygen requiring at home, the requiring oxygen in the ER.  Refer to the original H&P for additional details.  When I evaluated the patient, she was tachypneic, and desaturating into the 80s on 3 to 4 L of oxygen via nasal cannula.  I increased her to 4L and she has maintained oxygen saturation in the mid 90s at rest.  She has decreased lung sounds throughout.  The current plan of care is to await CTA chest and soft tissue neck and admit to the hospitalist.  ____________________________________________    ED Results / Procedures / Treatments   Labs (all labs ordered are listed, but only abnormal results are displayed) Labs Reviewed  CBC - Abnormal; Notable for the following components:      Result Value   WBC 14.4 (*)    RBC 6.67 (*)    HCT 46.2 (*)    MCV 69.3 (*)    MCH 18.0 (*)    MCHC 26.0 (*)    RDW 22.0 (*)    nRBC 1.2 (*)    All other components within normal limits  COMPREHENSIVE METABOLIC PANEL - Abnormal; Notable for the following components:   Sodium 134 (*)    Chloride 93 (*)    Glucose, Bld 189 (*)    Calcium 8.6 (*)    Albumin 3.4 (*)    All other components within normal limits  BRAIN NATRIURETIC PEPTIDE - Abnormal; Notable for the following components:   B Natriuretic Peptide 103.6 (*)    All other components within normal limits  TROPONIN I (HIGH  SENSITIVITY) - Abnormal; Notable for the following components:   Troponin I (High Sensitivity) 22 (*)    All other components within normal limits  TROPONIN I (HIGH SENSITIVITY) - Abnormal; Notable for the following components:   Troponin I (High Sensitivity) 21 (*)    All other components within normal limits  RESP PANEL BY RT-PCR (RSV, FLU A&B, COVID)  RVPGX2  GROUP A STREP BY PCR  HIV ANTIBODY (ROUTINE TESTING W REFLEX)  BLOOD GAS, VENOUS  CBC  BASIC METABOLIC PANEL  TSH     EKG     RADIOLOGY  I independently reviewed and interpreted imaging and agree with radiologists findings.   DG Chest 2 View  Result Date: 06/06/2022 CLINICAL DATA:  Shortness of breath, cough. EXAM: CHEST - 2 VIEW COMPARISON:  Chest x-ray dated 07/20/2021. FINDINGS: Borderline cardiomegaly. Mild prominence of the central bronchovascular markings, similar to previous exam. Lungs appear otherwise clear. No confluent opacity to suggest a consolidating pneumonia. No pleural effusion or pneumothorax is seen. Osseous structures about the chest are unremarkable. IMPRESSION: 1. No active cardiopulmonary disease. No evidence of pneumonia or pulmonary edema. 2. Bronchitis/reactive airways disease, likely chronic. 3. Borderline cardiomegaly. Electronically Signed   By: Franki Cabot M.D.   On: 06/06/2022 13:17     PROCEDURES:  Critical Care performed: Yes, see critical care  procedure note(s)  .Critical Care  Performed by: Marquette Old, PA-C Authorized by: Marquette Old, PA-C   Critical care provider statement:    Critical care time (minutes):  35   Critical care time was exclusive of:  Separately billable procedures and treating other patients   Critical care was necessary to treat or prevent imminent or life-threatening deterioration of the following conditions:  Respiratory failure   Critical care was time spent personally by me on the following activities:  Discussions with consultants, evaluation of  patient's response to treatment, examination of patient, obtaining history from patient or surrogate, ordering and performing treatments and interventions, ordering and review of laboratory studies, ordering and review of radiographic studies, pulse oximetry, re-evaluation of patient's condition and review of old charts   I assumed direction of critical care for this patient from another provider in my specialty: yes     Care discussed with: admitting provider      MEDICATIONS ORDERED IN ED: Medications  methylPREDNISolone sodium succinate (SOLU-MEDROL) 125 mg/2 mL injection 125 mg (125 mg Intravenous Given 06/06/22 1635)  enoxaparin (LOVENOX) injection 67.5 mg (has no administration in time range)  sodium chloride flush (NS) 0.9 % injection 3 mL (has no administration in time range)  acetaminophen (TYLENOL) tablet 650 mg (has no administration in time range)    Or  acetaminophen (TYLENOL) suppository 650 mg (has no administration in time range)  polyethylene glycol (MIRALAX / GLYCOLAX) packet 17 g (has no administration in time range)  ondansetron (ZOFRAN) tablet 4 mg (has no administration in time range)    Or  ondansetron (ZOFRAN) injection 4 mg (has no administration in time range)  multivitamin with minerals tablet 1 tablet (has no administration in time range)  ipratropium-albuterol (DUONEB) 0.5-2.5 (3) MG/3ML nebulizer solution 3 mL (3 mLs Nebulization Given 06/06/22 1243)  iohexol (OMNIPAQUE) 350 MG/ML injection 100 mL (100 mLs Intravenous Contrast Given 06/06/22 1520)  furosemide (LASIX) injection 40 mg (40 mg Intravenous Given 06/06/22 1730)     IMPRESSION / MDM / [place] / ED COURSE  I reviewed the triage vital signs and the nursing notes.                              Differential diagnosis includes, but is not limited to, COPD exacerbation, new onset heart failure, myocardial ischemia, pneumonia, cor pulmonale, pulmonary embolism.  Patient's presentation is most  consistent with acute presentation with potential threat to life or bodily function.  Given patient's history of smoking 2 packs/day, I gave her a dose of Solu-Medrol to see if there is a component of reactive airways though she does not have a diagnosis of COPD.  I also added on cardiac biomarkers.  Upon reevaluation, patient does report that this improved her symptoms.  After discussion with Dr. Charleen Kirks, patient was also given a dose of Lasix as there may be a component of heart failure as well.  The patient is on the cardiac monitor to evaluate for evidence of arrhythmia and/or significant heart rate changes.  She is also on continuous pulse oximetry.  Patient's diagnosis is consistent with hypoxia and new oxygen requirement. Patient will be admitted to the hospitalist service.  She was accepted by Dr. Charleen Kirks.   Clinical Course as of 06/06/22 2015  Tue Jun 06, 2022  1652 Cannot see the CT chest image or reading but discussed with tech who read report: No PE. Cardiomegaly present with  pulm HTN suggesting right heart dysfunction  [JP]    Clinical Course User Index [JP] Fadia Marlar, Clarnce Flock, PA-C    FINAL CLINICAL IMPRESSION(S) / ED DIAGNOSES   Final diagnoses:  Acute respiratory distress  Hypoxia     Rx / DC Orders   ED Discharge Orders     None        Note:  This document was prepared using Dragon voice recognition software and may include unintentional dictation errors.    Emeline Gins 06/06/22 2015    Merlyn Lot, MD 06/06/22 2111

## 2022-06-06 NOTE — ED Notes (Signed)
See triage note Presents with some increased SOB over the past couple of days No fever

## 2022-06-06 NOTE — Assessment & Plan Note (Signed)
-  Continue home antihypertensives 

## 2022-06-06 NOTE — ED Notes (Signed)
The pt ambulated down just past the restroom without O2. The pt's pulse ox was 85 on room air while ambulating back to her room.

## 2022-06-06 NOTE — Assessment & Plan Note (Signed)
Patient has a history of mild intermittent asthma on Flovent.  No wheezing on examination at this time and history is not consistent with asthma exacerbation.  -Continue home Flovent

## 2022-06-06 NOTE — ED Provider Notes (Signed)
[facility] Provider Note    Event Date/Time   First MD Initiated Contact with Patient 06/06/22 1342     (approximate)   History   Shortness of Breath   HPI  Desiree Stone is a 47 y.o. female with history of asthma, obesity, OSA and diabetes along with hypertension presents emergency department with increasingly shortness of breath.  Patient states that she is becoming more short of breath over the past few months.  Worse in the last couple of days.  States she could hardly walk down the hallway at the school and had to stop and rest.  Patient states that she does not wear oxygen at home.  She has been unable to use her CPAP as she is unable to sleep in her bed.  States she has been sleeping in a chair for several weeks.  Patient does endorse smoking cigarettes      Physical Exam   Triage Vital Signs: ED Triage Vitals  Enc Vitals Group     BP 06/06/22 1242 (!) 136/99     Pulse Rate 06/06/22 1242 98     Resp 06/06/22 1242 (!) 24     Temp 06/06/22 1242 98.4 F (36.9 C)     Temp Source 06/06/22 1242 Oral     SpO2 06/06/22 1242 90 %     Weight 06/06/22 1233 300 lb (136.1 kg)     Height --      Head Circumference --      Peak Flow --      Pain Score 06/06/22 1233 0     Pain Loc --      Pain Edu? --      Excl. in [place]? --     Most recent vital signs: Vitals:   06/06/22 1242  BP: (!) 136/99  Pulse: 98  Resp: (!) 24  Temp: 98.4 F (36.9 C)  SpO2: 90%     General: Awake, no distress.   CV:  Good peripheral perfusion. regular rate and  rhythm Resp:  Increased effort. Lungs CTA Abd:  No distention.   Other:      ED Results / Procedures / Treatments   Labs (all labs ordered are listed, but only abnormal results are displayed) Labs Reviewed  CBC - Abnormal; Notable for the following components:      Result Value   WBC 14.4 (*)    RBC 6.67 (*)    HCT 46.2 (*)    MCV 69.3 (*)    MCH 18.0 (*)    MCHC 26.0 (*)    RDW 22.0 (*)     nRBC 1.2 (*)    All other components within normal limits  COMPREHENSIVE METABOLIC PANEL - Abnormal; Notable for the following components:   Sodium 134 (*)    Chloride 93 (*)    Glucose, Bld 189 (*)    Calcium 8.6 (*)    Albumin 3.4 (*)    All other components within normal limits  RESP PANEL BY RT-PCR (RSV, FLU A&B, COVID)  RVPGX2  GROUP A STREP BY PCR  BRAIN NATRIURETIC PEPTIDE     EKG  EKG   RADIOLOGY Chest x-ray CT soft tissue of the neck CTA for PE    PROCEDURES:   .Critical Care E&M  Performed by: Versie Starks, PA-C Critical care provider statement:    Critical care time (minutes):  45   Critical care time was exclusive of:  Separately billable procedures and treating other patients  Critical care was necessary to treat or prevent imminent or life-threatening deterioration of the following conditions:  Respiratory failure   Critical care was time spent personally by me on the following activities:  Development of treatment plan with patient or surrogate, evaluation of patient's response to treatment, examination of patient, obtaining history from patient or surrogate, ordering and performing treatments and interventions, ordering and review of laboratory studies, ordering and review of radiographic studies, pulse oximetry, re-evaluation of patient's condition and review of old charts   I assumed direction of critical care for this patient from another provider in my specialty: no   After initial E/M assessment, critical care services were subsequently performed that were exclusive of separately billable procedures or treatment.      MEDICATIONS ORDERED IN ED: Medications  ipratropium-albuterol (DUONEB) 0.5-2.5 (3) MG/3ML nebulizer solution 3 mL (3 mLs Nebulization Given 06/06/22 1243)     IMPRESSION / MDM / ASSESSMENT AND PLAN / ED COURSE  I reviewed the triage vital signs and the nursing notes.                              Differential diagnosis  includes, but is not limited to, COPD, asthma, PE, nodules or polyps in the throat, COVID, influenza, RSV, CHF, respiratory distress, hypoxia  Patient's presentation is most consistent with acute presentation with potential threat to life or bodily function.   Patient does not wear oxygen at home, pulse ox in triage was 90% on room air, they did do a DuoNeb nebulizer treatment, placed patient on 2 L of O2  When she was brought back to the flex area her resting O2 while sitting is at 90% on room air, I did have nursing staff ambulate her and her oxygen decreases to 85% room air.  We placed the patient back on 2 to 3 L of O2.  The chest x-ray was independently reviewed and interpreted by me as being negative for acute abnormality, however there is some questionable vascular congestion which would concern me for CHF.  Will add a BNP to her workup to assess for congestive heart failure.  Also concern to the new oxygen requirement and shortness of breath will do CTA for PE along with soft tissue of the neck to assess for polyps which may be obstructing her airway  Respiratory panel is reassuring, strep test reassuring  Metabolic panel shows a glucose elevation of 189 which is in the patient's normal range  CBC shows elevation of WBCs of 14.4 which could indicate an occult infection  EKG did show sinus tachycardia with PVCs, see physician read   CTA for PE and CT soft tissue of the neck are still pending.  Care being transferred to [facility], PA-C at shift change.  Plan is to coordinate admission with hospitalist following CTs due to respiratory distress and new O2 requirement   FINAL CLINICAL IMPRESSION(S) / ED DIAGNOSES   Final diagnoses:  Acute respiratory distress  Hypoxia     Rx / DC Orders   ED Discharge Orders     None        Note:  This document was prepared using Dragon voice recognition software and may include unintentional dictation errors.    Versie Starks,  PA-C 06/06/22 1506    Lucillie Garfinkel, MD 06/07/22 [phone]

## 2022-06-06 NOTE — Progress Notes (Signed)
PHARMACIST - PHYSICIAN COMMUNICATION  CONCERNING:  Enoxaparin (Lovenox) for DVT Prophylaxis    RECOMMENDATION: Patient was prescribed enoxaparin '40mg'$  q24 hours for VTE prophylaxis.   Filed Weights   06/06/22 1233 06/06/22 1539  Weight: 136.1 kg (300 lb) 136 kg (299 lb 13.2 oz)    Body mass index is 60.56 kg/m.  Estimated Creatinine Clearance: 109 mL/min (by C-G formula based on SCr of 0.52 mg/dL).   Based on [place] patient is candidate for enoxaparin 0.'5mg'$ /kg TBW SQ every 24 hours based on BMI being >30.  DESCRIPTION: Pharmacy has adjusted enoxaparin dose per [facility] policy.  Patient is now receiving enoxaparin 0.5 mg/kg every 24 hours    Will M. Ouida Sills, PharmD PGY-1 Pharmacy Resident 06/06/2022 7:32 PM

## 2022-06-06 NOTE — ED Notes (Signed)
This RN to bedside, respiratory at bedside for bipap as well.

## 2022-06-06 NOTE — ED Notes (Signed)
Upon entering room, RN observed patient pull BiPap  and pulse oximetry off. Patient somnolent and only would open eyes and grab RN's hand to painful stimulation. RN placed patient back on BiPap and pulse oximetry. Noted to have oxygen saturation at 85%. Dr. Sidney Ace messaged via secure chat to notify. Patient finally became responsive to painful stimuli and began answering questions and following commands. Oxygen saturation now 95% on BiPap. Patient became tearful, emotional support provided. Patient's sister called while RN at bedside and phone held up to patient so sister could say goodnight. Patient now calm and denies additional needs.

## 2022-06-06 NOTE — Progress Notes (Signed)
Patient placed on BIPAP 15/5 32% ST 18. SpO2 94%, HR 107, RR 30. Tolerating well. PT has decreased WOB, and reports improvement.

## 2022-06-06 NOTE — Assessment & Plan Note (Signed)
States she is unable to tolerate her CPAP at home.

## 2022-06-07 ENCOUNTER — Inpatient Hospital Stay: Payer: Medicaid Other

## 2022-06-07 ENCOUNTER — Inpatient Hospital Stay
Admit: 2022-06-07 | Discharge: 2022-06-07 | Disposition: A | Discharge status: Home / Self Care | Payer: Medicaid Other | Attending: Pulmonary Disease | Admitting: Pulmonary Disease

## 2022-06-07 DIAGNOSIS — R4589 Other symptoms and signs involving emotional state: Secondary | ICD-10-CM | POA: Diagnosis not present

## 2022-06-07 DIAGNOSIS — J9601 Acute respiratory failure with hypoxia: Secondary | ICD-10-CM | POA: Diagnosis present

## 2022-06-07 DIAGNOSIS — J441 Chronic obstructive pulmonary disease with (acute) exacerbation: Secondary | ICD-10-CM

## 2022-06-07 DIAGNOSIS — D649 Anemia, unspecified: Secondary | ICD-10-CM | POA: Diagnosis present

## 2022-06-07 DIAGNOSIS — I10 Essential (primary) hypertension: Secondary | ICD-10-CM | POA: Diagnosis not present

## 2022-06-07 DIAGNOSIS — D509 Iron deficiency anemia, unspecified: Secondary | ICD-10-CM | POA: Diagnosis not present

## 2022-06-07 DIAGNOSIS — J189 Pneumonia, unspecified organism: Secondary | ICD-10-CM | POA: Diagnosis not present

## 2022-06-07 DIAGNOSIS — L02211 Cutaneous abscess of abdominal wall: Secondary | ICD-10-CM | POA: Diagnosis not present

## 2022-06-07 DIAGNOSIS — J9602 Acute respiratory failure with hypercapnia: Secondary | ICD-10-CM | POA: Diagnosis present

## 2022-06-07 DIAGNOSIS — I2489 Other forms of acute ischemic heart disease: Secondary | ICD-10-CM | POA: Diagnosis present

## 2022-06-07 DIAGNOSIS — F17210 Nicotine dependence, cigarettes, uncomplicated: Secondary | ICD-10-CM | POA: Diagnosis present

## 2022-06-07 DIAGNOSIS — G928 Other toxic encephalopathy: Secondary | ICD-10-CM | POA: Diagnosis not present

## 2022-06-07 DIAGNOSIS — R1310 Dysphagia, unspecified: Secondary | ICD-10-CM | POA: Diagnosis not present

## 2022-06-07 DIAGNOSIS — J4521 Mild intermittent asthma with (acute) exacerbation: Secondary | ICD-10-CM | POA: Diagnosis present

## 2022-06-07 DIAGNOSIS — I110 Hypertensive heart disease with heart failure: Secondary | ICD-10-CM | POA: Diagnosis present

## 2022-06-07 DIAGNOSIS — E662 Morbid (severe) obesity with alveolar hypoventilation: Secondary | ICD-10-CM | POA: Diagnosis present

## 2022-06-07 DIAGNOSIS — J45901 Unspecified asthma with (acute) exacerbation: Secondary | ICD-10-CM

## 2022-06-07 DIAGNOSIS — E785 Hyperlipidemia, unspecified: Secondary | ICD-10-CM | POA: Diagnosis present

## 2022-06-07 DIAGNOSIS — J45909 Unspecified asthma, uncomplicated: Secondary | ICD-10-CM | POA: Diagnosis not present

## 2022-06-07 DIAGNOSIS — E876 Hypokalemia: Secondary | ICD-10-CM | POA: Diagnosis not present

## 2022-06-07 DIAGNOSIS — I509 Heart failure, unspecified: Secondary | ICD-10-CM | POA: Diagnosis not present

## 2022-06-07 DIAGNOSIS — E119 Type 2 diabetes mellitus without complications: Secondary | ICD-10-CM | POA: Diagnosis not present

## 2022-06-07 DIAGNOSIS — E871 Hypo-osmolality and hyponatremia: Secondary | ICD-10-CM | POA: Diagnosis present

## 2022-06-07 DIAGNOSIS — E1169 Type 2 diabetes mellitus with other specified complication: Secondary | ICD-10-CM | POA: Diagnosis not present

## 2022-06-07 DIAGNOSIS — R0602 Shortness of breath: Secondary | ICD-10-CM | POA: Diagnosis present

## 2022-06-07 DIAGNOSIS — J449 Chronic obstructive pulmonary disease, unspecified: Secondary | ICD-10-CM | POA: Diagnosis not present

## 2022-06-07 DIAGNOSIS — E1165 Type 2 diabetes mellitus with hyperglycemia: Secondary | ICD-10-CM | POA: Diagnosis present

## 2022-06-07 DIAGNOSIS — E8729 Other acidosis: Secondary | ICD-10-CM | POA: Diagnosis present

## 2022-06-07 DIAGNOSIS — D72829 Elevated white blood cell count, unspecified: Secondary | ICD-10-CM | POA: Diagnosis present

## 2022-06-07 DIAGNOSIS — J440 Chronic obstructive pulmonary disease with acute lower respiratory infection: Secondary | ICD-10-CM | POA: Diagnosis not present

## 2022-06-07 DIAGNOSIS — Z1152 Encounter for screening for COVID-19: Secondary | ICD-10-CM | POA: Diagnosis not present

## 2022-06-07 DIAGNOSIS — R5381 Other malaise: Secondary | ICD-10-CM | POA: Diagnosis not present

## 2022-06-07 DIAGNOSIS — G9341 Metabolic encephalopathy: Secondary | ICD-10-CM | POA: Diagnosis not present

## 2022-06-07 DIAGNOSIS — J9622 Acute and chronic respiratory failure with hypercapnia: Secondary | ICD-10-CM | POA: Diagnosis not present

## 2022-06-07 DIAGNOSIS — J9600 Acute respiratory failure, unspecified whether with hypoxia or hypercapnia: Secondary | ICD-10-CM | POA: Diagnosis present

## 2022-06-07 DIAGNOSIS — Y95 Nosocomial condition: Secondary | ICD-10-CM | POA: Diagnosis not present

## 2022-06-07 DIAGNOSIS — R0600 Dyspnea, unspecified: Secondary | ICD-10-CM | POA: Diagnosis not present

## 2022-06-07 DIAGNOSIS — I5031 Acute diastolic (congestive) heart failure: Secondary | ICD-10-CM | POA: Diagnosis present

## 2022-06-07 DIAGNOSIS — G4733 Obstructive sleep apnea (adult) (pediatric): Secondary | ICD-10-CM | POA: Diagnosis not present

## 2022-06-07 DIAGNOSIS — R197 Diarrhea, unspecified: Secondary | ICD-10-CM | POA: Diagnosis not present

## 2022-06-07 DIAGNOSIS — Z6841 Body Mass Index (BMI) 40.0 and over, adult: Secondary | ICD-10-CM | POA: Diagnosis not present

## 2022-06-07 DIAGNOSIS — E278 Other specified disorders of adrenal gland: Secondary | ICD-10-CM | POA: Diagnosis present

## 2022-06-07 LAB — BLOOD GAS, ARTERIAL
Acid-Base Excess: 10.4 mmol/L — ABNORMAL HIGH (ref 0.0–2.0)
Acid-Base Excess: 12.2 mmol/L — ABNORMAL HIGH (ref 0.0–2.0)
Acid-Base Excess: 14.8 mmol/L — ABNORMAL HIGH (ref 0.0–2.0)
Acid-Base Excess: 12.2 mmol/L — ABNORMAL HIGH (ref 0.0–2.0)
Bicarbonate: 40.9 mmol/L — ABNORMAL HIGH (ref 20.0–28.0)
Bicarbonate: 42.8 mmol/L — ABNORMAL HIGH (ref 20.0–28.0)
Bicarbonate: 39.3 mmol/L — ABNORMAL HIGH (ref 20.0–28.0)
Bicarbonate: 38.9 mmol/L — ABNORMAL HIGH (ref 20.0–28.0)
Delivery systems: POSITIVE
Delivery systems: POSITIVE
Expiratory PAP: 5 cmH2O
Expiratory PAP: 5 cmH2O
FIO2: 32 %
FIO2: 36 %
FIO2: 60 %
FIO2: 60 %
Inspiratory PAP: 15 cmH2O
MECHVT: 480 mL
MECHVT: 480 mL
MECHVT: 380 mL
Mechanical Rate: 24
Mechanical Rate: 24
O2 Saturation: 92.6 %
O2 Saturation: 93.5 %
O2 Saturation: 93.8 %
O2 Saturation: 95.7 %
PEEP: 5 cmH2O
PEEP: 8 cmH2O
Patient temperature: 37
Patient temperature: 37
Patient temperature: 37
Patient temperature: 37
pCO2 arterial: 87 mmHg (ref 32–48)
pCO2 arterial: 89 mmHg (ref 32–48)
pCO2 arterial: 46 mmHg (ref 32–48)
pCO2 arterial: 60 mmHg — ABNORMAL HIGH (ref 32–48)
pH, Arterial: 7.28 — ABNORMAL LOW (ref 7.35–7.45)
pH, Arterial: 7.29 — ABNORMAL LOW (ref 7.35–7.45)
pH, Arterial: 7.54 — ABNORMAL HIGH (ref 7.35–7.45)
pH, Arterial: 7.42 (ref 7.35–7.45)
pO2, Arterial: 64 mmHg — ABNORMAL LOW (ref 83–108)
pO2, Arterial: 69 mmHg — ABNORMAL LOW (ref 83–108)
pO2, Arterial: 59 mmHg — ABNORMAL LOW (ref 83–108)
pO2, Arterial: 68 mmHg — ABNORMAL LOW (ref 83–108)

## 2022-06-07 LAB — CBC
HCT: 47.2 % — ABNORMAL HIGH (ref 36.0–46.0)
Hemoglobin: 12 g/dL (ref 12.0–15.0)
MCH: 18.2 pg — ABNORMAL LOW (ref 26.0–34.0)
MCHC: 25.4 g/dL — ABNORMAL LOW (ref 30.0–36.0)
MCV: 71.5 fL — ABNORMAL LOW (ref 80.0–100.0)
Platelets: 297 10*3/uL (ref 150–400)
RBC: 6.6 MIL/uL — ABNORMAL HIGH (ref 3.87–5.11)
RDW: 22.1 % — ABNORMAL HIGH (ref 11.5–15.5)
WBC: 14.9 10*3/uL — ABNORMAL HIGH (ref 4.0–10.5)
nRBC: 0.8 % — ABNORMAL HIGH (ref 0.0–0.2)

## 2022-06-07 LAB — LIPID PANEL
Cholesterol: 134 mg/dL (ref 0–200)
HDL: 34 mg/dL — ABNORMAL LOW (ref >40)
LDL Cholesterol: 83 mg/dL (ref 0–99)
Total CHOL/HDL Ratio: 3.9 RATIO
Triglycerides: 86 mg/dL (ref <150)
VLDL: 17 mg/dL (ref 0–40)

## 2022-06-07 LAB — RESPIRATORY PANEL BY PCR

## 2022-06-07 LAB — BASIC METABOLIC PANEL
Anion gap: 9 (ref 5–15)
BUN: 9 mg/dL (ref 6–20)
CO2: 34 mmol/L — ABNORMAL HIGH (ref 22–32)
Calcium: 8.9 mg/dL (ref 8.9–10.3)
Chloride: 94 mmol/L — ABNORMAL LOW (ref 98–111)
Creatinine, Ser: 0.47 mg/dL (ref 0.44–1.00)
GFR, Estimated: >60 mL/min (ref >60)
Glucose, Bld: 228 mg/dL — ABNORMAL HIGH (ref 70–99)
Potassium: 4.9 mmol/L (ref 3.5–5.1)
Sodium: 137 mmol/L (ref 135–145)

## 2022-06-07 LAB — GLUCOSE, CAPILLARY
Glucose-Capillary: 257 mg/dL — ABNORMAL HIGH (ref 70–99)
Glucose-Capillary: 213 mg/dL — ABNORMAL HIGH (ref 70–99)
Glucose-Capillary: 92 mg/dL (ref 70–99)
Glucose-Capillary: 196 mg/dL — ABNORMAL HIGH (ref 70–99)
Glucose-Capillary: 220 mg/dL — ABNORMAL HIGH (ref 70–99)
Glucose-Capillary: 162 mg/dL — ABNORMAL HIGH (ref 70–99)

## 2022-06-07 LAB — HIV ANTIBODY (ROUTINE TESTING W REFLEX): HIV Screen 4th Generation wRfx: NONREACTIVE

## 2022-06-07 LAB — HEMOGLOBIN A1C
Hgb A1c MFr Bld: 9 % — ABNORMAL HIGH (ref 4.8–5.6)
Mean Plasma Glucose: 211.6 mg/dL

## 2022-06-07 LAB — PHOSPHORUS: Phosphorus: 4.8 mg/dL — ABNORMAL HIGH (ref 2.5–4.6)

## 2022-06-07 LAB — PROCALCITONIN: Procalcitonin: <0.1 ng/mL

## 2022-06-07 LAB — MAGNESIUM: Magnesium: 2.2 mg/dL (ref 1.7–2.4)

## 2022-06-07 LAB — MRSA NEXT GEN BY PCR, NASAL: MRSA by PCR Next Gen: NOT DETECTED

## 2022-06-07 MED ORDER — FENTANYL CITRATE PF 50 MCG/ML IJ SOSY
50.0000 ug | PREFILLED_SYRINGE | INTRAMUSCULAR | Status: DC | PRN
  Administered 2022-06-07 (×2): 50 ug via INTRAVENOUS
  Filled 2022-06-07 (×2): qty 1

## 2022-06-07 MED ORDER — AZITHROMYCIN 250 MG PO TABS
250.0000 mg | ORAL_TABLET | Freq: Every day | ORAL | Status: AC
  Administered 2022-06-07 – 2022-06-11 (×5): 250 mg
  Filled 2022-06-07 (×5): qty 1

## 2022-06-07 MED ORDER — VITAL HIGH PROTEIN PO LIQD
1000.0000 mL | ORAL | Status: DC
  Administered 2022-06-07 – 2022-06-12 (×6): 1000 mL

## 2022-06-07 MED ORDER — FENTANYL CITRATE PF 50 MCG/ML IJ SOSY
50.0000 ug | PREFILLED_SYRINGE | INTRAMUSCULAR | Status: DC | PRN
  Administered 2022-06-07: 200 ug via INTRAVENOUS
  Filled 2022-06-07: qty 4

## 2022-06-07 MED ORDER — ORAL CARE MOUTH RINSE
15.0000 mL | OROMUCOSAL | Status: DC
  Administered 2022-06-07 – 2022-06-22 (×176): 15 mL via OROMUCOSAL

## 2022-06-07 MED ORDER — PROPOFOL 1000 MG/100ML IV EMUL
INTRAVENOUS | Status: AC
  Administered 2022-06-07: 10 ug/kg/min
  Filled 2022-06-07: qty 100

## 2022-06-07 MED ORDER — PROPOFOL 1000 MG/100ML IV EMUL
0.0000 ug/kg/min | INTRAVENOUS | Status: DC
  Administered 2022-06-07: 50 ug/kg/min via INTRAVENOUS
  Administered 2022-06-07: 10 ug/kg/min via INTRAVENOUS
  Administered 2022-06-07 (×6): 50 ug/kg/min via INTRAVENOUS
  Administered 2022-06-08: 45 ug/kg/min via INTRAVENOUS
  Administered 2022-06-08 (×2): 50 ug/kg/min via INTRAVENOUS
  Administered 2022-06-08: 45 ug/kg/min via INTRAVENOUS
  Administered 2022-06-08: 50 ug/kg/min via INTRAVENOUS
  Administered 2022-06-08: 45 ug/kg/min via INTRAVENOUS
  Administered 2022-06-08: 50 ug/kg/min via INTRAVENOUS
  Administered 2022-06-08: 40 ug/kg/min via INTRAVENOUS
  Administered 2022-06-08 – 2022-06-09 (×2): 50 ug/kg/min via INTRAVENOUS
  Filled 2022-06-07 (×18): qty 100

## 2022-06-07 MED ORDER — FUROSEMIDE 10 MG/ML IJ SOLN
20.0000 mg | Freq: Two times a day (BID) | INTRAMUSCULAR | Status: DC
  Administered 2022-06-07 – 2022-06-08 (×3): 20 mg via INTRAVENOUS
  Filled 2022-06-07 (×3): qty 2

## 2022-06-07 MED ORDER — ROCURONIUM BROMIDE 10 MG/ML (PF) SYRINGE
PREFILLED_SYRINGE | INTRAVENOUS | Status: AC
  Administered 2022-06-07: 100 mg
  Filled 2022-06-07: qty 10

## 2022-06-07 MED ORDER — ACETAMINOPHEN 325 MG PO TABS
650.0000 mg | ORAL_TABLET | Freq: Four times a day (QID) | ORAL | Status: DC | PRN
  Administered 2022-06-10 – 2022-06-21 (×8): 650 mg
  Filled 2022-06-07 (×9): qty 2

## 2022-06-07 MED ORDER — FENTANYL CITRATE PF 50 MCG/ML IJ SOSY
100.0000 ug | PREFILLED_SYRINGE | Freq: Once | INTRAMUSCULAR | Status: AC
  Administered 2022-06-07: 100 ug via INTRAVENOUS

## 2022-06-07 MED ORDER — ETOMIDATE 2 MG/ML IV SOLN
INTRAVENOUS | Status: AC
  Administered 2022-06-07: 20 mg
  Filled 2022-06-07: qty 10

## 2022-06-07 MED ORDER — ORAL CARE MOUTH RINSE: 15.0000 mL | OROMUCOSAL | Status: DC | PRN

## 2022-06-07 MED ORDER — INSULIN ASPART 100 UNIT/ML IJ SOLN
0.0000 [IU] | INTRAMUSCULAR | Status: DC
  Administered 2022-06-07: 4 [IU] via SUBCUTANEOUS
  Administered 2022-06-07: 7 [IU] via SUBCUTANEOUS
  Administered 2022-06-07: 4 [IU] via SUBCUTANEOUS
  Administered 2022-06-08 (×3): 3 [IU] via SUBCUTANEOUS
  Administered 2022-06-08: 4 [IU] via SUBCUTANEOUS
  Administered 2022-06-08 – 2022-06-09 (×3): 7 [IU] via SUBCUTANEOUS
  Administered 2022-06-09 (×3): 3 [IU] via SUBCUTANEOUS
  Administered 2022-06-09: 7 [IU] via SUBCUTANEOUS
  Administered 2022-06-10: 11 [IU] via SUBCUTANEOUS
  Administered 2022-06-10: 4 [IU] via SUBCUTANEOUS
  Administered 2022-06-10: 3 [IU] via SUBCUTANEOUS
  Administered 2022-06-10 – 2022-06-11 (×3): 7 [IU] via SUBCUTANEOUS
  Administered 2022-06-11 (×2): 4 [IU] via SUBCUTANEOUS
  Administered 2022-06-11: 7 [IU] via SUBCUTANEOUS
  Administered 2022-06-12 (×3): 4 [IU] via SUBCUTANEOUS
  Administered 2022-06-12: 7 [IU] via SUBCUTANEOUS
  Administered 2022-06-12: 3 [IU] via SUBCUTANEOUS
  Administered 2022-06-13: 7 [IU] via SUBCUTANEOUS
  Administered 2022-06-13 (×2): 3 [IU] via SUBCUTANEOUS
  Administered 2022-06-13 (×2): 4 [IU] via SUBCUTANEOUS
  Administered 2022-06-13: 7 [IU] via SUBCUTANEOUS
  Administered 2022-06-14: 4 [IU] via SUBCUTANEOUS
  Administered 2022-06-14 (×3): 3 [IU] via SUBCUTANEOUS
  Administered 2022-06-14 – 2022-06-15 (×5): 4 [IU] via SUBCUTANEOUS
  Administered 2022-06-15: 7 [IU] via SUBCUTANEOUS
  Administered 2022-06-15 – 2022-06-16 (×3): 4 [IU] via SUBCUTANEOUS
  Administered 2022-06-16: 7 [IU] via SUBCUTANEOUS
  Administered 2022-06-16: 3 [IU] via SUBCUTANEOUS
  Administered 2022-06-16: 4 [IU] via SUBCUTANEOUS
  Administered 2022-06-16 (×2): 7 [IU] via SUBCUTANEOUS
  Administered 2022-06-17: 4 [IU] via SUBCUTANEOUS
  Administered 2022-06-17 (×3): 7 [IU] via SUBCUTANEOUS
  Administered 2022-06-17: 4 [IU] via SUBCUTANEOUS
  Administered 2022-06-17: 7 [IU] via SUBCUTANEOUS
  Administered 2022-06-18: 11 [IU] via SUBCUTANEOUS
  Administered 2022-06-18: 4 [IU] via SUBCUTANEOUS
  Administered 2022-06-18: 11 [IU] via SUBCUTANEOUS
  Administered 2022-06-18: 4 [IU] via SUBCUTANEOUS
  Administered 2022-06-18 (×2): 7 [IU] via SUBCUTANEOUS
  Administered 2022-06-19: 15 [IU] via SUBCUTANEOUS
  Administered 2022-06-19: 11 [IU] via SUBCUTANEOUS
  Administered 2022-06-19: 15 [IU] via SUBCUTANEOUS
  Administered 2022-06-19 (×2): 4 [IU] via SUBCUTANEOUS
  Administered 2022-06-19: 11 [IU] via SUBCUTANEOUS
  Administered 2022-06-20: 4 [IU] via SUBCUTANEOUS
  Administered 2022-06-20: 11 [IU] via SUBCUTANEOUS
  Administered 2022-06-20: 7 [IU] via SUBCUTANEOUS
  Administered 2022-06-20: 11 [IU] via SUBCUTANEOUS
  Administered 2022-06-20 – 2022-06-21 (×4): 7 [IU] via SUBCUTANEOUS
  Administered 2022-06-21: 15 [IU] via SUBCUTANEOUS
  Administered 2022-06-21 (×3): 11 [IU] via SUBCUTANEOUS
  Filled 2022-06-07 (×78): qty 1

## 2022-06-07 MED ORDER — MIDAZOLAM HCL 2 MG/2ML IJ SOLN
1.0000 mg | INTRAMUSCULAR | Status: DC | PRN
  Administered 2022-06-07 – 2022-06-13 (×17): 2 mg via INTRAVENOUS
  Filled 2022-06-07 (×17): qty 2

## 2022-06-07 MED ORDER — FREE WATER
30.0000 mL | Status: DC
  Administered 2022-06-07 – 2022-06-15 (×46): 30 mL

## 2022-06-07 MED ORDER — ADULT MULTIVITAMIN W/MINERALS CH
1.0000 | ORAL_TABLET | Freq: Every day | ORAL | Status: DC
  Administered 2022-06-07 – 2022-06-27 (×21): 1
  Filled 2022-06-07 (×21): qty 1

## 2022-06-07 MED ORDER — ATORVASTATIN CALCIUM 20 MG PO TABS
40.0000 mg | ORAL_TABLET | Freq: Every day | ORAL | Status: DC
  Administered 2022-06-07 – 2022-07-04 (×27): 40 mg
  Filled 2022-06-07 (×26): qty 2

## 2022-06-07 MED ORDER — FAMOTIDINE 20 MG PO TABS
20.0000 mg | ORAL_TABLET | Freq: Two times a day (BID) | ORAL | Status: DC
  Administered 2022-06-07 – 2022-06-22 (×32): 20 mg
  Filled 2022-06-07 (×32): qty 1

## 2022-06-07 MED ORDER — ONDANSETRON HCL 4 MG PO TABS: 4.0000 mg | ORAL_TABLET | Freq: Four times a day (QID) | ORAL | Status: DC | PRN

## 2022-06-07 MED ORDER — DOCUSATE SODIUM 50 MG/5ML PO LIQD
100.0000 mg | Freq: Two times a day (BID) | ORAL | Status: DC
  Administered 2022-06-07 – 2022-06-14 (×14): 100 mg
  Filled 2022-06-07 (×14): qty 10

## 2022-06-07 MED ORDER — ACETAMINOPHEN 650 MG RE SUPP
650.0000 mg | Freq: Four times a day (QID) | RECTAL | Status: DC | PRN
  Administered 2022-06-11: 650 mg via RECTAL
  Filled 2022-06-07: qty 1

## 2022-06-07 MED ORDER — INSULIN ASPART 100 UNIT/ML IJ SOLN
0.0000 [IU] | INTRAMUSCULAR | Status: DC
  Administered 2022-06-07: 5 [IU] via SUBCUTANEOUS
  Filled 2022-06-07: qty 1

## 2022-06-07 MED ORDER — ONDANSETRON HCL 4 MG/2ML IJ SOLN: 4.0000 mg | Freq: Four times a day (QID) | INTRAMUSCULAR | Status: DC | PRN

## 2022-06-07 MED ORDER — IPRATROPIUM-ALBUTEROL 0.5-2.5 (3) MG/3ML IN SOLN
3.0000 mL | Freq: Four times a day (QID) | RESPIRATORY_TRACT | Status: DC
  Administered 2022-06-07 – 2022-06-10 (×13): 3 mL via RESPIRATORY_TRACT
  Filled 2022-06-07 (×13): qty 3

## 2022-06-07 MED ORDER — PERFLUTREN LIPID MICROSPHERE
1.0000 mL | INTRAVENOUS | Status: AC | PRN
  Administered 2022-06-07: 5 mL via INTRAVENOUS

## 2022-06-07 MED ORDER — METHYLPREDNISOLONE SODIUM SUCC 40 MG IJ SOLR
40.0000 mg | Freq: Every day | INTRAMUSCULAR | Status: DC
  Administered 2022-06-07 – 2022-06-08 (×2): 40 mg via INTRAVENOUS
  Filled 2022-06-07 (×2): qty 1

## 2022-06-07 MED ORDER — CHLORHEXIDINE GLUCONATE CLOTH 2 % EX PADS
6.0000 | MEDICATED_PAD | Freq: Every day | CUTANEOUS | Status: DC
  Administered 2022-06-07 – 2022-06-10 (×4): 6 via TOPICAL
  Filled 2022-06-07: qty 6

## 2022-06-07 MED ORDER — POLYETHYLENE GLYCOL 3350 17 G PO PACK
17.0000 g | PACK | Freq: Every day | ORAL | Status: DC
  Administered 2022-06-07 – 2022-06-20 (×10): 17 g
  Filled 2022-06-07 (×10): qty 1

## 2022-06-07 MED ORDER — FENTANYL 2500MCG IN NS 250ML (10MCG/ML) PREMIX INFUSION
0.0000 ug/h | INTRAVENOUS | Status: DC
  Administered 2022-06-07: 100 ug/h via INTRAVENOUS
  Filled 2022-06-07: qty 250

## 2022-06-07 NOTE — ED Notes (Signed)
Patient saturated with urine down to feet. Patient able to stand and move from stretcher and onto hospital bed with RT and three nurses present. Skin cleansed with barrier clothes and external female catheter applied. Patient denies additional needs at this time.

## 2022-06-07 NOTE — Progress Notes (Signed)
1930 patient on vent following commands on sedation lots of secretions. TF running as ordered 2200 all meds given as ordered family updated on phone after verifying password

## 2022-06-07 NOTE — Progress Notes (Signed)
After reviewing the chest x-ray, MD requested ETT to be pulled back 2 cm's. ETT pulled back to 21 at the teeth.

## 2022-06-07 NOTE — Progress Notes (Signed)
MD and RN at bedside with RT for emergent intubation. RT assisted MD with intubation, ETT size #7.5 and 23 at the lip. Ambu bag and suction at bedside. Successful intubation upon one attempt by MD, positive color change and bilateral breath sounds. Patient tolerating vent and settings at this time.  Vent Mode: AC FiO2 (%):  [32 %-60 %] 60 % Set Rate:  [24 bmp] 24 bmp Vt Set:  [480 mL] 480 mL PEEP:  [5 cmH20] 5 cmH20  Arterial Blood Gas result:  pO2 69; pCO2 89; pH 7.29;  HCO3 42.8, %O2 Sat 93.5 Results are while on BIPAP.[name]

## 2022-06-07 NOTE — Plan of Care (Signed)
  Problem: Education: Goal: Ability to describe self-care measures that may prevent or decrease complications (Diabetes Survival Skills Education) will improve Outcome: Not Progressing Goal: Individualized Educational Video(s) Outcome: Not Progressing   Problem: Coping: Goal: Ability to adjust to condition or change in health will improve Outcome: Not Progressing   Problem: Fluid Volume: Goal: Ability to maintain a balanced intake and output will improve Outcome: Not Progressing   Problem: Health Behavior/Discharge Planning: Goal: Ability to identify and utilize available resources and services will improve Outcome: Not Progressing Goal: Ability to manage health-related needs will improve Outcome: Not Progressing   Problem: Metabolic: Goal: Ability to maintain appropriate glucose levels will improve Outcome: Not Progressing   Problem: Nutritional: Goal: Maintenance of adequate nutrition will improve Outcome: Not Progressing Goal: Progress toward achieving an optimal weight will improve Outcome: Not Progressing   Problem: Skin Integrity: Goal: Risk for impaired skin integrity will decrease Outcome: Not Progressing   Problem: Tissue Perfusion: Goal: Adequacy of tissue perfusion will improve Outcome: Not Progressing   Problem: Education: Goal: Knowledge of General Education information will improve Description: Including pain rating scale, medication(s)/side effects and non-pharmacologic comfort measures Outcome: Not Progressing   Problem: Health Behavior/Discharge Planning: Goal: Ability to manage health-related needs will improve Outcome: Not Progressing   Problem: Clinical Measurements: Goal: Ability to maintain clinical measurements within normal limits will improve Outcome: Not Progressing Goal: Will remain free from infection Outcome: Not Progressing Goal: Diagnostic test results will improve Outcome: Not Progressing Goal: Respiratory complications will  improve Outcome: Not Progressing Goal: Cardiovascular complication will be avoided Outcome: Not Progressing   Problem: Activity: Goal: Risk for activity intolerance will decrease Outcome: Not Progressing   Problem: Nutrition: Goal: Adequate nutrition will be maintained Outcome: Not Progressing   Problem: Coping: Goal: Level of anxiety will decrease Outcome: Not Progressing   Problem: Elimination: Goal: Will not experience complications related to bowel motility Outcome: Not Progressing Goal: Will not experience complications related to urinary retention Outcome: Not Progressing   Problem: Pain Managment: Goal: General experience of comfort will improve Outcome: Not Progressing   Problem: Safety: Goal: Ability to remain free from injury will improve Outcome: Not Progressing   Problem: Skin Integrity: Goal: Risk for impaired skin integrity will decrease Outcome: Not Progressing  Patient total care  on vent 60% TF running as ordered

## 2022-06-07 NOTE — Progress Notes (Signed)
Critical care note:  Date of note: 06/07/2022.  Subjective: The patient continues to have further somnolence with decreased responsiveness and ABG revealing a pH of 7.29 compared to 7.28 and 7.27 earlier, pCO2 of 89 compared to 87 and 89 earlier, pO2 of 69 compared to 64 and 63 earlier, HCO3 of 42.8 compared to 40.9 earlier twice and O2 sat of 93.5% compared to 92.6 and 89.8 earlier.  Objective: Physical examination: Generally: Acutely ill morbidly obese African-American female in moderate respiratory distress on BiPAP Vital signs per history of present illness. Head - atraumatic, normocephalic.  Pupils - equal, round and reactive to light and accommodation. Extraocular movements are intact. No scleral icterus.  Oropharynx - moist mucous membranes and tongue. No pharyngeal erythema or exudate.  Neck - supple. No JVD. Carotid pulses 2+ bilaterally. No carotid bruits. No palpable thyromegaly or lymphadenopathy. Cardiovascular - regular rate and rhythm. Normal S1 and S2. No murmurs, gallops or rubs.  Lungs -diminished bibasal breath sounds with diffusely coarse breath sounds and occasional expiratory wheezes. Abdomen - soft and nontender. Positive bowel sounds. No palpable organomegaly or masses.  Extremities - no pitting edema, clubbing or cyanosis.  Neuro - grossly non-focal. Skin - no rashes. Breast, pelvic and rectal - deferred.  Labs and notes were reviewed.  Portable chest x-ray postintubation showed endotracheal tube tip that is 1.5 cm above the base of the carina and pulmonary vascular congestion without overt pulmonary edema.  Assessment/plan: Acute hypoxic and hypercarbic respiratory failure secondary to OSA/OHS, asthma/COPD exacerbation as well as possible new onset acute CHF.  The patient clearly failed BiPAP trial with worsening mental status with mental obtundation. - The patient received RSI with 100 mcg of IV fentanyl and was intubated by Dr. Tamala Julian in the emergency room. - She  will be placed on ventilator protocol. - Intensivist consult was obtained and the patient will be transferred to [facility] service. - Other plan of care as mentioned above.  Authorized and performed by: Eugenie Norrie, MD Total critical care time: Approximately   30    minutes. Due to a high probability of clinically significant, life-threatening deterioration, the patient required my highest level of preparedness to intervene emergently and I personally spent this critical care time directly and personally managing the patient.  This critical care time included obtaining a history, examining the patient, pulse oximetry, ordering and review of studies, arranging urgent treatment with development of management plan, evaluation of patient's response to treatment, frequent reassessment, and discussions with other providers. This critical care time was performed to assess and manage the high probability of imminent, life-threatening deterioration that could result in multiorgan failure.  It was exclusive of separately billable procedures and treating other patients and teaching time.

## 2022-06-07 NOTE — Progress Notes (Signed)
eLink Physician-Brief Progress Note Patient Name: Desiree Stone DOB: 10-23-1975 MRN: [id number]   Date of Service  06/07/2022  HPI/Events of Note  Patient admitted with acute on chronic respiratory failure requiring intubation and mechanical ventilation, secondary to a combination of COPD exacerbation and congestive heart failure.  eICU Interventions  New Patient Evaluation.        Frederik Pear 06/07/2022, 6:46 AM

## 2022-06-07 NOTE — Progress Notes (Signed)
*  PRELIMINARY RESULTS* Echocardiogram 2D Echocardiogram has been performed.  Desiree Stone 06/07/2022, 10:12 AM

## 2022-06-07 NOTE — Consult Note (Addendum)
PHARMACY CONSULT NOTE - ELECTROLYTES  Pharmacy Consult for Electrolyte Monitoring and Replacement   Recent Labs: Potassium (mmol/L)  Date Value  06/07/2022 4.9   Magnesium (mg/dL)  Date Value  06/07/2022 2.2   Calcium (mg/dL)  Date Value  06/07/2022 8.9   Albumin (g/dL)  Date Value  06/06/2022 3.4 (L)   Phosphorus (mg/dL)  Date Value  06/07/2022 4.8 (H)   Sodium (mmol/L)  Date Value  06/07/2022 137   Corrected Ca: 8.9 mg/dL  Assessment  Desiree Stone is a 47 y.o. female presenting with AHRF. 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 Pharmacy has been consulted to monitor and replace electrolytes.  Diet: NPO  Goal of Therapy: Electrolytes WNL  Plan:  Potassium: 3.9 >> 4.9, no replacement needed Magnesium: 2.2, no replacement needed Phosphorus: 4.8, elevated, continue to monitor Check BMP, Mg, Phos with AM labs  Thank you for allowing pharmacy to be a part of this patient's care.  Gretel Acre, PharmD PGY1 Pharmacy Resident 06/07/2022 7:30 AM

## 2022-06-07 NOTE — ED Notes (Signed)
RT collected ABG at this time

## 2022-06-07 NOTE — Progress Notes (Signed)
Initial Nutrition Assessment  DOCUMENTATION CODES:   Morbid obesity  INTERVENTION:   Vital HP '@20ml'$ /hr + ProSource TF 20- Give 32m QID via tube  Propofol: 40.8 ml/hr- provides 1077kcal/day   Free water flushes 358mq4 hours to maintain tube patency   Regimen provides 1557kcal/day, 122g/day protein and 58159may of free water.   Liquid MVI daily via tube   Pt at low refeed risk; recommend monitor potassium, magnesium and phosphorus labs daily until stable  Daily weights   NUTRITION DIAGNOSIS:   Inadequate oral intake related to inability to eat (pt sedated and ventilated) as evidenced by NPO status.  GOAL:   Provide needs based on ASPEN/SCCM guidelines  MONITOR:   Vent status, Labs, Weight trends, TF tolerance, I & O's, Skin  REASON FOR ASSESSMENT:   Ventilator    ASSESSMENT:   [age]o female with h/o OSA, HTN, DM, asthma, morbid obesity and uterine fibroid who is admitted with new COPD and CHF.  Pt sedated and ventilated. OGT in place. Will plan to initiate tube feeds today. Per chart, pt appears to be weight stable pta.   Medications reviewed and include: azithromycin, colace, lovenox, pepcid, lasix, insulin, solu-medrol, MVI, miralax, propofol   Labs reviewed: K 4.9 wnl, P 4.8(H), Mg 2.2 wnl Wbc- 14.9(H), MCV 71.5(L), MCH 18.2(L), MCHC 25.4(H) Cbgs- 213, 257 x 24 hrs  Patient is currently intubated on ventilator support MV: 9.17 L/min Temp (24hrs), Avg:98.3 F (36.8 C), Min:98.1 F (36.7 C), Max:98.6 F (37 C)  Propofol: 40.8 ml/hr- provides 1077kcal/day   MAP- >39m67m  UOP- 125ml63mTRITION - FOCUSED PHYSICAL EXAM:  Flowsheet Row Most Recent Value  Orbital Region No depletion  Upper Arm Region No depletion  Thoracic and Lumbar Region No depletion  Buccal Region No depletion  Temple Region No depletion  Clavicle Bone Region No depletion  Clavicle and Acromion Bone Region No depletion  Scapular Bone Region No depletion  Dorsal Hand No  depletion  Patellar Region No depletion  Anterior Thigh Region No depletion  Posterior Calf Region No depletion  Edema (RD Assessment) Mild  Hair Reviewed  Eyes Reviewed  Mouth Reviewed  Skin Reviewed  Nails Reviewed   Diet Order:   Diet Order             Diet NPO time specified  Diet effective now                  EDUCATION NEEDS:   No education needs have been identified at this time  Skin:  Skin Assessment: Reviewed RN Assessment  Last BM:  PTA  Height:   Ht Readings from Last 1 Encounters:  06/07/22 '4\' 11"'$  (1.499 m)    Weight:   Wt Readings from Last 1 Encounters:  06/06/22 136 kg    Ideal Body Weight:  44.5 kg  BMI:  Body mass index is 60.56 kg/m.  Estimated Nutritional Needs:   Kcal:  1000-1200kcal/day  Protein:  >110g/day  Fluid:  1.4-1.6L/day  CaseyKoleen DistanceRD, LDN Please refer to [facility]RD and/or RD on-call/weekend/after hours pager

## 2022-06-07 NOTE — ED Notes (Signed)
Attempted to call report to ICU. RN unable to take report at this time.

## 2022-06-07 NOTE — Progress Notes (Signed)
After ABG results, BIPAP modes were adjusted and changed to AVAPS with the Tidal Volume set to 480, RR 18, Min P 20 cm, Max P 35 cm, EPAP 5 cm, FiO2 36%. PT responding well, tolerating current settings at this time, with improvement in Tidal Volumes. MD notified. Will repeat ABG in an hour.

## 2022-06-07 NOTE — ED Provider Notes (Signed)
Throughout the past few hours during my night shift, patient has been notably declining.    Initially note oxygen saturations of about 85% with a good waveform on telemetry while patient is on BiPAP, not pulling very good volumes.  Only around 250-300 cc.  RT is called to the room and adjusted BiPAP settings to AVAPS, which does produce better volumes for the patient and reduction of respiratory rate.  She seems somewhat more awake on clinical assessments.  Throughout this process, repeat ABGs with persistent respiratory acidosis.   She is subsequently pulling off her BiPAP mask, clinically worsening again and becoming less awake.  ABGs remain acidotic.  I am concerned about respiratory failure.  I discussed with the covering hospitalist who agrees with intubation.  I also reach out to the nurse practitioner covering ICU tonight for admission.  They will place orders and they do have beds upstairs.  .Critical Care  Performed by: Vladimir Crofts, MD Authorized by: Vladimir Crofts, MD   Critical care provider statement:    Critical care time (minutes):  30   Critical care time was exclusive of:  Separately billable procedures and treating other patients   Critical care was necessary to treat or prevent imminent or life-threatening deterioration of the following conditions:  Respiratory failure   Critical care was time spent personally by me on the following activities:  Development of treatment plan with patient or surrogate, discussions with consultants, evaluation of patient's response to treatment, examination of patient, ordering and review of laboratory studies, ordering and review of radiographic studies, ordering and performing treatments and interventions, pulse oximetry, re-evaluation of patient's condition and review of old charts Procedure Name: Intubation Date/Time: 06/07/2022 5:15 AM  Performed by: Vladimir Crofts, MDPre-anesthesia Checklist: Patient identified, Patient being monitored, Emergency  Drugs available, Timeout performed and Suction available Oxygen Delivery Method: Non-rebreather mask Preoxygenation: Pre-oxygenation with 100% oxygen Induction Type: Rapid sequence Ventilation: Mask ventilation without difficulty Laryngoscope Size: Glidescope and 4 Tube size: 7.5 mm Number of attempts: 1 Airway Equipment and Method: Rigid stylet Placement Confirmation: ETT inserted through vocal cords under direct vision, CO2 detector and Breath sounds checked- equal and bilateral Secured at: 23 cm Tube secured with: ETT holder        Vladimir Crofts, MD 06/07/22 [phone]

## 2022-06-07 NOTE — ED Notes (Addendum)
Patient pulled BiPap off. BiPap placed back on patient. RT called and notified of high inspiratory pressure alarm. RT states they will come to bedside. Patient appears more alert and is redirectable at this time.

## 2022-06-07 NOTE — Consult Note (Addendum)
NAME:  Desiree Stone, MRN:  [id number], DOB:  05/01/1976, LOS: 0 ADMISSION DATE:  06/06/2022, CONSULTATION DATE:  06/07/22 REFERRING MD:  Dr. Charlsie Quest, CHIEF COMPLAINT:  Shortness of Breath   History of Present Illness:  47 yo F presenting to [facility] ED from home with complaints of increasing shortness of breath. History provided per chart review, some items confirmed with her mother via phone interview. Patient reported on arrival to increased dyspnea over the past few months, worsening acutely in the last couple of days. She stated she could hardly walk down the hallway at the school and had to stop and rest. She denied outpatient oxygen use and has been unable to use her CPAP as she is unable to sleep in her bed. She instead has been sleeping in her chair for the past several weeks. She endorsed continued cigarette smoking. In addition she reported a productive cough with thick sputum and palpitations, as well as lower extremity edema. She denied difficulty swallowing, increased abdominal distention, or heart failure history. 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 Lab work significant for mild hyponatremia, hyperglycemia, leukocytosis, elevated but flat troponin and mildly elevated BNP. She was negative for COVID, RSV & influenza. Her respiratory status deteriorated and she was requiring 4 L [place]. CTa imaging was negative for PE, but did reveal cardiomegaly and an enlarged pulmonary artery suggestive of pulmonary hypertension. TRH was consulted for admission. The patient's respiratory status did not improve while waiting for bed placement in the ED, with ABG demonstrating respiratory acidosis. The patient was placed on BIPAP. Follow up ABG's showed persistent respiratory acidosis even with setting adjustments. Eventually the decision  was made to urgently intubate the patient and place her on mechanical ventilatory support. Medications given: lasix 40 mg, IV contrast, fentanyl, rocuronium & etomidate, lovenox, duoneb, solu medrol Initial Vitals: 98.4, 24, 98, 136/99 & 90% on 2 L [place] Significant labs: (Labs/ Imaging personally reviewed) I, Domingo Pulse [place], AGACNP-BC, personally viewed and interpreted this ECG. EKG Interpretation: Date: 06/06/22, EKG Time: 12:29, Rate: 104, Rhythm: ST, QRS Axis:  LAD, Intervals: normal, ST/T Wave abnormalities: non specific T wave abnormalities, Narrative Interpretation: ST with LAD and non specific T wave abnormalities Chemistry: Na+: 134, K+: 3.9, BUN/Cr.: 6/ 0.52, Serum CO2/ AG: 31/10, Cl: 93 Hematology: WBC: 14.4 , Hgb: 12.0, Hct: 46.2  Troponin: 22 > 21, BNP: 103.6, COVID-19 & Influenza A/B: negative TSH: 0.772 VBG: 7.27/ 89/ 63/ 40.9 >> ABG: 7.28/ 87/ 64/ 40.9 >> 7.29/ 89/ 69/ 42.8  CXR 06/06/22: no active cardiopulmonary disease. No evidence of pneumonia or pulmonary edema. Bronchitis/reactive airway disease, likely chronic. Borderline cardiomegaly CT soft tissue neck w contrast 06/06/22: no acute finding in the neck CT angio chest PE 06/06/22: no large central PE. The distal pulmonary arteries are not well evaluated due to the patient body habitus. No other evidence of acute cardiopulmonary pathology. Cardiomegaly with enlarged pulmonary artery suggesting pulmonary hypertension and refluxed contrast into the IVC, suggesting right heart dysfunction. Left adrenal nodule has been present since 2012 consistent with a benign adenoma.  PCCM consulted for admission due to worsening acute respiratory failure suspect multifocal secondary to acute new onset heart failure & suspected COPD in the setting of suspected pulmonary hypertension requiring emergent intubation and mechanical ventilatory support.  Pertinent  Medical History  Asthma T2DM HTN OSA Tobacco Use (32 pack year  history) Obesity Significant Hospital Events: Including procedures, antibiotic start and stop dates in addition to other pertinent events   06/07/22: Admit to ICU with worsening acute respiratory failure suspect secondary to acute new onset heart failure int he setting of suspected pulmonary hypertension & chronic COPD, OSA, asthma requiring emergent intubation and mechanical ventilatory support.  Interim History / Subjective:  Patient intubated and sedated, VSS  Objective   Blood pressure 116/74, pulse (!) 105, temperature 98.4 F (36.9 C), temperature source Axillary, resp. rate (!) 23, height '4\' 11"'$  (1.499 m), weight 136 kg, last menstrual period 05/30/2022, SpO2 96 %.    FiO2 (%):  [32 %-40 %] 36 %   Intake/Output Summary (Last 24 hours) at 06/07/2022 0418 Last data filed at 06/06/2022 2101 Gross per 24 hour  Intake 5 ml  Output --  Net 5 ml   Filed Weights   06/06/22 1233 06/06/22 1539  Weight: 136.1 kg 136 kg    Examination: General: Adult female, critically ill, lying in bed intubated & sedated requiring mechanical ventilation, NAD HEENT: MM pink/moist, anicteric, atraumatic, neck supple Neuro: sedated, unable to follow commands, PERRL +3, MAE CV: s1s2 RRR, NSR on monitor, no r/m/g Pulm: Regular, non labored on AC 60% & PEEP 5, breath sounds coarse with expiratory wheezing-BUL & diminished-BLL GI: soft, rounded, bs x 4 GU: foley in place with clear yellow urine Skin: limited exam no rashes/lesions noted Extremities: warm/dry, pulses + 2 R/P, no edema noted  Resolved Hospital Problem list     Assessment & Plan:  Acute Hypoxic / Hypercapnic Respiratory Failure multifocal suspect secondary to chronic COPD & suspected new onset CHF PMHx: Asthma, OSA, tobacco use - Ventilator settings: PRVC  8 mL/kg, 60% FiO2, 5 PEEP, continue ventilator support & lung protective strategies - Wean PEEP & FiO2 as tolerated, maintain SpO2 > 90% - Head of bed elevated 30 degrees, VAP  protocol in place - Plateau pressures less than 30 cm H20  - Intermittent chest x-ray & ABG PRN - Daily WUA with SBT as tolerated  - Ensure adequate pulmonary hygiene  - trend PCT - solu medrol 40 mg daily - Budesonide nebs BID, Duo nebs Q 6, bronchodilators PRN - PAD protocol in place: continue Fentanyl IVP & Propofol drip  Suspected New onset CHF Elevated Troponin secondary to demand ischemia Query pulmonary hypertension PMHx: HTN, HLD Patient reports orthopnea, dyspnea on exertion & BLE edema. CTa suggestive of Right heart dysfunction and pulmonary HTN - Echocardiogram ordered - f/u lipid panel, continue outpatient Lipitor - Continuous cardiac monitoring  - Strict I/O's: alert provider if UOP < 0.5 mL/kg/hr - Daily BMP, replace electrolytes PRN - Daily weights to assess volume status - enalapril on hold due to IV sedatives, consider restarting as the patient stabilizes - Diurese with the use of IV lasix  as renal function and hemodynamics allow - Consider Cardiology consult, depending on Echo results  Leukocytosis Suspect reactive - f/u PCT - daily CBC, monitor WBC/fever curve - hold off on empiric coverage at this time  Type 2 Diabetes Mellitus Hemoglobin A1C: pending - Monitor CBG Q 4 hours - SSI resistant dosing - target range while in ICU: 140-180 - follow ICU hyper/hypo-glycemia protocol  Best Practice (right click and "Reselect all SmartList Selections" daily)  Diet/type: NPO w/ meds via tube DVT prophylaxis: LMWH GI prophylaxis: H2B Lines: N/A Foley:  Yes, and it is still needed Code Status:  full code Last  date of multidisciplinary goals of care discussion [06/06/22- confirmed by [facility] service at time of admission]  Labs   CBC: Recent Labs  Lab 06/06/22 1238  WBC 14.4*  HGB 12.0  HCT 46.2*  MCV 69.3*  PLT 237    Basic Metabolic Panel: Recent Labs  Lab 06/06/22 1238  NA 134*  K 3.9  CL 93*  CO2 31  GLUCOSE 189*  BUN 6  CREATININE 0.52   CALCIUM 8.6*   GFR: Estimated Creatinine Clearance: 109 mL/min (by C-G formula based on SCr of 0.52 mg/dL). Recent Labs  Lab 06/06/22 1238  WBC 14.4*    Liver Function Tests: Recent Labs  Lab 06/06/22 1238  AST 16  ALT 15  ALKPHOS 106  BILITOT 0.5  PROT 7.5  ALBUMIN 3.4*   No results for input(s): "LIPASE", "AMYLASE" in the last 168 hours. No results for input(s): "AMMONIA" in the last 168 hours.  ABG    Component Value Date/Time   PHART 7.29 (L) 06/07/2022 0328   PCO2ART 89 (HH) 06/07/2022 0328   PO2ART 69 (L) 06/07/2022 0328   HCO3 42.8 (H) 06/07/2022 0328   O2SAT 93.5 06/07/2022 0328     Coagulation Profile: No results for input(s): "INR", "PROTIME" in the last 168 hours.  Cardiac Enzymes: No results for input(s): "CKTOTAL", "CKMB", "CKMBINDEX", "TROPONINI" in the last 168 hours.  HbA1C: No results found for: "HGBA1C"  CBG: Recent Labs  Lab 06/06/22 2225  GLUCAP 241*    Review of Systems:   UTA- patient intubated and sedated unable to participate in interview at this time  Past Medical History:  She,  has a past medical history of Asthma, Diabetes mellitus without complication ([place]), and Hypertension.   Surgical History:   Past Surgical History:  Procedure Laterality Date   carpel tunnel     COLONOSCOPY WITH PROPOFOL N/A 12/08/2014   Procedure: COLONOSCOPY WITH PROPOFOL;  Surgeon: Lollie Sails, MD;  [facility]COPY;  Service: Endoscopy;  Laterality: N/A;   ESOPHAGOGASTRODUODENOSCOPY       Social History:   reports that she quit smoking about 5 years ago. Her smoking use included cigarettes. She has never used smokeless tobacco. She reports that she does not drink alcohol and does not use drugs.   Family History:  Her family history includes Breast cancer (age of onset: 59) in her paternal aunt; Cancer in her paternal grandmother.   Allergies Allergies  Allergen Reactions   Abilify [Aripiprazole] Other (See Comments)     Syncope   Effexor [Venlafaxine] Other (See Comments)    Hair Loss   Wellbutrin [Bupropion] Other (See Comments)    Acne      Home Medications  Prior to Admission medications   Medication Sig Start Date End Date Taking? Authorizing Provider  albuterol (PROVENTIL HFA;VENTOLIN HFA) 108 (90 BASE) MCG/ACT inhaler Inhale into the lungs every 6 (six) hours as needed for wheezing or shortness of breath.    [provider]  amoxicillin (AMOXIL) 875 MG tablet Take 1 tablet (875 mg total) by mouth 2 (two) times daily. Patient not taking: Reported on 05/13/2020 07/10/18   Sherrie George B, FNP  atorvastatin (LIPITOR) 40 MG tablet Take 40 mg by mouth at bedtime. 05/06/20   [provider]  brompheniramine-pseudoephedrine-DM 30-2-10 MG/5ML syrup Take 5 mLs by mouth 4 (four) times daily as needed. Patient not taking: Reported on 05/13/2020 01/14/18   Sable Feil, PA-C  diphenhydrAMINE (BENADRYL) 25 mg capsule Take 1 capsule (25 mg total) by  mouth every 4 (four) hours as needed. Patient not taking: Reported on 05/13/2020 10/20/19 10/19/20  Laban Emperor, PA-C  enalapril (VASOTEC) 2.5 MG tablet Take 2.5 mg by mouth daily.    [provider]  fluticasone (FLOVENT DISKUS) 50 MCG/BLIST diskus inhaler Inhale 1 puff into the lungs 2 (two) times daily.    [provider]  guaiFENesin-codeine (ROBITUSSIN AC) 100-10 MG/5ML syrup Take 5 mLs by mouth 3 (three) times daily as needed for cough. Patient not taking: Reported on 06/06/2022 07/10/18   Sherrie George B, FNP  lidocaine (XYLOCAINE) 2 % solution Use as directed 5 mLs in the mouth or throat every 6 (six) hours as needed for mouth pain. Patient not taking: Reported on 05/13/2020 01/14/18   Sable Feil, PA-C  magic mouthwash w/lidocaine SOLN Take 5 mLs by mouth 4 (four) times daily. Patient not taking: Reported on 05/13/2020 09/10/17   Cuthriell, Charline Bills, PA-C  medroxyPROGESTERone (PROVERA) 10 MG tablet Take 1 tablet (10  mg total) by mouth daily. Patient not taking: Reported on 05/13/2020 07/04/17   Malachy Mood, MD  meloxicam (MOBIC) 7.5 MG tablet Take 7.5 mg by mouth daily. Patient not taking: Reported on 05/13/2020    [provider]  metFORMIN (GLUCOPHAGE) 1000 MG tablet Take 1,000 mg by mouth 2 (two) times daily. 03/09/20   [provider]  predniSONE (DELTASONE) 10 MG tablet Take 6 tablets on day 1, take 5 tablets on day 2, take 4 tablets on day 3, take 3 tablets on day 4, take 2 tablets on day 5, take 1 tablet on day 6 Patient not taking: Reported on 05/13/2020 10/20/19   Laban Emperor, PA-C  varenicline (CHANTIX) 1 MG tablet Take 1 mg by mouth 2 (two) times daily. Patient not taking: Reported on 05/13/2020    [provider]     Critical care time: 65 minutes       Venetia Night, AGACNP-BC Acute Care Nurse Practitioner [place]   [phone] / [phone] Please see Amion for pager details.

## 2022-06-07 NOTE — ED Notes (Signed)
Report given to Lamar, RN.

## 2022-06-07 NOTE — IPAL (Signed)
  Interdisciplinary Goals of Care Family Meeting   Date carried out: 06/07/2022  Location of the meeting: Bedside  Member's involved: Nurse Practitioner and Family Member or next of kin  Durable Power of Attorney or acting medical decision maker: Pt's daughter, pt's mother, pt's sister    Discussion: We discussed goals of care for [name] .  Discussed clinical course including respiratory failure due to AECOPD superimposed on OSA/OHS and suspected new onset CHF.  Failed trial of BiPAP requiring intubation and mechanical ventilation.  Discussed allowing time for outcomes and response to therapies as pt is day 1 on vent.  Did briefly discuss that pt may be hard to liberate from the vent given her history of COPD, OSA/OHS, and morbid obesity.  Code status:   Code Status: Full Code   Disposition: Continue current acute care  Time spent for the meeting: 10 minutes   Desiree Stone, AGACNP-BC [place] epic messenger for cross cover needs If after hours, please call E-link  Desiree Bienenstock, NP  06/07/2022, 1:08 PM

## 2022-06-08 ENCOUNTER — Inpatient Hospital Stay: Payer: Medicaid Other

## 2022-06-08 LAB — CBC
HCT: 42.2 % (ref 36.0–46.0)
Hemoglobin: 11.1 g/dL — ABNORMAL LOW (ref 12.0–15.0)
MCH: 18 pg — ABNORMAL LOW (ref 26.0–34.0)
MCHC: 26.3 g/dL — ABNORMAL LOW (ref 30.0–36.0)
MCV: 68.4 fL — ABNORMAL LOW (ref 80.0–100.0)
Platelets: 267 10*3/uL (ref 150–400)
RBC: 6.17 MIL/uL — ABNORMAL HIGH (ref 3.87–5.11)
RDW: 21.7 % — ABNORMAL HIGH (ref 11.5–15.5)
WBC: 14.2 10*3/uL — ABNORMAL HIGH (ref 4.0–10.5)
nRBC: 0.4 % — ABNORMAL HIGH (ref 0.0–0.2)

## 2022-06-08 LAB — IRON AND TIBC
Iron: 32 ug/dL (ref 28–170)
Saturation Ratios: 6 % — ABNORMAL LOW (ref 10.4–31.8)
TIBC: 518 ug/dL — ABNORMAL HIGH (ref 250–450)
UIBC: 486 ug/dL

## 2022-06-08 LAB — URINALYSIS, COMPLETE (UACMP) WITH MICROSCOPIC
Bilirubin Urine: NEGATIVE
Glucose, UA: NEGATIVE mg/dL
Ketones, ur: NEGATIVE mg/dL
Nitrite: NEGATIVE
Protein, ur: NEGATIVE mg/dL
Specific Gravity, Urine: 1.005 (ref 1.005–1.030)
pH: 7 (ref 5.0–8.0)

## 2022-06-08 LAB — ECHOCARDIOGRAM COMPLETE
AR max vel: 2.21 cm2
AV Area VTI: 2.1 cm2
AV Area mean vel: 2.16 cm2
AV Mean grad: 5 mmHg
AV Peak grad: 9.2 mmHg
Ao pk vel: 1.52 m/s
Area-P 1/2: 2.75 cm2
Height: 59 in
MV VTI: 2.54 cm2
S' Lateral: 2.9 cm
Weight: 4797.21 [oz_av]

## 2022-06-08 LAB — RENAL FUNCTION PANEL
Albumin: 3.1 g/dL — ABNORMAL LOW (ref 3.5–5.0)
Anion gap: 11 (ref 5–15)
BUN: 16 mg/dL (ref 6–20)
CO2: 33 mmol/L — ABNORMAL HIGH (ref 22–32)
Calcium: 8.8 mg/dL — ABNORMAL LOW (ref 8.9–10.3)
Chloride: 93 mmol/L — ABNORMAL LOW (ref 98–111)
Creatinine, Ser: 0.55 mg/dL (ref 0.44–1.00)
GFR, Estimated: >60 mL/min (ref >60)
Glucose, Bld: 143 mg/dL — ABNORMAL HIGH (ref 70–99)
Phosphorus: 5.1 mg/dL — ABNORMAL HIGH (ref 2.5–4.6)
Potassium: 3.6 mmol/L (ref 3.5–5.1)
Sodium: 137 mmol/L (ref 135–145)

## 2022-06-08 LAB — GLUCOSE, CAPILLARY
Glucose-Capillary: 137 mg/dL — ABNORMAL HIGH (ref 70–99)
Glucose-Capillary: 168 mg/dL — ABNORMAL HIGH (ref 70–99)
Glucose-Capillary: 208 mg/dL — ABNORMAL HIGH (ref 70–99)
Glucose-Capillary: 212 mg/dL — ABNORMAL HIGH (ref 70–99)
Glucose-Capillary: 150 mg/dL — ABNORMAL HIGH (ref 70–99)
Glucose-Capillary: 132 mg/dL — ABNORMAL HIGH (ref 70–99)

## 2022-06-08 LAB — TRIGLYCERIDES: Triglycerides: 166 mg/dL — ABNORMAL HIGH (ref <150)

## 2022-06-08 LAB — PROCALCITONIN: Procalcitonin: <0.1 ng/mL

## 2022-06-08 LAB — MAGNESIUM: Magnesium: 2.4 mg/dL (ref 1.7–2.4)

## 2022-06-08 LAB — C-REACTIVE PROTEIN: CRP: 2.7 mg/dL — ABNORMAL HIGH (ref <1.0)

## 2022-06-08 MED ORDER — PREDNISONE 20 MG PO TABS: 30.0000 mg | ORAL_TABLET | Freq: Once | ORAL | Status: DC

## 2022-06-08 MED ORDER — PREDNISONE 20 MG PO TABS: 40.0000 mg | ORAL_TABLET | Freq: Once | ORAL | Status: DC

## 2022-06-08 MED ORDER — FUROSEMIDE 10 MG/ML IJ SOLN
40.0000 mg | Freq: Two times a day (BID) | INTRAMUSCULAR | Status: DC
  Administered 2022-06-08 – 2022-06-17 (×18): 40 mg via INTRAVENOUS
  Filled 2022-06-08 (×18): qty 4

## 2022-06-08 MED ORDER — PREDNISONE 20 MG PO TABS
50.0000 mg | ORAL_TABLET | Freq: Once | ORAL | Status: AC
  Administered 2022-06-09: 50 mg
  Filled 2022-06-08: qty 1

## 2022-06-08 MED ORDER — PREDNISONE 20 MG PO TABS
45.0000 mg | ORAL_TABLET | Freq: Once | ORAL | Status: AC
  Administered 2022-06-10: 45 mg
  Filled 2022-06-08: qty 1

## 2022-06-08 MED ORDER — PREDNISONE 20 MG PO TABS: 35.0000 mg | ORAL_TABLET | Freq: Once | ORAL | Status: DC

## 2022-06-08 MED ORDER — SPIRONOLACTONE 25 MG PO TABS
50.0000 mg | ORAL_TABLET | Freq: Once | ORAL | Status: AC
  Administered 2022-06-08: 50 mg
  Filled 2022-06-08: qty 2

## 2022-06-08 MED ORDER — PREDNISONE 20 MG PO TABS: 25.0000 mg | ORAL_TABLET | Freq: Once | ORAL | Status: DC

## 2022-06-08 MED ORDER — HEPARIN SODIUM (PORCINE) 5000 UNIT/ML IJ SOLN
5000.0000 [IU] | Freq: Two times a day (BID) | INTRAMUSCULAR | Status: DC
  Administered 2022-06-08 – 2022-06-10 (×4): 5000 [IU] via SUBCUTANEOUS
  Filled 2022-06-08 (×4): qty 1

## 2022-06-08 MED ORDER — PREDNISONE 20 MG PO TABS: 20.0000 mg | ORAL_TABLET | Freq: Once | ORAL | Status: DC

## 2022-06-08 MED ORDER — SPIRONOLACTONE 25 MG PO TABS: 50.0000 mg | ORAL_TABLET | Freq: Every day | ORAL | Status: DC

## 2022-06-08 MED ORDER — GLYCOPYRROLATE 0.2 MG/ML IJ SOLN
0.1000 mg | Freq: Once | INTRAMUSCULAR | Status: AC
  Administered 2022-06-08: 0.1 mg via INTRAVENOUS
  Filled 2022-06-08: qty 1

## 2022-06-08 MED ORDER — DEXMEDETOMIDINE HCL IN NACL 400 MCG/100ML IV SOLN
0.0000 ug/kg/h | INTRAVENOUS | Status: DC
  Administered 2022-06-08: 0.4 ug/kg/h via INTRAVENOUS
  Administered 2022-06-09: 0.6 ug/kg/h via INTRAVENOUS
  Administered 2022-06-09: 1.2 ug/kg/h via INTRAVENOUS
  Administered 2022-06-10: 0.2 ug/kg/h via INTRAVENOUS
  Administered 2022-06-10 – 2022-06-11 (×2): 0.4 ug/kg/h via INTRAVENOUS
  Filled 2022-06-08 (×7): qty 100

## 2022-06-08 MED ORDER — FUROSEMIDE 10 MG/ML IJ SOLN
20.0000 mg | Freq: Once | INTRAMUSCULAR | Status: AC
  Administered 2022-06-08: 20 mg via INTRAVENOUS
  Filled 2022-06-08: qty 2

## 2022-06-08 NOTE — TOC Initial Note (Signed)
Transition of Care [facility]) - Initial/Assessment Note    Patient Details  Name: Desiree Stone MRN: [id number] Date of Birth: September 09, 1975  [facility] CM/SW Contact:    Shelbie Hutching, RN Phone Number: 06/08/2022, 10:14 AM  Clinical Narrative:                 Patient admitted to the hospital with acute respiratory failure requiring intubation.  Currently in the ICU intubated and sedated.   TOC will follow.   Expected Discharge Plan:  (TBD) Barriers to Discharge: Continued Medical Work up   Patient Goals and CMS Choice Patient states their goals for this hospitalization and ongoing recovery are:: patient unable to state- intubated and sedated          Expected Discharge Plan and Services   Discharge Planning Services: CM Consult   Living arrangements for the past 2 months: Single Family Home                                      Prior Living Arrangements/Services Living arrangements for the past 2 months: Single Family Home Lives with:: Self Patient language and need for interpreter reviewed:: Yes        Need for Family Participation in Patient Care: Yes (Comment) Care giver support system in place?: Yes (comment)   Criminal Activity/Legal Involvement Pertinent to Current Situation/Hospitalization: No - Comment as needed  Activities of Daily Living      Permission Sought/Granted                  Emotional Assessment Appearance:: Appears stated age Attitude/Demeanor/Rapport: Intubated (Following Commands or Not Following Commands) Affect (typically observed): Unable to Assess   Alcohol / Substance Use: Not Applicable Psych Involvement: No (comment)  Admission diagnosis:  Acute respiratory failure (HCC) [J96.00] Acute respiratory distress [R06.03] Hypoxia [R09.02] Acute respiratory failure with hypoxia and hypercapnia (HCC) [J96.01, J96.02] Acute hypoxic respiratory failure ([place]) [J96.01] Patient Active Problem List   Diagnosis  Date Noted   Acute respiratory failure ([place]) 06/07/2022   Acute respiratory failure with hypoxia and hypercapnia ([place]) 06/06/2022   Type 2 diabetes mellitus ([place]) 06/06/2022   Asthma 06/06/2022   Essential hypertension 03/30/2020   Obstructive sleep apnea 06/14/2016   Smoking 06/14/2016   Class 3 obesity in adult 04/12/2016   Insomnia disorder 04/12/2016   Family history of polyps in the colon 11/12/2014   PCP:  Inc, [place]:   [place], [place] [address] [place] [address] Phone: [phone] Fax: [place] [address]), [place] - [place] ([place]) [place] [address] Phone: [phone] Fax: [facility] #[id number] [place], [place] - [place] [address] [place] [address] Phone: [phone] Fax: [phone]     Social Determinants of Health (SDOH) Social History: SDOH Screenings   Physical Activity: Sufficiently Active (05/23/2017)  Social Connections: Moderately Isolated (05/23/2017)  Stress: No Stress Concern Present (05/23/2017)  Tobacco Use: Medium Risk (06/06/2022)   SDOH Interventions:     Readmission Risk Interventions     No data to display

## 2022-06-08 NOTE — Plan of Care (Signed)

## 2022-06-08 NOTE — Progress Notes (Addendum)
1930 patient on vent responds to voice  follows all commands excess oral secretions noted   06/09/2022   0630 patient very restless throughout the night needing constant PRN medication sedation max increased. Patient had excess saliva and nose secretions needing suction every 15-30 minutes. Potassium was replaced. Unable to get weight bed scale reading over 100kg from admission weight no lift in room to move patient.

## 2022-06-08 NOTE — Progress Notes (Signed)
Updated pt's mother at bedside. All questions answered.      Darel Hong, AGACNP-BC [facility] Prefer epic messenger for cross cover needs If after hours, please call E-link

## 2022-06-08 NOTE — Progress Notes (Signed)
NAME:  Desiree Stone, MRN:  [id number], DOB:  Dec 02, 1975, LOS: 1 ADMISSION DATE:  06/06/2022, CONSULTATION DATE:  06/07/22 REFERRING MD:  Dr. Charlsie Quest, CHIEF COMPLAINT:  Shortness of Breath   History of Present Illness:  47 yo F presenting to [facility] ED from home with complaints of increasing shortness of breath. History provided per chart review, some items confirmed with her mother via phone interview. Patient reported on arrival to increased dyspnea over the past few months, worsening acutely in the last couple of days. She stated she could hardly walk down the hallway at the school and had to stop and rest. She denied outpatient oxygen use and has been unable to use her CPAP as she is unable to sleep in her bed. She instead has been sleeping in her chair for the past several weeks. She endorsed continued cigarette smoking. In addition she reported a productive cough with thick sputum and palpitations, as well as lower extremity edema. She denied difficulty swallowing, increased abdominal distention, or heart failure history. 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 Lab work significant for mild hyponatremia, hyperglycemia, leukocytosis, elevated but flat troponin and mildly elevated BNP. She was negative for COVID, RSV & influenza. Her respiratory status deteriorated and she was requiring 4 L [place]. CTa imaging was negative for PE, but did reveal cardiomegaly and an enlarged pulmonary artery suggestive of pulmonary hypertension. TRH was consulted for admission. The patient's respiratory status did not improve while waiting for bed placement in the ED, with ABG demonstrating respiratory acidosis. The patient was placed on BIPAP. Follow up ABG's showed persistent respiratory acidosis even with setting adjustments. Eventually the decision  was made to urgently intubate the patient and place her on mechanical ventilatory support. Medications given: lasix 40 mg, IV contrast, fentanyl, rocuronium & etomidate, lovenox, duoneb, solu medrol Initial Vitals: 98.4, 24, 98, 136/99 & 90% on 2 L [place] Significant labs: (Labs/ Imaging personally reviewed) I, Domingo Pulse [place], AGACNP-BC, personally viewed and interpreted this ECG. EKG Interpretation: Date: 06/06/22, EKG Time: 12:29, Rate: 104, Rhythm: ST, QRS Axis:  LAD, Intervals: normal, ST/T Wave abnormalities: non specific T wave abnormalities, Narrative Interpretation: ST with LAD and non specific T wave abnormalities Chemistry: Na+: 134, K+: 3.9, BUN/Cr.: 6/ 0.52, Serum CO2/ AG: 31/10, Cl: 93  CXR 06/06/22: no active cardiopulmonary disease. No evidence of pneumonia or pulmonary edema. Bronchitis/reactive airway disease, likely chronic. Borderline cardiomegaly CT soft tissue neck w contrast 06/06/22: no acute finding in the neck CT angio chest PE 06/06/22: no large central PE. The distal pulmonary arteries are not well evaluated due to the patient body habitus. No other evidence of acute cardiopulmonary pathology. Cardiomegaly with enlarged pulmonary artery suggesting pulmonary hypertension and refluxed contrast into the IVC, suggesting right heart dysfunction. Left adrenal nodule has been present since 2012 consistent with a benign adenoma.  PCCM consulted for admission due to worsening acute respiratory failure suspect multifocal secondary to acute new onset heart failure & suspected COPD in the setting of suspected pulmonary hypertension requiring emergent intubation and mechanical ventilatory support.  06/08/22- patient is s/p SBT with severe secretions,   she did diurese >800cc today with lasix challenge, CXR with progressive pleural effusions.  She has family coming in today.  She's uisng OG and weve tranistioned to prednisone taper from solumedrol.  Adding aldactone per tube. Iron workup  today.   Pertinent  Medical History  Asthma T2DM HTN OSA Tobacco Use (32 pack year history) Obesity Significant Hospital Events: Including procedures, antibiotic start and stop dates in addition to other pertinent events   06/07/22: Admit to ICU with worsening acute respiratory failure suspect secondary to acute new onset heart failure int he setting of suspected pulmonary hypertension & chronic COPD, OSA, asthma requiring emergent intubation and mechanical ventilatory support.  Interim History / Subjective:  Patient intubated and sedated, VSS  Objective   Blood pressure 121/73, pulse 86, temperature 98.4 F (36.9 C), temperature source Axillary, resp. rate (!) 24, height '4\' 11"'$  (1.499 m), weight 85.6 kg, last menstrual period 05/30/2022, SpO2 96 %.    Vent Mode: PRVC FiO2 (%):  [45 %-60 %] 45 % Set Rate:  [24 bmp] 24 bmp Vt Set:  [380 mL] 380 mL PEEP:  [8 cmH20] 8 cmH20 Plateau Pressure:  [24 cmH20-26 cmH20] 26 cmH20   Intake/Output Summary (Last 24 hours) at 06/08/2022 1008 Last data filed at 06/08/2022 0957 Gross per 24 hour  Intake 1431.06 ml  Output 2495 ml  Net -1063.94 ml    Filed Weights   06/06/22 1233 06/06/22 1539 06/08/22 0500  Weight: 136.1 kg 136 kg 85.6 kg    Examination: General: Adult female, critically ill, lying in bed intubated & sedated requiring mechanical ventilation, NAD HEENT: MM pink/moist, anicteric, atraumatic, neck supple Neuro: sedated, unable to follow commands, PERRL +3, MAE CV: s1s2 RRR, NSR on monitor, no r/m/g Pulm: Regular, non labored on AC 60% & PEEP 5, breath sounds coarse with expiratory wheezing-BUL & diminished-BLL GI: soft, rounded, bs x 4 GU: foley in place with clear yellow urine Skin: limited exam no rashes/lesions noted Extremities: warm/dry, pulses + 2 R/P, no edema noted  Resolved Hospital Problem list     Assessment & Plan:  Acute Hypoxic / Hypercapnic Respiratory Failure multifocal suspect secondary to chronic COPD  & suspected new onset CHF PMHx: Asthma, OSA, tobacco use - Ventilator settings: PRVC  8 mL/kg, 60% FiO2, 5 PEEP, continue ventilator support & lung protective strategies - Wean PEEP & FiO2 as tolerated, maintain SpO2 > 90% - Head of bed elevated 30 degrees, VAP protocol in place - Plateau pressures less than 30 cm H20  - Intermittent chest x-ray & ABG PRN - Daily WUA with SBT as tolerated  - Ensure adequate pulmonary hygiene  - trend PCT - solu medrol 40 mg daily - Budesonide nebs BID, Duo nebs Q 6, bronchodilators PRN - PAD protocol in place: continue Fentanyl IVP & Propofol drip  Suspected New onset CHF Elevated Troponin secondary to demand ischemia Query pulmonary hypertension PMHx: HTN, HLD Patient reports orthopnea, dyspnea on exertion & BLE edema. CTa suggestive of Right heart dysfunction and pulmonary HTN - Echocardiogram ordered - f/u lipid panel, continue outpatient Lipitor - Continuous cardiac monitoring  - Strict I/O's: alert provider if UOP < 0.5 mL/kg/hr - Daily BMP, replace electrolytes PRN - Daily weights to assess volume status - enalapril on hold due to IV sedatives, consider restarting as the patient stabilizes - Diurese with the use of IV lasix  as renal function and hemodynamics allow - Consider Cardiology consult, depending on Echo results  Leukocytosis Suspect reactive - f/u PCT - daily CBC, monitor WBC/fever curve - hold off on empiric coverage at this time  Type 2 Diabetes Mellitus Hemoglobin A1C: pending - Monitor CBG Q 4 hours - SSI resistant dosing - target  range while in ICU: 140-180 - follow ICU hyper/hypo-glycemia protocol  Best Practice (right click and "Reselect all SmartList Selections" daily)  Diet/type: NPO w/ meds via tube DVT prophylaxis: LMWH GI prophylaxis: H2B Lines: N/A Foley:  Yes, and it is still needed Code Status:  full code Last date of multidisciplinary goals of care discussion [06/06/22- confirmed by [facility] service at time  of admission]  Labs   CBC: Recent Labs  Lab 06/06/22 1238 06/07/22 0454 06/08/22 0454  WBC 14.4* 14.9* 14.2*  HGB 12.0 12.0 11.1*  HCT 46.2* 47.2* 42.2  MCV 69.3* 71.5* 68.4*  PLT 312 297 267     Basic Metabolic Panel: Recent Labs  Lab 06/06/22 1238 06/07/22 0454 06/07/22 0500 06/08/22 0454  NA 134* 137  --  137  K 3.9 4.9  --  3.6  CL 93* 94*  --  93*  CO2 31 34*  --  33*  GLUCOSE 189* 228*  --  143*  BUN 6 9  --  16  CREATININE 0.52 0.47  --  0.55  CALCIUM 8.6* 8.9  --  8.8*  MG  --   --  2.2 2.4  PHOS  --   --  4.8* 5.1*    GFR: Estimated Creatinine Clearance: 81.7 mL/min (by C-G formula based on SCr of 0.55 mg/dL). Recent Labs  Lab 06/06/22 1238 06/07/22 0454 06/07/22 0500 06/08/22 0454  PROCALCITON  --   --  <0.10 <0.10  WBC 14.4* 14.9*  --  14.2*     Liver Function Tests: Recent Labs  Lab 06/06/22 1238 06/08/22 0454  AST 16  --   ALT 15  --   ALKPHOS 106  --   BILITOT 0.5  --   PROT 7.5  --   ALBUMIN 3.4* 3.1*    No results for input(s): "LIPASE", "AMYLASE" in the last 168 hours. No results for input(s): "AMMONIA" in the last 168 hours.  ABG    Component Value Date/Time   PHART 7.42 06/07/2022 0828   PCO2ART 60 (H) 06/07/2022 0828   PO2ART 68 (L) 06/07/2022 0828   HCO3 38.9 (H) 06/07/2022 0828   O2SAT 95.7 06/07/2022 0828     Coagulation Profile: No results for input(s): "INR", "PROTIME" in the last 168 hours.  Cardiac Enzymes: No results for input(s): "CKTOTAL", "CKMB", "CKMBINDEX", "TROPONINI" in the last 168 hours.  HbA1C: Hgb A1c MFr Bld  Date/Time Value Ref Range Status  06/07/2022 04:54 AM 9.0 (H) 4.8 - 5.6 % Final    Comment:    (NOTE) Pre diabetes:          5.7%-6.4%  Diabetes:              >6.4%  Glycemic control for   <7.0% adults with diabetes     CBG: Recent Labs  Lab 06/07/22 1524 06/07/22 1926 06/07/22 2316 06/08/22 0330 06/08/22 0729  GLUCAP 196* 220* 162* 137* 168*     Review of Systems:    UTA- patient intubated and sedated unable to participate in interview at this time  Past Medical History:  She,  has a past medical history of Asthma, Diabetes mellitus without complication ([place]), and Hypertension.   Surgical History:   Past Surgical History:  Procedure Laterality Date   carpel tunnel     COLONOSCOPY WITH PROPOFOL N/A 12/08/2014   Procedure: COLONOSCOPY WITH PROPOFOL;  Surgeon: Lollie Sails, MD;  Location[facility] ENDOSCOPY;  Service: Endoscopy;  Laterality: N/A;   ESOPHAGOGASTRODUODENOSCOPY  Social History:   reports that she quit smoking about 5 years ago. Her smoking use included cigarettes. She has never used smokeless tobacco. She reports that she does not drink alcohol and does not use drugs.   Family History:  Her family history includes Breast cancer (age of onset: 10) in her paternal aunt; Cancer in her paternal grandmother.   Allergies Allergies  Allergen Reactions   Abilify [Aripiprazole] Other (See Comments)    Syncope   Effexor [Venlafaxine] Other (See Comments)    Hair Loss   Wellbutrin [Bupropion] Other (See Comments)    Acne      Home Medications  Prior to Admission medications   Medication Sig Start Date End Date Taking? Authorizing Provider  albuterol (PROVENTIL HFA;VENTOLIN HFA) 108 (90 BASE) MCG/ACT inhaler Inhale into the lungs every 6 (six) hours as needed for wheezing or shortness of breath.    [provider]  amoxicillin (AMOXIL) 875 MG tablet Take 1 tablet (875 mg total) by mouth 2 (two) times daily. Patient not taking: Reported on 05/13/2020 07/10/18   Sherrie George B, FNP  atorvastatin (LIPITOR) 40 MG tablet Take 40 mg by mouth at bedtime. 05/06/20   [provider]  brompheniramine-pseudoephedrine-DM 30-2-10 MG/5ML syrup Take 5 mLs by mouth 4 (four) times daily as needed. Patient not taking: Reported on 05/13/2020 01/14/18   Sable Feil, PA-C  diphenhydrAMINE (BENADRYL) 25 mg capsule Take 1 capsule  (25 mg total) by mouth every 4 (four) hours as needed. Patient not taking: Reported on 05/13/2020 10/20/19 10/19/20  Laban Emperor, PA-C  enalapril (VASOTEC) 2.5 MG tablet Take 2.5 mg by mouth daily.    [provider]  fluticasone (FLOVENT DISKUS) 50 MCG/BLIST diskus inhaler Inhale 1 puff into the lungs 2 (two) times daily.    [provider]  guaiFENesin-codeine (ROBITUSSIN AC) 100-10 MG/5ML syrup Take 5 mLs by mouth 3 (three) times daily as needed for cough. Patient not taking: Reported on 06/06/2022 07/10/18   Sherrie George B, FNP  lidocaine (XYLOCAINE) 2 % solution Use as directed 5 mLs in the mouth or throat every 6 (six) hours as needed for mouth pain. Patient not taking: Reported on 05/13/2020 01/14/18   Sable Feil, PA-C  magic mouthwash w/lidocaine SOLN Take 5 mLs by mouth 4 (four) times daily. Patient not taking: Reported on 05/13/2020 09/10/17   Cuthriell, Charline Bills, PA-C  medroxyPROGESTERone (PROVERA) 10 MG tablet Take 1 tablet (10 mg total) by mouth daily. Patient not taking: Reported on 05/13/2020 07/04/17   Malachy Mood, MD  meloxicam (MOBIC) 7.5 MG tablet Take 7.5 mg by mouth daily. Patient not taking: Reported on 05/13/2020    [provider]  metFORMIN (GLUCOPHAGE) 1000 MG tablet Take 1,000 mg by mouth 2 (two) times daily. 03/09/20   [provider]  predniSONE (DELTASONE) 10 MG tablet Take 6 tablets on day 1, take 5 tablets on day 2, take 4 tablets on day 3, take 3 tablets on day 4, take 2 tablets on day 5, take 1 tablet on day 6 Patient not taking: Reported on 05/13/2020 10/20/19   Laban Emperor, PA-C  varenicline (CHANTIX) 1 MG tablet Take 1 mg by mouth 2 (two) times daily. Patient not taking: Reported on 05/13/2020    [provider]     Critical care provider statement:   Total critical care time: 33 minutes   Performed by: Lanney Gins MD   Critical care time was exclusive of separately billable procedures and treating  other patients.  Critical care was necessary to treat or prevent imminent or life-threatening deterioration.   Critical care was time spent personally by me on the following activities: development of treatment plan with patient and/or surrogate as well as nursing, discussions with consultants, evaluation of patient's response to treatment, examination of patient, obtaining history from patient or surrogate, ordering and performing treatments and interventions, ordering and review of laboratory studies, ordering and review of radiographic studies, pulse oximetry and re-evaluation of patient's condition.    Ottie Glazier, M.D.  Pulmonary & Critical Care Medicine

## 2022-06-08 NOTE — Consult Note (Signed)
PHARMACY CONSULT NOTE - ELECTROLYTES  Pharmacy Consult for Electrolyte Monitoring and Replacement   Recent Labs: Potassium (mmol/L)  Date Value  06/08/2022 3.6   Magnesium (mg/dL)  Date Value  06/08/2022 2.4   Calcium (mg/dL)  Date Value  06/08/2022 8.8 (L)   Albumin (g/dL)  Date Value  06/08/2022 3.1 (L)   Phosphorus (mg/dL)  Date Value  06/08/2022 5.1 (H)   Sodium (mmol/L)  Date Value  06/08/2022 137   Corrected Ca: 9.5 mg/dL  Assessment  Desiree Stone is a 47 y.o. female presenting with AHRF. 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 Pharmacy has been consulted to monitor and replace electrolytes.  Diet: NPO  Goal of Therapy: Electrolytes WNL  Plan:  Potassium: 4.9 >> 3.6, no replacement needed Magnesium: 2.2 >> 2.4, no replacement needed Phosphorus: 4.8 >> 5.1, elevated, continue to monitor Check BMP, Mg, Phos with AM labs  Thank you for allowing pharmacy to be a part of this patient's care.  Gretel Acre, PharmD PGY1 Pharmacy Resident 06/08/2022 7:16 AM

## 2022-06-09 DIAGNOSIS — J9602 Acute respiratory failure with hypercapnia: Secondary | ICD-10-CM

## 2022-06-09 DIAGNOSIS — J9601 Acute respiratory failure with hypoxia: Secondary | ICD-10-CM

## 2022-06-09 LAB — RENAL FUNCTION PANEL
Albumin: 3 g/dL — ABNORMAL LOW (ref 3.5–5.0)
Anion gap: 10 (ref 5–15)
BUN: 17 mg/dL (ref 6–20)
CO2: 34 mmol/L — ABNORMAL HIGH (ref 22–32)
Calcium: 8.7 mg/dL — ABNORMAL LOW (ref 8.9–10.3)
Chloride: 95 mmol/L — ABNORMAL LOW (ref 98–111)
Creatinine, Ser: 0.63 mg/dL (ref 0.44–1.00)
GFR, Estimated: >60 mL/min (ref >60)
Glucose, Bld: 121 mg/dL — ABNORMAL HIGH (ref 70–99)
Phosphorus: 5.3 mg/dL — ABNORMAL HIGH (ref 2.5–4.6)
Potassium: 3.4 mmol/L — ABNORMAL LOW (ref 3.5–5.1)
Sodium: 139 mmol/L (ref 135–145)

## 2022-06-09 LAB — CBC
HCT: 42.2 % (ref 36.0–46.0)
Hemoglobin: 11.3 g/dL — ABNORMAL LOW (ref 12.0–15.0)
MCH: 18.3 pg — ABNORMAL LOW (ref 26.0–34.0)
MCHC: 26.8 g/dL — ABNORMAL LOW (ref 30.0–36.0)
MCV: 68.2 fL — ABNORMAL LOW (ref 80.0–100.0)
Platelets: 273 10*3/uL (ref 150–400)
RBC: 6.19 MIL/uL — ABNORMAL HIGH (ref 3.87–5.11)
RDW: 21.7 % — ABNORMAL HIGH (ref 11.5–15.5)
WBC: 15.5 10*3/uL — ABNORMAL HIGH (ref 4.0–10.5)
nRBC: 0.2 % (ref 0.0–0.2)

## 2022-06-09 LAB — FERRITIN: Ferritin: 12 ng/mL (ref 11–307)

## 2022-06-09 LAB — GLUCOSE, CAPILLARY
Glucose-Capillary: 147 mg/dL — ABNORMAL HIGH (ref 70–99)
Glucose-Capillary: 146 mg/dL — ABNORMAL HIGH (ref 70–99)
Glucose-Capillary: 240 mg/dL — ABNORMAL HIGH (ref 70–99)
Glucose-Capillary: 215 mg/dL — ABNORMAL HIGH (ref 70–99)
Glucose-Capillary: 154 mg/dL — ABNORMAL HIGH (ref 70–99)

## 2022-06-09 LAB — PROCALCITONIN: Procalcitonin: <0.1 ng/mL

## 2022-06-09 LAB — MAGNESIUM: Magnesium: 2.3 mg/dL (ref 1.7–2.4)

## 2022-06-09 MED ORDER — FENTANYL CITRATE PF 50 MCG/ML IJ SOSY
50.0000 ug | PREFILLED_SYRINGE | Freq: Once | INTRAMUSCULAR | Status: AC
  Administered 2022-06-09: 50 ug via INTRAVENOUS

## 2022-06-09 MED ORDER — FENTANYL 2500MCG IN NS 250ML (10MCG/ML) PREMIX INFUSION
0.0000 ug/h | INTRAVENOUS | Status: DC
  Administered 2022-06-09: 50 ug/h via INTRAVENOUS
  Administered 2022-06-11 (×2): 150 ug/h via INTRAVENOUS
  Administered 2022-06-11 – 2022-06-12 (×2): 175 ug/h via INTRAVENOUS
  Administered 2022-06-13 – 2022-06-14 (×3): 150 ug/h via INTRAVENOUS
  Administered 2022-06-15 – 2022-06-19 (×9): 175 ug/h via INTRAVENOUS
  Administered 2022-06-20: 75 ug/h via INTRAVENOUS
  Administered 2022-06-21 – 2022-06-22 (×2): 100 ug/h via INTRAVENOUS
  Filled 2022-06-09 (×20): qty 250

## 2022-06-09 MED ORDER — FENTANYL CITRATE (PF) 100 MCG/2ML IJ SOLN
100.0000 ug | Freq: Once | INTRAMUSCULAR | Status: AC
  Administered 2022-06-09: 100 ug via INTRAVENOUS
  Filled 2022-06-09: qty 2

## 2022-06-09 MED ORDER — PROPOFOL 1000 MG/100ML IV EMUL
5.0000 ug/kg/min | INTRAVENOUS | Status: DC
  Administered 2022-06-09: 30 ug/kg/min via INTRAVENOUS
  Administered 2022-06-09: 40 ug/kg/min via INTRAVENOUS
  Administered 2022-06-09: 60 ug/kg/min via INTRAVENOUS
  Administered 2022-06-09 (×2): 80 ug/kg/min via INTRAVENOUS
  Administered 2022-06-09: 35 ug/kg/min via INTRAVENOUS
  Administered 2022-06-09 (×2): 80 ug/kg/min via INTRAVENOUS
  Administered 2022-06-10: 35 ug/kg/min via INTRAVENOUS
  Administered 2022-06-10 (×3): 60 ug/kg/min via INTRAVENOUS
  Administered 2022-06-10: 40 ug/kg/min via INTRAVENOUS
  Administered 2022-06-10: 50 ug/kg/min via INTRAVENOUS
  Administered 2022-06-11: 60 ug/kg/min via INTRAVENOUS
  Administered 2022-06-11: 45 ug/kg/min via INTRAVENOUS
  Administered 2022-06-11: 40 ug/kg/min via INTRAVENOUS
  Administered 2022-06-11 (×2): 50 ug/kg/min via INTRAVENOUS
  Administered 2022-06-11 (×2): 60 ug/kg/min via INTRAVENOUS
  Administered 2022-06-12: 55 ug/kg/min via INTRAVENOUS
  Administered 2022-06-12: 60 ug/kg/min via INTRAVENOUS
  Administered 2022-06-12: 45 ug/kg/min via INTRAVENOUS
  Administered 2022-06-12 (×2): 60 ug/kg/min via INTRAVENOUS
  Administered 2022-06-12: 45 ug/kg/min via INTRAVENOUS
  Administered 2022-06-12: 40 ug/kg/min via INTRAVENOUS
  Administered 2022-06-12: 30 ug/kg/min via INTRAVENOUS
  Administered 2022-06-12 – 2022-06-13 (×5): 40 ug/kg/min via INTRAVENOUS
  Administered 2022-06-13: 35 ug/kg/min via INTRAVENOUS
  Administered 2022-06-13: 30 ug/kg/min via INTRAVENOUS
  Administered 2022-06-13: 35 ug/kg/min via INTRAVENOUS
  Administered 2022-06-13 – 2022-06-14 (×3): 40 ug/kg/min via INTRAVENOUS
  Administered 2022-06-14: 35 ug/kg/min via INTRAVENOUS
  Administered 2022-06-14: 40 ug/kg/min via INTRAVENOUS
  Administered 2022-06-16 (×3): 35 ug/kg/min via INTRAVENOUS
  Administered 2022-06-16: 5 ug/kg/min via INTRAVENOUS
  Administered 2022-06-16 – 2022-06-17 (×6): 35 ug/kg/min via INTRAVENOUS
  Administered 2022-06-17: 34.926 ug/kg/min via INTRAVENOUS
  Administered 2022-06-17 – 2022-06-19 (×10): 35 ug/kg/min via INTRAVENOUS
  Administered 2022-06-19 (×2): 25 ug/kg/min via INTRAVENOUS
  Administered 2022-06-19: 35 ug/kg/min via INTRAVENOUS
  Administered 2022-06-20 (×2): 25 ug/kg/min via INTRAVENOUS
  Filled 2022-06-09: qty 200
  Filled 2022-06-09 (×30): qty 100
  Filled 2022-06-09: qty 200
  Filled 2022-06-09 (×30): qty 100
  Filled 2022-06-09: qty 200
  Filled 2022-06-09: qty 100
  Filled 2022-06-09: qty 200
  Filled 2022-06-09 (×12): qty 100

## 2022-06-09 MED ORDER — FENTANYL BOLUS VIA INFUSION
50.0000 ug | INTRAVENOUS | Status: DC | PRN
  Administered 2022-06-10: 100 ug via INTRAVENOUS
  Administered 2022-06-10 (×2): 75 ug via INTRAVENOUS
  Administered 2022-06-10 – 2022-06-13 (×4): 50 ug via INTRAVENOUS
  Administered 2022-06-14: 100 ug via INTRAVENOUS
  Administered 2022-06-15: 75 ug via INTRAVENOUS
  Administered 2022-06-15: 50 ug via INTRAVENOUS
  Administered 2022-06-15: 100 ug via INTRAVENOUS
  Administered 2022-06-21: 50 ug via INTRAVENOUS
  Administered 2022-06-21: 100 ug via INTRAVENOUS

## 2022-06-09 MED ORDER — FENTANYL 2500MCG IN NS 250ML (10MCG/ML) PREMIX INFUSION
INTRAVENOUS | Status: AC
  Filled 2022-06-09: qty 250

## 2022-06-09 MED ORDER — POTASSIUM CHLORIDE 20 MEQ PO PACK
40.0000 meq | PACK | Freq: Once | ORAL | Status: AC
  Administered 2022-06-09: 40 meq
  Filled 2022-06-09: qty 2

## 2022-06-09 MED ORDER — SODIUM CHLORIDE 0.9 % IV SOLN
2.0000 g | INTRAVENOUS | Status: DC
  Administered 2022-06-09 – 2022-06-10 (×2): 2 g via INTRAVENOUS
  Filled 2022-06-09: qty 2
  Filled 2022-06-09 (×2): qty 20

## 2022-06-09 NOTE — Consult Note (Addendum)
PHARMACY CONSULT NOTE - ELECTROLYTES  Pharmacy Consult for Electrolyte Monitoring and Replacement   Recent Labs: Potassium (mmol/L)  Date Value  06/09/2022 3.4 (L)   Magnesium (mg/dL)  Date Value  06/09/2022 2.3   Calcium (mg/dL)  Date Value  06/09/2022 8.7 (L)   Albumin (g/dL)  Date Value  06/09/2022 3.0 (L)   Phosphorus (mg/dL)  Date Value  06/09/2022 5.3 (H)   Sodium (mmol/L)  Date Value  06/09/2022 139   Corrected Ca: 9.5 mg/dL  Assessment  Desiree Stone is a 47 y.o. female presenting with AHRF. 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 Pharmacy has been consulted to monitor and replace electrolytes.  Diet: NPO Pertinent medications: lasix 40 mg IV BID  Goal of Therapy: Electrolytes WNL  Plan:  Potassium: 3.6 >> 3.4, give KCl 40 mEq per tube x1 Magnesium: 2.4 >> 2.3, no replacement needed Phosphorus: 5.1 >> 5.3, elevated, continue to monitor Check BMP, Mg, Phos with AM labs  Thank you for allowing pharmacy to be a part of this patient's care.  Gretel Acre, PharmD PGY1 Pharmacy Resident 06/09/2022 7:14 AM

## 2022-06-09 NOTE — Progress Notes (Signed)
NAME:  Desiree Stone, MRN:  [id number], DOB:  1976/02/04, LOS: 2 ADMISSION DATE:  06/06/2022, CONSULTATION DATE:  06/07/2022 REFERRING MD:  Dr. Tamala Julian, CHIEF COMPLAINT:  Shortness of breath   Brief Pt Description / Synopsis:  47 year old female admitted with acute hypoxic and hypercapnic respiratory failure in the setting of acute COPD exacerbation, new onset CHF, and suspected community-acquired pneumonia requiring intubation and mechanical ventilation.  History of Present Illness:  48 yo F presenting to [facility] ED from home with complaints of increasing shortness of breath. History provided per chart review, some items confirmed with her mother via phone interview. Patient reported on arrival to increased dyspnea over the past few months, worsening acutely in the last couple of days. She stated she could hardly walk down the hallway at the school and had to stop and rest. She denied outpatient oxygen use and has been unable to use her CPAP as she is unable to sleep in her bed. She instead has been sleeping in her chair for the past several weeks. She endorsed continued cigarette smoking. In addition she reported a productive cough with thick sputum and palpitations, as well as lower extremity edema. She denied difficulty swallowing, increased abdominal distention, or heart failure history. 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 Lab work significant for mild hyponatremia, hyperglycemia, leukocytosis, elevated but flat troponin and mildly elevated BNP. She was negative for COVID, RSV & influenza. Her respiratory status deteriorated and she was requiring 4 L [place]. CTa imaging was negative for PE, but did reveal cardiomegaly and an enlarged pulmonary artery suggestive of pulmonary hypertension. TRH was consulted for admission. The  patient's respiratory status did not improve while waiting for bed placement in the ED, with ABG demonstrating respiratory acidosis. The patient was placed on BIPAP. Follow up ABG's showed persistent respiratory acidosis even with setting adjustments. Eventually the decision was made to urgently intubate the patient and place her on mechanical ventilatory support. Medications given: lasix 40 mg, IV contrast, fentanyl, rocuronium & etomidate, lovenox, duoneb, solu medrol Initial Vitals: 98.4, 24, 98, 136/99 & 90% on 2 L [place] Significant labs: (Labs/ Imaging personally reviewed) I, Domingo Pulse [place], AGACNP-BC, personally viewed and interpreted this ECG. EKG Interpretation: Date: 06/06/22, EKG Time: 12:29, Rate: 104, Rhythm: ST, QRS Axis:  LAD, Intervals: normal, ST/T Wave abnormalities: non specific T wave abnormalities, Narrative Interpretation: ST with LAD and non specific T wave abnormalities Chemistry: Na+: 134, K+: 3.9, BUN/Cr.: 6/ 0.52, Serum CO2/ AG: 31/10, Cl: 93   CXR 06/06/22: no active cardiopulmonary disease. No evidence of pneumonia or pulmonary edema. Bronchitis/reactive airway disease, likely chronic. Borderline cardiomegaly CT soft tissue neck w contrast 06/06/22: no acute finding in the neck CT angio chest PE 06/06/22: no large central PE. The distal pulmonary arteries are not well evaluated due to the patient body habitus. No other evidence of acute cardiopulmonary pathology. Cardiomegaly with enlarged pulmonary artery suggesting pulmonary hypertension and refluxed contrast into the IVC, suggesting right heart dysfunction. Left adrenal nodule has been present since 2012 consistent with a benign adenoma.   PCCM consulted for admission due to worsening acute respiratory failure suspect multifocal secondary to acute new onset heart failure & suspected COPD in the setting of suspected pulmonary hypertension requiring emergent intubation and mechanical ventilatory support.  Please see  "significant  hospital events" section below for full detailed hospital course.  Pertinent  Medical History   Past Medical History:  Diagnosis Date   Asthma    Diabetes mellitus without complication ([place])    Hypertension     Micro Data:  1/16: SARS-CoV-2/influenza/RSV PCR>> negative 1/16: Group A strep PCR>> negative 1/17: MRSA PCR>> negative 1/17: HIV screen>> nonreactive 1/18: Blood culture x 2>> no growth to date 1/18: Tracheal aspirate>> gram-positive cocci  Antimicrobials:   Anti-infectives (From admission, onward)    Start     Dose/Rate Route Frequency Ordered Stop   06/09/22 1100  cefTRIAXone (ROCEPHIN) 2 g in sodium chloride 0.9 % 100 mL IVPB        2 g 200 mL/hr over 30 Minutes Intravenous Every 24 hours 06/09/22 1006 06/14/22 1059   06/07/22 1100  azithromycin (ZITHROMAX) tablet 250 mg        250 mg Per Tube Daily 06/07/22 1006 06/12/22 0959       Significant Hospital Events: Including procedures, antibiotic start and stop dates in addition to other pertinent events   06/07/22: Admit to ICU with worsening acute respiratory failure suspect secondary to acute new onset heart failure int he setting of suspected pulmonary hypertension & chronic COPD, OSA, asthma requiring emergent intubation and mechanical ventilatory support.  06/08/22- patient is s/p SBT with severe secretions, she did diurese >800cc today with lasix challenge, CXR with progressive pleural effusions. She has family coming in today. She's uisng OG and weve tranistioned to prednisone taper from solumedrol. Adding aldactone per tube. Iron workup today.  06/09/22-on minimal vent support, will attempt SBT.  Tracheal aspirate growing gram-positive cocci, ceftriaxone added  Interim History / Subjective:  -No significant events noted overnight -Afebrile, hemodynamically stable, no vasopressors -On minimal vent settings this morning, 35% FiO2 ~will perform SBT as tolerated -Tracheal aspirate growing  gram-positive cocci ~ceftriaxone started  Objective   Blood pressure 139/82, pulse 75, temperature (!) 100.5 F (38.1 C), temperature source Axillary, resp. rate (!) 24, height '4\' 11"'$  (1.499 m), weight 136 kg, last menstrual period 05/30/2022, SpO2 91 %.    Vent Mode: PRVC FiO2 (%):  [35 %-40 %] 35 % Set Rate:  [24 bmp] 24 bmp Vt Set:  [380 mL] 380 mL PEEP:  [8 cmH20] 8 cmH20 Plateau Pressure:  [24 cmH20] 24 cmH20   Intake/Output Summary (Last 24 hours) at 06/09/2022 1602 Last data filed at 06/09/2022 1306 Gross per 24 hour  Intake 1855.16 ml  Output 2885 ml  Net -1029.84 ml   Filed Weights   06/06/22 1233 06/06/22 1539  Weight: 136.1 kg 136 kg    Examination: General: Acute on chronically obese female, laying in bed, intubated and sedated, no acute distress HENT: Atraumatic, normocephalic, neck supple, difficult to assess JVD due to body habitus Lungs: Distant coarse breath sounds throughout, occasionally overbreathing the vent, even, nonlabored Cardiovascular: Regular rate and rhythm, S1-S2, no murmurs, rubs, gallops Abdomen: Soft, nontender, nondistended, no guarding or rebound tenderness, bowel sounds positive x 4 Extremities: No deformities, normal bulk and tone, trace edema bilateral lower extremities, warm with good peripheral perfusion Neuro: Lightly sedated, will arouse to gentle stimulation and follow commands, pupils PERRLA GU: Foley catheter in place draining yellow urine  Resolved Hospital Problem list     Assessment & Plan:   #Acute hypoxic and hypercapnic respiratory failure in the setting of acute COPD exacerbation, new onset CHF, and suspected community-acquired pneumonia PMHx: OSA on CPAP, asthma, tobacco use -Full vent support, implement lung protective  strategies -Plateau pressures less than 30 cm H20 -Wean FiO2 & PEEP as tolerated to maintain O2 sats 88 to 92% -Follow intermittent Chest X-ray & ABG as needed -Spontaneous Breathing Trials when  respiratory parameters met and mental status permits -Implement VAP Bundle -Bronchodilators -IV steroids -Antibiotics as above -Diuresis as renal function and blood pressure permits   #Suspected New onset CHF #Elevated Troponin secondary to demand ischemia #Query pulmonary hypertension PMHx: HTN, HLD Patient reports orthopnea, dyspnea on exertion & BLE edema. CTa suggestive of Right heart dysfunction and pulmonary HTN -Continuous cardiac monitoring -Maintain MAP >65 -Vasopressors as needed to maintain MAP goal ~ requiring -Trend HS Troponin until peaked -Echocardiogram 06/07/22: LVEF 60 to 21%, normal diastolic parameters, RV function normal -Diuresis as renal function and blood pressure permits  #Suspected community-acquired pneumonia -Monitor fever curve -Trend WBC's & Procalcitonin -Follow cultures as above -Continue empiric azithromycin and ceftriaxone pending cultures & sensitivities  #Diabetes Mellitus Type II -CBG's q4h; Target range of 140 to 180 -SSI -Follow ICU Hypo/Hyperglycemia protocol  #Sedation needs in the setting of Mechanical Ventilation -Maintain a RASS goal of 0 to -1 -Propofol as needed to maintain RASS goal -Avoid sedating medications as able -Daily wake up assessment     Best Practice (right click and "Reselect all SmartList Selections" daily)   Diet/type: tubefeeds DVT prophylaxis: prophylactic heparin  GI prophylaxis: H2B Lines: N/A Foley:  Yes, and it is still needed Code Status:  full code Last date of multidisciplinary goals of care discussion [06/09/2022]  1/19: Pt's mother updated at bedside.  Labs   CBC: Recent Labs  Lab 06/06/22 1238 06/07/22 0454 06/08/22 0454 06/09/22 0421  WBC 14.4* 14.9* 14.2* 15.5*  HGB 12.0 12.0 11.1* 11.3*  HCT 46.2* 47.2* 42.2 42.2  MCV 69.3* 71.5* 68.4* 68.2*  PLT 312 297 267 194    Basic Metabolic Panel: Recent Labs  Lab 06/06/22 1238 06/07/22 0454 06/07/22 0500 06/08/22 0454  06/09/22 0421 06/09/22 0424  NA 134* 137  --  137  --  139  K 3.9 4.9  --  3.6  --  3.4*  CL 93* 94*  --  93*  --  95*  CO2 31 34*  --  33*  --  34*  GLUCOSE 189* 228*  --  143*  --  121*  BUN 6 9  --  16  --  17  CREATININE 0.52 0.47  --  0.55  --  0.63  CALCIUM 8.6* 8.9  --  8.8*  --  8.7*  MG  --   --  2.2 2.4 2.3  --   PHOS  --   --  4.8* 5.1*  --  5.3*   GFR: Estimated Creatinine Clearance: 109 mL/min (by C-G formula based on SCr of 0.63 mg/dL). Recent Labs  Lab 06/06/22 1238 06/07/22 0454 06/07/22 0500 06/08/22 0454 06/09/22 0421  PROCALCITON  --   --  <0.10 <0.10 <0.10  WBC 14.4* 14.9*  --  14.2* 15.5*    Liver Function Tests: Recent Labs  Lab 06/06/22 1238 06/08/22 0454 06/09/22 0424  AST 16  --   --   ALT 15  --   --   ALKPHOS 106  --   --   BILITOT 0.5  --   --   PROT 7.5  --   --   ALBUMIN 3.4* 3.1* 3.0*   No results for input(s): "LIPASE", "AMYLASE" in the last 168 hours. No results for input(s): "AMMONIA" in the last 168 hours.  ABG    Component Value Date/Time   PHART 7.42 06/07/2022 0828   PCO2ART 60 (H) 06/07/2022 0828   PO2ART 68 (L) 06/07/2022 0828   HCO3 38.9 (H) 06/07/2022 0828   O2SAT 95.7 06/07/2022 0828     Coagulation Profile: No results for input(s): "INR", "PROTIME" in the last 168 hours.  Cardiac Enzymes: No results for input(s): "CKTOTAL", "CKMB", "CKMBINDEX", "TROPONINI" in the last 168 hours.  HbA1C: Hgb A1c MFr Bld  Date/Time Value Ref Range Status  06/07/2022 04:54 AM 9.0 (H) 4.8 - 5.6 % Final    Comment:    (NOTE) Pre diabetes:          5.7%-6.4%  Diabetes:              >6.4%  Glycemic control for   <7.0% adults with diabetes     CBG: Recent Labs  Lab 06/08/22 2324 06/09/22 0406 06/09/22 0727 06/09/22 1121 06/09/22 1528  GLUCAP 132* 147* 146* 240* 215*    Review of Systems:   Unable to assess due to intubation and sedation  Past Medical History:  She,  has a past medical history of Asthma,  Diabetes mellitus without complication ([place]), and Hypertension.   Surgical History:   Past Surgical History:  Procedure Laterality Date   carpel tunnel     COLONOSCOPY WITH PROPOFOL N/A 12/08/2014   Procedure: COLONOSCOPY WITH PROPOFOL;  Surgeon: Lollie Sails, MD;  Location: [facility] ENDOSCOPY;  Service: Endoscopy;  Laterality: N/A;   ESOPHAGOGASTRODUODENOSCOPY       Social History:   reports that she quit smoking about 5 years ago. Her smoking use included cigarettes. She has never used smokeless tobacco. She reports that she does not drink alcohol and does not use drugs.   Family History:  Her family history includes Breast cancer (age of onset: 70) in her paternal aunt; Cancer in her paternal grandmother.   Allergies Allergies  Allergen Reactions   Abilify [Aripiprazole] Other (See Comments)    Syncope   Effexor [Venlafaxine] Other (See Comments)    Hair Loss   Wellbutrin [Bupropion] Other (See Comments)    Acne      Home Medications  Prior to Admission medications   Medication Sig Start Date End Date Taking? Authorizing Provider  albuterol (PROVENTIL HFA;VENTOLIN HFA) 108 (90 BASE) MCG/ACT inhaler Inhale into the lungs every 6 (six) hours as needed for wheezing or shortness of breath.   Yes [provider]  atorvastatin (LIPITOR) 40 MG tablet Take 40 mg by mouth at bedtime. 05/06/20  Yes [provider]  cetirizine (ZYRTEC) 10 MG tablet Take 10 mg by mouth daily.   Yes [provider]  clindamycin (CLEOCIN T) 1 % external solution Apply 1 Application topically 2 (two) times daily.   Yes [provider]  cyanocobalamin 1000 MCG tablet Take 1,000 mcg by mouth daily.   Yes [provider]  enalapril (VASOTEC) 20 MG tablet Take 20 mg by mouth daily.   Yes [provider]  famotidine (PEPCID) 20 MG tablet Take 20 mg by mouth 2 (two) times daily.   Yes [provider]  fluticasone (FLOVENT HFA) 110 MCG/ACT inhaler  Inhale 2 puffs into the lungs 2 (two) times daily.   Yes [provider]  hydrochlorothiazide (HYDRODIURIL) 25 MG tablet Take 25 mg by mouth daily.   Yes [provider]  hydrOXYzine (ATARAX) 25 MG tablet Take 25 mg by mouth 3 (three) times daily as needed.   Yes [provider]  ipratropium (ATROVENT HFA) 17 MCG/ACT inhaler Inhale 2 puffs into the lungs every 4 (four) hours as needed for wheezing.   Yes [provider]  liraglutide (VICTOZA) 18 MG/3ML SOPN Inject 2.4 mg into the skin daily.   Yes [provider]  metFORMIN (GLUCOPHAGE-XR) 500 MG 24 hr tablet Take 1,000 mg by mouth 2 (two) times daily with a meal.   Yes [provider]     Critical care time: 40 minutes     Darel Hong, AGACNP-BC [facility] [place] epic messenger for cross cover needs If after hours, please call E-link

## 2022-06-10 ENCOUNTER — Inpatient Hospital Stay: Payer: Medicaid Other

## 2022-06-10 ENCOUNTER — Inpatient Hospital Stay: Payer: Self-pay

## 2022-06-10 DIAGNOSIS — J9601 Acute respiratory failure with hypoxia: Secondary | ICD-10-CM | POA: Diagnosis not present

## 2022-06-10 DIAGNOSIS — J9602 Acute respiratory failure with hypercapnia: Secondary | ICD-10-CM | POA: Diagnosis not present

## 2022-06-10 LAB — CBC
HCT: 41.7 % (ref 36.0–46.0)
Hemoglobin: 10.9 g/dL — ABNORMAL LOW (ref 12.0–15.0)
MCH: 18.1 pg — ABNORMAL LOW (ref 26.0–34.0)
MCHC: 26.1 g/dL — ABNORMAL LOW (ref 30.0–36.0)
MCV: 69.2 fL — ABNORMAL LOW (ref 80.0–100.0)
Platelets: 257 10*3/uL (ref 150–400)
RBC: 6.03 MIL/uL — ABNORMAL HIGH (ref 3.87–5.11)
RDW: 21.8 % — ABNORMAL HIGH (ref 11.5–15.5)
WBC: 14.2 10*3/uL — ABNORMAL HIGH (ref 4.0–10.5)
nRBC: 0.1 % (ref 0.0–0.2)

## 2022-06-10 LAB — GLUCOSE, CAPILLARY
Glucose-Capillary: 107 mg/dL — ABNORMAL HIGH (ref 70–99)
Glucose-Capillary: 109 mg/dL — ABNORMAL HIGH (ref 70–99)
Glucose-Capillary: 112 mg/dL — ABNORMAL HIGH (ref 70–99)
Glucose-Capillary: 137 mg/dL — ABNORMAL HIGH (ref 70–99)
Glucose-Capillary: 180 mg/dL — ABNORMAL HIGH (ref 70–99)
Glucose-Capillary: 230 mg/dL — ABNORMAL HIGH (ref 70–99)
Glucose-Capillary: 259 mg/dL — ABNORMAL HIGH (ref 70–99)

## 2022-06-10 LAB — RENAL FUNCTION PANEL
Albumin: 3.1 g/dL — ABNORMAL LOW (ref 3.5–5.0)
Anion gap: 10 (ref 5–15)
BUN: 18 mg/dL (ref 6–20)
CO2: 32 mmol/L (ref 22–32)
Calcium: 8.7 mg/dL — ABNORMAL LOW (ref 8.9–10.3)
Chloride: 97 mmol/L — ABNORMAL LOW (ref 98–111)
Creatinine, Ser: 0.55 mg/dL (ref 0.44–1.00)
GFR, Estimated: >60 mL/min (ref >60)
Glucose, Bld: 88 mg/dL (ref 70–99)
Phosphorus: 4.8 mg/dL — ABNORMAL HIGH (ref 2.5–4.6)
Potassium: 3.3 mmol/L — ABNORMAL LOW (ref 3.5–5.1)
Sodium: 139 mmol/L (ref 135–145)

## 2022-06-10 MED ORDER — METHYLPREDNISOLONE SODIUM SUCC 40 MG IJ SOLR
40.0000 mg | Freq: Two times a day (BID) | INTRAMUSCULAR | Status: DC
  Administered 2022-06-10 – 2022-06-12 (×4): 40 mg via INTRAVENOUS
  Filled 2022-06-10 (×4): qty 1

## 2022-06-10 MED ORDER — ETOMIDATE 2 MG/ML IV SOLN
INTRAVENOUS | Status: AC
  Administered 2022-06-10: 20 mg
  Filled 2022-06-10: qty 10

## 2022-06-10 MED ORDER — POTASSIUM CHLORIDE 20 MEQ PO PACK
20.0000 meq | PACK | ORAL | Status: AC
  Administered 2022-06-10: 20 meq
  Filled 2022-06-10 (×2): qty 1

## 2022-06-10 MED ORDER — IPRATROPIUM-ALBUTEROL 0.5-2.5 (3) MG/3ML IN SOLN
3.0000 mL | RESPIRATORY_TRACT | Status: DC
  Administered 2022-06-10 – 2022-06-23 (×77): 3 mL via RESPIRATORY_TRACT
  Filled 2022-06-10 (×77): qty 3

## 2022-06-10 MED ORDER — ROCURONIUM BROMIDE 10 MG/ML (PF) SYRINGE
PREFILLED_SYRINGE | INTRAVENOUS | Status: AC
  Administered 2022-06-10: 100 mg
  Filled 2022-06-10: qty 10

## 2022-06-10 MED ORDER — ENOXAPARIN SODIUM 80 MG/0.8ML IJ SOSY
0.5000 mg/kg | PREFILLED_SYRINGE | INTRAMUSCULAR | Status: DC
  Administered 2022-06-10 – 2022-06-17 (×8): 67.5 mg via SUBCUTANEOUS
  Filled 2022-06-10 (×8): qty 0.8

## 2022-06-10 MED ORDER — POTASSIUM CHLORIDE 10 MEQ/100ML IV SOLN
10.0000 meq | INTRAVENOUS | Status: AC
  Administered 2022-06-10 (×4): 10 meq via INTRAVENOUS
  Filled 2022-06-10 (×4): qty 100

## 2022-06-10 MED ORDER — FENTANYL CITRATE (PF) 100 MCG/2ML IJ SOLN
INTRAMUSCULAR | Status: AC
  Filled 2022-06-10: qty 2

## 2022-06-10 NOTE — Progress Notes (Signed)
NAME:  Desiree Stone, MRN:  [id number], DOB:  1975/08/21, LOS: 3 ADMISSION DATE:  06/06/2022, CONSULTATION DATE:  06/07/2022 REFERRING MD:  Dr. Tamala Julian, CHIEF COMPLAINT:  Shortness of breath   Brief Pt Description / Synopsis:  47 year old female admitted with acute hypoxic and hypercapnic respiratory failure in the setting of acute COPD exacerbation, new onset CHF, and suspected community-acquired pneumonia requiring intubation and mechanical ventilation.  History of Present Illness:  47 yo F presenting to [facility] ED from home with complaints of increasing shortness of breath. History provided per chart review, some items confirmed with her mother via phone interview. Patient reported on arrival to increased dyspnea over the past few months, worsening acutely in the last couple of days. She stated she could hardly walk down the hallway at the school and had to stop and rest. She denied outpatient oxygen use and has been unable to use her CPAP as she is unable to sleep in her bed. She instead has been sleeping in her chair for the past several weeks. She endorsed continued cigarette smoking. In addition she reported a productive cough with thick sputum and palpitations, as well as lower extremity edema. She denied difficulty swallowing, increased abdominal distention, or heart failure history. 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 Lab work significant for mild hyponatremia, hyperglycemia, leukocytosis, elevated but flat troponin and mildly elevated BNP. She was negative for COVID, RSV & influenza. Her respiratory status deteriorated and she was requiring 4 L [place]. CTa imaging was negative for PE, but did reveal cardiomegaly and an enlarged pulmonary artery suggestive of pulmonary hypertension. TRH was consulted for admission. The  patient's respiratory status did not improve while waiting for bed placement in the ED, with ABG demonstrating respiratory acidosis. The patient was placed on BIPAP. Follow up ABG's showed persistent respiratory acidosis even with setting adjustments. Eventually the decision was made to urgently intubate the patient and place her on mechanical ventilatory support. Medications given: lasix 40 mg, IV contrast, fentanyl, rocuronium & etomidate, lovenox, duoneb, solu medrol EKG Interpretation: Date: 06/06/22, EKG Time: 12:29, Rate: 104, Rhythm: ST, QRS Axis:  LAD, Intervals: normal, ST/T Wave abnormalities: non specific T wave abnormalities, Narrative Interpretation: ST with LAD and non specific T wave abnormalities Chemistry: Na+: 134, K+: 3.9, BUN/Cr.: 6/ 0.52, Serum CO2/ AG: 31/10, Cl: 93   CXR 06/06/22: no active cardiopulmonary disease. No evidence of pneumonia or pulmonary edema. Bronchitis/reactive airway disease, likely chronic. Borderline cardiomegaly CT soft tissue neck w contrast 06/06/22: no acute finding in the neck CT angio chest PE 06/06/22: no large central PE. The distal pulmonary arteries are not well evaluated due to the patient body habitus. No other evidence of acute cardiopulmonary pathology. Cardiomegaly with enlarged pulmonary artery suggesting pulmonary hypertension and refluxed contrast into the IVC, suggesting right heart dysfunction. Left adrenal nodule has been present since 2012 consistent with a benign adenoma.   PCCM consulted for admission due to worsening acute respiratory failure suspect multifocal secondary to acute new onset heart failure & suspected COPD in the setting of suspected pulmonary hypertension requiring emergent intubation and mechanical ventilatory support.  Please see "significant hospital events" section below for full detailed hospital course.  Pertinent  Medical History   Past Medical History:  Diagnosis Date   Asthma    Diabetes mellitus  without  complication ([place])    Hypertension     Micro Data:  1/16: SARS-CoV-2/influenza/RSV PCR>> negative 1/16: Group A strep PCR>> negative 1/17: MRSA PCR>> negative 1/17: HIV screen>> nonreactive 1/18: Blood culture x 2>> no growth to date 1/18: Tracheal aspirate>> gram-positive cocci  Antimicrobials:   Anti-infectives (From admission, onward)    Start     Dose/Rate Route Frequency Ordered Stop   06/09/22 1100  cefTRIAXone (ROCEPHIN) 2 g in sodium chloride 0.9 % 100 mL IVPB        2 g 200 mL/hr over 30 Minutes Intravenous Every 24 hours 06/09/22 1006 06/14/22 1059   06/07/22 1100  azithromycin (ZITHROMAX) tablet 250 mg        250 mg Per Tube Daily 06/07/22 1006 06/12/22 0959       Significant Hospital Events: Including procedures, antibiotic start and stop dates in addition to other pertinent events   06/07/22: Admit to ICU with worsening acute respiratory failure suspect secondary to acute new onset heart failure int he setting of suspected pulmonary hypertension & chronic COPD, OSA, asthma requiring emergent intubation and mechanical ventilatory support.  06/08/22- patient is s/p SBT with severe secretions, she did diurese >800cc today with lasix challenge, CXR with progressive pleural effusions. She has family coming in today. She's uisng OG and weve tranistioned to prednisone taper from solumedrol. Adding aldactone per tube. Iron workup today.  06/09/22-on minimal vent support, will attempt SBT.  Tracheal aspirate growing gram-positive cocci, ceftriaxone added 1/20 failed weaning trials, remains intubated  Interim History / Subjective:  Severe resp failure Failed weaning trials +secretions Sputum cultures + Remains critically ill   Vent Mode: PRVC FiO2 (%):  [35 %] 35 % Set Rate:  [24 bmp] 24 bmp Vt Set:  [380 mL] 380 mL PEEP:  [8 cmH20] 8 cmH20 Plateau Pressure:  [23 cmH20] 23 cmH20   Objective   Blood pressure 114/62, pulse 88, temperature 99.4 F (37.4 C), temperature  source Axillary, resp. rate (!) 24, height '4\' 11"'$  (1.499 m), weight 136 kg, last menstrual period 05/30/2022, SpO2 94 %.    Vent Mode: PRVC FiO2 (%):  [35 %] 35 % Set Rate:  [24 bmp] 24 bmp Vt Set:  [380 mL] 380 mL PEEP:  [8 cmH20] 8 cmH20 Plateau Pressure:  [23 cmH20] 23 cmH20   Intake/Output Summary (Last 24 hours) at 06/10/2022 0742 Last data filed at 06/10/2022 0700 Gross per 24 hour  Intake 1691.22 ml  Output 2590 ml  Net -898.78 ml    Filed Weights   06/06/22 1233 06/06/22 1539  Weight: 136.1 kg 136 kg     REVIEW OF SYSTEMS  PATIENT IS UNABLE TO PROVIDE COMPLETE REVIEW OF SYSTEMS DUE TO SEVERE CRITICAL ILLNESS   PHYSICAL EXAMINATION:  GENERAL:critically ill appearing, +resp distress EYES: Pupils equal, round, reactive to light.  No scleral icterus.  MOUTH: Moist mucosal membrane. INTUBATED NECK: Supple.  PULMONARY: Lungs clear to auscultation, +rhonchi, +wheezing CARDIOVASCULAR: S1 and S2.  Regular rate and rhythm GASTROINTESTINAL: Soft, nontender, -distended. Positive bowel sounds.  MUSCULOSKELETAL: No swelling, clubbing, or edema.  NEUROLOGIC: obtunded,sedated SKIN:normal, warm to touch, Capillary refill delayed  Pulses present bilaterally   Assessment & Plan:   47 yo morbidly obese AAF with Acute hypoxic and hypercapnic respiratory failure in the setting of acute COPD exacerbation, new onset CHF, and community-acquired pneumonia PMHx: OSA on CPAP, asthma, tobacco use  Severe ACUTE Hypoxic and Hypercapnic Respiratory Failure -continue Mechanical Ventilator support -Wean Fio2 and PEEP as tolerated -VAP/VENT  bundle implementation - Wean PEEP & FiO2 as tolerated, maintain SpO2 > 88% - Head of bed elevated 30 degrees, VAP protocol in place - Plateau pressures less than 30 cm H20  - Intermittent chest x-ray & ABG PRN - Ensure adequate pulmonary hygiene  -will perform SAT/SBT when respiratory parameters are met  SEVERE COPD EXACERBATION -continue IV steroids  as prescribed -continue NEB THERAPY as prescribed Continue IV ABX -morphine as needed -wean fio2 as needed and tolerated   INFECTIOUS DISEASE -continue antibiotics as prescribed -follow up cultures    CARDIAC #Elevated Troponin secondary to demand ischemia PMHx: HTN, HLD Patient reports orthopnea, dyspnea on exertion & BLE edema. CTa suggestive of Right heart dysfunction and pulmonary HTN -Continuous cardiac monitoring -Maintain MAP >65 -Vasopressors as needed to maintain MAP goal ~ requiring -Trend HS Troponin until peaked -Echocardiogram 06/07/22: LVEF 60 to 90%, normal diastolic parameters, RV function normal -Diuresis as renal function and blood pressure permits    NEUROLOGY ACUTE METABOLIC ENCEPHALOPATHY    ENDO - ICU hypoglycemic\Hyperglycemia protocol -check FSBS per protocol   GI GI PROPHYLAXIS as indicated  NUTRITIONAL STATUS DIET-->TF's as tolerated Constipation protocol as indicated   ELECTROLYTES -follow labs as needed -replace as needed -pharmacy consultation and following     Best Practice (right click and "Reselect all SmartList Selections" daily)   Diet/type: tubefeeds DVT prophylaxis: prophylactic heparin  GI prophylaxis: H2B Lines: N/A Foley:  Yes, and it is still needed Code Status:  full code Last date of multidisciplinary goals of care discussion [06/09/2022]  1/19: Pt's mother updated at bedside.  Labs   CBC: Recent Labs  Lab 06/06/22 1238 06/07/22 0454 06/08/22 0454 06/09/22 0421 06/10/22 0417  WBC 14.4* 14.9* 14.2* 15.5* 14.2*  HGB 12.0 12.0 11.1* 11.3* 10.9*  HCT 46.2* 47.2* 42.2 42.2 41.7  MCV 69.3* 71.5* 68.4* 68.2* 69.2*  PLT 312 297 267 273 257     Basic Metabolic Panel: Recent Labs  Lab 06/06/22 1238 06/07/22 0454 06/07/22 0500 06/08/22 0454 06/09/22 0421 06/09/22 0424 06/10/22 0417  NA 134* 137  --  137  --  139 139  K 3.9 4.9  --  3.6  --  3.4* 3.3*  CL 93* 94*  --  93*  --  95* 97*  CO2 31 34*   --  33*  --  34* 32  GLUCOSE 189* 228*  --  143*  --  121* 88  BUN 6 9  --  16  --  17 18  CREATININE 0.52 0.47  --  0.55  --  0.63 0.55  CALCIUM 8.6* 8.9  --  8.8*  --  8.7* 8.7*  MG  --   --  2.2 2.4 2.3  --   --   PHOS  --   --  4.8* 5.1*  --  5.3* 4.8*    GFR: Estimated Creatinine Clearance: 109 mL/min (by C-G formula based on SCr of 0.55 mg/dL). Recent Labs  Lab 06/07/22 0454 06/07/22 0500 06/08/22 0454 06/09/22 0421 06/10/22 0417  PROCALCITON  --  <0.10 <0.10 <0.10  --   WBC 14.9*  --  14.2* 15.5* 14.2*     Liver Function Tests: Recent Labs  Lab 06/06/22 1238 06/08/22 0454 06/09/22 0424 06/10/22 0417  AST 16  --   --   --   ALT 15  --   --   --   ALKPHOS 106  --   --   --   BILITOT 0.5  --   --   --  PROT 7.5  --   --   --   ALBUMIN 3.4* 3.1* 3.0* 3.1*    No results for input(s): "LIPASE", "AMYLASE" in the last 168 hours. No results for input(s): "AMMONIA" in the last 168 hours.  ABG    Component Value Date/Time   PHART 7.42 06/07/2022 0828   PCO2ART 60 (H) 06/07/2022 0828   PO2ART 68 (L) 06/07/2022 0828   HCO3 38.9 (H) 06/07/2022 0828   O2SAT 95.7 06/07/2022 0828     Coagulation Profile: No results for input(s): "INR", "PROTIME" in the last 168 hours.  Cardiac Enzymes: No results for input(s): "CKTOTAL", "CKMB", "CKMBINDEX", "TROPONINI" in the last 168 hours.  HbA1C: Hgb A1c MFr Bld  Date/Time Value Ref Range Status  06/07/2022 04:54 AM 9.0 (H) 4.8 - 5.6 % Final    Comment:    (NOTE) Pre diabetes:          5.7%-6.4%  Diabetes:              >6.4%  Glycemic control for   <7.0% adults with diabetes     CBG: Recent Labs  Lab 06/09/22 1121 06/09/22 1528 06/09/22 1928 06/10/22 0000 06/10/22 0336  GLUCAP 240* 215* 154* 112* 107*     DVT/GI PRX  assessed I Assessed the need for Labs I Assessed the need for Foley I Assessed the need for Central Venous Line Family Discussion when available I Assessed the need for Mobilization I  made an Assessment of medications to be adjusted accordingly Safety Risk assessment completed  CASE DISCUSSED IN MULTIDISCIPLINARY ROUNDS WITH ICU TEAM     Critical Care Time devoted to patient care services described in this note is 55 minutes.  Critical care was necessary to treat /prevent imminent and life-threatening deterioration. Overall, patient is critically ill, prognosis is guarded.  Patient with Multiorgan failure and at high risk for cardiac arrest and death.    Corrin Parker, M.D.  [facility]  Medical Director [place] Director [facility] Cardio-Pulmonary Department

## 2022-06-10 NOTE — Progress Notes (Signed)
Patient had an eventful day, at 33 patient's mom and sister were at bedside and sedation decreased by half for wake up assessment. Dr. Mortimer Fries updated as requested and at 1145 all sedation was turned off. Patient was placed on SVT and able to follow commands and Dr. Mortimer Fries ordered for patient to be extubated.  AT 1215 patient was extubated by RT and placed on 10L HFNC.  At 1239 patient's oxygen saturation went down to 84%, NP notified as well as RT, oxygen increased to 15L HFNC, NP ordered for for patient to be placed on heated high flow nasal cannula of which RT was notified of and setting up.  At 1244 patient was on heated high flow and oxygen saturation decreased to 80% with RR at 38 along with increased work of breathing and Dr. Mortimer Fries notified and new orders received for patient to be placed on cpap, RT in the room at this time as well and set cpap up.  By 1246 patient's oxygen saturation had declined to 58%with HR of 126, Dr. Mortimer Fries notified and patient's mom and sister continue at bedside who requested to speak with Dr. Mortimer Fries regarding patient's respiratory status and came to bedside.  At this time NP was also notified and came to bedside and assessed patient. Orders received for intubation kit to be brought to bedside along with orders for fentanyl 136mg IV, etomodate '20mg'$  IV, and rocuronium '100mg'$  IV.  Mom and sister were understanding and in agreement with need for intubation and mechanical ventilation.  Patient on cpap while equipment was set up and time-out performed.  At 1301 patients oxygen saturation was 70% on cpap.  At 1310 patient was intubated and back on mechanical vent. Orders for PICC line were also placed by Dr. KMortimer Fries  At 1800 PICC nurse informed this nurse that PICC could not be placed until Sunday morning.  NP updated on status of PICC line placement.  Will endorse to oncoming shift.

## 2022-06-10 NOTE — IPAL (Signed)
  Interdisciplinary Goals of Care Family Meeting   Date carried out: 06/10/2022  Location of the meeting: Bedside  Member's involved: Physician, Bedside Registered Nurse, and Family Member or next of kin   GOALS OF CARE DISCUSSION  The Clinical status was relayed to family in detail- Mother, Sisters  Updated and notified of patients medical condition- Patient remains unresponsive and will not open eyes to command.   Patient is having a weak cough and struggling to remove secretions.   Patient with increased WOB and using accessory muscles to breathe Explained to family course of therapy and the modalities    PATIENT REMAINS FULL CODE  Family understands the situation. Severe Tobacco abuse Uses CPAP for OSA +COPD +pneumonia Plan for weaning trials of sedation and assess for extubation  Family are satisfied with Plan of action and management. All questions answered  Additional CC time 35 mins   Desiree Stone, M.D.  [facility]  Medical Director [place] Director [facility] Cardio-Pulmonary Department

## 2022-06-10 NOTE — Consult Note (Addendum)
PHARMACY CONSULT NOTE - ELECTROLYTES  Pharmacy Consult for Electrolyte Monitoring and Replacement   Recent Labs: Potassium (mmol/L)  Date Value  06/10/2022 3.3 (L)   Magnesium (mg/dL)  Date Value  06/09/2022 2.3   Calcium (mg/dL)  Date Value  06/10/2022 8.7 (L)   Albumin (g/dL)  Date Value  06/10/2022 3.1 (L)   Phosphorus (mg/dL)  Date Value  06/10/2022 4.8 (H)   Sodium (mmol/L)  Date Value  06/10/2022 139   Assessment  Ivan A Maradiaga is a 47 y.o. female presenting with AHRF. 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 Pharmacy has been consulted to monitor and replace electrolytes.  Diet: NPO Pertinent medications: Lasix 40 mg IV BID  Goal of Therapy: Electrolytes WNL  Plan:  Potassium: 3.4 >> 3.3, Kcl 20 mEq per tube q4h x 2 & Kcl 10 mEq IV q1h x 4 Magnesium: 2.4 >> 2.3, no replacement needed Phosphorus: 5.3 >> 4.8, elevated, continue to monitor Renal function panel and Mg ordered per primary team for tomorrow  Thank you for allowing pharmacy to be a part of this patient's care.  Benita Gutter 06/10/2022 7:07 AM

## 2022-06-10 NOTE — Plan of Care (Signed)
Continuing with plan of care. 

## 2022-06-10 NOTE — Progress Notes (Signed)
[facility] ADULT ICU REPLACEMENT PROTOCOL   The patient does apply for the [facility] Adult ICU Electrolyte Replacment Protocol based on the criteria listed below:   1.Exclusion criteria: TCTS, ECMO, Dialysis, and Myasthenia Gravis patients 2. Is GFR >/= 30 ml/min? Yes.    Patient's GFR today is >60 3. Is SCr </= 2? Yes.   Patient's SCr is 0.55 mg/dL 4. Did SCr increase >/= 0.5 in 24 hours? No. 5.Pt's weight >40kg  Yes.   6. Abnormal electrolyte(s): K 3.3  7. Electrolytes replaced per protocol 8.  Call MD STAT for K+ </= 2.5, Phos </= 1, or Mag </= 1 Physician:    Billy Fischer 06/10/2022 6:49 AM

## 2022-06-10 NOTE — Procedures (Signed)
Endotracheal Intubation: Patient required placement of an artificial airway secondary to acute hypoxic respiratory failure   Consent: Emergent.    Hand washing performed prior to starting the procedure.    Medications administered for sedation prior to procedure:  Fentanyl 100 mcg IV, 20 mg Etomidate IV, Rocuronium 100 mg IV     A time out procedure was called and correct patient, name, & ID confirmed. Needed supplies and equipment were assembled and checked to include ETT, 10 ml syringe, Glidescope, Mac and Miller blades, suction, oxygen and bag mask valve, end tidal CO2 monitor.    Patient was positioned to align the mouth and pharynx to facilitate visualization of the glottis.    Heart rate, SpO2 and blood pressure was continuously monitored during the procedure. Pre-oxygenation was conducted prior to intubation and endotracheal tube was placed through the vocal cords into the trachea.       The artificial airway was placed under direct visualization via glidescope route using a 8 cm ETT on the first attempt.   ETT was secured at 23 cm .  Placement was confirmed by auscuitation of lungs with good breath sounds bilaterally and no stomach sounds.  Condensation was noted on endotracheal tube.   Pulse ox 100%  CO2 detector in place with appropriate color change.    Complications: None .        Chest radiograph ordered and pending.   Donell Beers, [place] Pager [phone] (please enter 7 digits) PCCM Consult Pager [phone] (please enter 7 digits)

## 2022-06-11 DIAGNOSIS — J9601 Acute respiratory failure with hypoxia: Secondary | ICD-10-CM | POA: Diagnosis not present

## 2022-06-11 DIAGNOSIS — J9602 Acute respiratory failure with hypercapnia: Secondary | ICD-10-CM | POA: Diagnosis not present

## 2022-06-11 LAB — GLUCOSE, CAPILLARY
Glucose-Capillary: 117 mg/dL — ABNORMAL HIGH (ref 70–99)
Glucose-Capillary: 158 mg/dL — ABNORMAL HIGH (ref 70–99)
Glucose-Capillary: 198 mg/dL — ABNORMAL HIGH (ref 70–99)
Glucose-Capillary: 211 mg/dL — ABNORMAL HIGH (ref 70–99)
Glucose-Capillary: 226 mg/dL — ABNORMAL HIGH (ref 70–99)
Glucose-Capillary: 243 mg/dL — ABNORMAL HIGH (ref 70–99)

## 2022-06-11 LAB — CULTURE, RESPIRATORY W GRAM STAIN: Culture: NORMAL

## 2022-06-11 LAB — CBC
HCT: 45.8 % (ref 36.0–46.0)
Hemoglobin: 11.6 g/dL — ABNORMAL LOW (ref 12.0–15.0)
MCH: 18.2 pg — ABNORMAL LOW (ref 26.0–34.0)
MCHC: 25.3 g/dL — ABNORMAL LOW (ref 30.0–36.0)
MCV: 71.9 fL — ABNORMAL LOW (ref 80.0–100.0)
Platelets: 249 10*3/uL (ref 150–400)
RBC: 6.37 MIL/uL — ABNORMAL HIGH (ref 3.87–5.11)
RDW: 21.7 % — ABNORMAL HIGH (ref 11.5–15.5)
WBC: 18 10*3/uL — ABNORMAL HIGH (ref 4.0–10.5)
nRBC: 0 % (ref 0.0–0.2)

## 2022-06-11 LAB — RENAL FUNCTION PANEL
Albumin: 3.4 g/dL — ABNORMAL LOW (ref 3.5–5.0)
Anion gap: 10 (ref 5–15)
BUN: 18 mg/dL (ref 6–20)
CO2: 30 mmol/L (ref 22–32)
Calcium: 8.9 mg/dL (ref 8.9–10.3)
Chloride: 98 mmol/L (ref 98–111)
Creatinine, Ser: 0.58 mg/dL (ref 0.44–1.00)
GFR, Estimated: >60 mL/min (ref >60)
Glucose, Bld: 177 mg/dL — ABNORMAL HIGH (ref 70–99)
Phosphorus: 4.1 mg/dL (ref 2.5–4.6)
Potassium: 4.5 mmol/L (ref 3.5–5.1)
Sodium: 138 mmol/L (ref 135–145)

## 2022-06-11 LAB — TRIGLYCERIDES: Triglycerides: 218 mg/dL — ABNORMAL HIGH (ref <150)

## 2022-06-11 LAB — MAGNESIUM: Magnesium: 2.4 mg/dL (ref 1.7–2.4)

## 2022-06-11 MED ORDER — LINEZOLID 600 MG PO TABS
600.0000 mg | ORAL_TABLET | Freq: Two times a day (BID) | ORAL | Status: DC
  Administered 2022-06-11: 600 mg
  Filled 2022-06-11 (×3): qty 1

## 2022-06-11 MED ORDER — PIPERACILLIN-TAZOBACTAM 3.375 G IVPB
3.3750 g | Freq: Three times a day (TID) | INTRAVENOUS | Status: DC
  Administered 2022-06-11 – 2022-06-13 (×6): 3.375 g via INTRAVENOUS
  Filled 2022-06-11 (×6): qty 50

## 2022-06-11 MED ORDER — CHLORHEXIDINE GLUCONATE CLOTH 2 % EX PADS
6.0000 | MEDICATED_PAD | Freq: Every day | CUTANEOUS | Status: DC
  Administered 2022-06-12 – 2022-06-21 (×9): 6 via TOPICAL

## 2022-06-11 MED ORDER — SODIUM CHLORIDE 0.9% FLUSH
10.0000 mL | Freq: Two times a day (BID) | INTRAVENOUS | Status: DC
  Administered 2022-06-11 – 2022-06-19 (×13): 10 mL
  Administered 2022-06-19: 20 mL
  Administered 2022-06-20 – 2022-06-24 (×8): 10 mL
  Administered 2022-06-25: 30 mL
  Administered 2022-06-25 – 2022-06-27 (×4): 10 mL

## 2022-06-11 MED ORDER — SODIUM CHLORIDE 0.9% FLUSH
10.0000 mL | INTRAVENOUS | Status: DC | PRN
  Administered 2022-06-16: 10 mL

## 2022-06-11 MED ORDER — NOREPINEPHRINE 4 MG/250ML-% IV SOLN: 0.0000 ug/min | INTRAVENOUS | Status: DC

## 2022-06-11 MED ORDER — LINEZOLID 600 MG PO TABS
600.0000 mg | ORAL_TABLET | Freq: Two times a day (BID) | ORAL | Status: DC
  Administered 2022-06-12 – 2022-06-13 (×3): 600 mg
  Filled 2022-06-11 (×3): qty 1

## 2022-06-11 NOTE — Progress Notes (Signed)
Peripherally Inserted Central Catheter Placement  The IV Nurse has discussed with the patient and/or persons authorized to consent for the patient, the purpose of this procedure and the potential benefits and risks involved with this procedure.  The benefits include less needle sticks, lab draws from the catheter, and the patient may be discharged home with the catheter. Risks include, but not limited to, infection, bleeding, blood clot (thrombus formation), and puncture of an artery; nerve damage and irregular heartbeat and possibility to perform a PICC exchange if needed/ordered by physician.  Alternatives to this procedure were also discussed.  Bard Power PICC patient education guide, fact sheet on infection prevention and patient information card has been provided to patient /or left at bedside.    PICC Placement Documentation  PICC Triple Lumen 08/65/78 Right Basilic 43 cm 0 cm (Active)  Indication for Insertion or Continuance of Line Vasoactive infusions 06/11/22 0949  Exposed Catheter (cm) 0 cm 06/11/22 0949  Site Assessment Clean, Dry, Intact 06/11/22 0949  Lumen #1 Status Saline locked;Flushed;Blood return noted 06/11/22 0949  Lumen #2 Status Saline locked;Flushed;Blood return noted 06/11/22 0949  Lumen #3 Status Saline locked;Flushed;Blood return noted 06/11/22 0949  Dressing Type Transparent;Securing device 06/11/22 0949  Dressing Status Antimicrobial disc in place;Clean, Dry, Intact 06/11/22 0949  Safety Lock Not Applicable 46/96/29 5284  Line Care Connections checked and tightened 06/11/22 0949  Line Adjustment (NICU/IV Team Only) No 06/11/22 0949  Dressing Intervention New dressing 06/11/22 0949  Dressing Change Due 06/18/22 06/11/22 [facility]       Desiree Stone 06/11/2022, 9:50 AM

## 2022-06-11 NOTE — Progress Notes (Signed)
IV team at bedside to place PICC line.

## 2022-06-11 NOTE — Progress Notes (Signed)
Per Dr. Rich Number, keep patient sedated today and defer wake up assessment.

## 2022-06-11 NOTE — Progress Notes (Signed)
NAME:  Desiree Stone, MRN:  [id number], DOB:  1976/03/05, LOS: 4 ADMISSION DATE:  06/06/2022, CONSULTATION DATE:  06/07/2022 REFERRING MD:  Dr. Tamala Julian, CHIEF COMPLAINT:  Shortness of breath   Brief Pt Description / Synopsis:  47 year old female admitted with acute hypoxic and hypercapnic respiratory failure in the setting of acute COPD exacerbation, new onset CHF, and suspected community-acquired pneumonia requiring intubation and mechanical ventilation.  History of Present Illness:  47 yo F presenting to [facility] ED from home with complaints of increasing shortness of breath. History provided per chart review, some items confirmed with her mother via phone interview. Patient reported on arrival to increased dyspnea over the past few months, worsening acutely in the last couple of days. She stated she could hardly walk down the hallway at the school and had to stop and rest. She denied outpatient oxygen use and has been unable to use her CPAP as she is unable to sleep in her bed. She instead has been sleeping in her chair for the past several weeks. She endorsed continued cigarette smoking. In addition she reported a productive cough with thick sputum and palpitations, as well as lower extremity edema. She denied difficulty swallowing, increased abdominal distention, or heart failure history. 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 Lab work significant for mild hyponatremia, hyperglycemia, leukocytosis, elevated but flat troponin and mildly elevated BNP. She was negative for COVID, RSV & influenza. Her respiratory status deteriorated and she was requiring 4 L [place]. CTa imaging was negative for PE, but did reveal cardiomegaly and an enlarged pulmonary artery suggestive of pulmonary hypertension. TRH was consulted for admission. The  patient's respiratory status did not improve while waiting for bed placement in the ED, with ABG demonstrating respiratory acidosis. The patient was placed on BIPAP. Follow up ABG's showed persistent respiratory acidosis even with setting adjustments. Eventually the decision was made to urgently intubate the patient and place her on mechanical ventilatory support. Medications given: lasix 40 mg, IV contrast, fentanyl, rocuronium & etomidate, lovenox, duoneb, solu medrol EKG Interpretation: Date: 06/06/22, EKG Time: 12:29, Rate: 104, Rhythm: ST, QRS Axis:  LAD, Intervals: normal, ST/T Wave abnormalities: non specific T wave abnormalities, Narrative Interpretation: ST with LAD and non specific T wave abnormalities Chemistry: Na+: 134, K+: 3.9, BUN/Cr.: 6/ 0.52, Serum CO2/ AG: 31/10, Cl: 93   CXR 06/06/22: no active cardiopulmonary disease. No evidence of pneumonia or pulmonary edema. Bronchitis/reactive airway disease, likely chronic. Borderline cardiomegaly CT soft tissue neck w contrast 06/06/22: no acute finding in the neck CT angio chest PE 06/06/22: no large central PE. The distal pulmonary arteries are not well evaluated due to the patient body habitus. No other evidence of acute cardiopulmonary pathology. Cardiomegaly with enlarged pulmonary artery suggesting pulmonary hypertension and refluxed contrast into the IVC, suggesting right heart dysfunction. Left adrenal nodule has been present since 2012 consistent with a benign adenoma.   PCCM consulted for admission due to worsening acute respiratory failure suspect multifocal secondary to acute new onset heart failure & suspected COPD in the setting of suspected pulmonary hypertension requiring emergent intubation and mechanical ventilatory support.  Please see "significant hospital events" section below for full detailed hospital course.  Pertinent  Medical History   Past Medical History:  Diagnosis Date   Asthma    Diabetes mellitus  without  complication ([place])    Hypertension     Micro Data:  1/16: SARS-CoV-2/influenza/RSV PCR>> negative 1/16: Group A strep PCR>> negative 1/17: MRSA PCR>> negative 1/17: HIV screen>> nonreactive 1/18: Blood culture x 2>> no growth to date 1/18: Tracheal aspirate>> gram-positive cocci  Antimicrobials:   Anti-infectives (From admission, onward)    Start     Dose/Rate Route Frequency Ordered Stop   06/09/22 1100  cefTRIAXone (ROCEPHIN) 2 g in sodium chloride 0.9 % 100 mL IVPB        2 g 200 mL/hr over 30 Minutes Intravenous Every 24 hours 06/09/22 1006 06/14/22 1059   06/07/22 1100  azithromycin (ZITHROMAX) tablet 250 mg        250 mg Per Tube Daily 06/07/22 1006 06/12/22 0959       Significant Hospital Events: Including procedures, antibiotic start and stop dates in addition to other pertinent events   06/07/22: Admit to ICU with worsening acute respiratory failure suspect secondary to acute new onset heart failure int he setting of suspected pulmonary hypertension & chronic COPD, OSA, asthma requiring emergent intubation and mechanical ventilatory support.  06/08/22- patient is s/p SBT with severe secretions, she did diurese >800cc today with lasix challenge, CXR with progressive pleural effusions. She has family coming in today. She's uisng OG and weve tranistioned to prednisone taper from solumedrol. Adding aldactone per tube. Iron workup today.  06/09/22-on minimal vent support, will attempt SBT.  Tracheal aspirate growing gram-positive cocci, ceftriaxone added 1/20 extubated and failed, re-intubated 1/21 severe COPD, remains on vent  Interim History / Subjective:  Severe COPD Severe resp failure Remains on vent Severe hypoxia Remains critically ill Likely need trach in setting of morbid obesity and severe COPD/pneumonia   Vent Mode: PRVC FiO2 (%):  [35 %-80 %] 60 % Set Rate:  [24 bmp] 24 bmp Vt Set:  [0.8 mL-380 mL] 0.8 mL PEEP:  [8 cmH20] 8 cmH20 Plateau Pressure:  [26  cmH20] 26 cmH20   Objective   Blood pressure 118/66, pulse 87, temperature (!) 101.3 F (38.5 C), resp. rate (!) 25, height '4\' 11"'$  (1.499 m), weight 136 kg, last menstrual period 05/30/2022, SpO2 93 %.    Vent Mode: PRVC FiO2 (%):  [35 %-80 %] 60 % Set Rate:  [24 bmp] 24 bmp Vt Set:  [0.8 mL-380 mL] 0.8 mL PEEP:  [8 cmH20] 8 cmH20 Plateau Pressure:  [26 cmH20] 26 cmH20   Intake/Output Summary (Last 24 hours) at 06/11/2022 0719 Last data filed at 06/11/2022 0701 Gross per 24 hour  Intake 2340.06 ml  Output 2385 ml  Net -44.94 ml    Filed Weights   06/06/22 1233 06/06/22 1539  Weight: 136.1 kg 136 kg      REVIEW OF SYSTEMS  PATIENT IS UNABLE TO PROVIDE COMPLETE REVIEW OF SYSTEMS DUE TO SEVERE CRITICAL ILLNESS   PHYSICAL EXAMINATION:  GENERAL:critically ill appearing, +resp distress EYES: Pupils equal, round, reactive to light.  No scleral icterus.  MOUTH: Moist mucosal membrane. INTUBATED NECK: Supple.  PULMONARY: Lungs clear to auscultation, +rhonchi, +wheezing CARDIOVASCULAR: S1 and S2.  Regular rate and rhythm GASTROINTESTINAL: Soft, nontender, -distended. Positive bowel sounds.  MUSCULOSKELETAL: No swelling, clubbing, or edema.  NEUROLOGIC: obtunded,sedated SKIN:normal, warm to touch, Capillary refill delayed  Pulses present bilaterally  Assessment & Plan:   47 yo morbidly obese AAF with Acute hypoxic and hypercapnic respiratory failure in the setting of acute COPD exacerbation, new onset CHF, and community-acquired pneumonia, failure to wean from vent failed trial of  extubation and emergebntly re-intubated PMHx: OSA on CPAP, asthma, tobacco use  Severe ACUTE Hypoxic and Hypercapnic Respiratory Failure -continue Mechanical Ventilator support -Wean Fio2 and PEEP as tolerated -VAP/VENT bundle implementation - Wean PEEP & FiO2 as tolerated, maintain SpO2 > 88% - Head of bed elevated 30 degrees, VAP protocol in place - Plateau pressures less than 30 cm H20  -  Intermittent chest x-ray & ABG PRN - Ensure adequate pulmonary hygiene  Patient will likely need TRACH UNABLE TO WEAN FROM VENT TODAY DUE TO SEVERE HYPOXIA/COPD  SEVERE COPD EXACERBATION -continue IV steroids as prescribed -continue NEB THERAPY as prescribed  INFECTIOUS DISEASE -continue antibiotics as prescribed -follow up cultures    CARDIAC #Elevated Troponin secondary to demand ischemia PMHx: HTN, HLD Patient reports orthopnea, dyspnea on exertion & BLE edema. CTa suggestive of Right heart dysfunction and pulmonary HTN -Continuous cardiac monitoring -Maintain MAP >65 -Vasopressors as needed to maintain MAP goal ~ requiring -Trend HS Troponin until peaked -Echocardiogram 06/07/22: LVEF 60 to 07%, normal diastolic parameters, RV function normal -Diuresis as renal function and blood pressure permits   NEUROLOGY ACUTE METABOLIC ENCEPHALOPATHY -need for sedation -Goal RASS -2    ENDO - ICU hypoglycemic\Hyperglycemia protocol -check FSBS per protocol   GI GI PROPHYLAXIS as indicated  NUTRITIONAL STATUS DIET-->TF's as tolerated Constipation protocol as indicated   ELECTROLYTES -follow labs as needed -replace as needed -pharmacy consultation and following      Best Practice (right click and "Reselect all SmartList Selections" daily)   Diet/type: tubefeeds DVT prophylaxis: prophylactic heparin  GI prophylaxis: H2B Lines: N/A Foley:  Yes, and it is still needed Code Status:  full code Last date of multidisciplinary goals of care discussion [06/09/2022]  1/19: Pt's mother updated at bedside. 1/20- family updated  Labs   CBC: Recent Labs  Lab 06/07/22 0454 06/08/22 0454 06/09/22 0421 06/10/22 0417 06/11/22 0421  WBC 14.9* 14.2* 15.5* 14.2* 18.0*  HGB 12.0 11.1* 11.3* 10.9* 11.6*  HCT 47.2* 42.2 42.2 41.7 45.8  MCV 71.5* 68.4* 68.2* 69.2* 71.9*  PLT 297 267 273 257 249     Basic Metabolic Panel: Recent Labs  Lab 06/07/22 0454 06/07/22 0500  06/08/22 0454 06/09/22 0421 06/09/22 0424 06/10/22 0417 06/11/22 0421  NA 137  --  137  --  139 139 138  K 4.9  --  3.6  --  3.4* 3.3* 4.5  CL 94*  --  93*  --  95* 97* 98  CO2 34*  --  33*  --  34* 32 30  GLUCOSE 228*  --  143*  --  121* 88 177*  BUN 9  --  16  --  '17 18 18  '$ CREATININE 0.47  --  0.55  --  0.63 0.55 0.58  CALCIUM 8.9  --  8.8*  --  8.7* 8.7* 8.9  MG  --  2.2 2.4 2.3  --   --  2.4  PHOS  --  4.8* 5.1*  --  5.3* 4.8* 4.1    GFR: Estimated Creatinine Clearance: 109 mL/min (by C-G formula based on SCr of 0.58 mg/dL). Recent Labs  Lab 06/07/22 0500 06/08/22 0454 06/09/22 0421 06/10/22 0417 06/11/22 0421  PROCALCITON <0.10 <0.10 <0.10  --   --   WBC  --  14.2* 15.5* 14.2* 18.0*     Liver Function Tests: Recent Labs  Lab 06/06/22 1238 06/08/22 0454 06/09/22 0424 06/10/22 0417 06/11/22 0421  AST 16  --   --   --   --  ALT 15  --   --   --   --   ALKPHOS 106  --   --   --   --   BILITOT 0.5  --   --   --   --   PROT 7.5  --   --   --   --   ALBUMIN 3.4* 3.1* 3.0* 3.1* 3.4*    No results for input(s): "LIPASE", "AMYLASE" in the last 168 hours. No results for input(s): "AMMONIA" in the last 168 hours.  ABG    Component Value Date/Time   PHART 7.42 06/07/2022 0828   PCO2ART 60 (H) 06/07/2022 0828   PO2ART 68 (L) 06/07/2022 0828   HCO3 38.9 (H) 06/07/2022 0828   O2SAT 95.7 06/07/2022 0828     Coagulation Profile: No results for input(s): "INR", "PROTIME" in the last 168 hours.  Cardiac Enzymes: No results for input(s): "CKTOTAL", "CKMB", "CKMBINDEX", "TROPONINI" in the last 168 hours.  HbA1C: Hgb A1c MFr Bld  Date/Time Value Ref Range Status  06/07/2022 04:54 AM 9.0 (H) 4.8 - 5.6 % Final    Comment:    (NOTE) Pre diabetes:          5.7%-6.4%  Diabetes:              >6.4%  Glycemic control for   <7.0% adults with diabetes     CBG: Recent Labs  Lab 06/10/22 1521 06/10/22 1913 06/10/22 2344 06/11/22 0429 06/11/22 0717  GLUCAP  259* 230* 109* 226* 198*      DVT/GI PRX  assessed I Assessed the need for Labs I Assessed the need for Foley I Assessed the need for Central Venous Line Family Discussion when available I Assessed the need for Mobilization I made an Assessment of medications to be adjusted accordingly Safety Risk assessment completed  CASE DISCUSSED IN MULTIDISCIPLINARY ROUNDS WITH ICU TEAM   Critical Care Time devoted to patient care services described in this note is 55 minutes.   Critical care was necessary to treat /prevent imminent and life-threatening deterioration.   Corrin Parker, M.D.  [facility]  Medical Director [place] Director [facility] Cardio-Pulmonary Department

## 2022-06-11 NOTE — Plan of Care (Signed)
Continuing with plan of care. 

## 2022-06-11 NOTE — Consult Note (Signed)
PHARMACY CONSULT NOTE - ELECTROLYTES  Pharmacy Consult for Electrolyte Monitoring and Replacement   Recent Labs: Potassium (mmol/L)  Date Value  06/11/2022 4.5   Magnesium (mg/dL)  Date Value  06/11/2022 2.4   Calcium (mg/dL)  Date Value  06/11/2022 8.9   Albumin (g/dL)  Date Value  06/11/2022 3.4 (L)   Phosphorus (mg/dL)  Date Value  06/11/2022 4.1   Sodium (mmol/L)  Date Value  06/11/2022 138   Assessment  Desiree Stone is a 47 y.o. female presenting with AHRF. 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 Pharmacy has been consulted to monitor and replace electrolytes.  Diet: NPO Pertinent medications: Lasix 40 mg IV BID  Goal of Therapy: Electrolytes WNL  Plan:  Potassium: 3.3 >> 4.5, no replacement needed Magnesium: 2.3 >> 2.4, no replacement needed Phosphorus: 4.8 >> 4.1, elevated, continue to monitor Renal function panel ordered per primary team for tomorrow  Thank you for allowing pharmacy to be a part of this patient's care.  Benita Gutter 06/11/2022 7:07 AM

## 2022-06-12 DIAGNOSIS — J9601 Acute respiratory failure with hypoxia: Secondary | ICD-10-CM | POA: Diagnosis not present

## 2022-06-12 DIAGNOSIS — J9602 Acute respiratory failure with hypercapnia: Secondary | ICD-10-CM | POA: Diagnosis not present

## 2022-06-12 LAB — CBC
HCT: 42.1 % (ref 36.0–46.0)
Hemoglobin: 10.6 g/dL — ABNORMAL LOW (ref 12.0–15.0)
MCH: 18.3 pg — ABNORMAL LOW (ref 26.0–34.0)
MCHC: 25.2 g/dL — ABNORMAL LOW (ref 30.0–36.0)
MCV: 72.7 fL — ABNORMAL LOW (ref 80.0–100.0)
Platelets: 250 10*3/uL (ref 150–400)
RBC: 5.79 MIL/uL — ABNORMAL HIGH (ref 3.87–5.11)
RDW: 21.7 % — ABNORMAL HIGH (ref 11.5–15.5)
WBC: 16.1 10*3/uL — ABNORMAL HIGH (ref 4.0–10.5)
nRBC: 0 % (ref 0.0–0.2)

## 2022-06-12 LAB — GLUCOSE, CAPILLARY
Glucose-Capillary: 161 mg/dL — ABNORMAL HIGH (ref 70–99)
Glucose-Capillary: 195 mg/dL — ABNORMAL HIGH (ref 70–99)
Glucose-Capillary: 150 mg/dL — ABNORMAL HIGH (ref 70–99)
Glucose-Capillary: 215 mg/dL — ABNORMAL HIGH (ref 70–99)
Glucose-Capillary: 195 mg/dL — ABNORMAL HIGH (ref 70–99)
Glucose-Capillary: 137 mg/dL — ABNORMAL HIGH (ref 70–99)

## 2022-06-12 LAB — RENAL FUNCTION PANEL
Albumin: 3.1 g/dL — ABNORMAL LOW (ref 3.5–5.0)
Anion gap: 9 (ref 5–15)
BUN: 23 mg/dL — ABNORMAL HIGH (ref 6–20)
CO2: 33 mmol/L — ABNORMAL HIGH (ref 22–32)
Calcium: 8.7 mg/dL — ABNORMAL LOW (ref 8.9–10.3)
Chloride: 94 mmol/L — ABNORMAL LOW (ref 98–111)
Creatinine, Ser: 0.62 mg/dL (ref 0.44–1.00)
GFR, Estimated: >60 mL/min (ref >60)
Glucose, Bld: 183 mg/dL — ABNORMAL HIGH (ref 70–99)
Phosphorus: 4 mg/dL (ref 2.5–4.6)
Potassium: 4.4 mmol/L (ref 3.5–5.1)
Sodium: 136 mmol/L (ref 135–145)

## 2022-06-12 MED ORDER — METHYLPREDNISOLONE SODIUM SUCC 40 MG IJ SOLR
20.0000 mg | Freq: Two times a day (BID) | INTRAMUSCULAR | Status: DC
  Administered 2022-06-12 – 2022-06-17 (×10): 20 mg via INTRAVENOUS
  Filled 2022-06-12 (×10): qty 1

## 2022-06-12 MED ORDER — SENNOSIDES-DOCUSATE SODIUM 8.6-50 MG PO TABS
2.0000 | ORAL_TABLET | Freq: Two times a day (BID) | ORAL | Status: DC
  Administered 2022-06-12 – 2022-06-14 (×5): 2
  Filled 2022-06-12 (×5): qty 2

## 2022-06-12 NOTE — Plan of Care (Signed)
Pt continued on full vent support, plan is to talk to family about trach. Pt is easily agitated and restless. Pt will open eyes and move all extremities and follow simple command when sedation's is turned off. Pt on tube feeding at 30 mL/hr with every 4 hrs flush. Bowel regime started today and no BM yet. Pt  urine out adequate for last 12 hrs.  Family updated today. Pt is not progressing toward goal.

## 2022-06-12 NOTE — TOC Progression Note (Signed)
Transition of Care [facility]) - Progression Note    Patient Details  Name: Desiree Stone MRN: [id number] Date of Birth: July 01, 1975  Transition of Care [facility]) CM/SW Contact  Shelbie Hutching, RN Phone Number: 06/12/2022, 1:52 PM  Clinical Narrative:    Patient remains in ICU intubated and sedated.  Failed extubation on 1/20 and had to be re-intubated. TOC will cont to follow.   Expected Discharge Plan:  (TBD) Barriers to Discharge: Continued Medical Work up  Expected Discharge Plan and Services   Discharge Planning Services: CM Consult   Living arrangements for the past 2 months: Single Family Home                                       Social Determinants of Health (SDOH) Interventions SDOH Screenings   Physical Activity: Sufficiently Active (05/23/2017)  Social Connections: Moderately Isolated (05/23/2017)  Stress: No Stress Concern Present (05/23/2017)  Tobacco Use: Medium Risk (06/06/2022)    Readmission Risk Interventions     No data to display

## 2022-06-12 NOTE — Progress Notes (Signed)
Nutrition Follow Up Note   DOCUMENTATION CODES:   Morbid obesity  INTERVENTION:   Continue Vital HP '@20ml'$ /hr + ProSource TF 20- Give 47m QID via tube  Propofol: 44.9 ml/hr- provides 1185kcal/day   Free water flushes 353mq4 hours to maintain tube patency   Regimen provides 1985kcal/day, 122g/day protein and 58130may of free water.   Liquid MVI daily via tube   Daily weights   Recommend IV iron supplementation.   NUTRITION DIAGNOSIS:   Inadequate oral intake related to inability to eat (pt sedated and ventilated) as evidenced by NPO status. -ongoing   GOAL:   Provide needs based on ASPEN/SCCM guidelines -met   MONITOR:   Vent status, Labs, Weight trends, TF tolerance, I & O's, Skin  ASSESSMENT:   [age]o female with h/o OSA, HTN, DM, asthma, morbid obesity and uterine fibroid who is admitted with new COPD and CHF.  Pt remains sedated and ventilated. OGT in place; pt tolerating tube feeds at goal rate. Pt remains on a high rate of propofol. Pt has been unable to wean from the vent and may require tracheostomy/PEG. No new weight since 1/16; pt is ordered for daily weights. No BM since admission plan is for suppository today. Pt -4.2L on her I  & Os; pt continues to receive diuresis. Pt noted to have IDA; recommend IV iron supplementation when appropriate.    Medications reviewed and include: colace, lovenox, pepcid, lasix, linezolid, insulin, solu-medrol, MVI, miralax, senokot, zosyn, propofol   Labs reviewed: K 4.4 wnl, BUN 23(H), P 4.0 wnl Mg 2.4 wnl- 1/21 Iron 32, TIBC 518(H), ferritin 12- 1/18 Wbc- 16.1(H), Hgb 10.6(L), MCV 72.7(L), MCH 18.3(L), MCHC 25.2(H) Cbgs- 150, 195, 161 x 24 hrs  Patient is currently intubated on ventilator support MV: 9.2 L/min Temp (24hrs), Avg:100.1 F (37.8 C), Min:99 F (37.2 C), Max:101.3 F (38.5 C)  Propofol: 44.9 ml/hr- provides 1185kcal/day   MAP- >3m57m  UOP- [id number] Order:   Diet Order             Diet  NPO time specified  Diet effective now                  EDUCATION NEEDS:   No education needs have been identified at this time  Skin:  Skin Assessment: Reviewed RN Assessment  Last BM:  PTA  Height:   Ht Readings from Last 1 Encounters:  06/08/22 '4\' 11"'$  (1.499 m)    Weight:   Wt Readings from Last 1 Encounters:  05/13/20 136.1 kg    Ideal Body Weight:  44.5 kg  BMI:  Body mass index is 60.56 kg/m.  Estimated Nutritional Needs:   Kcal:  1898kcal/day  Protein:  >110g/day  Fluid:  1.4-1.6L/day  CaseyKoleen DistanceRD, LDN Please refer to AMION[facility] CenterRD and/or RD on-call/weekend/after hours pager

## 2022-06-12 NOTE — Progress Notes (Signed)
   06/12/22 1300  Spiritual Encounters  Type of Visit Initial  Care provided to: Pt and family  Conversation partners present during encounter Other (comment) (Family)  Referral source IDT Rounds  Reason for visit Routine spiritual support  OnCall Visit No  Interventions  Spiritual Care Interventions Made Compassionate presence  Spiritual Care Plan  Spiritual Care Issues Still Outstanding Chaplain will continue to follow   Spoke to Family advise how to reach the [facility] Care department. Will continue to check on PT in the near future while during routine rounds.

## 2022-06-12 NOTE — Progress Notes (Signed)
NAME:  Desiree Stone, MRN:  [id number], DOB:  [birth year], LOS: 5 ADMISSION DATE:  06/06/2022, CONSULTATION DATE:  06/07/2022 REFERRING MD:  Dr. Tamala Julian, CHIEF COMPLAINT:  Shortness of breath   Brief Pt Description / Synopsis:  47 year old female admitted with acute hypoxic and hypercapnic respiratory failure in the setting of acute COPD exacerbation, new onset CHF, and suspected community-acquired pneumonia requiring intubation and mechanical ventilation.  History of Present Illness:  Desiree Stone presenting to [facility] ED from home with complaints of increasing shortness of breath. History provided per chart review, some items confirmed with her mother via phone interview. Patient reported on arrival to increased dyspnea over the past few months, worsening acutely in the last couple of days. She stated she could hardly walk down the hallway at the school and had to stop and rest. She denied outpatient oxygen use and has been unable to use her CPAP as she is unable to sleep in her bed. She instead has been sleeping in her chair for the past several weeks. She endorsed continued cigarette smoking. In addition she reported a productive cough with thick sputum and palpitations, as well as lower extremity edema. She denied difficulty swallowing, increased abdominal distention, or heart failure history. 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 No evidence of pneumonia or pulmonary edema. Bronchitis/reactive airway disease, likely chronic. Borderline cardiomegaly CT soft tissue neck w contrast 06/06/22: no acute finding in the neck CT angio chest PE 06/06/22: no large central PE. The distal pulmonary arteries are not well evaluated due to the patient body habitus. No other evidence of acute cardiopulmonary pathology. Cardiomegaly with enlarged pulmonary artery suggesting pulmonary hypertension and refluxed contrast into the IVC,  suggesting right heart dysfunction. Left adrenal nodule has been present since 2012 consistent with a benign adenoma.   PCCM consulted for admission due to worsening acute respiratory failure suspect multifocal secondary to acute new onset heart failure & suspected COPD in the setting of suspected pulmonary hypertension requiring emergent intubation and mechanical ventilatory support.  Please see "significant hospital events" section below for full detailed hospital course.  Pertinent  Medical History   Past Medical History:  Diagnosis Date   Asthma    Diabetes mellitus without complication ([place])    Hypertension     Micro Data:  1/16: SARS-CoV-2/influenza/RSV PCR>> negative 1/16: Group A strep PCR>> negative 1/17: MRSA PCR>> negative 1/17: HIV screen>> nonreactive 1/18: Blood culture x 2>> no growth to date 1/18: Tracheal aspirate>> gram-positive cocci  Antimicrobials:   Anti-infectives (From admission, onward)    Start     Dose/Rate Route Frequency Ordered Stop   06/12/22 0500  linezolid (ZYVOX) tablet 600 mg        600 mg Per Tube Every 12 hours 06/11/22 2301 06/16/22 0959   06/11/22 1400  piperacillin-tazobactam (ZOSYN) IVPB 3.375 g        3.375 g 12.5 mL/hr over 240 Minutes Intravenous Every 8 hours 06/11/22 1131 06/16/22 0559   06/11/22 1230  linezolid (ZYVOX) tablet 600 mg  Status:  Discontinued        600 mg Per Tube Every 12 hours 06/11/22 1131 06/11/22 2301   06/09/22 1100  cefTRIAXone (ROCEPHIN) 2 g in sodium chloride 0.9 % 100 mL IVPB  Status:  Discontinued        2 g 200 mL/hr over 30 Minutes Intravenous Every 24 hours 06/09/22 1006 06/11/22 1129   06/07/22  1100  azithromycin (ZITHROMAX) tablet 250 mg        250 mg Per Tube Daily 06/07/22 1006 06/11/22 0843       Significant Hospital Events: Including procedures, antibiotic start and stop dates in addition to other pertinent events   06/07/22: Admit to ICU with worsening acute respiratory failure suspect  secondary to acute new onset heart failure int he setting of suspected pulmonary hypertension & chronic COPD, OSA, asthma requiring emergent intubation and mechanical ventilatory support.  06/08/22- patient is s/p SBT with severe secretions, she did diurese >800cc today with lasix challenge, CXR with progressive pleural effusions. She has family coming in today. She's uisng OG and weve tranistioned to prednisone taper from solumedrol. Adding aldactone per tube. Iron workup today.  06/09/22-on minimal vent support, will attempt SBT.  Tracheal aspirate growing gram-positive cocci, ceftriaxone added 1/20 extubated and failed, re-intubated 1/21 severe COPD, remains on vent  Interim History / Subjective:  Severe COPD Severe resp failure Remains on vent Severe hypoxia Remains critically ill Likely need trach in setting of morbid obesity and severe COPD/pneumonia   Vent Mode: PRVC FiO2 (%):  [45 %-60 %] 45 % Set Rate:  [24 bmp] 24 bmp Vt Set:  [380 mL] 380 mL PEEP:  [8 cmH20] 8 cmH20 Plateau Pressure:  [24 cmH20] 24 cmH20   Objective   Blood pressure (!) Desiree/52, pulse 84, temperature 99.5 Stone (37.5 C), resp. rate (!) 24, height '4\' 11"'$  (1.499 m), weight 136 kg, last menstrual period 05/30/2022, SpO2 98 %.    Vent Mode: PRVC FiO2 (%):  [45 %-60 %] 45 % Set Rate:  [24 bmp] 24 bmp Vt Set:  [380 mL] 380 mL PEEP:  [8 cmH20] 8 cmH20 Plateau Pressure:  [24 cmH20] 24 cmH20   Intake/Output Summary (Last 24 hours) at 06/12/2022 0732 Last data filed at 06/12/2022 0938 Gross per 24 hour  Intake 1223.59 ml  Output 2455 ml  Net -1231.41 ml    Filed Weights   06/06/22 1233 06/06/22 1539  Weight: 136.1 kg 136 kg      REVIEW OF SYSTEMS  PATIENT IS UNABLE TO PROVIDE COMPLETE REVIEW OF SYSTEMS DUE TO SEVERE CRITICAL ILLNESS   PHYSICAL EXAMINATION:  GENERAL:critically ill appearing, +resp distress EYES: Pupils equal, round, reactive to light.  No scleral icterus.  MOUTH: Moist mucosal  membrane. INTUBATED NECK: Supple.  PULMONARY: Lungs clear to auscultation, +rhonchi, +wheezing CARDIOVASCULAR: S1 and S2.  Regular rate and rhythm GASTROINTESTINAL: Soft, nontender, -distended. Positive bowel sounds.  MUSCULOSKELETAL: No swelling, clubbing, or edema.  NEUROLOGIC: obtunded,sedated SKIN:normal, warm to touch, Capillary refill delayed  Pulses present bilaterally   Assessment & Plan:   47 yo morbidly obese AAF with Acute hypoxic and hypercapnic respiratory failure in the setting of acute COPD exacerbation, new onset CHF, and community-acquired pneumonia, failure to wean from vent failed trial of extubation and emergently re-intubated PMHx: OSA on CPAP, asthma, tobacco use  Severe ACUTE Hypoxic and Hypercapnic Respiratory Failure -continue Mechanical Ventilator support -Wean Fio2 and PEEP as tolerated -VAP/VENT bundle implementation - Wean PEEP & FiO2 as tolerated, maintain SpO2 > 88% - Head of bed elevated 30 degrees, VAP protocol in place - Plateau pressures less than 30 cm H20  - Intermittent chest x-ray & ABG PRN - Ensure adequate pulmonary hygiene  UNABLE TO WEAN FROM VENT TODAY DUE TO SEVERE HYPOXIA/COPD Patient may need TRACH  SEVERE COPD EXACERBATION -continue IV steroids as prescribed -continue NEB THERAPY as prescribed  INFECTIOUS DISEASE -continue antibiotics as  prescribed -follow up cultures    CARDIAC  Elevated Troponin secondary to demand ischemia  - Trend troponins  - consider systemic anticoagulation as troponin trend suggests - continuous cardiac monitoring   NEUROLOGY ACUTE METABOLIC ENCEPHALOPATHY -need for sedation -Goal RASS -2 to -3   ENDO - ICU hypoglycemic\Hyperglycemia protocol -check FSBS per protocol   GI GI PROPHYLAXIS as indicated  NUTRITIONAL STATUS DIET-->TF's as tolerated Constipation protocol as indicated   ELECTROLYTES -follow labs as needed -replace as needed -pharmacy consultation and  following      Best Practice (right click and "Reselect all SmartList Selections" daily)   Diet/type: tubefeeds DVT prophylaxis: prophylactic heparin  GI prophylaxis: H2B Lines: N/A Foley:  Yes, and it is still needed Code Status:  full code  1/19: Pt's mother updated at bedside. 1/20- family updated  Labs   CBC: Recent Labs  Lab 06/08/22 0454 06/09/22 0421 06/10/22 0417 06/11/22 0421 06/12/22 0451  WBC 14.2* 15.5* 14.2* 18.0* 16.1*  HGB 11.1* 11.3* 10.9* 11.6* 10.6*  HCT 42.2 42.2 41.7 45.8 42.1  MCV 68.4* 68.2* 69.2* 71.9* 72.7*  PLT 267 273 257 249 250     Basic Metabolic Panel: Recent Labs  Lab 06/07/22 0500 06/08/22 0454 06/09/22 0421 06/09/22 0424 06/10/22 0417 06/11/22 0421 06/12/22 0451  NA  --  137  --  139 139 138 136  K  --  3.6  --  3.4* 3.3* 4.5 4.4  CL  --  93*  --  95* Desiree* 98 94*  CO2  --  33*  --  34* 32 30 33*  GLUCOSE  --  143*  --  121* 88 177* 183*  BUN  --  16  --  '17 18 18 '$ 23*  CREATININE  --  0.55  --  0.63 0.55 0.58 0.62  CALCIUM  --  8.8*  --  8.7* 8.7* 8.9 8.7*  MG 2.2 2.4 2.3  --   --  2.4  --   PHOS 4.8* 5.1*  --  5.3* 4.8* 4.1 4.0    GFR: Estimated Creatinine Clearance: 109 mL/min (by C-G formula based on SCr of 0.62 mg/dL). Recent Labs  Lab 06/07/22 0500 06/08/22 0454 06/09/22 0421 06/10/22 0417 06/11/22 0421 06/12/22 0451  PROCALCITON <0.10 <0.10 <0.10  --   --   --   WBC  --  14.2* 15.5* 14.2* 18.0* 16.1*     Liver Function Tests: Recent Labs  Lab 06/06/22 1238 06/08/22 0454 06/09/22 0424 06/10/22 0417 06/11/22 0421 06/12/22 0451  AST 16  --   --   --   --   --   ALT 15  --   --   --   --   --   ALKPHOS 106  --   --   --   --   --   BILITOT 0.5  --   --   --   --   --   PROT 7.5  --   --   --   --   --   ALBUMIN 3.4* 3.1* 3.0* 3.1* 3.4* 3.1*    No results for input(s): "LIPASE", "AMYLASE" in the last 168 hours. No results for input(s): "AMMONIA" in the last 168 hours.  ABG    Component Value  Date/Time   PHART 7.42 06/07/2022 0828   PCO2ART 60 (H) 06/07/2022 0828   PO2ART 68 (L) 06/07/2022 0828   HCO3 38.9 (H) 06/07/2022 0828   O2SAT 95.7 06/07/2022 0828  DVT/GI PRX  assessed I Assessed the need for Labs I Assessed the need for Foley I Assessed the need for Central Venous Line Family Discussion when available I Assessed the need for Mobilization I made an Assessment of medications to be adjusted accordingly Safety Risk assessment completed  CASE DISCUSSED IN MULTIDISCIPLINARY ROUNDS WITH ICU TEAM     Critical Care Time devoted to patient care services described in this note is 47 minutes.  Critical care was necessary to treat /prevent imminent and life-threatening deterioration. Overall, patient is critically ill, prognosis is guarded.  Patient with Multiorgan failure and at high risk for cardiac arrest and death.    Corrin Parker, M.D.  [facility]  Medical Director [place] Director [facility] Cardio-Pulmonary Department

## 2022-06-12 NOTE — Consult Note (Signed)
PHARMACY CONSULT NOTE - ELECTROLYTES  Pharmacy Consult for Electrolyte Monitoring and Replacement   Recent Labs: Potassium (mmol/L)  Date Value  06/12/2022 4.4   Magnesium (mg/dL)  Date Value  06/11/2022 2.4   Calcium (mg/dL)  Date Value  06/12/2022 8.7 (L)   Albumin (g/dL)  Date Value  06/12/2022 3.1 (L)   Phosphorus (mg/dL)  Date Value  06/12/2022 4.0   Sodium (mmol/L)  Date Value  06/12/2022 136   Assessment  Desiree Stone is a 47 y.o. female presenting with AHRF. 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 Pharmacy has been consulted to monitor and replace electrolytes.  Diet/feeding: Vital High Protein 65m/hr, FW 326mq4h Pertinent medications: Lasix 40 mg IV BID  Goal of Therapy: Electrolytes WNL  Plan:  - No replacement indicated - Will recheck electrolytes with AM labs  Thank you for allowing pharmacy to be a part of this patient's care.  Tamrah Victorino A Verona Hartshorn 06/12/2022 7:20 AM

## 2022-06-13 ENCOUNTER — Inpatient Hospital Stay: Payer: Medicaid Other

## 2022-06-13 DIAGNOSIS — J9602 Acute respiratory failure with hypercapnia: Secondary | ICD-10-CM | POA: Diagnosis not present

## 2022-06-13 DIAGNOSIS — J9601 Acute respiratory failure with hypoxia: Secondary | ICD-10-CM | POA: Diagnosis not present

## 2022-06-13 LAB — CBC
HCT: 41.4 % (ref 36.0–46.0)
Hemoglobin: 10.9 g/dL — ABNORMAL LOW (ref 12.0–15.0)
MCH: 19.3 pg — ABNORMAL LOW (ref 26.0–34.0)
MCHC: 26.3 g/dL — ABNORMAL LOW (ref 30.0–36.0)
MCV: 73.4 fL — ABNORMAL LOW (ref 80.0–100.0)
Platelets: 242 10*3/uL (ref 150–400)
RBC: 5.64 MIL/uL — ABNORMAL HIGH (ref 3.87–5.11)
RDW: 22.1 % — ABNORMAL HIGH (ref 11.5–15.5)
WBC: 15.7 10*3/uL — ABNORMAL HIGH (ref 4.0–10.5)
nRBC: 0 % (ref 0.0–0.2)

## 2022-06-13 LAB — RENAL FUNCTION PANEL
Albumin: 3.2 g/dL — ABNORMAL LOW (ref 3.5–5.0)
Anion gap: 10 (ref 5–15)
BUN: 27 mg/dL — ABNORMAL HIGH (ref 6–20)
CO2: 35 mmol/L — ABNORMAL HIGH (ref 22–32)
Calcium: 9 mg/dL (ref 8.9–10.3)
Chloride: 94 mmol/L — ABNORMAL LOW (ref 98–111)
Creatinine, Ser: 0.61 mg/dL (ref 0.44–1.00)
GFR, Estimated: >60 mL/min (ref >60)
Glucose, Bld: 179 mg/dL — ABNORMAL HIGH (ref 70–99)
Phosphorus: 5.1 mg/dL — ABNORMAL HIGH (ref 2.5–4.6)
Potassium: 4.1 mmol/L (ref 3.5–5.1)
Sodium: 139 mmol/L (ref 135–145)

## 2022-06-13 LAB — CULTURE, BLOOD (ROUTINE X 2)
Culture: NO GROWTH
Culture: NO GROWTH

## 2022-06-13 LAB — MAGNESIUM: Magnesium: 2.6 mg/dL — ABNORMAL HIGH (ref 1.7–2.4)

## 2022-06-13 LAB — GLUCOSE, CAPILLARY
Glucose-Capillary: 147 mg/dL — ABNORMAL HIGH (ref 70–99)
Glucose-Capillary: 149 mg/dL — ABNORMAL HIGH (ref 70–99)
Glucose-Capillary: 159 mg/dL — ABNORMAL HIGH (ref 70–99)
Glucose-Capillary: 185 mg/dL — ABNORMAL HIGH (ref 70–99)
Glucose-Capillary: 224 mg/dL — ABNORMAL HIGH (ref 70–99)
Glucose-Capillary: 225 mg/dL — ABNORMAL HIGH (ref 70–99)

## 2022-06-13 LAB — PROTIME-INR
INR: 1.1 (ref 0.8–1.2)
Prothrombin Time: 14.2 seconds (ref 11.4–15.2)

## 2022-06-13 MED ORDER — SODIUM CHLORIDE 0.9 % IV SOLN
300.0000 mg | INTRAVENOUS | Status: AC
  Administered 2022-06-13 – 2022-06-15 (×3): 300 mg via INTRAVENOUS
  Filled 2022-06-13 (×3): qty 300

## 2022-06-13 MED ORDER — SODIUM CHLORIDE 0.9 % IV SOLN
3.0000 g | Freq: Four times a day (QID) | INTRAVENOUS | Status: AC
  Administered 2022-06-13 – 2022-06-16 (×13): 3 g via INTRAVENOUS
  Filled 2022-06-13: qty 3
  Filled 2022-06-13: qty 8
  Filled 2022-06-13 (×3): qty 3
  Filled 2022-06-13 (×8): qty 8

## 2022-06-13 MED ORDER — VITAL HIGH PROTEIN PO LIQD
1000.0000 mL | ORAL | Status: DC
  Administered 2022-06-13 – 2022-06-14 (×2): 1000 mL

## 2022-06-13 MED ORDER — INSULIN ASPART 100 UNIT/ML IJ SOLN
5.0000 [IU] | INTRAMUSCULAR | Status: DC
  Administered 2022-06-13 – 2022-06-19 (×35): 5 [IU] via SUBCUTANEOUS
  Filled 2022-06-13 (×35): qty 1

## 2022-06-13 MED ORDER — PROSOURCE TF20 ENFIT COMPATIBL EN LIQD
60.0000 mL | Freq: Three times a day (TID) | ENTERAL | Status: DC
  Administered 2022-06-13 – 2022-06-15 (×6): 60 mL

## 2022-06-13 NOTE — Progress Notes (Signed)
Nutrition Brief Follow Up Note   Propofol change   INTERVENTION:   Increase Vital HP to 82m/hr + ProSource TF 20- Give 6105mTID via tube  Propofol: 32.6 ml/hr- provides 860kcal/day   Free water flushes 3070m4 hours to maintain tube patency   Regimen provides 1820kcal/day, 123g/day protein and 782m27my of free water.   Liquid MVI daily via tube   Daily weights   Estimated Nutritional Needs:   Kcal:  1898kcal/day  Protein:  >110g/day  Fluid:  1.4-1.6L/day  CaseKoleen Distance RD, LDN Please refer to [facility] and/or RD on-call/weekend/after hours pager

## 2022-06-13 NOTE — Consult Note (Signed)
Desiree Stone, Fate [id number] [birth year] Desiree Lipps, MD  Reason for Consult: Failed extubation possible tracheostomy tube placement  HPI: History is noted in the chart patient was intubated for exacerbation of pulmonary disease, attempted extubation has failed ENT has been asked for evaluation possible tracheostomy tube placement.  Allergies:  Allergies  Allergen Reactions   Abilify [Aripiprazole] Other (See Comments)    Syncope   Effexor [Venlafaxine] Other (See Comments)    Hair Loss   Wellbutrin [Bupropion] Other (See Comments)    Acne     ROS: Review of systems normal other than 12 systems except per HPI.  PMH:  Past Medical History:  Diagnosis Date   Asthma    Diabetes mellitus without complication ([place])    Hypertension     FH:  Family History  Problem Relation Age of Onset   Breast cancer Paternal Aunt 9   Cancer Paternal Grandmother        Colon    SH:  Social History   Socioeconomic History   Marital status: Single    Spouse name: Not on file   Number of children: Not on file   Years of education: Not on file   Highest education level: Not on file  Occupational History   Not on file  Tobacco Use   Smoking status: Former    Packs/day: 0.00    Types: Cigarettes    Quit date: 04/04/2017    Years since quitting: 5.1   Smokeless tobacco: Never  Vaping Use   Vaping Use: Never used  Substance and Sexual Activity   Alcohol use: No   Drug use: No   Sexual activity: Not Currently    Birth control/protection: None  Other Topics Concern   Not on file  Social History Narrative   Not on file   Social Determinants of Health   Financial Resource Strain: Not on file  Food Insecurity: Not on file  Transportation Needs: Not on file  Physical Activity: Sufficiently Active (05/23/2017)   Exercise Vital Sign    Days of Exercise per Week: 5 days    Minutes of Exercise per Session: 60 min  Stress: No Stress Concern Present (05/23/2017)   [place]    Feeling of Stress : Not at all  Social Connections: Moderately Isolated (05/23/2017)   Social Connection and Isolation Panel [NHANES]    Frequency of Communication with Friends and Family: More than three times a week    Frequency of Social Gatherings with Friends and Family: Twice a week    Attends Religious Services: Never    Marine scientist or Organizations: No    Attends Archivist Meetings: Never    Marital Status: Never married  Intimate Partner Violence: Not At Risk (05/23/2017)   Humiliation, Afraid, Rape, and Kick questionnaire    Fear of Current or Ex-Partner: No    Emotionally Abused: No    Physically Abused: No    Sexually Abused: No    PSH:  Past Surgical History:  Procedure Laterality Date   carpel tunnel     COLONOSCOPY WITH PROPOFOL N/A 12/08/2014   Procedure: COLONOSCOPY WITH PROPOFOL;  Surgeon: Lollie Sails, MD;  [facility] END[facility]COPY;  Service: Endoscopy;  Laterality: N/A;   ESOPHAGOGASTRODUODENOSCOPY      Physical  Exam: Patient is intubated moderately sedated, endotracheal tube in position anterior nose benign the ears appear normal the anterior neck showed no evidence of scar or review of surgery.  A/P: Failure to extubate-Desiree Stone's mom and sister were at bedside.  Long discussion with them this afternoon regarding possible tracheostomy tube placement.  I explained in detail about the procedure.  Discussed the risk and benefits including bleeding infection possible death.  They are hopeful that she will be extubated before the tracheostomy tube is placed however if she does not with her history of lung disease and obstructive sleep apnea, for which she wears CPAP at home, tracheostomy tube placement would be indicated.  I will check with scheduling in the operating room and tentatively schedule her for trach tube placement as soon as OR time becomes available for ENT.  ICU time  approximately 45 minutes   Desiree Stone 06/13/2022 5:20 PM

## 2022-06-13 NOTE — Consult Note (Signed)
PHARMACY CONSULT NOTE - ELECTROLYTES  Pharmacy Consult for Electrolyte Monitoring and Replacement   Recent Labs: Potassium (mmol/L)  Date Value  06/13/2022 4.1   Magnesium (mg/dL)  Date Value  06/13/2022 2.6 (H)   Calcium (mg/dL)  Date Value  06/13/2022 9.0   Albumin (g/dL)  Date Value  06/13/2022 3.2 (L)   Phosphorus (mg/dL)  Date Value  06/13/2022 5.1 (H)   Sodium (mmol/L)  Date Value  06/13/2022 139   Assessment  Desiree Stone is a 47 y.o. female presenting with AHRF. 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 Pharmacy has been consulted to monitor and replace electrolytes.  Diet/feeding: Vital High Protein 22m/hr, FW [id number] Pertinent medications: Lasix 40 mg IV BID  Goal of Therapy: Electrolytes WNL  Plan:  - No replacement indicated - Will recheck electrolytes with AM labs  Thank you for allowing pharmacy to be a part of this patient's care.  WaPearla Dubonnet/23/2024 7:35 AM

## 2022-06-13 NOTE — Progress Notes (Signed)
NAME:  Desiree Stone, MRN:  [id number], DOB:  1976/04/15, LOS: 6 ADMISSION DATE:  06/06/2022, CONSULTATION DATE:  06/07/2022 REFERRING MD:  Dr. Tamala Julian, CHIEF COMPLAINT:  Shortness of breath   Brief Pt Description / Synopsis:  47 year old female admitted with acute hypoxic and hypercapnic respiratory failure in the setting of acute COPD exacerbation, new onset CHF, and suspected community-acquired pneumonia requiring intubation and mechanical ventilation.  History of Present Illness:  47 yo F presenting to [facility] ED from home with complaints of increasing shortness of breath. History provided per chart review, some items confirmed with her mother via phone interview. Patient reported on arrival to increased dyspnea over the past few months, worsening acutely in the last couple of days. She stated she could hardly walk down the hallway at the school and had to stop and rest. She denied outpatient oxygen use and has been unable to use her CPAP as she is unable to sleep in her bed. She instead has been sleeping in her chair for the past several weeks. She endorsed continued cigarette smoking. In addition she reported a productive cough with thick sputum and palpitations, as well as lower extremity edema. She denied difficulty swallowing, increased abdominal distention, or heart failure history. 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 No evidence of pneumonia or pulmonary edema. Bronchitis/reactive airway disease, likely chronic. Borderline cardiomegaly CT soft tissue neck w contrast 06/06/22: no acute finding in the neck CT angio chest PE 06/06/22: no large central PE. The distal pulmonary arteries are not well evaluated due to the patient body habitus. No other evidence of acute cardiopulmonary pathology. Cardiomegaly with enlarged pulmonary artery suggesting pulmonary hypertension and refluxed contrast into the IVC,  suggesting right heart dysfunction. Left adrenal nodule has been present since 2012 consistent with a benign adenoma.   PCCM consulted for admission due to worsening acute respiratory failure suspect multifocal secondary to acute new onset heart failure & suspected COPD in the setting of suspected pulmonary hypertension requiring emergent intubation and mechanical ventilatory support.  Please see "significant hospital events" section below for full detailed hospital course.  Pertinent  Medical History   Past Medical History:  Diagnosis Date   Asthma    Diabetes mellitus without complication ([place])    Hypertension     Micro Data:  1/16: SARS-CoV-2/influenza/RSV PCR>> negative 1/16: Group A strep PCR>> negative 1/17: MRSA PCR>> negative 1/17: HIV screen>> nonreactive 1/18: Blood culture x 2>> no growth to date 1/18: Tracheal aspirate>> gram-positive cocci  Antimicrobials:   Anti-infectives (From admission, onward)    Start     Dose/Rate Route Frequency Ordered Stop   06/12/22 0500  linezolid (ZYVOX) tablet 600 mg        600 mg Per Tube Every 12 hours 06/11/22 2301 06/16/22 0959   06/11/22 1400  piperacillin-tazobactam (ZOSYN) IVPB 3.375 g        3.375 g 12.5 mL/hr over 240 Minutes Intravenous Every 8 hours 06/11/22 1131 06/16/22 0559   06/11/22 1230  linezolid (ZYVOX) tablet 600 mg  Status:  Discontinued        600 mg Per Tube Every 12 hours 06/11/22 1131 06/11/22 2301   06/09/22 1100  cefTRIAXone (ROCEPHIN) 2 g in sodium chloride 0.9 % 100 mL IVPB  Status:  Discontinued        2 g 200 mL/hr over 30 Minutes Intravenous Every 24 hours 06/09/22 1006 06/11/22 1129   06/07/22  1100  azithromycin (ZITHROMAX) tablet 250 mg        250 mg Per Tube Daily 06/07/22 1006 06/11/22 0843       Significant Hospital Events: Including procedures, antibiotic start and stop dates in addition to other pertinent events   06/07/22: Admit to ICU with worsening acute respiratory failure suspect  secondary to acute new onset heart failure int he setting of suspected pulmonary hypertension & chronic COPD, OSA, asthma requiring emergent intubation and mechanical ventilatory support.  06/08/22- patient is s/p SBT with severe secretions, she did diurese >800cc today with lasix challenge, CXR with progressive pleural effusions. She has family coming in today. She's uisng OG and weve tranistioned to prednisone taper from solumedrol. Adding aldactone per tube. Iron workup today.  06/09/22-on minimal vent support, will attempt SBT.  Tracheal aspirate growing gram-positive cocci, ceftriaxone added 1/20 extubated and failed, re-intubated 1/21 severe COPD, remains on vent 1/22 severe COPD, remains on vent  Interim History / Subjective:  Severe COPD Morbid obesity Severe resp failure Remains on vent Remains critically ill Likely need trach in setting of morbid obesity and severe COPD/pneumonia   Vent Mode: PRVC FiO2 (%):  [40 %-45 %] 40 % Set Rate:  [24 bmp] 24 bmp Vt Set:  [380 mL] 380 mL PEEP:  [8 cmH20] 8 cmH20 Plateau Pressure:  [23 cmH20] 23 cmH20   Objective   Blood pressure 113/70, pulse 80, temperature (!) 97.5 F (36.4 C), resp. rate (!) 24, height '4\' 11"'$  (1.499 m), weight 136 kg, last menstrual period 05/30/2022, SpO2 91 %.    Vent Mode: PRVC FiO2 (%):  [40 %-45 %] 40 % Set Rate:  [24 bmp] 24 bmp Vt Set:  [380 mL] 380 mL PEEP:  [8 cmH20] 8 cmH20 Plateau Pressure:  [23 cmH20] 23 cmH20   Intake/Output Summary (Last 24 hours) at 06/13/2022 3734 Last data filed at 06/13/2022 0600 Gross per 24 hour  Intake 2765.5 ml  Output 2700 ml  Net 65.5 ml    Filed Weights   06/06/22 1233 06/06/22 1539  Weight: 136.1 kg 136 kg      REVIEW OF SYSTEMS  PATIENT IS UNABLE TO PROVIDE COMPLETE REVIEW OF SYSTEMS DUE TO SEVERE CRITICAL ILLNESS   PHYSICAL EXAMINATION:  GENERAL:critically ill appearing, +resp distress EYES: Pupils equal, round, reactive to light.  No scleral icterus.   MOUTH: Moist mucosal membrane. INTUBATED NECK: Supple.  PULMONARY: Lungs clear to auscultation, +rhonchi, +wheezing CARDIOVASCULAR: S1 and S2.  Regular rate and rhythm GASTROINTESTINAL: Soft, nontender, -distended. Positive bowel sounds.  MUSCULOSKELETAL: No swelling, clubbing, or edema.  NEUROLOGIC: obtunded,sedated SKIN:normal, warm to touch, Capillary refill delayed  Pulses present bilaterally    Assessment & Plan:   47 yo morbidly obese AAF with Acute hypoxic and hypercapnic respiratory failure in the setting of acute COPD exacerbation, new onset CHF, and community-acquired pneumonia, failure to wean from vent failed trial of extubation and emergently re-intubated PMHx: OSA on CPAP, asthma, tobacco use  Severe ACUTE Hypoxic and Hypercapnic Respiratory Failure -continue Mechanical Ventilator support -Wean Fio2 and PEEP as tolerated -VAP/VENT bundle implementation - Wean PEEP & FiO2 as tolerated, maintain SpO2 > 88% - Head of bed elevated 30 degrees, VAP protocol in place - Plateau pressures less than 30 cm H20  - Intermittent chest x-ray & ABG PRN - Ensure adequate pulmonary hygiene  UNABLE TO WEAN FROM VENT TODAY DUE TO SEVERE HYPOXIA/COPD Patient may need TRACH   SEVERE COPD EXACERBATION -continue IV steroids as prescribed -continue NEB THERAPY  as prescribed    INFECTIOUS DISEASE -continue antibiotics as prescribed -follow up cultures    CARDIAC  Elevated Troponin secondary to demand ischemia  - Trend troponins  - consider systemic anticoagulation as troponin trend suggests - continuous cardiac monitoring   NEUROLOGY ACUTE METABOLIC ENCEPHALOPATHY -need for sedation -Goal RASS -2 to -3  ENDO - ICU hypoglycemic\Hyperglycemia protocol -check FSBS per protocol   GI GI PROPHYLAXIS as indicated  NUTRITIONAL STATUS DIET-->TF's as tolerated Constipation protocol as indicated   ELECTROLYTES -follow labs as needed -replace as needed -pharmacy  consultation and following   Best Practice (right click and "Reselect all SmartList Selections" daily)   Diet/type: tubefeeds DVT prophylaxis: prophylactic heparin  GI prophylaxis: H2B Lines: N/A Foley:  Yes, and it is still needed Code Status:  full code  1/19: Pt's mother updated at bedside. 1/20- family updated  Labs   CBC: Recent Labs  Lab 06/09/22 0421 06/10/22 0417 06/11/22 0421 06/12/22 0451 06/13/22 0339  WBC 15.5* 14.2* 18.0* 16.1* 15.7*  HGB 11.3* 10.9* 11.6* 10.6* 10.9*  HCT 42.2 41.7 45.8 42.1 41.4  MCV 68.2* 69.2* 71.9* 72.7* 73.4*  PLT 273 257 249 250 242     Basic Metabolic Panel: Recent Labs  Lab 06/07/22 0500 06/08/22 0454 06/09/22 0421 06/09/22 0424 06/10/22 0417 06/11/22 0421 06/12/22 0451 06/13/22 0508  NA  --  137  --  139 139 138 136 139  K  --  3.6  --  3.4* 3.3* 4.5 4.4 4.1  CL  --  93*  --  95* 97* 98 94* 94*  CO2  --  33*  --  34* 32 30 33* 35*  GLUCOSE  --  143*  --  121* 88 177* 183* 179*  BUN  --  16  --  '17 18 18 '$ 23* 27*  CREATININE  --  0.55  --  0.63 0.55 0.58 0.62 0.61  CALCIUM  --  8.8*  --  8.7* 8.7* 8.9 8.7* 9.0  MG 2.2 2.4 2.3  --   --  2.4  --  2.6*  PHOS 4.8* 5.1*  --  5.3* 4.8* 4.1 4.0 5.1*    GFR: Estimated Creatinine Clearance: 109 mL/min (by C-G formula based on SCr of 0.61 mg/dL). Recent Labs  Lab 06/07/22 0500 06/08/22 0454 06/09/22 0421 06/10/22 0417 06/11/22 0421 06/12/22 0451 06/13/22 0339  PROCALCITON <0.10 <0.10 <0.10  --   --   --   --   WBC  --  14.2* 15.5* 14.2* 18.0* 16.1* 15.7*     Liver Function Tests: Recent Labs  Lab 06/06/22 1238 06/08/22 0454 06/09/22 0424 06/10/22 0417 06/11/22 0421 06/12/22 0451 06/13/22 0508  AST 16  --   --   --   --   --   --   ALT 15  --   --   --   --   --   --   ALKPHOS 106  --   --   --   --   --   --   BILITOT 0.5  --   --   --   --   --   --   PROT 7.5  --   --   --   --   --   --   ALBUMIN 3.4*   < > 3.0* 3.1* 3.4* 3.1* 3.2*   < > = values in  this interval not displayed.    No results for input(s): "LIPASE", "AMYLASE" in the last 168 hours.  No results for input(s): "AMMONIA" in the last 168 hours.  ABG    Component Value Date/Time   PHART 7.42 06/07/2022 0828   PCO2ART 60 (H) 06/07/2022 0828   PO2ART 68 (L) 06/07/2022 0828   HCO3 38.9 (H) 06/07/2022 0828   O2SAT 95.7 06/07/2022 0828      DVT/GI PRX  assessed I Assessed the need for Labs I Assessed the need for Foley I Assessed the need for Central Venous Line Family Discussion when available I Assessed the need for Mobilization I made an Assessment of medications to be adjusted accordingly Safety Risk assessment completed  CASE DISCUSSED IN MULTIDISCIPLINARY ROUNDS WITH ICU TEAM     Critical Care Time devoted to patient care services described in this note is 55 minutes.  Critical care was necessary to treat /prevent imminent and life-threatening deterioration.   Corrin Parker, M.D.  [facility]  Medical Director [place] Director [facility] Cardio-Pulmonary Department

## 2022-06-14 DIAGNOSIS — J9601 Acute respiratory failure with hypoxia: Secondary | ICD-10-CM | POA: Diagnosis not present

## 2022-06-14 DIAGNOSIS — J9602 Acute respiratory failure with hypercapnia: Secondary | ICD-10-CM | POA: Diagnosis not present

## 2022-06-14 LAB — CBC
HCT: 42.5 % (ref 36.0–46.0)
Hemoglobin: 10.8 g/dL — ABNORMAL LOW (ref 12.0–15.0)
MCH: 18.5 pg — ABNORMAL LOW (ref 26.0–34.0)
MCHC: 25.4 g/dL — ABNORMAL LOW (ref 30.0–36.0)
MCV: 72.6 fL — ABNORMAL LOW (ref 80.0–100.0)
Platelets: 220 10*3/uL (ref 150–400)
RBC: 5.85 MIL/uL — ABNORMAL HIGH (ref 3.87–5.11)
RDW: 22.6 % — ABNORMAL HIGH (ref 11.5–15.5)
WBC: 16.3 10*3/uL — ABNORMAL HIGH (ref 4.0–10.5)
nRBC: 0 % (ref 0.0–0.2)

## 2022-06-14 LAB — GLUCOSE, CAPILLARY
Glucose-Capillary: 163 mg/dL — ABNORMAL HIGH (ref 70–99)
Glucose-Capillary: 169 mg/dL — ABNORMAL HIGH (ref 70–99)
Glucose-Capillary: 150 mg/dL — ABNORMAL HIGH (ref 70–99)
Glucose-Capillary: 167 mg/dL — ABNORMAL HIGH (ref 70–99)
Glucose-Capillary: 163 mg/dL — ABNORMAL HIGH (ref 70–99)
Glucose-Capillary: 138 mg/dL — ABNORMAL HIGH (ref 70–99)

## 2022-06-14 LAB — MAGNESIUM: Magnesium: 2.6 mg/dL — ABNORMAL HIGH (ref 1.7–2.4)

## 2022-06-14 LAB — RENAL FUNCTION PANEL
Albumin: 3.4 g/dL — ABNORMAL LOW (ref 3.5–5.0)
Anion gap: 10 (ref 5–15)
BUN: 24 mg/dL — ABNORMAL HIGH (ref 6–20)
CO2: 35 mmol/L — ABNORMAL HIGH (ref 22–32)
Calcium: 9 mg/dL (ref 8.9–10.3)
Chloride: 95 mmol/L — ABNORMAL LOW (ref 98–111)
Creatinine, Ser: 0.54 mg/dL (ref 0.44–1.00)
GFR, Estimated: >60 mL/min (ref >60)
Glucose, Bld: 172 mg/dL — ABNORMAL HIGH (ref 70–99)
Phosphorus: 4.1 mg/dL (ref 2.5–4.6)
Potassium: 3.7 mmol/L (ref 3.5–5.1)
Sodium: 140 mmol/L (ref 135–145)

## 2022-06-14 LAB — TRIGLYCERIDES: Triglycerides: 190 mg/dL — ABNORMAL HIGH (ref <150)

## 2022-06-14 MED ORDER — MIDAZOLAM BOLUS VIA INFUSION
1.0000 mg | INTRAVENOUS | Status: DC | PRN
  Administered 2022-06-15: 2 mg via INTRAVENOUS

## 2022-06-14 MED ORDER — GLYCOPYRROLATE 0.2 MG/ML IJ SOLN
0.2000 mg | Freq: Three times a day (TID) | INTRAMUSCULAR | Status: DC
  Administered 2022-06-14 – 2022-06-21 (×21): 0.2 mg via INTRAVENOUS
  Filled 2022-06-14 (×20): qty 1

## 2022-06-14 MED ORDER — HYDROXYZINE HCL 25 MG PO TABS
50.0000 mg | ORAL_TABLET | ORAL | Status: DC
  Administered 2022-06-14 – 2022-06-23 (×24): 50 mg
  Filled 2022-06-14 (×25): qty 2

## 2022-06-14 MED ORDER — POTASSIUM CHLORIDE 20 MEQ PO PACK
40.0000 meq | PACK | Freq: Once | ORAL | Status: AC
  Administered 2022-06-14: 40 meq
  Filled 2022-06-14: qty 2

## 2022-06-14 MED ORDER — MIDAZOLAM BOLUS VIA INFUSION
0.0000 mg | INTRAVENOUS | Status: DC | PRN
  Administered 2022-06-14 (×3): 2 mg via INTRAVENOUS
  Administered 2022-06-14: 3 mg via INTRAVENOUS
  Administered 2022-06-14: 1 mg via INTRAVENOUS

## 2022-06-14 MED ORDER — MIDAZOLAM-SODIUM CHLORIDE 100-0.9 MG/100ML-% IV SOLN
0.0000 mg/h | INTRAVENOUS | Status: DC
  Administered 2022-06-14: 2 mg/h via INTRAVENOUS
  Administered 2022-06-15 – 2022-06-16 (×2): 4 mg/h via INTRAVENOUS
  Filled 2022-06-14 (×3): qty 100

## 2022-06-14 MED ORDER — SODIUM CHLORIDE 0.9 % IV SOLN: INTRAVENOUS | Status: DC | PRN

## 2022-06-14 MED ORDER — SENNOSIDES-DOCUSATE SODIUM 8.6-50 MG PO TABS: 2.0000 | ORAL_TABLET | Freq: Every evening | ORAL | Status: DC | PRN

## 2022-06-14 NOTE — Consult Note (Signed)
PHARMACY CONSULT NOTE - ELECTROLYTES  Pharmacy Consult for Electrolyte Monitoring and Replacement   Recent Labs: Potassium (mmol/L)  Date Value  06/14/2022 3.7   Magnesium (mg/dL)  Date Value  06/14/2022 2.6 (H)   Calcium (mg/dL)  Date Value  06/14/2022 9.0   Albumin (g/dL)  Date Value  06/14/2022 3.4 (L)   Phosphorus (mg/dL)  Date Value  06/14/2022 4.1   Sodium (mmol/L)  Date Value  06/14/2022 140   Assessment  Desiree Stone is a 47 y.o. female presenting with AHRF. 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 Pharmacy has been consulted to monitor and replace electrolytes.  Diet/feeding: Vital High Protein 54m/hr, FW [id number] Pertinent medications: Lasix 40 mg IV BID  Goal of Therapy: Electrolytes WNL  Plan:  - KCL [id number] 1 dose given via tube - Will recheck electrolytes with AM labs  Thank you for allowing pharmacy to be a part of this patient's care.  WalPearla Dubonnet24/2024 7:36 AM

## 2022-06-14 NOTE — Progress Notes (Signed)
NAME:  Desiree Stone, MRN:  [id number], DOB:  1976/03/02, LOS: 7 ADMISSION DATE:  06/06/2022, CONSULTATION DATE:  06/07/2022 REFERRING MD:  Dr. Tamala Julian, CHIEF COMPLAINT:  Shortness of breath   Brief Pt Description / Synopsis:  47 year old female admitted with acute hypoxic and hypercapnic respiratory failure in the setting of acute COPD exacerbation, new onset CHF, and suspected community-acquired pneumonia requiring intubation and mechanical ventilation.  History of Present Illness:  47 yo F presenting to [facility] ED from home with complaints of increasing shortness of breath. History provided per chart review, some items confirmed with her mother via phone interview. Patient reported on arrival to increased dyspnea over the past few months, worsening acutely in the last couple of days. She stated she could hardly walk down the hallway at the school and had to stop and rest. She denied outpatient oxygen use and has been unable to use her CPAP as she is unable to sleep in her bed. She instead has been sleeping in her chair for the past several weeks. She endorsed continued cigarette smoking. In addition she reported a productive cough with thick sputum and palpitations, as well as lower extremity edema. She denied difficulty swallowing, increased abdominal distention, or heart failure history. 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 No evidence of pneumonia or pulmonary edema. Bronchitis/reactive airway disease, likely chronic. Borderline cardiomegaly CT soft tissue neck w contrast 06/06/22: no acute finding in the neck CT angio chest PE 06/06/22: no large central PE. The distal pulmonary arteries are not well evaluated due to the patient body habitus. No other evidence of acute cardiopulmonary pathology. Cardiomegaly with enlarged pulmonary artery suggesting pulmonary hypertension and refluxed contrast into the IVC,  suggesting right heart dysfunction. Left adrenal nodule has been present since 2012 consistent with a benign adenoma.   PCCM consulted for admission due to worsening acute respiratory failure suspect multifocal secondary to acute new onset heart failure & suspected COPD in the setting of suspected pulmonary hypertension requiring emergent intubation and mechanical ventilatory support.  Please see "significant hospital events" section below for full detailed hospital course.  Pertinent  Medical History   Past Medical History:  Diagnosis Date   Asthma    Diabetes mellitus without complication ([place])    Hypertension     Micro Data:  1/16: SARS-CoV-2/influenza/RSV PCR>> negative 1/16: Group A strep PCR>> negative 1/17: MRSA PCR>> negative 1/17: HIV screen>> nonreactive 1/18: Blood culture x 2>> no growth to date 1/18: Tracheal aspirate>> gram-positive cocci  Antimicrobials:   Anti-infectives (From admission, onward)    Start     Dose/Rate Route Frequency Ordered Stop   06/13/22 2200  Ampicillin-Sulbactam (UNASYN) 3 g in sodium chloride 0.9 % 100 mL IVPB        3 g 200 mL/hr over 30 Minutes Intravenous Every 6 hours 06/13/22 1243 06/17/22 0359   06/12/22 0500  linezolid (ZYVOX) tablet 600 mg  Status:  Discontinued        600 mg Per Tube Every 12 hours 06/11/22 2301 06/13/22 1243   06/11/22 1400  piperacillin-tazobactam (ZOSYN) IVPB 3.375 g  Status:  Discontinued        3.375 g 12.5 mL/hr over 240 Minutes Intravenous Every 8 hours 06/11/22 1131 06/13/22 1243   06/11/22 1230  linezolid (ZYVOX) tablet 600 mg  Status:  Discontinued        600 mg Per Tube Every 12 hours 06/11/22 1131 06/11/22  2301   06/09/22 1100  cefTRIAXone (ROCEPHIN) 2 g in sodium chloride 0.9 % 100 mL IVPB  Status:  Discontinued        2 g 200 mL/hr over 30 Minutes Intravenous Every 24 hours 06/09/22 1006 06/11/22 1129   06/07/22 1100  azithromycin (ZITHROMAX) tablet 250 mg        250 mg Per Tube Daily 06/07/22  1006 06/11/22 0843       Significant Hospital Events: Including procedures, antibiotic start and stop dates in addition to other pertinent events   06/07/22: Admit to ICU with worsening acute respiratory failure suspect secondary to acute new onset heart failure int he setting of suspected pulmonary hypertension & chronic COPD, OSA, asthma requiring emergent intubation and mechanical ventilatory support.  06/08/22- patient is s/p SBT with severe secretions, she did diurese >800cc today with lasix challenge, CXR with progressive pleural effusions. She has family coming in today. She's uisng OG and weve tranistioned to prednisone taper from solumedrol. Adding aldactone per tube. Iron workup today.  06/09/22-on minimal vent support, will attempt SBT.  Tracheal aspirate growing gram-positive cocci, ceftriaxone added 1/20 extubated and failed, re-intubated 1/21 severe COPD, remains on vent 1/22 severe COPD, remains on vent 1/23 severe upper airway swelling, ENT consulted  Interim History / Subjective:  Severe COPD Morbid obesity Severe resp failure Remains on vent Remains critically ill Likely need trach in setting of morbid obesity and severe COPD/pneumonia   Vent Mode: PRVC FiO2 (%):  [40 %] 40 % Set Rate:  [24 bmp] 24 bmp Vt Set:  [380 mL] 380 mL PEEP:  [8 cmH20] 8 cmH20 Plateau Pressure:  [18 cmH20-23 cmH20] 23 cmH20   Objective   Blood pressure 108/60, pulse 66, temperature 98.4 F (36.9 C), resp. rate (!) 24, height '4\' 11"'$  (1.499 m), weight 135.3 kg, last menstrual period 05/30/2022, SpO2 90 %.    Vent Mode: PRVC FiO2 (%):  [40 %] 40 % Set Rate:  [24 bmp] 24 bmp Vt Set:  [380 mL] 380 mL PEEP:  [8 cmH20] 8 cmH20 Plateau Pressure:  [18 cmH20-23 cmH20] 23 cmH20   Intake/Output Summary (Last 24 hours) at 06/14/2022 0755 Last data filed at 06/14/2022 0700 Gross per 24 hour  Intake 2432.03 ml  Output 2725 ml  Net -292.97 ml    Filed Weights   06/13/22 1301 06/14/22 0500   Weight: 134.9 kg 135.3 kg      REVIEW OF SYSTEMS  PATIENT IS UNABLE TO PROVIDE COMPLETE REVIEW OF SYSTEMS DUE TO SEVERE CRITICAL ILLNESS   PHYSICAL EXAMINATION:  GENERAL:critically ill appearing, +resp distress EYES: Pupils equal, round, reactive to light.  No scleral icterus.  MOUTH: Moist mucosal membrane. INTUBATED NECK: Supple.  PULMONARY: Lungs clear to auscultation, +rhonchi, +wheezing CARDIOVASCULAR: S1 and S2.  Regular rate and rhythm GASTROINTESTINAL: Soft, nontender, -distended. Positive bowel sounds.  MUSCULOSKELETAL: No swelling, clubbing, or edema.  NEUROLOGIC: obtunded,sedated SKIN:normal, warm to touch, Capillary refill delayed  Pulses present bilaterally     Assessment & Plan:   47 yo morbidly obese AAF with Acute hypoxic and hypercapnic respiratory failure in the setting of acute COPD exacerbation, new onset CHF, and community-acquired pneumonia, failure to wean from vent failed trial of extubation and emergently re-intubated PMHx: OSA on CPAP, asthma, tobacco use  Severe ACUTE Hypoxic and Hypercapnic Respiratory Failure -continue Mechanical Ventilator support -Wean Fio2 and PEEP as tolerated -VAP/VENT bundle implementation - Wean PEEP & FiO2 as tolerated, maintain SpO2 > 88% - Head of bed elevated 30  degrees, VAP protocol in place - Plateau pressures less than 30 cm H20  - Intermittent chest x-ray & ABG PRN - Ensure adequate pulmonary hygiene  -will NOT perform SAT/SBT  Patient will need TRACH, ENT consulted   SEVERE COPD EXACERBATION -continue IV steroids as prescribed -continue NEB THERAPY as prescribed    INFECTIOUS DISEASE -continue antibiotics as prescribed -follow up cultures   Elevated Troponin secondary to demand ischemia - Trend troponins  - Echocardiogram ordered - consider systemic anticoagulation as troponin trend suggests - continuous cardiac monitoring    NEUROLOGY -need for sedation -Goal RASS -2 to -3   ENDO - ICU  hypoglycemic\Hyperglycemia protocol -check FSBS per protocol   GI GI PROPHYLAXIS as indicated  NUTRITIONAL STATUS DIET-->TF's as tolerated Constipation protocol as indicated   ELECTROLYTES -follow labs as needed -replace as needed -pharmacy consultation and following   Best Practice (right click and "Reselect all SmartList Selections" daily)   Diet/type: tubefeeds DVT prophylaxis: prophylactic heparin  GI prophylaxis: H2B Lines: N/A Foley:  Yes, and it is still needed Code Status:  full code  1/19: Pt's mother updated at bedside. 1/20- family updated 1/23 mother/sister updated  Labs   CBC: Recent Labs  Lab 06/10/22 0417 06/11/22 0421 06/12/22 0451 06/13/22 0339 06/14/22 0411  WBC 14.2* 18.0* 16.1* 15.7* 16.3*  HGB 10.9* 11.6* 10.6* 10.9* 10.8*  HCT 41.7 45.8 42.1 41.4 42.5  MCV 69.2* 71.9* 72.7* 73.4* 72.6*  PLT 257 249 250 242 220     Basic Metabolic Panel: Recent Labs  Lab 06/08/22 0454 06/09/22 0421 06/09/22 0424 06/10/22 0417 06/11/22 0421 06/12/22 0451 06/13/22 0508 06/14/22 0411  NA 137  --    < > 139 138 136 139 140  K 3.6  --    < > 3.3* 4.5 4.4 4.1 3.7  CL 93*  --    < > 97* 98 94* 94* 95*  CO2 33*  --    < > 32 30 33* 35* 35*  GLUCOSE 143*  --    < > 88 177* 183* 179* 172*  BUN 16  --    < > 18 18 23* 27* 24*  CREATININE 0.55  --    < > 0.55 0.58 0.62 0.61 0.54  CALCIUM 8.8*  --    < > 8.7* 8.9 8.7* 9.0 9.0  MG 2.4 2.3  --   --  2.4  --  2.6* 2.6*  PHOS 5.1*  --    < > 4.8* 4.1 4.0 5.1* 4.1   < > = values in this interval not displayed.    GFR: Estimated Creatinine Clearance: 108.6 mL/min (by C-G formula based on SCr of 0.54 mg/dL). Recent Labs  Lab 06/08/22 0454 06/09/22 0421 06/10/22 0417 06/11/22 0421 06/12/22 0451 06/13/22 0339 06/14/22 0411  PROCALCITON <0.10 <0.10  --   --   --   --   --   WBC 14.2* 15.5*   < > 18.0* 16.1* 15.7* 16.3*   < > = values in this interval not displayed.     Liver Function  Tests: Recent Labs  Lab 06/10/22 0417 06/11/22 0421 06/12/22 0451 06/13/22 0508 06/14/22 0411  ALBUMIN 3.1* 3.4* 3.1* 3.2* 3.4*    No results for input(s): "LIPASE", "AMYLASE" in the last 168 hours. No results for input(s): "AMMONIA" in the last 168 hours.  ABG    Component Value Date/Time   PHART 7.42 06/07/2022 0828   PCO2ART 60 (H) 06/07/2022 0828   PO2ART 68 (L) 06/07/2022  0757   HCO3 38.9 (H) 06/07/2022 0828   O2SAT 95.7 06/07/2022 0828       DVT/GI PRX  assessed I Assessed the need for Labs I Assessed the need for Foley I Assessed the need for Central Venous Line Family Discussion when available I Assessed the need for Mobilization I made an Assessment of medications to be adjusted accordingly Safety Risk assessment completed  CASE DISCUSSED IN MULTIDISCIPLINARY ROUNDS WITH ICU TEAM     Critical Care Time devoted to patient care services described in this note is 45 minutes.  Critical care was necessary to treat /prevent imminent and life-threatening deterioration. Overall, patient is critically ill, prognosis is guarded.  Patient with Multiorgan failure and at high risk for cardiac arrest and death.    Corrin Parker, M.D.  [facility]  Medical Director [place] Director [facility] Cardio-Pulmonary Department

## 2022-06-15 ENCOUNTER — Inpatient Hospital Stay: Payer: Medicaid Other

## 2022-06-15 DIAGNOSIS — J9601 Acute respiratory failure with hypoxia: Secondary | ICD-10-CM | POA: Diagnosis not present

## 2022-06-15 DIAGNOSIS — J9602 Acute respiratory failure with hypercapnia: Secondary | ICD-10-CM | POA: Diagnosis not present

## 2022-06-15 LAB — GLUCOSE, CAPILLARY
Glucose-Capillary: 161 mg/dL — ABNORMAL HIGH (ref 70–99)
Glucose-Capillary: 164 mg/dL — ABNORMAL HIGH (ref 70–99)
Glucose-Capillary: 119 mg/dL — ABNORMAL HIGH (ref 70–99)
Glucose-Capillary: 181 mg/dL — ABNORMAL HIGH (ref 70–99)
Glucose-Capillary: 209 mg/dL — ABNORMAL HIGH (ref 70–99)
Glucose-Capillary: 173 mg/dL — ABNORMAL HIGH (ref 70–99)

## 2022-06-15 LAB — CBC
HCT: 41.1 % (ref 36.0–46.0)
Hemoglobin: 10.4 g/dL — ABNORMAL LOW (ref 12.0–15.0)
MCH: 18.9 pg — ABNORMAL LOW (ref 26.0–34.0)
MCHC: 25.3 g/dL — ABNORMAL LOW (ref 30.0–36.0)
MCV: 74.9 fL — ABNORMAL LOW (ref 80.0–100.0)
Platelets: 234 10*3/uL (ref 150–400)
RBC: 5.49 MIL/uL — ABNORMAL HIGH (ref 3.87–5.11)
RDW: 22.6 % — ABNORMAL HIGH (ref 11.5–15.5)
WBC: 14.2 10*3/uL — ABNORMAL HIGH (ref 4.0–10.5)
nRBC: 0 % (ref 0.0–0.2)

## 2022-06-15 LAB — MAGNESIUM: Magnesium: 2.2 mg/dL (ref 1.7–2.4)

## 2022-06-15 LAB — RENAL FUNCTION PANEL
Albumin: 3.1 g/dL — ABNORMAL LOW (ref 3.5–5.0)
Anion gap: 6 (ref 5–15)
BUN: 29 mg/dL — ABNORMAL HIGH (ref 6–20)
CO2: 36 mmol/L — ABNORMAL HIGH (ref 22–32)
Calcium: 8.5 mg/dL — ABNORMAL LOW (ref 8.9–10.3)
Chloride: 101 mmol/L (ref 98–111)
Creatinine, Ser: 0.45 mg/dL (ref 0.44–1.00)
GFR, Estimated: >60 mL/min (ref >60)
Glucose, Bld: 138 mg/dL — ABNORMAL HIGH (ref 70–99)
Phosphorus: 4.2 mg/dL (ref 2.5–4.6)
Potassium: 3.4 mmol/L — ABNORMAL LOW (ref 3.5–5.1)
Sodium: 143 mmol/L (ref 135–145)

## 2022-06-15 LAB — TRIGLYCERIDES: Triglycerides: 149 mg/dL (ref <150)

## 2022-06-15 MED ORDER — VITAL 1.5 CAL PO LIQD
1000.0000 mL | ORAL | Status: DC
  Administered 2022-06-15 – 2022-06-23 (×13): 1000 mL

## 2022-06-15 MED ORDER — FERROUS SULFATE 325 (65 FE) MG PO TABS
325.0000 mg | ORAL_TABLET | Freq: Every day | ORAL | Status: DC
  Administered 2022-06-16 – 2022-06-18 (×3): 325 mg via ORAL
  Filled 2022-06-15 (×3): qty 1

## 2022-06-15 MED ORDER — FREE WATER
50.0000 mL | Status: DC
  Administered 2022-06-15 – 2022-07-04 (×102): 50 mL

## 2022-06-15 MED ORDER — PROSOURCE TF20 ENFIT COMPATIBL EN LIQD: 60.0000 mL | Freq: Every day | ENTERAL | Status: DC

## 2022-06-15 MED ORDER — POTASSIUM CHLORIDE 20 MEQ PO PACK
40.0000 meq | PACK | Freq: Once | ORAL | Status: AC
  Administered 2022-06-15: 40 meq
  Filled 2022-06-15: qty 2

## 2022-06-15 NOTE — Progress Notes (Addendum)
Nutrition Follow Up Note   DOCUMENTATION CODES:   Morbid obesity  INTERVENTION:   Change to Vital 1.5 '@70ml'$ /hr continuous   Free water flushes 48m q4 hours to maintain tube patency   Regimen provides 2520kcal/day, 114g/day protein and 15822mday of free water.   Daily weights   NUTRITION DIAGNOSIS:   Inadequate oral intake related to inability to eat (pt sedated and ventilated) as evidenced by NPO status. -ongoing   GOAL:   Provide needs based on ASPEN/SCCM guidelines -met   MONITOR:   Vent status, Labs, Weight trends, TF tolerance, I & O's, Skin  ASSESSMENT:   [age]/o female with h/o OSA, HTN, DM, asthma, morbid obesity and uterine fibroid who is admitted with new COPD and CHF.  Pt remains sedated and ventilated. OGT exchanged for NGT in preparation for planned tracheostomy; RD will also adjust estimated needs. Pt has been tolerating tube feeds well. Per chart, pt is weight stable since admission. Pt -5.0L on her I & Os.   Medications reviewed and include: lovenox, pepcid, ferrous sulfate, lasix, robinul,  insulin, solu-medrol, MVI, miralax, unasyn, iron sucrose  Labs reviewed: K 3.4(L), BUN 29(H), P 4.2 wnl, Mg 2.2 wnl Iron 32, TIBC 518(H), ferritin 12- 1/18 Wbc- 14.2(H), Hgb 10.4(L), MCV 74.9(L), MCH 18.9(L), MCHC 25.3(H) Cbgs- 119, 164, 161 x 24 hrs  Patient is currently intubated on ventilator support MV: 10.0 L/min Temp (24hrs), Avg:98.1 F (36.7 C), Min:97.5 F (36.4 C), Max:98.8 F (37.1 C)  Propofol: none   MAP- >6571m   UOP- 2465m3miet Order:   Diet Order             Diet NPO time specified  Diet effective now                  EDUCATION NEEDS:   No education needs have been identified at this time  Skin:  Skin Assessment: Reviewed RN Assessment  Last BM:  1/25- type 7  Height:   Ht Readings from Last 1 Encounters:  06/08/22 '4\' 11"'$  (1.499 m)    Weight:   Wt Readings from Last 1 Encounters:  06/15/22 135 kg    Ideal  Body Weight:  44.5 kg  BMI:  Body mass index is 60.11 kg/m.  Estimated Nutritional Needs:   Kcal:  2500-2800kcal/day  Protein:  >110g/day  Fluid:  1.4-1.6L/day  CaseKoleen Distance RD, LDN Please [facility] and/or RD on-call/weekend/after hours pager

## 2022-06-15 NOTE — Progress Notes (Signed)
NAME:  Desiree Stone, MRN:  [id number], DOB:  12/11/1975, LOS: 8 ADMISSION DATE:  06/06/2022, CONSULTATION DATE:  06/07/2022 REFERRING MD:  Dr. Tamala Julian, CHIEF COMPLAINT:  Shortness of breath   Brief Pt Description / Synopsis:  47 year old female admitted with acute hypoxic and hypercapnic respiratory failure in the setting of acute COPD exacerbation, new onset CHF, and suspected community-acquired pneumonia requiring intubation and mechanical ventilation.  History of Present Illness:  47 yo F presenting to [facility] ED from home with complaints of increasing shortness of breath. History provided per chart review, some items confirmed with her mother via phone interview. Patient reported on arrival to increased dyspnea over the past few months, worsening acutely in the last couple of days. She stated she could hardly walk down the hallway at the school and had to stop and rest. She denied outpatient oxygen use and has been unable to use her CPAP as she is unable to sleep in her bed. She instead has been sleeping in her chair for the past several weeks. She endorsed continued cigarette smoking. In addition she reported a productive cough with thick sputum and palpitations, as well as lower extremity edema. She denied difficulty swallowing, increased abdominal distention, or heart failure history. 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 No evidence of pneumonia or pulmonary edema. Bronchitis/reactive airway disease, likely chronic. Borderline cardiomegaly CT soft tissue neck w contrast 06/06/22: no acute finding in the neck CT angio chest PE 06/06/22: no large central PE. The distal pulmonary arteries are not well evaluated due to the patient body habitus. No other evidence of acute cardiopulmonary pathology. Cardiomegaly with enlarged pulmonary artery suggesting pulmonary hypertension and refluxed contrast into the IVC,  suggesting right heart dysfunction. Left adrenal nodule has been present since 2012 consistent with a benign adenoma.   PCCM consulted for admission due to worsening acute respiratory failure suspect multifocal secondary to acute new onset heart failure & suspected COPD in the setting of suspected pulmonary hypertension requiring emergent intubation and mechanical ventilatory support.  Please see "significant hospital events" section below for full detailed hospital course.  Pertinent  Medical History   Past Medical History:  Diagnosis Date   Asthma    Diabetes mellitus without complication ([place])    Hypertension     Micro Data:  1/16: SARS-CoV-2/influenza/RSV PCR>> negative 1/16: Group A strep PCR>> negative 1/17: MRSA PCR>> negative 1/17: HIV screen>> nonreactive 1/18: Blood culture x 2>> no growth to date 1/18: Tracheal aspirate>> gram-positive cocci  Antimicrobials:   Anti-infectives (From admission, onward)    Start     Dose/Rate Route Frequency Ordered Stop   06/13/22 2200  Ampicillin-Sulbactam (UNASYN) 3 g in sodium chloride 0.9 % 100 mL IVPB        3 g 200 mL/hr over 30 Minutes Intravenous Every 6 hours 06/13/22 1243 06/17/22 0359   06/12/22 0500  linezolid (ZYVOX) tablet 600 mg  Status:  Discontinued        600 mg Per Tube Every 12 hours 06/11/22 2301 06/13/22 1243   06/11/22 1400  piperacillin-tazobactam (ZOSYN) IVPB 3.375 g  Status:  Discontinued        3.375 g 12.5 mL/hr over 240 Minutes Intravenous Every 8 hours 06/11/22 1131 06/13/22 1243   06/11/22 1230  linezolid (ZYVOX) tablet 600 mg  Status:  Discontinued        600 mg Per Tube Every 12 hours 06/11/22 1131 06/11/22  2301   06/09/22 1100  cefTRIAXone (ROCEPHIN) 2 g in sodium chloride 0.9 % 100 mL IVPB  Status:  Discontinued        2 g 200 mL/hr over 30 Minutes Intravenous Every 24 hours 06/09/22 1006 06/11/22 1129   06/07/22 1100  azithromycin (ZITHROMAX) tablet 250 mg        250 mg Per Tube Daily 06/07/22  1006 06/11/22 0843       Significant Hospital Events: Including procedures, antibiotic start and stop dates in addition to other pertinent events   06/07/22: Admit to ICU with worsening acute respiratory failure suspect secondary to acute new onset heart failure int he setting of suspected pulmonary hypertension & chronic COPD, OSA, asthma requiring emergent intubation and mechanical ventilatory support.  06/08/22- patient is s/p SBT with severe secretions, she did diurese >800cc today with lasix challenge, CXR with progressive pleural effusions. She has family coming in today. She's uisng OG and weve tranistioned to prednisone taper from solumedrol. Adding aldactone per tube. Iron workup today.  06/09/22-on minimal vent support, will attempt SBT.  Tracheal aspirate growing gram-positive cocci, ceftriaxone added 1/20 extubated and failed, re-intubated 1/21 severe COPD, remains on vent 1/22 severe COPD, remains on vent 1/23 severe upper airway swelling, ENT consulted 1/24 severe resp failure  Interim History / Subjective:  Severe COPD Morbid obesity Severe resp failure Remains on vent Remains critically ill Needs [facility] ENT CONSULTED   Vent Mode: PRVC FiO2 (%):  [40 %] 40 % Set Rate:  [24 bmp] 24 bmp Vt Set:  [380 mL] 380 mL PEEP:  [8 cmH20] 8 cmH20 Plateau Pressure:  [21 cmH20-22 cmH20] 21 cmH20   Objective   Blood pressure (!) 116/103, pulse 96, temperature 98.8 F (37.1 C), resp. rate 19, height '4\' 11"'$  (1.499 m), weight 135 kg, last menstrual period 05/30/2022, SpO2 (!) 87 %.    Vent Mode: PRVC FiO2 (%):  [40 %] 40 % Set Rate:  [24 bmp] 24 bmp Vt Set:  [380 mL] 380 mL PEEP:  [8 cmH20] 8 cmH20 Plateau Pressure:  [21 cmH20-22 cmH20] 21 cmH20   Intake/Output Summary (Last 24 hours) at 06/15/2022 0735 Last data filed at 06/15/2022 8127 Gross per 24 hour  Intake 2117.32 ml  Output 2730 ml  Net -612.68 ml    Filed Weights   06/13/22 1301 06/14/22 0500 06/15/22 0449   Weight: 134.9 kg 135.3 kg 135 kg       REVIEW OF SYSTEMS  PATIENT IS UNABLE TO PROVIDE COMPLETE REVIEW OF SYSTEMS DUE TO SEVERE CRITICAL ILLNESS   PHYSICAL EXAMINATION:  GENERAL:critically ill appearing, +resp distress EYES: Pupils equal, round, reactive to light.  No scleral icterus.  MOUTH: Moist mucosal membrane. INTUBATED NECK: Supple.  PULMONARY: Lungs clear to auscultation, +rhonchi, +wheezing CARDIOVASCULAR: S1 and S2.  Regular rate and rhythm GASTROINTESTINAL: Soft, nontender, -distended. Positive bowel sounds.  MUSCULOSKELETAL: No swelling, clubbing, or edema.  NEUROLOGIC: obtunded,sedated SKIN:normal, warm to touch, Capillary refill delayed  Pulses present bilaterally      Assessment & Plan:   47 yo morbidly obese AAF with Acute hypoxic and hypercapnic respiratory failure in the setting of acute COPD exacerbation, new onset CHF, and community-acquired pneumonia, failure to wean from vent failed trial of extubation and emergently re-intubated PMHx: OSA on CPAP, asthma, tobacco use  Severe ACUTE Hypoxic and Hypercapnic Respiratory Failure -continue Mechanical Ventilator support -Wean Fio2 and PEEP as tolerated -VAP/VENT bundle implementation - Wean PEEP & FiO2 as tolerated, maintain SpO2 > 88% - Head  of bed elevated 30 degrees, VAP protocol in place - Plateau pressures less than 30 cm H20  - Intermittent chest x-ray & ABG PRN - Ensure adequate pulmonary hygiene  Patient will need TRACH, ENT consulted  SEVERE COPD EXACERBATION -continue IV steroids as prescribed -continue NEB THERAPY as prescribed  INFECTIOUS DISEASE -continue antibiotics as prescribed -follow up cultures   Elevated Troponin secondary to demand ischemia - Trend troponins  - Echocardiogram ordered - consider systemic anticoagulation as troponin trend suggests - continuous cardiac monitoring   NEUROLOGY -need for sedation -Goal RASS -2 to -3'    ENDO - ICU  hypoglycemic\Hyperglycemia protocol -check FSBS per protocol   GI GI PROPHYLAXIS as indicated  NUTRITIONAL STATUS DIET-->TF's as tolerated Constipation protocol as indicated   ELECTROLYTES -follow labs as needed -replace as needed -pharmacy consultation and following   Best Practice (right click and "Reselect all SmartList Selections" daily)   Diet/type: tubefeeds DVT prophylaxis: prophylactic heparin  GI prophylaxis: H2B Lines: N/A Foley:  Yes, and it is still needed Code Status:  full code  1/19: Pt's mother updated at bedside. 1/20- family updated 1/23 mother/sister updated  Labs   CBC: Recent Labs  Lab 06/11/22 0421 06/12/22 0451 06/13/22 0339 06/14/22 0411 06/15/22 0327  WBC 18.0* 16.1* 15.7* 16.3* 14.2*  HGB 11.6* 10.6* 10.9* 10.8* 10.4*  HCT 45.8 42.1 41.4 42.5 41.1  MCV 71.9* 72.7* 73.4* 72.6* 74.9*  PLT 249 250 242 220 234     Basic Metabolic Panel: Recent Labs  Lab 06/09/22 0421 06/09/22 0424 06/11/22 0421 06/12/22 0451 06/13/22 0508 06/14/22 0411 06/15/22 0327  NA  --    < > 138 136 139 140 143  K  --    < > 4.5 4.4 4.1 3.7 3.4*  CL  --    < > 98 94* 94* 95* 101  CO2  --    < > 30 33* 35* 35* 36*  GLUCOSE  --    < > 177* 183* 179* 172* 138*  BUN  --    < > 18 23* 27* 24* 29*  CREATININE  --    < > 0.58 0.62 0.61 0.54 0.45  CALCIUM  --    < > 8.9 8.7* 9.0 9.0 8.5*  MG 2.3  --  2.4  --  2.6* 2.6* 2.2  PHOS  --    < > 4.1 4.0 5.1* 4.1 4.2   < > = values in this interval not displayed.    GFR: Estimated Creatinine Clearance: 108.5 mL/min (by C-G formula based on SCr of 0.45 mg/dL). Recent Labs  Lab 06/09/22 0421 06/10/22 0417 06/12/22 0451 06/13/22 0339 06/14/22 0411 06/15/22 0327  PROCALCITON <0.10  --   --   --   --   --   WBC 15.5*   < > 16.1* 15.7* 16.3* 14.2*   < > = values in this interval not displayed.     Liver Function Tests: Recent Labs  Lab 06/11/22 0421 06/12/22 0451 06/13/22 0508 06/14/22 0411  06/15/22 0327  ALBUMIN 3.4* 3.1* 3.2* 3.4* 3.1*    No results for input(s): "LIPASE", "AMYLASE" in the last 168 hours. No results for input(s): "AMMONIA" in the last 168 hours.  ABG    Component Value Date/Time   PHART 7.42 06/07/2022 0828   PCO2ART 60 (H) 06/07/2022 0828   PO2ART 68 (L) 06/07/2022 0828   HCO3 38.9 (H) 06/07/2022 0828   O2SAT 95.7 06/07/2022 0828       DVT/GI PRX  assessed I Assessed the need for Labs I Assessed the need for Foley I Assessed the need for Central Venous Line Family Discussion when available I Assessed the need for Mobilization I made an Assessment of medications to be adjusted accordingly Safety Risk assessment completed  CASE DISCUSSED IN MULTIDISCIPLINARY ROUNDS WITH ICU TEAM     Critical Care Time devoted to patient care services described in this note is 35 minutes.  Critical care was necessary to treat /prevent imminent and life-threatening deterioration. Overall, patient is critically ill, prognosis is guarded.  Patient with Multiorgan failure and at high risk for cardiac arrest and death.    Corrin Parker, M.D.  [facility]  Medical Director [place] Director [facility] Cardio-Pulmonary Department

## 2022-06-15 NOTE — Consult Note (Signed)
PHARMACY CONSULT NOTE - ELECTROLYTES  Pharmacy Consult for Electrolyte Monitoring and Replacement   Recent Labs: Potassium (mmol/L)  Date Value  06/15/2022 3.4 (L)   Magnesium (mg/dL)  Date Value  06/15/2022 2.2   Calcium (mg/dL)  Date Value  06/15/2022 8.5 (L)   Albumin (g/dL)  Date Value  06/15/2022 3.1 (L)   Phosphorus (mg/dL)  Date Value  06/15/2022 4.2   Sodium (mmol/L)  Date Value  06/15/2022 143   Assessment  Desiree Stone is a 47 y.o. female presenting with AHRF. 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 Pharmacy has been consulted to monitor and replace electrolytes.  Diet/feeding: Vital High Protein 70m/hr, FW 355mq4h Pertinent medications: Lasix 40 mg IV BID  Goal of Therapy: Electrolytes WNL  Plan:  - Order placed by CCNP for KCL 406mx 1 dose given via tube  - Will recheck electrolytes with AM labs  Thank you for allowing pharmacy to be a part of this patient's care.  WalLance CoonNazari 06/15/2022 7:27 AM

## 2022-06-16 ENCOUNTER — Inpatient Hospital Stay: Payer: Medicaid Other

## 2022-06-16 DIAGNOSIS — J9601 Acute respiratory failure with hypoxia: Secondary | ICD-10-CM

## 2022-06-16 LAB — CBC
HCT: 42.7 % (ref 36.0–46.0)
Hemoglobin: 10.7 g/dL — ABNORMAL LOW (ref 12.0–15.0)
MCH: 19.1 pg — ABNORMAL LOW (ref 26.0–34.0)
MCHC: 25.1 g/dL — ABNORMAL LOW (ref 30.0–36.0)
MCV: 76.1 fL — ABNORMAL LOW (ref 80.0–100.0)
Platelets: 260 10*3/uL (ref 150–400)
RBC: 5.61 MIL/uL — ABNORMAL HIGH (ref 3.87–5.11)
RDW: 23.9 % — ABNORMAL HIGH (ref 11.5–15.5)
WBC: 14.5 10*3/uL — ABNORMAL HIGH (ref 4.0–10.5)
nRBC: 0 % (ref 0.0–0.2)

## 2022-06-16 LAB — RENAL FUNCTION PANEL
Albumin: 3.2 g/dL — ABNORMAL LOW (ref 3.5–5.0)
Anion gap: 12 (ref 5–15)
BUN: 30 mg/dL — ABNORMAL HIGH (ref 6–20)
CO2: 30 mmol/L (ref 22–32)
Calcium: 8.5 mg/dL — ABNORMAL LOW (ref 8.9–10.3)
Chloride: 102 mmol/L (ref 98–111)
Creatinine, Ser: 0.61 mg/dL (ref 0.44–1.00)
GFR, Estimated: >60 mL/min (ref >60)
Glucose, Bld: 222 mg/dL — ABNORMAL HIGH (ref 70–99)
Phosphorus: 3.7 mg/dL (ref 2.5–4.6)
Potassium: 3.8 mmol/L (ref 3.5–5.1)
Sodium: 144 mmol/L (ref 135–145)

## 2022-06-16 LAB — CBC WITH DIFFERENTIAL/PLATELET
Abs Immature Granulocytes: 0.11 10*3/uL — ABNORMAL HIGH (ref 0.00–0.07)
Basophils Absolute: 0 10*3/uL (ref 0.0–0.1)
Basophils Relative: 0 %
Eosinophils Absolute: 0 10*3/uL (ref 0.0–0.5)
Eosinophils Relative: 0 %
HCT: 43.3 % (ref 36.0–46.0)
Hemoglobin: 10.5 g/dL — ABNORMAL LOW (ref 12.0–15.0)
Immature Granulocytes: 1 %
Lymphocytes Relative: 10 %
Lymphs Abs: 1.4 10*3/uL (ref 0.7–4.0)
MCH: 18.5 pg — ABNORMAL LOW (ref 26.0–34.0)
MCHC: 24.2 g/dL — ABNORMAL LOW (ref 30.0–36.0)
MCV: 76.4 fL — ABNORMAL LOW (ref 80.0–100.0)
Monocytes Absolute: 1.3 10*3/uL — ABNORMAL HIGH (ref 0.1–1.0)
Monocytes Relative: 10 %
Neutro Abs: 11.3 10*3/uL — ABNORMAL HIGH (ref 1.7–7.7)
Neutrophils Relative %: 79 %
Platelets: 266 10*3/uL (ref 150–400)
RBC: 5.67 MIL/uL — ABNORMAL HIGH (ref 3.87–5.11)
RDW: 24.1 % — ABNORMAL HIGH (ref 11.5–15.5)
Smear Review: NORMAL
WBC: 14.1 10*3/uL — ABNORMAL HIGH (ref 4.0–10.5)
nRBC: 0 % (ref 0.0–0.2)

## 2022-06-16 LAB — GLUCOSE, CAPILLARY
Glucose-Capillary: 144 mg/dL — ABNORMAL HIGH (ref 70–99)
Glucose-Capillary: 184 mg/dL — ABNORMAL HIGH (ref 70–99)
Glucose-Capillary: 202 mg/dL — ABNORMAL HIGH (ref 70–99)
Glucose-Capillary: 214 mg/dL — ABNORMAL HIGH (ref 70–99)
Glucose-Capillary: 247 mg/dL — ABNORMAL HIGH (ref 70–99)

## 2022-06-16 LAB — MAGNESIUM: Magnesium: 2.2 mg/dL (ref 1.7–2.4)

## 2022-06-16 LAB — PROCALCITONIN: Procalcitonin: <0.1 ng/mL

## 2022-06-16 MED ORDER — SODIUM BICARBONATE 650 MG PO TABS
650.0000 mg | ORAL_TABLET | Freq: Once | ORAL | Status: AC
  Administered 2022-06-16: 650 mg
  Filled 2022-06-16: qty 1

## 2022-06-16 MED ORDER — PANCRELIPASE (LIP-PROT-AMYL) 10440-39150 UNITS PO TABS
20880.0000 [IU] | ORAL_TABLET | Freq: Once | ORAL | Status: AC
  Administered 2022-06-16: 20880 [IU]
  Filled 2022-06-16: qty 2

## 2022-06-16 NOTE — Inpatient Diabetes Management (Signed)
Inpatient Diabetes Program Recommendations  AACE/ADA: New Consensus Statement on Inpatient Glycemic Control (2015)  Target Ranges:  Prepandial:   less than 140 mg/dL      Peak postprandial:   less than 180 mg/dL (1-2 hours)      Critically ill patients:  140 - 180 mg/dL    Latest Reference Range & Units 06/14/22 23:19 06/15/22 03:38 06/15/22 07:53 06/15/22 11:07 06/15/22 15:47 06/15/22 19:19  Glucose-Capillary 70 - 99 mg/dL 138 (H) 161 (H) 164 (H) 119 (H) 181 (H) 209 (H)  (H): Data is abnormally high  Latest Reference Range & Units 06/15/22 23:20 06/16/22 03:22 06/16/22 07:19  Glucose-Capillary 70 - 99 mg/dL 173 (H)  9 units Novolog  214 (H)  12 units Novolog  202 (H)  12 units Novolog   (H): Data is abnormally high      Home DM Meds: Victoza 2.4  mg daily       Metformin 1000 mg BID   Current Orders: Novolog Resistant Correction Scale/ SSI (0-20 units) Q4 hours     Novolog 5 units Q4 hours    MD- Note pt Getting Solumedrol 20 mg BID  Tube Feeds running 60cc/hr  Pt having some mild elevations.  Please consider Increasing the Novolog Tube Feed Coverage to 8 units Q4 hours    --Will follow patient during hospitalization--  Wyn Quaker RN, MSN, [place] Diabetes Coordinator Inpatient Glycemic Control Team Team Pager: [phone] (8a-5p)

## 2022-06-16 NOTE — TOC Progression Note (Addendum)
Transition of Care [facility]) - Progression Note    Patient Details  Name: Desiree Stone MRN: [id number] Date of Birth: Jul 31, 1975  [facility] CM/SW Contact  Shelbie Hutching, RN Phone Number: 06/16/2022, 10:44 AM  Clinical Narrative:     Patient is scheduled for a trach on Monday.  RNCM reached out to [facility] with financial counseling department to see if we can get Medicaid started for patient.  Patient does not have any insurance currently.    Expected Discharge Plan:  (TBD) Barriers to Discharge: Continued Medical Work up  Expected Discharge Plan and Services   Discharge Planning Services: CM Consult   Living arrangements for the past 2 months: Single Family Home                                       Social Determinants of Health (SDOH) Interventions SDOH Screenings   Physical Activity: Sufficiently Active (05/23/2017)  Social Connections: Moderately Isolated (05/23/2017)  Stress: No Stress Concern Present (05/23/2017)  Tobacco Use: Medium Risk (06/06/2022)    Readmission Risk Interventions     No data to display

## 2022-06-16 NOTE — Consult Note (Signed)
PHARMACY CONSULT NOTE - ELECTROLYTES  Pharmacy Consult for Electrolyte Monitoring and Replacement   Recent Labs: Potassium (mmol/L)  Date Value  06/16/2022 3.8   Magnesium (mg/dL)  Date Value  06/16/2022 2.2   Calcium (mg/dL)  Date Value  06/16/2022 8.5 (L)   Albumin (g/dL)  Date Value  06/16/2022 3.2 (L)   Phosphorus (mg/dL)  Date Value  06/16/2022 3.7   Sodium (mmol/L)  Date Value  06/16/2022 144   Assessment  Desiree Stone is a 47 y.o. female presenting with AHRF. 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 Pharmacy has been consulted to monitor and replace electrolytes.  Diet/feeding: Vital High Protein 4m/hr, FW 534mq4h Pertinent medications: Lasix 40 mg IV BID  Goal of Therapy: Electrolytes WNL  Plan:  - No replacement currently indicated  - Will recheck electrolytes with AM labs  Thank you for allowing pharmacy to be a part of this patient's care.  WaPearla Dubonnet/26/2024 7:31 AM

## 2022-06-16 NOTE — Progress Notes (Signed)
NAME:  Desiree Stone, MRN:  [id number], DOB:  06-27-1975, LOS: 9 ADMISSION DATE:  06/06/2022, CONSULTATION DATE:  06/07/2022 REFERRING MD:  Dr. Tamala Julian, CHIEF COMPLAINT:  Shortness of breath   Brief Pt Description / Synopsis:  47 year old female admitted with acute hypoxic and hypercapnic respiratory failure in the setting of acute COPD exacerbation, new onset CHF, and suspected community-acquired pneumonia requiring intubation and mechanical ventilation.  History of Present Illness:  47 yo F presenting to [facility] ED from home with complaints of increasing shortness of breath. History provided per chart review, some items confirmed with her mother via phone interview. Patient reported on arrival to increased dyspnea over the past few months, worsening acutely in the last couple of days. She stated she could hardly walk down the hallway at the school and had to stop and rest. She denied outpatient oxygen use and has been unable to use her CPAP as she is unable to sleep in her bed. She instead has been sleeping in her chair for the past several weeks. She endorsed continued cigarette smoking. In addition she reported a productive cough with thick sputum and palpitations, as well as lower extremity edema. She denied difficulty swallowing, increased abdominal distention, or heart failure history. 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 Lab work significant for mild hyponatremia, hyperglycemia, leukocytosis, elevated but flat troponin and mildly elevated BNP. She was negative for COVID, RSV & influenza. Her respiratory status deteriorated and she was requiring 4 L [place]. CTa imaging was negative for PE, but did reveal cardiomegaly and an enlarged pulmonary artery suggestive of pulmonary hypertension. TRH was consulted for admission. The  patient's respiratory status did not improve while waiting for bed placement in the ED, with ABG demonstrating respiratory acidosis. The patient was placed on BIPAP. Follow up ABG's showed persistent respiratory acidosis even with setting adjustments. Eventually the decision was made to urgently intubate the patient and place her on mechanical ventilatory support. Medications given: lasix 40 mg, IV contrast, fentanyl, rocuronium & etomidate, lovenox, duoneb, solu medrol Initial Vitals: 98.4, 24, 98, 136/99 & 90% on 2 L [place] Significant labs: (Labs/ Imaging personally reviewed) I, Domingo Pulse [place], AGACNP-BC, personally viewed and interpreted this ECG. EKG Interpretation: Date: 06/06/22, EKG Time: 12:29, Rate: 104, Rhythm: ST, QRS Axis:  LAD, Intervals: normal, ST/T Wave abnormalities: non specific T wave abnormalities, Narrative Interpretation: ST with LAD and non specific T wave abnormalities Chemistry: Na+: 134, K+: 3.9, BUN/Cr.: 6/ 0.52, Serum CO2/ AG: 31/10, Cl: 93   CXR 06/06/22: no active cardiopulmonary disease. No evidence of pneumonia or pulmonary edema. Bronchitis/reactive airway disease, likely chronic. Borderline cardiomegaly CT soft tissue neck w contrast 06/06/22: no acute finding in the neck CT angio chest PE 06/06/22: no large central PE. The distal pulmonary arteries are not well evaluated due to the patient body habitus. No other evidence of acute cardiopulmonary pathology. Cardiomegaly with enlarged pulmonary artery suggesting pulmonary hypertension and refluxed contrast into the IVC, suggesting right heart dysfunction. Left adrenal nodule has been present since 2012 consistent with a benign adenoma.   PCCM consulted for admission due to worsening acute respiratory failure suspect multifocal secondary to acute new onset heart failure & suspected COPD in the setting of suspected pulmonary hypertension requiring emergent intubation and mechanical ventilatory support.  Please see  "significant  hospital events" section below for full detailed hospital course.  Pertinent  Medical History   Past Medical History:  Diagnosis Date   Asthma    Diabetes mellitus without complication ([place])    Hypertension     Micro Data:  1/16: SARS-CoV-2/influenza/RSV PCR>> negative 1/16: Group A strep PCR>> negative 1/17: MRSA PCR>> negative 1/17: HIV screen>> nonreactive 1/18: Blood culture x 2>> no growth  1/18: Tracheal aspirate>> normal respiratory flora  Antimicrobials:   Anti-infectives (From admission, onward)    Start     Dose/Rate Route Frequency Ordered Stop   06/13/22 2200  Ampicillin-Sulbactam (UNASYN) 3 g in sodium chloride 0.9 % 100 mL IVPB        3 g 200 mL/hr over 30 Minutes Intravenous Every 6 hours 06/13/22 1243 06/17/22 0359   06/12/22 0500  linezolid (ZYVOX) tablet 600 mg  Status:  Discontinued        600 mg Per Tube Every 12 hours 06/11/22 2301 06/13/22 1243   06/11/22 1400  piperacillin-tazobactam (ZOSYN) IVPB 3.375 g  Status:  Discontinued        3.375 g 12.5 mL/hr over 240 Minutes Intravenous Every 8 hours 06/11/22 1131 06/13/22 1243   06/11/22 1230  linezolid (ZYVOX) tablet 600 mg  Status:  Discontinued        600 mg Per Tube Every 12 hours 06/11/22 1131 06/11/22 2301   06/09/22 1100  cefTRIAXone (ROCEPHIN) 2 g in sodium chloride 0.9 % 100 mL IVPB  Status:  Discontinued        2 g 200 mL/hr over 30 Minutes Intravenous Every 24 hours 06/09/22 1006 06/11/22 1129   06/07/22 1100  azithromycin (ZITHROMAX) tablet 250 mg        250 mg Per Tube Daily 06/07/22 1006 06/11/22 0843       Significant Hospital Events: Including procedures, antibiotic start and stop dates in addition to other pertinent events   06/07/22: Admit to ICU with worsening acute respiratory failure suspect secondary to acute new onset heart failure int he setting of suspected pulmonary hypertension & chronic COPD, OSA, asthma requiring emergent intubation and mechanical ventilatory  support.  06/08/22- patient is s/p SBT with severe secretions, she did diurese >800cc today with lasix challenge, CXR with progressive pleural effusions. She has family coming in today. She's uisng OG and weve tranistioned to prednisone taper from solumedrol. Adding aldactone per tube. Iron workup today.  06/09/22-on minimal vent support, will attempt SBT.  Tracheal aspirate growing gram-positive cocci, ceftriaxone added 1/20 extubated and failed, re-intubated 1/21 severe COPD, remains on vent 1/22 severe COPD, remains on vent 1/23 severe upper airway swelling, ENT consulted 1/24 severe resp failure 1/26: Remains on vent, hemodynamically stable, tentative plan for Trach next Monday 1/29.  Interim History / Subjective:  -No significant events noted overnight -Low grade fever overnight (t max 100.6) ~ has resolved this morning -Leukocytosis unchanged at 14.5 from 14.2 ~ procalcitonin today is negative (<0.10) -Hemodynamically stable, no vasopressors -Awaiting Trach placement next week, tentative plan for Monday 1/29 -Renal function remains stable with diuresis, UOP 2L (net - 5.4 L)   Objective   Blood pressure 127/68, pulse (!) 103, temperature (!) 100.6 F (38.1 C), resp. rate (!) 24, height '4\' 11"'$  (1.499 m), weight 135.2 kg, last menstrual period 05/30/2022, SpO2 92 %.    Vent Mode: PRVC FiO2 (%):  [40 %] 40 % Set Rate:  [24 bmp] 24 bmp Vt Set:  [380 mL] 380 mL PEEP:  [8 [place]  Pressure:  [18 cmH20-22 cmH20] 22 cmH20   Intake/Output Summary (Last 24 hours) at 06/16/2022 0940 Last data filed at 06/16/2022 0604 Gross per 24 hour  Intake 2247.36 ml  Output 2385 ml  Net -137.64 ml    Filed Weights   06/14/22 0500 06/15/22 0449 06/16/22 0500  Weight: 135.3 kg 135 kg 135.2 kg    Examination: General: Acute on chronically obese female, laying in bed, intubated and sedated, no acute distress HENT: Atraumatic, normocephalic, neck supple, difficult to assess JVD due to  body habitus Lungs: Distant coarse breath sounds throughout, occasionally overbreathing the vent, even, nonlabored Cardiovascular: Regular rate and rhythm, S1-S2, no murmurs, rubs, gallops Abdomen: Soft, nontender, nondistended, no guarding or rebound tenderness, bowel sounds positive x 4 Extremities: No deformities, normal bulk and tone, trace edema bilateral lower extremities, warm with good peripheral perfusion Neuro: Lightly sedated, will arouse to gentle stimulation and follow commands, pupils PERRLA GU: Foley catheter in place draining yellow urine  Resolved Hospital Problem list     Assessment & Plan:   #Acute hypoxic and hypercapnic respiratory failure in the setting of acute COPD exacerbation, new onset CHF, and suspected community-acquired pneumonia PMHx: OSA on CPAP, asthma, tobacco use -Full vent support, implement lung protective strategies -Plateau pressures less than 30 cm H20 -Wean FiO2 & PEEP as tolerated to maintain O2 sats 88 to 92% -Follow intermittent Chest X-ray & ABG as needed -Spontaneous Breathing Trials when respiratory parameters met and mental status permits -Implement VAP Bundle -Bronchodilators -IV steroids -Antibiotics as above -Diuresis as renal function and blood pressure permits ~ currently on 40 mg Lasix BID -Tentative plan for Trach on Monday 1/29  #Suspected New onset CHF #Elevated Troponin secondary to demand ischemia #Query pulmonary hypertension PMHx: HTN, HLD Patient reports orthopnea, dyspnea on exertion & BLE edema. CTa suggestive of Right heart dysfunction and pulmonary HTN -Echocardiogram 06/07/22: LVEF 60 to 22%, normal diastolic parameters, RV function normal -Continuous cardiac monitoring -Maintain MAP >65 -Vasopressors as needed to maintain MAP goal ~  NOT requiring -Trend HS Troponin until peaked -Diuresis as renal function and blood pressure permits ~ currently on 40 mg Lasix BID  #Suspected community-acquired pneumonia ~  TREATED -Monitor fever curve -Trend WBC's & Procalcitonin -Follow cultures as above -Continue empiric Unasyn pending cultures & sensitivities  #Diabetes Mellitus Type II -CBG's q4h; Target range of 140 to 180 -SSI -Follow ICU Hypo/Hyperglycemia protocol  #Sedation needs in the setting of Mechanical Ventilation -Maintain a RASS goal of 0 to -1 -Propofol as needed to maintain RASS goal -Avoid sedating medications as able -Daily wake up assessment     Best Practice (right click and "Reselect all SmartList Selections" daily)   Diet/type: tubefeeds DVT prophylaxis: prophylactic heparin  GI prophylaxis: H2B Lines: PICC line, and is still needed Foley:  Yes, and it is still needed Code Status:  full code Last date of multidisciplinary goals of care discussion [06/16/2022]  1/26: Will update pt's family when they arrive at bedside.  Labs   CBC: Recent Labs  Lab 06/12/22 0451 06/13/22 0339 06/14/22 0411 06/15/22 0327 06/16/22 0340  WBC 16.1* 15.7* 16.3* 14.2* 14.5*  HGB 10.6* 10.9* 10.8* 10.4* 10.7*  HCT 42.1 41.4 42.5 41.1 42.7  MCV 72.7* 73.4* 72.6* 74.9* 76.1*  PLT 250 242 220 234 260     Basic Metabolic Panel: Recent Labs  Lab 06/11/22 0421 06/12/22 0451 06/13/22 0508 06/14/22 0411 06/15/22 0327 06/16/22 0340  NA 138 136 139 140 143 144  K 4.5 4.4  4.1 3.7 3.4* 3.8  CL 98 94* 94* 95* 101 102  CO2 30 33* 35* 35* 36* 30  GLUCOSE 177* 183* 179* 172* 138* 222*  BUN 18 23* 27* 24* 29* 30*  CREATININE 0.58 0.62 0.61 0.54 0.45 0.61  CALCIUM 8.9 8.7* 9.0 9.0 8.5* 8.5*  MG 2.4  --  2.6* 2.6* 2.2 2.2  PHOS 4.1 4.0 5.1* 4.1 4.2 3.7    GFR: Estimated Creatinine Clearance: 108.6 mL/min (by C-G formula based on SCr of 0.61 mg/dL). Recent Labs  Lab 06/13/22 0339 06/14/22 0411 06/15/22 0327 06/16/22 0340  WBC 15.7* 16.3* 14.2* 14.5*     Liver Function Tests: Recent Labs  Lab 06/12/22 0451 06/13/22 0508 06/14/22 0411 06/15/22 0327 06/16/22 0340   ALBUMIN 3.1* 3.2* 3.4* 3.1* 3.2*    No results for input(s): "LIPASE", "AMYLASE" in the last 168 hours. No results for input(s): "AMMONIA" in the last 168 hours.  ABG    Component Value Date/Time   PHART 7.42 06/07/2022 0828   PCO2ART 60 (H) 06/07/2022 0828   PO2ART 68 (L) 06/07/2022 0828   HCO3 38.9 (H) 06/07/2022 0828   O2SAT 95.7 06/07/2022 0828     Coagulation Profile: Recent Labs  Lab 06/13/22 2119  INR 1.1    Cardiac Enzymes: No results for input(s): "CKTOTAL", "CKMB", "CKMBINDEX", "TROPONINI" in the last 168 hours.  HbA1C: Hgb A1c MFr Bld  Date/Time Value Ref Range Status  06/07/2022 04:54 AM 9.0 (H) 4.8 - 5.6 % Final    Comment:    (NOTE) Pre diabetes:          5.7%-6.4%  Diabetes:              >6.4%  Glycemic control for   <7.0% adults with diabetes     CBG: Recent Labs  Lab 06/15/22 1547 06/15/22 1919 06/15/22 2320 06/16/22 0322 06/16/22 0719  GLUCAP 181* 209* 173* 214* 202*     Review of Systems:   Unable to assess due to intubation and sedation  Past Medical History:  She,  has a past medical history of Asthma, Diabetes mellitus without complication ([place]), and Hypertension.   Surgical History:   Past Surgical History:  Procedure Laterality Date   carpel tunnel     COLONOSCOPY WITH PROPOFOL N/A 12/08/2014   Procedure: COLONOSCOPY WITH PROPOFOL;  Surgeon: Lollie Sails, MD;  Location: [facility] ENDOSCOPY;  Service: Endoscopy;  Laterality: N/A;   ESOPHAGOGASTRODUODENOSCOPY       Social History:   reports that she quit smoking about 5 years ago. Her smoking use included cigarettes. She has never used smokeless tobacco. She reports that she does not drink alcohol and does not use drugs.   Family History:  Her family history includes Breast cancer (age of onset: 81) in her paternal aunt; Cancer in her paternal grandmother.   Allergies Allergies  Allergen Reactions   Abilify [Aripiprazole] Other (See Comments)    Syncope   Effexor  [Venlafaxine] Other (See Comments)    Hair Loss   Wellbutrin [Bupropion] Other (See Comments)    Acne      Home Medications  Prior to Admission medications   Medication Sig Start Date End Date Taking? Authorizing Provider  albuterol (PROVENTIL HFA;VENTOLIN HFA) 108 (90 BASE) MCG/ACT inhaler Inhale into the lungs every 6 (six) hours as needed for wheezing or shortness of breath.   Yes [provider]  atorvastatin (LIPITOR) 40 MG tablet Take 40 mg by mouth at bedtime. 05/06/20  Yes [provider]  cetirizine (ZYRTEC) 10 MG tablet Take 10 mg by mouth daily.   Yes [provider]  clindamycin (CLEOCIN T) 1 % external solution Apply 1 Application topically 2 (two) times daily.   Yes [provider]  cyanocobalamin 1000 MCG tablet Take 1,000 mcg by mouth daily.   Yes [provider]  enalapril (VASOTEC) 20 MG tablet Take 20 mg by mouth daily.   Yes [provider]  famotidine (PEPCID) 20 MG tablet Take 20 mg by mouth 2 (two) times daily.   Yes [provider]  fluticasone (FLOVENT HFA) 110 MCG/ACT inhaler Inhale 2 puffs into the lungs 2 (two) times daily.   Yes [provider]  hydrochlorothiazide (HYDRODIURIL) 25 MG tablet Take 25 mg by mouth daily.   Yes [provider]  hydrOXYzine (ATARAX) 25 MG tablet Take 25 mg by mouth 3 (three) times daily as needed.   Yes [provider]  ipratropium (ATROVENT HFA) 17 MCG/ACT inhaler Inhale 2 puffs into the lungs every 4 (four) hours as needed for wheezing.   Yes [provider]  liraglutide (VICTOZA) 18 MG/3ML SOPN Inject 2.4 mg into the skin daily.   Yes [provider]  metFORMIN (GLUCOPHAGE-XR) 500 MG 24 hr tablet Take 1,000 mg by mouth 2 (two) times daily with a meal.   Yes [provider]     Critical care time: 40 minutes     Darel Hong, AGACNP-BC [facility] epic messenger for cross cover  needs If after hours, please call E-link

## 2022-06-16 NOTE — Progress Notes (Signed)
NG tube clogged. Attempted to open with medication x2 without success.  New tube placed. Patient tolerated well.

## 2022-06-16 NOTE — Progress Notes (Signed)
06/16/2022 10:09 AM  Desiree Stone [id number]  Patient remains intubated and sedated    Temp:  [98.2 F (36.8 C)-100.9 F (38.3 C)] 100.6 F (38.1 C) (01/26 0600) Pulse Rate:  [90-121] 103 (01/26 0600) Resp:  [17-30] 24 (01/26 0600) BP: (100-134)/(51-80) 127/68 (01/26 0600) SpO2:  [85 %-94 %] 92 % (01/26 0700) FiO2 (%):  [40 %] 40 % (01/26 0700) Weight:  [135.2 kg] 135.2 kg (01/26 0500),     Intake/Output Summary (Last 24 hours) at 06/16/2022 1009 Last data filed at 06/16/2022 0604 Gross per 24 hour  Intake 2091.58 ml  Output 2385 ml  Net -293.42 ml    Results for orders placed or performed during the hospital encounter of 06/06/22 (from the past 24 hour(s))  Glucose, capillary     Status: Abnormal   Collection Time: 06/15/22 11:07 AM  Result Value Ref Range   Glucose-Capillary 119 (H) 70 - 99 mg/dL  Glucose, capillary     Status: Abnormal   Collection Time: 06/15/22  3:47 PM  Result Value Ref Range   Glucose-Capillary 181 (H) 70 - 99 mg/dL  Glucose, capillary     Status: Abnormal   Collection Time: 06/15/22  7:19 PM  Result Value Ref Range   Glucose-Capillary 209 (H) 70 - 99 mg/dL  Glucose, capillary     Status: Abnormal   Collection Time: 06/15/22 11:20 PM  Result Value Ref Range   Glucose-Capillary 173 (H) 70 - 99 mg/dL  Glucose, capillary     Status: Abnormal   Collection Time: 06/16/22  3:22 AM  Result Value Ref Range   Glucose-Capillary 214 (H) 70 - 99 mg/dL  CBC     Status: Abnormal   Collection Time: 06/16/22  3:40 AM  Result Value Ref Range   WBC 14.5 (H) 4.0 - 10.5 K/uL   RBC 5.61 (H) 3.87 - 5.11 MIL/uL   Hemoglobin 10.7 (L) 12.0 - 15.0 g/dL   HCT 42.7 36.0 - 46.0 %   MCV 76.1 (L) 80.0 - 100.0 fL   MCH 19.1 (L) 26.0 - 34.0 pg   MCHC 25.1 (L) 30.0 - 36.0 g/dL   RDW 23.9 (H) 11.5 - 15.5 %   Platelets 260 150 - 400 K/uL   nRBC 0.0 0.0 - 0.2 %  Renal function panel     Status: Abnormal   Collection Time: 06/16/22  3:40 AM  Result Value Ref Range    Sodium 144 135 - 145 mmol/L   Potassium 3.8 3.5 - 5.1 mmol/L   Chloride 102 98 - 111 mmol/L   CO2 30 22 - 32 mmol/L   Glucose, Bld 222 (H) 70 - 99 mg/dL   BUN 30 (H) 6 - 20 mg/dL   Creatinine, Ser 0.61 0.44 - 1.00 mg/dL   Calcium 8.5 (L) 8.9 - 10.3 mg/dL   Phosphorus 3.7 2.5 - 4.6 mg/dL   Albumin 3.2 (L) 3.5 - 5.0 g/dL   GFR, Estimated >60 >60 mL/min   Anion gap 12 5 - 15  Magnesium     Status: None   Collection Time: 06/16/22  3:40 AM  Result Value Ref Range   Magnesium 2.2 1.7 - 2.4 mg/dL  Glucose, capillary     Status: Abnormal   Collection Time: 06/16/22  7:19 AM  Result Value Ref Range   Glucose-Capillary 202 (H) 70 - 99 mg/dL    SUBJECTIVE:  Intubated and sedated  OBJECTIVE:  No changes  IMPRESSION:  Failed to extubate  PLAN:  tracheostomy placement next  Monday am.    [name] 06/16/2022, 10:09 AM

## 2022-06-17 ENCOUNTER — Inpatient Hospital Stay: Payer: Medicaid Other

## 2022-06-17 DIAGNOSIS — R0600 Dyspnea, unspecified: Secondary | ICD-10-CM | POA: Diagnosis not present

## 2022-06-17 DIAGNOSIS — J9601 Acute respiratory failure with hypoxia: Secondary | ICD-10-CM | POA: Diagnosis not present

## 2022-06-17 DIAGNOSIS — J9602 Acute respiratory failure with hypercapnia: Secondary | ICD-10-CM | POA: Diagnosis not present

## 2022-06-17 LAB — GLUCOSE, CAPILLARY
Glucose-Capillary: 191 mg/dL — ABNORMAL HIGH (ref 70–99)
Glucose-Capillary: 191 mg/dL — ABNORMAL HIGH (ref 70–99)
Glucose-Capillary: 209 mg/dL — ABNORMAL HIGH (ref 70–99)
Glucose-Capillary: 214 mg/dL — ABNORMAL HIGH (ref 70–99)
Glucose-Capillary: 222 mg/dL — ABNORMAL HIGH (ref 70–99)
Glucose-Capillary: 230 mg/dL — ABNORMAL HIGH (ref 70–99)

## 2022-06-17 LAB — RENAL FUNCTION PANEL
Albumin: 3.1 g/dL — ABNORMAL LOW (ref 3.5–5.0)
Anion gap: 16 — ABNORMAL HIGH (ref 5–15)
BUN: 24 mg/dL — ABNORMAL HIGH (ref 6–20)
CO2: 27 mmol/L (ref 22–32)
Calcium: 8.3 mg/dL — ABNORMAL LOW (ref 8.9–10.3)
Chloride: 97 mmol/L — ABNORMAL LOW (ref 98–111)
Creatinine, Ser: 0.52 mg/dL (ref 0.44–1.00)
GFR, Estimated: >60 mL/min (ref >60)
Glucose, Bld: 206 mg/dL — ABNORMAL HIGH (ref 70–99)
Phosphorus: 3.5 mg/dL (ref 2.5–4.6)
Potassium: 4 mmol/L (ref 3.5–5.1)
Sodium: 140 mmol/L (ref 135–145)

## 2022-06-17 LAB — CBC
HCT: 40.6 % (ref 36.0–46.0)
Hemoglobin: 10.7 g/dL — ABNORMAL LOW (ref 12.0–15.0)
MCH: 20.3 pg — ABNORMAL LOW (ref 26.0–34.0)
MCHC: 26.4 g/dL — ABNORMAL LOW (ref 30.0–36.0)
MCV: 77 fL — ABNORMAL LOW (ref 80.0–100.0)
Platelets: 242 10*3/uL (ref 150–400)
RBC: 5.27 MIL/uL — ABNORMAL HIGH (ref 3.87–5.11)
RDW: 24.2 % — ABNORMAL HIGH (ref 11.5–15.5)
WBC: 12.8 10*3/uL — ABNORMAL HIGH (ref 4.0–10.5)
nRBC: 0 % (ref 0.0–0.2)

## 2022-06-17 LAB — TRIGLYCERIDES
Triglycerides: 1641 mg/dL — ABNORMAL HIGH (ref <150)
Triglycerides: 139 mg/dL (ref <150)

## 2022-06-17 LAB — MAGNESIUM: Magnesium: 2.3 mg/dL (ref 1.7–2.4)

## 2022-06-17 MED ORDER — FUROSEMIDE 10 MG/ML IJ SOLN
40.0000 mg | Freq: Every day | INTRAMUSCULAR | Status: DC
  Administered 2022-06-18 – 2022-06-23 (×6): 40 mg via INTRAVENOUS
  Filled 2022-06-17 (×6): qty 4

## 2022-06-17 NOTE — Progress Notes (Signed)
Uneventful day. Lots of visitors. No prn s given. Remains in NSR

## 2022-06-17 NOTE — Progress Notes (Signed)
Pt's mother updated at bedside.  All questions answered.     Darel Hong, AGACNP-BC [facility] Prefer epic messenger for cross cover needs If after hours, please call E-link

## 2022-06-17 NOTE — Progress Notes (Signed)
NAME:  Desiree Stone, MRN:  [id number], DOB:  04/29/1976, LOS: 60 ADMISSION DATE:  06/06/2022, CONSULTATION DATE:  06/07/2022 REFERRING MD:  Dr. Tamala Julian, CHIEF COMPLAINT:  Shortness of breath   Brief Pt Description / Synopsis:  47 year old female admitted with acute hypoxic and hypercapnic respiratory failure in the setting of acute COPD exacerbation, new onset CHF, and suspected community-acquired pneumonia requiring intubation and mechanical ventilation.  History of Present Illness:  47 yo F presenting to [facility] ED from home with complaints of increasing shortness of breath. History provided per chart review, some items confirmed with her mother via phone interview. Patient reported on arrival to increased dyspnea over the past few months, worsening acutely in the last couple of days. She stated she could hardly walk down the hallway at the school and had to stop and rest. She denied outpatient oxygen use and has been unable to use her CPAP as she is unable to sleep in her bed. She instead has been sleeping in her chair for the past several weeks. She endorsed continued cigarette smoking. In addition she reported a productive cough with thick sputum and palpitations, as well as lower extremity edema. She denied difficulty swallowing, increased abdominal distention, or heart failure history. 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 Lab work significant for mild hyponatremia, hyperglycemia, leukocytosis, elevated but flat troponin and mildly elevated BNP. She was negative for COVID, RSV & influenza. Her respiratory status deteriorated and she was requiring 4 L [place]. CTa imaging was negative for PE, but did reveal cardiomegaly and an enlarged pulmonary artery suggestive of pulmonary hypertension. TRH was consulted for admission. The  patient's respiratory status did not improve while waiting for bed placement in the ED, with ABG demonstrating respiratory acidosis. The patient was placed on BIPAP. Follow up ABG's showed persistent respiratory acidosis even with setting adjustments. Eventually the decision was made to urgently intubate the patient and place her on mechanical ventilatory support. Medications given: lasix 40 mg, IV contrast, fentanyl, rocuronium & etomidate, lovenox, duoneb, solu medrol Initial Vitals: 98.4, 24, 98, 136/99 & 90% on 2 L [place] Significant labs: (Labs/ Imaging personally reviewed) I, Domingo Pulse [place], AGACNP-BC, personally viewed and interpreted this ECG. EKG Interpretation: Date: 06/06/22, EKG Time: 12:29, Rate: 104, Rhythm: ST, QRS Axis:  LAD, Intervals: normal, ST/T Wave abnormalities: non specific T wave abnormalities, Narrative Interpretation: ST with LAD and non specific T wave abnormalities Chemistry: Na+: 134, K+: 3.9, BUN/Cr.: 6/ 0.52, Serum CO2/ AG: 31/10, Cl: 93   CXR 06/06/22: no active cardiopulmonary disease. No evidence of pneumonia or pulmonary edema. Bronchitis/reactive airway disease, likely chronic. Borderline cardiomegaly CT soft tissue neck w contrast 06/06/22: no acute finding in the neck CT angio chest PE 06/06/22: no large central PE. The distal pulmonary arteries are not well evaluated due to the patient body habitus. No other evidence of acute cardiopulmonary pathology. Cardiomegaly with enlarged pulmonary artery suggesting pulmonary hypertension and refluxed contrast into the IVC, suggesting right heart dysfunction. Left adrenal nodule has been present since 2012 consistent with a benign adenoma.   PCCM consulted for admission due to worsening acute respiratory failure suspect multifocal secondary to acute new onset heart failure & suspected COPD in the setting of suspected pulmonary hypertension requiring emergent intubation and mechanical ventilatory support.  Please see  "significant  hospital events" section below for full detailed hospital course.  Pertinent  Medical History   Past Medical History:  Diagnosis Date   Asthma    Diabetes mellitus without complication ([place])    Hypertension     Micro Data:  1/16: SARS-CoV-2/influenza/RSV PCR>> negative 1/16: Group A strep PCR>> negative 1/17: MRSA PCR>> negative 1/17: HIV screen>> nonreactive 1/18: Blood culture x 2>> no growth  1/18: Tracheal aspirate>> normal respiratory flora  Antimicrobials:   Anti-infectives (From admission, onward)    Start     Dose/Rate Route Frequency Ordered Stop   06/13/22 2200  Ampicillin-Sulbactam (UNASYN) 3 g in sodium chloride 0.9 % 100 mL IVPB        3 g 200 mL/hr over 30 Minutes Intravenous Every 6 hours 06/13/22 1243 06/16/22 2150   06/12/22 0500  linezolid (ZYVOX) tablet 600 mg  Status:  Discontinued        600 mg Per Tube Every 12 hours 06/11/22 2301 06/13/22 1243   06/11/22 1400  piperacillin-tazobactam (ZOSYN) IVPB 3.375 g  Status:  Discontinued        3.375 g 12.5 mL/hr over 240 Minutes Intravenous Every 8 hours 06/11/22 1131 06/13/22 1243   06/11/22 1230  linezolid (ZYVOX) tablet 600 mg  Status:  Discontinued        600 mg Per Tube Every 12 hours 06/11/22 1131 06/11/22 2301   06/09/22 1100  cefTRIAXone (ROCEPHIN) 2 g in sodium chloride 0.9 % 100 mL IVPB  Status:  Discontinued        2 g 200 mL/hr over 30 Minutes Intravenous Every 24 hours 06/09/22 1006 06/11/22 1129   06/07/22 1100  azithromycin (ZITHROMAX) tablet 250 mg        250 mg Per Tube Daily 06/07/22 1006 06/11/22 0843       Significant Hospital Events: Including procedures, antibiotic start and stop dates in addition to other pertinent events   06/07/22: Admit to ICU with worsening acute respiratory failure suspect secondary to acute new onset heart failure int he setting of suspected pulmonary hypertension & chronic COPD, OSA, asthma requiring emergent intubation and mechanical ventilatory  support.  06/08/22- patient is s/p SBT with severe secretions, she did diurese >800cc today with lasix challenge, CXR with progressive pleural effusions. She has family coming in today. She's uisng OG and weve tranistioned to prednisone taper from solumedrol. Adding aldactone per tube. Iron workup today.  06/09/22-on minimal vent support, will attempt SBT.  Tracheal aspirate growing gram-positive cocci, ceftriaxone added 1/20 extubated and failed, re-intubated 1/21 severe COPD, remains on vent 1/22 severe COPD, remains on vent 1/23 severe upper airway swelling, ENT consulted 1/24 severe resp failure 1/26: Remains on vent, hemodynamically stable, tentative plan for Trach next Monday 1/29. 1/27: Remains on vent, no acute changes or events, awaiting Trach on Monday. D/C Solumedrol   Interim History / Subjective:  -No significant events noted overnight -Afebrile, hemodynamically stable, no vasopressors -Leukocytosis improved to 12.8 from 14.5  -Awaiting Trach placement next week, tentative plan for Monday 1/29 -Renal function remains stable with diuresis, UOP 2.3 L (net - 5.8 L) -Will stop Solumedrol today (has been on 20 mg BID since 1/22)   Objective   Blood pressure 116/70, pulse 81, temperature 98.2 F (36.8 C), resp. rate (!) 21, height '4\' 11"'$  (1.499 m), weight 123.6 kg, last menstrual period 05/30/2022, SpO2 94 %.    Vent Mode: PRVC FiO2 (%):  [40 %] 40 % Set Rate:  [24 bmp] 24 bmp Vt Set:  [  380 mL] 380 mL PEEP:  [8 cmH20] 8 cmH20 Plateau Pressure:  [21 cmH20-24 cmH20] 21 cmH20   Intake/Output Summary (Last 24 hours) at 06/17/2022 0820 Last data filed at 06/17/2022 1610 Gross per 24 hour  Intake 2266.21 ml  Output 2685 ml  Net -418.79 ml    Filed Weights   06/15/22 0449 06/16/22 0500 06/17/22 0500  Weight: 135 kg 135.2 kg 123.6 kg    Examination: General: Acute on chronically obese female, laying in bed, intubated and sedated, no acute distress HENT: Atraumatic,  normocephalic, neck supple, difficult to assess JVD due to body habitus Lungs: Distant coarse breath sounds throughout, occasionally overbreathing the vent, even, nonlabored Cardiovascular: Regular rate and rhythm, S1-S2, no murmurs, rubs, gallops Abdomen: Soft, nontender, nondistended, no guarding or rebound tenderness, bowel sounds positive x 4 Extremities: No deformities, normal bulk and tone, trace edema bilateral lower extremities, warm with good peripheral perfusion Neuro: Lightly sedated, will arouse to gentle stimulation and follow commands, pupils PERRLA GU: Foley catheter in place draining yellow urine  Resolved Hospital Problem list     Assessment & Plan:   #Acute hypoxic and hypercapnic respiratory failure in the setting of acute COPD exacerbation, new onset CHF, and suspected community-acquired pneumonia PMHx: OSA on CPAP, asthma, tobacco use -Full vent support, implement lung protective strategies -Plateau pressures less than 30 cm H20 -Wean FiO2 & PEEP as tolerated to maintain O2 sats 88 to 92% -Follow intermittent Chest X-ray & ABG as needed -Spontaneous Breathing Trials when respiratory parameters met and mental status permits -Implement VAP Bundle -Bronchodilators -Completed course of IV steroids, will stop solu-medrol 1/27 -Completed course of ABX as above -Diuresis as renal function and blood pressure permits ~ currently on 40 mg Lasix BID -Tentative plan for Trach on Monday 1/29  #Suspected New onset CHF #Elevated Troponin secondary to demand ischemia #Query pulmonary hypertension PMHx: HTN, HLD Patient reports orthopnea, dyspnea on exertion & BLE edema. CTa suggestive of Right heart dysfunction and pulmonary HTN -Echocardiogram 06/07/22: LVEF 60 to 96%, normal diastolic parameters, RV function normal -Continuous cardiac monitoring -Maintain MAP >65 -Vasopressors as needed to maintain MAP goal ~  NOT requiring -Trend HS Troponin until peaked -Diuresis as renal  function and blood pressure permits ~ currently on 40 mg Lasix BID and toleraing  #Suspected community-acquired pneumonia ~ TREATED -Monitor fever curve -Trend WBC's & Procalcitonin -Follow cultures as above -Continue empiric Unasyn pending cultures & sensitivities  #Diabetes Mellitus Type II -CBG's q4h; Target range of 140 to 180 -SSI -Follow ICU Hypo/Hyperglycemia protocol  #Sedation needs in the setting of Mechanical Ventilation -Maintain a RASS goal of 0 to -1 -Propofol as needed to maintain RASS goal -Avoid sedating medications as able -Daily wake up assessment     Best Practice (right click and "Reselect all SmartList Selections" daily)   Diet/type: tubefeeds DVT prophylaxis: Lovenox GI prophylaxis: H2B Lines: PICC line, and is still needed Foley:  Yes, and it is still needed Code Status:  full code Last date of multidisciplinary goals of care discussion [06/17/2022]  1/27: Will update pt's family when they arrive at bedside.  Labs   CBC: Recent Labs  Lab 06/14/22 0411 06/15/22 0327 06/16/22 0339 06/16/22 0340 06/17/22 0313  WBC 16.3* 14.2* 14.1* 14.5* 12.8*  NEUTROABS  --   --  11.3*  --   --   HGB 10.8* 10.4* 10.5* 10.7* 10.7*  HCT 42.5 41.1 43.3 42.7 40.6  MCV 72.6* 74.9* 76.4* 76.1* 77.0*  PLT 220 234 266 260  242     Basic Metabolic Panel: Recent Labs  Lab 06/13/22 0508 06/14/22 0411 06/15/22 0327 06/16/22 0340 06/17/22 0313  NA 139 140 143 144 140  K 4.1 3.7 3.4* 3.8 4.0  CL 94* 95* 101 102 97*  CO2 35* 35* 36* 30 27  GLUCOSE 179* 172* 138* 222* 206*  BUN 27* 24* 29* 30* 24*  CREATININE 0.61 0.54 0.45 0.61 0.52  CALCIUM 9.0 9.0 8.5* 8.5* 8.3*  MG 2.6* 2.6* 2.2 2.2 2.3  PHOS 5.1* 4.1 4.2 3.7 3.5    GFR: Estimated Creatinine Clearance: 102.4 mL/min (by C-G formula based on SCr of 0.52 mg/dL). Recent Labs  Lab 06/15/22 0327 06/16/22 0339 06/16/22 0340 06/17/22 0313  PROCALCITON  --  <0.10  --   --   WBC 14.2* 14.1* 14.5* 12.8*      Liver Function Tests: Recent Labs  Lab 06/13/22 0508 06/14/22 0411 06/15/22 0327 06/16/22 0340 06/17/22 0313  ALBUMIN 3.2* 3.4* 3.1* 3.2* 3.1*    No results for input(s): "LIPASE", "AMYLASE" in the last 168 hours. No results for input(s): "AMMONIA" in the last 168 hours.  ABG    Component Value Date/Time   PHART 7.42 06/07/2022 0828   PCO2ART 60 (H) 06/07/2022 0828   PO2ART 68 (L) 06/07/2022 0828   HCO3 38.9 (H) 06/07/2022 0828   O2SAT 95.7 06/07/2022 0828     Coagulation Profile: Recent Labs  Lab 06/13/22 2119  INR 1.1     Cardiac Enzymes: No results for input(s): "CKTOTAL", "CKMB", "CKMBINDEX", "TROPONINI" in the last 168 hours.  HbA1C: Hgb A1c MFr Bld  Date/Time Value Ref Range Status  06/07/2022 04:54 AM 9.0 (H) 4.8 - 5.6 % Final    Comment:    (NOTE) Pre diabetes:          5.7%-6.4%  Diabetes:              >6.4%  Glycemic control for   <7.0% adults with diabetes     CBG: Recent Labs  Lab 06/16/22 1520 06/16/22 1932 06/16/22 2333 06/17/22 0315 06/17/22 0737  GLUCAP 144* 247* 184* 209* 230*     Review of Systems:   Unable to assess due to intubation and sedation  Past Medical History:  She,  has a past medical history of Asthma, Diabetes mellitus without complication ([place]), and Hypertension.   Surgical History:   Past Surgical History:  Procedure Laterality Date   carpel tunnel     COLONOSCOPY WITH PROPOFOL N/A 12/08/2014   Procedure: COLONOSCOPY WITH PROPOFOL;  Surgeon: Lollie Sails, MD;  Location: [facility] ENDOSCOPY;  Service: Endoscopy;  Laterality: N/A;   ESOPHAGOGASTRODUODENOSCOPY       Social History:   reports that she quit smoking about 5 years ago. Her smoking use included cigarettes. She has never used smokeless tobacco. She reports that she does not drink alcohol and does not use drugs.   Family History:  Her family history includes Breast cancer (age of onset: 1) in her paternal aunt; Cancer in her paternal  grandmother.   Allergies Allergies  Allergen Reactions   Abilify [Aripiprazole] Other (See Comments)    Syncope   Effexor [Venlafaxine] Other (See Comments)    Hair Loss   Wellbutrin [Bupropion] Other (See Comments)    Acne      Home Medications  Prior to Admission medications   Medication Sig Start Date End Date Taking? Authorizing Provider  albuterol (PROVENTIL HFA;VENTOLIN HFA) 108 (90 BASE) MCG/ACT inhaler Inhale into the lungs every 6 (  six) hours as needed for wheezing or shortness of breath.   Yes [provider]  atorvastatin (LIPITOR) 40 MG tablet Take 40 mg by mouth at bedtime. 05/06/20  Yes [provider]  cetirizine (ZYRTEC) 10 MG tablet Take 10 mg by mouth daily.   Yes [provider]  clindamycin (CLEOCIN T) 1 % external solution Apply 1 Application topically 2 (two) times daily.   Yes [provider]  cyanocobalamin 1000 MCG tablet Take 1,000 mcg by mouth daily.   Yes [provider]  enalapril (VASOTEC) 20 MG tablet Take 20 mg by mouth daily.   Yes [provider]  famotidine (PEPCID) 20 MG tablet Take 20 mg by mouth 2 (two) times daily.   Yes [provider]  fluticasone (FLOVENT HFA) 110 MCG/ACT inhaler Inhale 2 puffs into the lungs 2 (two) times daily.   Yes [provider]  hydrochlorothiazide (HYDRODIURIL) 25 MG tablet Take 25 mg by mouth daily.   Yes [provider]  hydrOXYzine (ATARAX) 25 MG tablet Take 25 mg by mouth 3 (three) times daily as needed.   Yes [provider]  ipratropium (ATROVENT HFA) 17 MCG/ACT inhaler Inhale 2 puffs into the lungs every 4 (four) hours as needed for wheezing.   Yes [provider]  liraglutide (VICTOZA) 18 MG/3ML SOPN Inject 2.4 mg into the skin daily.   Yes [provider]  metFORMIN (GLUCOPHAGE-XR) 500 MG 24 hr tablet Take 1,000 mg by mouth 2 (two) times daily with a meal.   Yes [provider]     Critical care  time: 40 minutes     Darel Hong, AGACNP-BC [facility] epic messenger for cross cover needs If after hours, please call E-link

## 2022-06-17 NOTE — Consult Note (Signed)
PHARMACY CONSULT NOTE - ELECTROLYTES  Pharmacy Consult for Electrolyte Monitoring and Replacement   Recent Labs: Potassium (mmol/L)  Date Value  06/17/2022 4.0   Magnesium (mg/dL)  Date Value  06/17/2022 2.3   Calcium (mg/dL)  Date Value  06/17/2022 8.3 (L)   Albumin (g/dL)  Date Value  06/17/2022 3.1 (L)   Phosphorus (mg/dL)  Date Value  06/17/2022 3.5   Sodium (mmol/L)  Date Value  06/17/2022 140   Assessment  Desiree Stone is a 47 y.o. female presenting with AHRF. 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 Pharmacy has been consulted to monitor and replace electrolytes.  Diet/feeding: Vital High Protein 68m/hr, FW [id number]  Pertinent medications: Lasix 40 mg IV BID  Goal of Therapy: Electrolytes WNL  Plan:  - No replacement currently indicated  - Will recheck electrolytes with AM labs  Thank you for allowing pharmacy to be a part of this patient's care.  RoDallie Piles/27/2024 7:16 AM

## 2022-06-18 ENCOUNTER — Inpatient Hospital Stay: Payer: Medicaid Other

## 2022-06-18 DIAGNOSIS — J9602 Acute respiratory failure with hypercapnia: Secondary | ICD-10-CM | POA: Diagnosis not present

## 2022-06-18 DIAGNOSIS — J9601 Acute respiratory failure with hypoxia: Secondary | ICD-10-CM | POA: Diagnosis not present

## 2022-06-18 LAB — BASIC METABOLIC PANEL
Anion gap: 9 (ref 5–15)
BUN: 31 mg/dL — ABNORMAL HIGH (ref 6–20)
CO2: 34 mmol/L — ABNORMAL HIGH (ref 22–32)
Calcium: 9.1 mg/dL (ref 8.9–10.3)
Chloride: 99 mmol/L (ref 98–111)
Creatinine, Ser: 0.57 mg/dL (ref 0.44–1.00)
GFR, Estimated: >60 mL/min (ref >60)
Glucose, Bld: 213 mg/dL — ABNORMAL HIGH (ref 70–99)
Potassium: 3.8 mmol/L (ref 3.5–5.1)
Sodium: 142 mmol/L (ref 135–145)

## 2022-06-18 LAB — GLUCOSE, CAPILLARY
Glucose-Capillary: 208 mg/dL — ABNORMAL HIGH (ref 70–99)
Glucose-Capillary: 193 mg/dL — ABNORMAL HIGH (ref 70–99)
Glucose-Capillary: 204 mg/dL — ABNORMAL HIGH (ref 70–99)
Glucose-Capillary: 253 mg/dL — ABNORMAL HIGH (ref 70–99)
Glucose-Capillary: 251 mg/dL — ABNORMAL HIGH (ref 70–99)
Glucose-Capillary: 181 mg/dL — ABNORMAL HIGH (ref 70–99)
Glucose-Capillary: 240 mg/dL — ABNORMAL HIGH (ref 70–99)

## 2022-06-18 LAB — CBC
HCT: 42.7 % (ref 36.0–46.0)
Hemoglobin: 10.5 g/dL — ABNORMAL LOW (ref 12.0–15.0)
MCH: 19 pg — ABNORMAL LOW (ref 26.0–34.0)
MCHC: 24.6 g/dL — ABNORMAL LOW (ref 30.0–36.0)
MCV: 77.2 fL — ABNORMAL LOW (ref 80.0–100.0)
Platelets: 277 10*3/uL (ref 150–400)
RBC: 5.53 MIL/uL — ABNORMAL HIGH (ref 3.87–5.11)
RDW: 24.9 % — ABNORMAL HIGH (ref 11.5–15.5)
WBC: 15.8 10*3/uL — ABNORMAL HIGH (ref 4.0–10.5)
nRBC: 0 % (ref 0.0–0.2)

## 2022-06-18 LAB — PROTIME-INR
INR: 1.1 (ref 0.8–1.2)
Prothrombin Time: 13.8 seconds (ref 11.4–15.2)

## 2022-06-18 LAB — MAGNESIUM: Magnesium: 2.3 mg/dL (ref 1.7–2.4)

## 2022-06-18 MED ORDER — FERROUS SULFATE 75 (15 FE) MG/ML PO SOLN
325.0000 mg | Freq: Every day | ORAL | Status: DC
  Administered 2022-06-20 – 2022-06-29 (×10): 325 mg
  Filled 2022-06-18 (×3): qty 4.33
  Filled 2022-06-18: qty 4.3
  Filled 2022-06-18 (×3): qty 4.33
  Filled 2022-06-18: qty 4.3
  Filled 2022-06-18 (×2): qty 4.33
  Filled 2022-06-18: qty 4.3

## 2022-06-18 MED ORDER — ENOXAPARIN SODIUM 60 MG/0.6ML IJ SOSY: 0.5000 mg/kg | PREFILLED_SYRINGE | INTRAMUSCULAR | Status: DC

## 2022-06-18 NOTE — Progress Notes (Signed)
NAME:  Desiree Stone, MRN:  [id number], DOB:  1975/11/25, LOS: 78 ADMISSION DATE:  06/06/2022, CONSULTATION DATE:  06/07/2022 REFERRING MD:  Dr. Tamala Julian, CHIEF COMPLAINT:  Shortness of breath   Brief Pt Description / Synopsis:  47 year old female admitted with acute hypoxic and hypercapnic respiratory failure in the setting of acute COPD exacerbation, new onset CHF, and suspected community-acquired pneumonia requiring intubation and mechanical ventilation.  History of Present Illness:  47 yo F presenting to [facility] ED from home with complaints of increasing shortness of breath. History provided per chart review, some items confirmed with her mother via phone interview. Patient reported on arrival to increased dyspnea over the past few months, worsening acutely in the last couple of days. She stated she could hardly walk down the hallway at the school and had to stop and rest. She denied outpatient oxygen use and has been unable to use her CPAP as she is unable to sleep in her bed. She instead has been sleeping in her chair for the past several weeks. She endorsed continued cigarette smoking. In addition she reported a productive cough with thick sputum and palpitations, as well as lower extremity edema. She denied difficulty swallowing, increased abdominal distention, or heart failure history. 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 Lab work significant for mild hyponatremia, hyperglycemia, leukocytosis, elevated but flat troponin and mildly elevated BNP. She was negative for COVID, RSV & influenza. Her respiratory status deteriorated and she was requiring 4 L [place]. CTa imaging was negative for PE, but did reveal cardiomegaly and an enlarged pulmonary artery suggestive of pulmonary hypertension. TRH was consulted for admission. The  patient's respiratory status did not improve while waiting for bed placement in the ED, with ABG demonstrating respiratory acidosis. The patient was placed on BIPAP. Follow up ABG's showed persistent respiratory acidosis even with setting adjustments. Eventually the decision was made to urgently intubate the patient and place her on mechanical ventilatory support. Medications given: lasix 40 mg, IV contrast, fentanyl, rocuronium & etomidate, lovenox, duoneb, solu medrol Initial Vitals: 98.4, 24, 98, 136/99 & 90% on 2 L [place] Significant labs: (Labs/ Imaging personally reviewed) I, Domingo Pulse Rust[place]ster, AGACNP-BC, personally viewed and interpreted this ECG. EKG Interpretation: Date: 06/06/22, EKG Time: 12:29, Rate: 104, Rhythm: ST, QRS Axis:  LAD, Intervals: normal, ST/T Wave abnormalities: non specific T wave abnormalities, Narrative Interpretation: ST with LAD and non specific T wave abnormalities Chemistry: Na+: 134, K+: 3.9, BUN/Cr.: 6/ 0.52, Serum CO2/ AG: 31/10, Cl: 93   CXR 06/06/22: no active cardiopulmonary disease. No evidence of pneumonia or pulmonary edema. Bronchitis/reactive airway disease, likely chronic. Borderline cardiomegaly CT soft tissue neck w contrast 06/06/22: no acute finding in the neck CT angio chest PE 06/06/22: no large central PE. The distal pulmonary arteries are not well evaluated due to the patient body habitus. No other evidence of acute cardiopulmonary pathology. Cardiomegaly with enlarged pulmonary artery suggesting pulmonary hypertension and refluxed contrast into the IVC, suggesting right heart dysfunction. Left adrenal nodule has been present since 2012 consistent with a benign adenoma.   PCCM consulted for admission due to worsening acute respiratory failure suspect multifocal secondary to acute new onset heart failure & suspected COPD in the setting of suspected pulmonary hypertension requiring emergent intubation and mechanical ventilatory support.  Please see  "significant  hospital events" section below for full detailed hospital course.  Pertinent  Medical History   Past Medical History:  Diagnosis Date   Asthma    Diabetes mellitus without complication ([place])    Hypertension     Micro Data:  1/16: SARS-CoV-2/influenza/RSV PCR>> negative 1/16: Group A strep PCR>> negative 1/17: MRSA PCR>> negative 1/17: HIV screen>> nonreactive 1/18: Blood culture x 2>> no growth  1/18: Tracheal aspirate>> normal respiratory flora  Antimicrobials:   Anti-infectives (From admission, onward)    Start     Dose/Rate Route Frequency Ordered Stop   06/13/22 2200  Ampicillin-Sulbactam (UNASYN) 3 g in sodium chloride 0.9 % 100 mL IVPB        3 g 200 mL/hr over 30 Minutes Intravenous Every 6 hours 06/13/22 1243 06/16/22 2150   06/12/22 0500  linezolid (ZYVOX) tablet 600 mg  Status:  Discontinued        600 mg Per Tube Every 12 hours 06/11/22 2301 06/13/22 1243   06/11/22 1400  piperacillin-tazobactam (ZOSYN) IVPB 3.375 g  Status:  Discontinued        3.375 g 12.5 mL/hr over 240 Minutes Intravenous Every 8 hours 06/11/22 1131 06/13/22 1243   06/11/22 1230  linezolid (ZYVOX) tablet 600 mg  Status:  Discontinued        600 mg Per Tube Every 12 hours 06/11/22 1131 06/11/22 2301   06/09/22 1100  cefTRIAXone (ROCEPHIN) 2 g in sodium chloride 0.9 % 100 mL IVPB  Status:  Discontinued        2 g 200 mL/hr over 30 Minutes Intravenous Every 24 hours 06/09/22 1006 06/11/22 1129   06/07/22 1100  azithromycin (ZITHROMAX) tablet 250 mg        250 mg Per Tube Daily 06/07/22 1006 06/11/22 0843       Significant Hospital Events: Including procedures, antibiotic start and stop dates in addition to other pertinent events   06/07/22: Admit to ICU with worsening acute respiratory failure suspect secondary to acute new onset heart failure int he setting of suspected pulmonary hypertension & chronic COPD, OSA, asthma requiring emergent intubation and mechanical ventilatory  support.  06/08/22- patient is s/p SBT with severe secretions, she did diurese >800cc today with lasix challenge, CXR with progressive pleural effusions. She has family coming in today. She's uisng OG and weve tranistioned to prednisone taper from solumedrol. Adding aldactone per tube. Iron workup today.  06/09/22-on minimal vent support, will attempt SBT.  Tracheal aspirate growing gram-positive cocci, ceftriaxone added 1/20 extubated and failed, re-intubated 1/21 severe COPD, remains on vent 1/22 severe COPD, remains on vent 1/23 severe upper airway swelling, ENT consulted 1/24 severe resp failure 1/26: Remains on vent, hemodynamically stable, tentative plan for Trach next Monday 1/29. 1/27: Remains on vent, no acute changes or events, awaiting Trach on Monday. D/C Solumedrol  1/28: Remains on vent, no acute changes.  BP soft, Lasix decreased to 40 mg daily. Low grade temperature overnight (T max 100), Obtain CT Chest  Interim History / Subjective:  -No significant events noted overnight -Low grade temperature overnight (T max 100), Leukocytosis increased at 15.8 from 12.8 -Discussed with Dr. Gwendalyn Ege ~ will obtain CT Chest -Hemodynamically stable, no vasopressors -Awaiting Trach placement next week, tentative plan for Monday 1/29 -Renal function remains stable with diuresis, UOP 1.4 L (net -2.8 L) ~ BP is soft, will decreased Lasix to 40 mg daily dosing   Objective   Blood pressure (!) 99/59, pulse 92, temperature 100 F (37.8 C), resp. rate [name])  24, height '4\' 11"'$  (1.499 m), weight 124.6 kg, last menstrual period 05/30/2022, SpO2 92 %.    Vent Mode: PRVC FiO2 (%):  [40 %] 40 % Set Rate:  [24 bmp] 24 bmp Vt Set:  [380 mL] 380 mL PEEP:  [8 cmH20] 8 cmH20 Plateau Pressure:  [17 cmH20-22 cmH20] 22 cmH20   Intake/Output Summary (Last 24 hours) at 06/18/2022 1638 Last data filed at 06/18/2022 0600 Gross per 24 hour  Intake 4841.36 ml  Output 1905 ml  Net 2936.36 ml    Filed Weights    06/16/22 0500 06/17/22 0500 06/18/22 0431  Weight: 135.2 kg 123.6 kg 124.6 kg    Examination: General: Acute on chronically obese female, laying in bed, intubated and sedated, no acute distress HENT: Atraumatic, normocephalic, neck supple, difficult to assess JVD due to body habitus Lungs: Distant coarse breath sounds throughout, synchrounous the vent, even, nonlabored Cardiovascular: Regular rate and rhythm, S1-S2, no murmurs, rubs, gallops Abdomen: Soft, nontender, nondistended, no guarding or rebound tenderness, bowel sounds positive x 4 Extremities: No deformities, normal bulk and tone, trace edema bilateral lower extremities, warm with good peripheral perfusion Neuro: Lightly sedated, will arouse to gentle stimulation and follow commands, pupils PERRLA GU: Foley catheter in place draining yellow urine  Resolved Hospital Problem list     Assessment & Plan:   #Acute hypoxic and hypercapnic respiratory failure in the setting of acute COPD exacerbation, new onset CHF, and suspected community-acquired pneumonia PMHx: OSA on CPAP, asthma, tobacco use -Full vent support, implement lung protective strategies -Plateau pressures less than 30 cm H20 -Wean FiO2 & PEEP as tolerated to maintain O2 sats 88 to 92% -Follow intermittent Chest X-ray & ABG as needed -Spontaneous Breathing Trials when respiratory parameters met and mental status permits -Implement VAP Bundle -Bronchodilators -Completed course of IV steroids (solu-medrol discontinued 1/27) -Completed course of ABX as above -Diuresis as renal function and blood pressure permits ~ currently on 40 mg Lasix daily -Tentative plan for Trach on Monday 1/29  #Suspected New onset CHF #Elevated Troponin secondary to demand ischemia #Query pulmonary hypertension PMHx: HTN, HLD Patient reports orthopnea, dyspnea on exertion & BLE edema. CTa suggestive of Right heart dysfunction and pulmonary HTN -Echocardiogram 06/07/22: LVEF 60 to 46%,  normal diastolic parameters, RV function normal -Continuous cardiac monitoring -Maintain MAP >65 -Vasopressors as needed to maintain MAP goal ~  NOT requiring -Trend HS Troponin until peaked -Diuresis as renal function and blood pressure permits ~ decrease Lasix to 40 mg daily dosing given soft BP  #Suspected community-acquired pneumonia ~ TREATED -Monitor fever curve -Trend WBC's & Procalcitonin -Follow cultures as above -Completed course of Azithromycin/Rocephin/Unasyn as outlined above  #Diabetes Mellitus Type II -CBG's q4h; Target range of 140 to 180 -SSI -Follow ICU Hypo/Hyperglycemia protocol  #Sedation needs in the setting of Mechanical Ventilation -Maintain a RASS goal of 0 to -1 -Propofol as needed to maintain RASS goal -Avoid sedating medications as able -Daily wake up assessment     Best Practice (right click and "Reselect all SmartList Selections" daily)   Diet/type: tubefeeds DVT prophylaxis: Lovenox GI prophylaxis: H2B Lines: PICC line, and is still needed Foley:  Yes, and it is still needed Code Status:  full code Last date of multidisciplinary goals of care discussion [06/18/2022]  1/28: Will update pt's family when they arrive at bedside.  Labs   CBC: Recent Labs  Lab 06/15/22 0327 06/16/22 0339 06/16/22 0340 06/17/22 0313 06/18/22 0359  WBC 14.2* 14.1* 14.5* 12.8* 15.8*  NEUTROABS  --  11.3*  --   --   --   HGB 10.4* 10.5* 10.7* 10.7* 10.5*  HCT 41.1 43.3 42.7 40.6 42.7  MCV 74.9* 76.4* 76.1* 77.0* 77.2*  PLT 234 266 260 242 277     Basic Metabolic Panel: Recent Labs  Lab 06/13/22 0508 06/14/22 0411 06/15/22 0327 06/16/22 0340 06/17/22 0313 06/18/22 0359  NA 139 140 143 144 140 142  K 4.1 3.7 3.4* 3.8 4.0 3.8  CL 94* 95* 101 102 97* 99  CO2 35* 35* 36* 30 27 34*  GLUCOSE 179* 172* 138* 222* 206* 213*  BUN 27* 24* 29* 30* 24* 31*  CREATININE 0.61 0.54 0.45 0.61 0.52 0.57  CALCIUM 9.0 9.0 8.5* 8.5* 8.3* 9.1  MG 2.6* 2.6* 2.2 2.2  2.3 2.3  PHOS 5.1* 4.1 4.2 3.7 3.5  --     GFR: Estimated Creatinine Clearance: 102.9 mL/min (by C-G formula based on SCr of 0.57 mg/dL). Recent Labs  Lab 06/16/22 0339 06/16/22 0340 06/17/22 0313 06/18/22 0359  PROCALCITON <0.10  --   --   --   WBC 14.1* 14.5* 12.8* 15.8*     Liver Function Tests: Recent Labs  Lab 06/13/22 0508 06/14/22 0411 06/15/22 0327 06/16/22 0340 06/17/22 0313  ALBUMIN 3.2* 3.4* 3.1* 3.2* 3.1*    No results for input(s): "LIPASE", "AMYLASE" in the last 168 hours. No results for input(s): "AMMONIA" in the last 168 hours.  ABG    Component Value Date/Time   PHART 7.42 06/07/2022 0828   PCO2ART 60 (H) 06/07/2022 0828   PO2ART 68 (L) 06/07/2022 0828   HCO3 38.9 (H) 06/07/2022 0828   O2SAT 95.7 06/07/2022 0828     Coagulation Profile: Recent Labs  Lab 06/13/22 2119 06/18/22 0359  INR 1.1 1.1     Cardiac Enzymes: No results for input(s): "CKTOTAL", "CKMB", "CKMBINDEX", "TROPONINI" in the last 168 hours.  HbA1C: Hgb A1c MFr Bld  Date/Time Value Ref Range Status  06/07/2022 04:54 AM 9.0 (H) 4.8 - 5.6 % Final    Comment:    (NOTE) Pre diabetes:          5.7%-6.4%  Diabetes:              >6.4%  Glycemic control for   <7.0% adults with diabetes     CBG: Recent Labs  Lab 06/17/22 2003 06/17/22 2341 06/18/22 0406 06/18/22 0726 06/18/22 0748  GLUCAP 222* 191* 208* 193* 204*     Review of Systems:   Unable to assess due to intubation and sedation  Past Medical History:  She,  has a past medical history of Asthma, Diabetes mellitus without complication ([place]), and Hypertension.   Surgical History:   Past Surgical History:  Procedure Laterality Date   carpel tunnel     COLONOSCOPY WITH PROPOFOL N/A 12/08/2014   Procedure: COLONOSCOPY WITH PROPOFOL;  Surgeon: Lollie Sails, MD;  [facility] ENDOSCOPY;  Service: Endoscopy;  Laterality: N/A;   ESOPHAGOGASTRODUODENOSCOPY       Social History:   reports that she  quit smoking about 5 years ago. Her smoking use included cigarettes. She has never used smokeless tobacco. She reports that she does not drink alcohol and does not use drugs.   Family History:  Her family history includes Breast cancer (age of onset: 13) in her paternal aunt; Cancer in her paternal grandmother.   Allergies Allergies  Allergen Reactions   Abilify [Aripiprazole] Other (See Comments)    Syncope   Effexor [Venlafaxine] Other (See Comments)  Hair Loss   Wellbutrin [Bupropion] Other (See Comments)    Acne      Home Medications  Prior to Admission medications   Medication Sig Start Date End Date Taking? Authorizing Provider  albuterol (PROVENTIL HFA;VENTOLIN HFA) 108 (90 BASE) MCG/ACT inhaler Inhale into the lungs every 6 (six) hours as needed for wheezing or shortness of breath.   Yes [provider]  atorvastatin (LIPITOR) 40 MG tablet Take 40 mg by mouth at bedtime. 05/06/20  Yes [provider]  cetirizine (ZYRTEC) 10 MG tablet Take 10 mg by mouth daily.   Yes [provider]  clindamycin (CLEOCIN T) 1 % external solution Apply 1 Application topically 2 (two) times daily.   Yes [provider]  cyanocobalamin 1000 MCG tablet Take 1,000 mcg by mouth daily.   Yes [provider]  enalapril (VASOTEC) 20 MG tablet Take 20 mg by mouth daily.   Yes [provider]  famotidine (PEPCID) 20 MG tablet Take 20 mg by mouth 2 (two) times daily.   Yes [provider]  fluticasone (FLOVENT HFA) 110 MCG/ACT inhaler Inhale 2 puffs into the lungs 2 (two) times daily.   Yes [provider]  hydrochlorothiazide (HYDRODIURIL) 25 MG tablet Take 25 mg by mouth daily.   Yes [provider]  hydrOXYzine (ATARAX) 25 MG tablet Take 25 mg by mouth 3 (three) times daily as needed.   Yes [provider]  ipratropium (ATROVENT HFA) 17 MCG/ACT inhaler Inhale 2 puffs into the lungs every 4 (four) hours as needed  for wheezing.   Yes [provider]  liraglutide (VICTOZA) 18 MG/3ML SOPN Inject 2.4 mg into the skin daily.   Yes [provider]  metFORMIN (GLUCOPHAGE-XR) 500 MG 24 hr tablet Take 1,000 mg by mouth 2 (two) times daily with a meal.   Yes [provider]     Critical care time: 40 minutes     Darel Hong, AGACNP-BC [facility] epic messenger for cross cover needs If after hours, please call E-link

## 2022-06-18 NOTE — Progress Notes (Signed)
1614 Down to CT for CT of the chest. Tolerated trip fairly well.

## 2022-06-18 NOTE — Anesthesia Preprocedure Evaluation (Addendum)
Anesthesia Evaluation  Patient identified by MRN, date of birth, ID band Patient unresponsive    Reviewed: Allergy & Precautions, NPO status , Patient's Chart, lab work & pertinent test results  History of Anesthesia Complications Negative for: history of anesthetic complications  Airway Mallampati: Intubated  TM Distance: >3 FB     Dental  (+) Chipped   Pulmonary shortness of breath, asthma , sleep apnea , pneumonia, unresolved, COPD, former smoker    + decreased breath sounds      Cardiovascular hypertension, +CHF  Normal cardiovascular exam     Neuro/Psych negative neurological ROS  negative psych ROS   GI/Hepatic negative GI ROS, Neg liver ROS,,,  Endo/Other  diabetes, Type 2    Renal/GU      Musculoskeletal   Abdominal   Peds  Hematology negative hematology ROS (+)   Anesthesia Other Findings Past Medical History: No date: Asthma No date: Diabetes mellitus without complication (H[facility]) No date: Hypertension  Past Surgical History: No date: carpel tunnel 12/08/2014: COLONOSCOPY WITH PROPOFOL; N/A     Comment:  Procedure: COLONOSCOPY WITH PROPOFOL;  Surgeon: Lollie Sails, MD;  Location[facility] ENDOSCOPY;  Service:               Endoscopy;  Laterality: N/A; No date: ESOPHAGOGASTRODUODENOSCOPY  BMI    Body Mass Index: 55.48 kg/m      Reproductive/Obstetrics negative OB ROS                             Anesthesia Physical Anesthesia Plan  ASA: 4  Anesthesia Plan: General ETT   Post-op Pain Management:    Induction: Intravenous  PONV Risk Score and Plan: Ondansetron, Dexamethasone, Midazolam and Treatment may vary due to age or medical condition  Airway Management Planned: Oral ETT  Additional Equipment:   Intra-op Plan:   Post-operative Plan: Post-operative intubation/ventilation  Informed Consent: I have reviewed the patients History and Physical,  chart, labs and discussed the procedure including the risks, benefits and alternatives for the proposed anesthesia with the patient or authorized representative who has indicated his/her understanding and acceptance.     Dental Advisory Given  Plan Discussed with: Anesthesiologist, CRNA and Surgeon  Anesthesia Plan Comments: (Mother consented for risks of anesthesia including but not limited to:  - adverse reactions to medications - damage to eyes, teeth, lips or other oral mucosa - nerve damage due to positioning  - sore throat or hoarseness - Damage to heart, brain, nerves, lungs, other parts of body or loss of life  She voiced understanding.)       Anesthesia Quick Evaluation

## 2022-06-18 NOTE — Consult Note (Signed)
PHARMACY CONSULT NOTE - ELECTROLYTES  Pharmacy Consult for Electrolyte Monitoring and Replacement   Recent Labs: Potassium (mmol/L)  Date Value  06/18/2022 3.8   Magnesium (mg/dL)  Date Value  06/18/2022 2.3   Calcium (mg/dL)  Date Value  06/18/2022 9.1   Albumin (g/dL)  Date Value  06/17/2022 3.1 (L)   Phosphorus (mg/dL)  Date Value  06/17/2022 3.5   Sodium (mmol/L)  Date Value  06/18/2022 142   Assessment  Desiree Stone is a 47 y.o. female presenting with AHRF. 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 Pharmacy has been consulted to monitor and replace electrolytes. -new onset CHF -1/28 remains on vent-awaiting trach monday  Diet/feeding: Tube Feeds-Vital High Protein 38m/hr, FW [id number]  Pertinent medications: Lasix 40 mg IV BID-decreased to Daily on 1/28   Goal of Therapy: Electrolytes WNL  Plan:  - No replacement currently indicated  - Will recheck electrolytes with AM labs  Thank you for allowing pharmacy to be a part of this patient's care.  Natalija Mavis A 06/18/2022 8:35 AM

## 2022-06-18 NOTE — Plan of Care (Signed)
Discussed plan of care for the evening in front of the patient, pain management and promoting rest with no evidence of learning at this time.  Patient is currently ventilated and on sedation drips.   No wake up assessment per report and MD.  Problem: Education: Goal: Ability to describe self-care measures that may prevent or decrease complications (Diabetes Survival Skills Education) will improve Outcome: Not Progressing

## 2022-06-18 NOTE — Progress Notes (Signed)
RT assisted with patient transport from ICU to CT and back with no complications. Patient transport while on trilogy vent.

## 2022-06-19 ENCOUNTER — Inpatient Hospital Stay: Payer: Medicaid Other | Admitting: Certified Registered"

## 2022-06-19 ENCOUNTER — Encounter
Admission: EM | Disposition: A | Discharge status: Rehabilitation Facility | Payer: Self-pay | Source: Home / Self Care | Attending: Internal Medicine

## 2022-06-19 ENCOUNTER — Other Ambulatory Visit: Payer: Self-pay

## 2022-06-19 DIAGNOSIS — J9601 Acute respiratory failure with hypoxia: Secondary | ICD-10-CM | POA: Diagnosis not present

## 2022-06-19 DIAGNOSIS — J9602 Acute respiratory failure with hypercapnia: Secondary | ICD-10-CM | POA: Diagnosis not present

## 2022-06-19 HISTORY — PX: TRACHEOSTOMY TUBE PLACEMENT: SHX814

## 2022-06-19 LAB — MAGNESIUM: Magnesium: 2.3 mg/dL (ref 1.7–2.4)

## 2022-06-19 LAB — CBC
HCT: 37.8 % (ref 36.0–46.0)
Hemoglobin: 9.8 g/dL — ABNORMAL LOW (ref 12.0–15.0)
MCH: 20.3 pg — ABNORMAL LOW (ref 26.0–34.0)
MCHC: 25.9 g/dL — ABNORMAL LOW (ref 30.0–36.0)
MCV: 78.4 fL — ABNORMAL LOW (ref 80.0–100.0)
Platelets: 254 10*3/uL (ref 150–400)
RBC: 4.82 MIL/uL (ref 3.87–5.11)
RDW: 25 % — ABNORMAL HIGH (ref 11.5–15.5)
WBC: 12.7 10*3/uL — ABNORMAL HIGH (ref 4.0–10.5)
nRBC: 0 % (ref 0.0–0.2)

## 2022-06-19 LAB — BASIC METABOLIC PANEL
Anion gap: 10 (ref 5–15)
BUN: 20 mg/dL (ref 6–20)
CO2: 32 mmol/L (ref 22–32)
Calcium: 8.2 mg/dL — ABNORMAL LOW (ref 8.9–10.3)
Chloride: 95 mmol/L — ABNORMAL LOW (ref 98–111)
Creatinine, Ser: 0.38 mg/dL — ABNORMAL LOW (ref 0.44–1.00)
GFR, Estimated: >60 mL/min (ref >60)
Glucose, Bld: 189 mg/dL — ABNORMAL HIGH (ref 70–99)
Potassium: 3.7 mmol/L (ref 3.5–5.1)
Sodium: 137 mmol/L (ref 135–145)

## 2022-06-19 LAB — GLUCOSE, CAPILLARY
Glucose-Capillary: 185 mg/dL — ABNORMAL HIGH (ref 70–99)
Glucose-Capillary: 188 mg/dL — ABNORMAL HIGH (ref 70–99)
Glucose-Capillary: 199 mg/dL — ABNORMAL HIGH (ref 70–99)
Glucose-Capillary: 298 mg/dL — ABNORMAL HIGH (ref 70–99)
Glucose-Capillary: 315 mg/dL — ABNORMAL HIGH (ref 70–99)
Glucose-Capillary: 292 mg/dL — ABNORMAL HIGH (ref 70–99)
Glucose-Capillary: 306 mg/dL — ABNORMAL HIGH (ref 70–99)

## 2022-06-19 LAB — SURGICAL PCR SCREEN
MRSA, PCR: NEGATIVE
Staphylococcus aureus: NEGATIVE

## 2022-06-19 SURGERY — CREATION, TRACHEOSTOMY
Anesthesia: General

## 2022-06-19 MED ORDER — FENTANYL CITRATE (PF) 100 MCG/2ML IJ SOLN
INTRAMUSCULAR | Status: AC
  Filled 2022-06-19: qty 2

## 2022-06-19 MED ORDER — SODIUM CHLORIDE 0.9 % IV SOLN: INTRAVENOUS | Status: DC | PRN

## 2022-06-19 MED ORDER — SEVOFLURANE IN SOLN
RESPIRATORY_TRACT | Status: AC
  Filled 2022-06-19: qty 250

## 2022-06-19 MED ORDER — MIDAZOLAM HCL 2 MG/2ML IJ SOLN
INTRAMUSCULAR | Status: AC
  Filled 2022-06-19: qty 2

## 2022-06-19 MED ORDER — ROCURONIUM BROMIDE 10 MG/ML (PF) SYRINGE
PREFILLED_SYRINGE | INTRAVENOUS | Status: AC
  Filled 2022-06-19: qty 10

## 2022-06-19 MED ORDER — LIDOCAINE-EPINEPHRINE 1 %-1:100000 IJ SOLN
INTRAMUSCULAR | Status: DC | PRN
  Administered 2022-06-19: 5 mL

## 2022-06-19 MED ORDER — PROPOFOL 10 MG/ML IV BOLUS
INTRAVENOUS | Status: DC | PRN
  Administered 2022-06-19: 100 mg via INTRAVENOUS

## 2022-06-19 MED ORDER — DEXAMETHASONE SODIUM PHOSPHATE 10 MG/ML IJ SOLN
INTRAMUSCULAR | Status: AC
  Filled 2022-06-19: qty 1

## 2022-06-19 MED ORDER — FENTANYL CITRATE (PF) 100 MCG/2ML IJ SOLN
INTRAMUSCULAR | Status: DC | PRN
  Administered 2022-06-19 (×2): 50 ug via INTRAVENOUS

## 2022-06-19 MED ORDER — PROPOFOL 10 MG/ML IV BOLUS
INTRAVENOUS | Status: AC
  Filled 2022-06-19: qty 20

## 2022-06-19 MED ORDER — ONDANSETRON HCL 4 MG/2ML IJ SOLN
INTRAMUSCULAR | Status: AC
  Filled 2022-06-19: qty 2

## 2022-06-19 MED ORDER — LIDOCAINE-EPINEPHRINE 1 %-1:100000 IJ SOLN
INTRAMUSCULAR | Status: AC
  Filled 2022-06-19: qty 1

## 2022-06-19 MED ORDER — 0.9 % SODIUM CHLORIDE (POUR BTL) OPTIME
TOPICAL | Status: DC | PRN
  Administered 2022-06-19: 500 mL

## 2022-06-19 MED ORDER — INSULIN ASPART 100 UNIT/ML IJ SOLN
10.0000 [IU] | INTRAMUSCULAR | Status: DC
  Administered 2022-06-19 – 2022-06-21 (×9): 10 [IU] via SUBCUTANEOUS
  Filled 2022-06-19 (×7): qty 1

## 2022-06-19 MED ORDER — DEXAMETHASONE SODIUM PHOSPHATE 10 MG/ML IJ SOLN
INTRAMUSCULAR | Status: DC | PRN
  Administered 2022-06-19: 10 mg via INTRAVENOUS

## 2022-06-19 MED ORDER — ROCURONIUM BROMIDE 100 MG/10ML IV SOLN
INTRAVENOUS | Status: DC | PRN
  Administered 2022-06-19: 60 mg via INTRAVENOUS

## 2022-06-19 MED ORDER — MIDAZOLAM HCL 2 MG/2ML IJ SOLN
INTRAMUSCULAR | Status: DC | PRN
  Administered 2022-06-19: 2 mg via INTRAVENOUS

## 2022-06-19 SURGICAL SUPPLY — 40 items
BLADE SURG 15 STRL LF DISP TIS (BLADE) ×1 IMPLANT
BLADE SURG 15 STRL SS (BLADE) ×1
BLADE SURG SZ11 CARB STEEL (BLADE) ×1 IMPLANT
CORD BIP STRL DISP 12FT (MISCELLANEOUS) IMPLANT
ELECT CAUTERY BLADE TIP 2.5 (TIP) ×1
ELECT REM PT RETURN 9FT ADLT (ELECTROSURGICAL) ×1
ELECTRODE CAUTERY BLDE TIP 2.5 (TIP) ×1 IMPLANT
ELECTRODE REM PT RTRN 9FT ADLT (ELECTROSURGICAL) ×1 IMPLANT
FORCEPS JEWEL BIP 4-3/4 STR (INSTRUMENTS) IMPLANT
GAUZE 4X4 16PLY ~~LOC~~+RFID DBL (SPONGE) ×1 IMPLANT
GLOVE BIO SURGEON STRL SZ7.5 (GLOVE) ×1 IMPLANT
GOWN STRL REUS W/ TWL LRG LVL3 (GOWN DISPOSABLE) ×3 IMPLANT
GOWN STRL REUS W/TWL LRG LVL3 (GOWN DISPOSABLE) ×3
HEMOSTAT SURGICEL 2X3 (HEMOSTASIS) IMPLANT
HLDR TRACH TUBE NECKBAND 18 (MISCELLANEOUS) ×1 IMPLANT
HOLDER TRACH TUBE NECKBAND 18 (MISCELLANEOUS) ×1
LABEL OR SOLS (LABEL) ×1 IMPLANT
MANIFOLD NEPTUNE II (INSTRUMENTS) ×1 IMPLANT
NS IRRIG 500ML POUR BTL (IV SOLUTION) ×1 IMPLANT
PACK HEAD/NECK (MISCELLANEOUS) ×1 IMPLANT
SHEARS HARMONIC 9CM CVD (BLADE) ×1 IMPLANT
SPONGE DRAIN TRACH 4X4 STRL 2S (GAUZE/BANDAGES/DRESSINGS) ×1 IMPLANT
SPONGE KITTNER 5P (MISCELLANEOUS) ×1 IMPLANT
SPONGE VERSALON 4X4 4PLY (MISCELLANEOUS) ×1 IMPLANT
SUCTION FRAZIER HANDLE 10FR (MISCELLANEOUS) ×1
SUCTION TUBE FRAZIER 10FR DISP (MISCELLANEOUS) ×1 IMPLANT
SUT ETHILON 2 0 FS 18 (SUTURE) ×1 IMPLANT
SUT SILK 2 0 (SUTURE)
SUT SILK 2 0 SH (SUTURE) ×1 IMPLANT
SUT SILK 2-0 18XBRD TIE 12 (SUTURE) IMPLANT
SUT VIC AB 3-0 PS2 18 (SUTURE) ×1 IMPLANT
SYR 3ML LL SCALE MARK (SYRINGE) ×1 IMPLANT
TRAP FLUID SMOKE EVACUATOR (MISCELLANEOUS) ×1 IMPLANT
TUBE TRACH  6.0 CUFF FLEX (MISCELLANEOUS)
TUBE TRACH 6.0 CUFF FLEX (MISCELLANEOUS) IMPLANT
TUBE TRACH 8.0 EXL PROX  CUF (TUBING) ×1
TUBE TRACH 8.0 EXL PROX CUF (TUBING) IMPLANT
TUBE TRACH FLEX 8.0 CUFF (MISCELLANEOUS)
TUBE TRACH FLEX 8.5 CUFF (MISCELLANEOUS) IMPLANT
WATER STERILE IRR 500ML POUR (IV SOLUTION) ×1 IMPLANT

## 2022-06-19 NOTE — TOC Progression Note (Signed)
Transition of Care [facility]) - Progression Note    Patient Details  Name: Desiree Stone MRN: [id number] Date of Birth: 02-Jun-1975  [facility]) CM/SW Contact  Shelbie Hutching, RN Phone Number: 06/19/2022, 10:00 AM  Clinical Narrative:    Salomon Fick with Financial services responded that Med Assist has been assigned to patient to help with getting Medicaid.  Patient will need a vent/SNF.  RNCM has reached out to Med Assist for update.   Expected Discharge Plan:  (TBD) Barriers to Discharge: Continued Medical Work up  Expected Discharge Plan and Services   Discharge Planning Services: CM Consult   Living arrangements for the past 2 months: Single Family Home                                       Social Determinants of Health (SDOH) Interventions SDOH Screenings   Food Insecurity: No Food Insecurity (06/17/2022)  Housing: Low Risk  (06/17/2022)  Transportation Needs: No Transportation Needs (06/17/2022)  Utilities: Not At Risk (06/17/2022)  Physical Activity: Sufficiently Active (05/23/2017)  Social Connections: Moderately Isolated (05/23/2017)  Stress: No Stress Concern Present (05/23/2017)  Tobacco Use: Medium Risk (06/06/2022)    Readmission Risk Interventions     No data to display

## 2022-06-19 NOTE — Progress Notes (Signed)
NAME:  Desiree Stone, MRN:  [id number], DOB:  1975/07/07, LOS: 46 ADMISSION DATE:  06/06/2022, CONSULTATION DATE:  06/07/2022 REFERRING MD:  Dr. Tamala Julian, CHIEF COMPLAINT:  Shortness of breath   Brief Pt Description / Synopsis:  47 year old female admitted with acute hypoxic and hypercapnic respiratory failure in the setting of acute COPD exacerbation, new onset CHF, and suspected community-acquired pneumonia requiring intubation and mechanical ventilation.  History of Present Illness:  47 yo F presenting to [facility] ED from home with complaints of increasing shortness of breath. History provided per chart review, some items confirmed with her mother via phone interview. Patient reported on arrival to increased dyspnea over the past few months, worsening acutely in the last couple of days. She stated she could hardly walk down the hallway at the school and had to stop and rest. She denied outpatient oxygen use and has been unable to use her CPAP as she is unable to sleep in her bed. She instead has been sleeping in her chair for the past several weeks. She endorsed continued cigarette smoking. In addition she reported a productive cough with thick sputum and palpitations, as well as lower extremity edema. She denied difficulty swallowing, increased abdominal distention, or heart failure history. 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 Lab work significant for mild hyponatremia, hyperglycemia, leukocytosis, elevated but flat troponin and mildly elevated BNP. She was negative for COVID, RSV & influenza. Her respiratory status deteriorated and she was requiring 4 L [place]. CTa imaging was negative for PE, but did reveal cardiomegaly and an enlarged pulmonary artery suggestive of pulmonary hypertension. TRH was consulted for admission. The  patient's respiratory status did not improve while waiting for bed placement in the ED, with ABG demonstrating respiratory acidosis. The patient was placed on BIPAP. Follow up ABG's showed persistent respiratory acidosis even with setting adjustments. Eventually the decision was made to urgently intubate the patient and place her on mechanical ventilatory support. Medications given: lasix 40 mg, IV contrast, fentanyl, rocuronium & etomidate, lovenox, duoneb, solu medrol Initial Vitals: 98.4, 24, 98, 136/99 & 90% on 2 L [place] Significant labs: (Labs/ Imaging personally reviewed) I, Domingo Pulse [place], AGACNP-BC, personally viewed and interpreted this ECG. EKG Interpretation: Date: 06/06/22, EKG Time: 12:29, Rate: 104, Rhythm: ST, QRS Axis:  LAD, Intervals: normal, ST/T Wave abnormalities: non specific T wave abnormalities, Narrative Interpretation: ST with LAD and non specific T wave abnormalities Chemistry: Na+: 134, K+: 3.9, BUN/Cr.: 6/ 0.52, Serum CO2/ AG: 31/10, Cl: 93   CXR 06/06/22: no active cardiopulmonary disease. No evidence of pneumonia or pulmonary edema. Bronchitis/reactive airway disease, likely chronic. Borderline cardiomegaly CT soft tissue neck w contrast 06/06/22: no acute finding in the neck CT angio chest PE 06/06/22: no large central PE. The distal pulmonary arteries are not well evaluated due to the patient body habitus. No other evidence of acute cardiopulmonary pathology. Cardiomegaly with enlarged pulmonary artery suggesting pulmonary hypertension and refluxed contrast into the IVC, suggesting right heart dysfunction. Left adrenal nodule has been present since 2012 consistent with a benign adenoma.   PCCM consulted for admission due to worsening acute respiratory failure suspect multifocal secondary to acute new onset heart failure & suspected COPD in the setting of suspected pulmonary hypertension requiring emergent intubation and mechanical ventilatory support.  Please see  "significant  hospital events" section below for full detailed hospital course.  Pertinent  Medical History   Past Medical History:  Diagnosis Date   Asthma    Diabetes mellitus without complication ([place])    Hypertension     Micro Data:  1/16: SARS-CoV-2/influenza/RSV PCR>> negative 1/16: Group A strep PCR>> negative 1/17: MRSA PCR>> negative 1/17: HIV screen>> nonreactive 1/18: Blood culture x 2>> no growth  1/18: Tracheal aspirate>> normal respiratory flora  Antimicrobials:   Anti-infectives (From admission, onward)    Start     Dose/Rate Route Frequency Ordered Stop   06/13/22 2200  Ampicillin-Sulbactam (UNASYN) 3 g in sodium chloride 0.9 % 100 mL IVPB        3 g 200 mL/hr over 30 Minutes Intravenous Every 6 hours 06/13/22 1243 06/16/22 2150   06/12/22 0500  linezolid (ZYVOX) tablet 600 mg  Status:  Discontinued        600 mg Per Tube Every 12 hours 06/11/22 2301 06/13/22 1243   06/11/22 1400  piperacillin-tazobactam (ZOSYN) IVPB 3.375 g  Status:  Discontinued        3.375 g 12.5 mL/hr over 240 Minutes Intravenous Every 8 hours 06/11/22 1131 06/13/22 1243   06/11/22 1230  linezolid (ZYVOX) tablet 600 mg  Status:  Discontinued        600 mg Per Tube Every 12 hours 06/11/22 1131 06/11/22 2301   06/09/22 1100  cefTRIAXone (ROCEPHIN) 2 g in sodium chloride 0.9 % 100 mL IVPB  Status:  Discontinued        2 g 200 mL/hr over 30 Minutes Intravenous Every 24 hours 06/09/22 1006 06/11/22 1129   06/07/22 1100  azithromycin (ZITHROMAX) tablet 250 mg        250 mg Per Tube Daily 06/07/22 1006 06/11/22 0843       Significant Hospital Events: Including procedures, antibiotic start and stop dates in addition to other pertinent events   06/07/22: Admit to ICU with worsening acute respiratory failure suspect secondary to acute new onset heart failure int he setting of suspected pulmonary hypertension & chronic COPD, OSA, asthma requiring emergent intubation and mechanical ventilatory  support.  06/08/22- patient is s/p SBT with severe secretions, she did diurese >800cc today with lasix challenge, CXR with progressive pleural effusions. She has family coming in today. She's uisng OG and weve tranistioned to prednisone taper from solumedrol. Adding aldactone per tube. Iron workup today.  06/09/22-on minimal vent support, will attempt SBT.  Tracheal aspirate growing gram-positive cocci, ceftriaxone added 1/20 extubated and failed, re-intubated 1/21 severe COPD, remains on vent 1/22 severe COPD, remains on vent 1/23 severe upper airway swelling, ENT consulted 1/24 severe resp failure 1/26: Remains on vent, hemodynamically stable, tentative plan for Trach next Monday 1/29. 1/27: Remains on vent, no acute changes or events, awaiting Trach on Monday. D/C Solumedrol  1/28: Remains on vent, no acute changes.  BP soft, Lasix decreased to 40 mg daily. Low grade temperature overnight (T max 100), Obtain CT Chest 1/29 plan for [facility] TODAY CT CHEST MULTIFOCAL PNEUMONIA  Interim History / Subjective:  Remains critically ill Remains on vent Plan for [facility] TODAY +multifocal pneumonia   Objective   Blood pressure 117/73, pulse 78, temperature 98.4 F (36.9 C), resp. rate (!) 24, height '4\' 11"'$  (1.499 m), weight 130.5 kg, last menstrual period 05/30/2022, SpO2 92 %.    Vent Mode: PRVC FiO2 (%):  [40 %] 40 % Set Rate:  [24 bmp] 24 bmp Vt Set:  [380 mL] 380 mL PEEP:  [  [place] Pressure:  [21 cmH20-22 cmH20] 22 cmH20   Intake/Output Summary (Last 24 hours) at 06/19/2022 0742 Last data filed at 06/19/2022 4081 Gross per 24 hour  Intake 3383.14 ml  Output 1020 ml  Net 2363.14 ml    Filed Weights   06/17/22 0500 06/18/22 0431 06/19/22 0500  Weight: 123.6 kg 124.6 kg 130.5 kg     REVIEW OF SYSTEMS  PATIENT IS UNABLE TO PROVIDE COMPLETE REVIEW OF SYSTEMS DUE TO SEVERE CRITICAL ILLNESS   PHYSICAL EXAMINATION:  GENERAL:critically ill appearing, +resp  distress EYES: Pupils equal, round, reactive to light.  No scleral icterus.  MOUTH: Moist mucosal membrane. INTUBATED NECK: Supple.  PULMONARY: Lungs clear to auscultation, +rhonchi, +wheezing CARDIOVASCULAR: S1 and S2.  Regular rate and rhythm GASTROINTESTINAL: Soft, nontender, -distended. Positive bowel sounds.  MUSCULOSKELETAL: No swelling, clubbing, or edema.  NEUROLOGIC: obtunded,sedated SKIN:normal, warm to touch, Capillary refill delayed  Pulses present bilaterally    Assessment & Plan:   #Acute hypoxic and hypercapnic respiratory failure in the setting of acute COPD exacerbation, new onset CHF, and suspected community-acquired pneumonia PMHx: OSA on CPAP, asthma, tobacco use Failure to wean from vent  Severe ACUTE Hypoxic and Hypercapnic Respiratory Failure -continue Mechanical Ventilator support -Wean Fio2 and PEEP as tolerated -VAP/VENT bundle implementation - Wean PEEP & FiO2 as tolerated, maintain SpO2 > 88% - Head of bed elevated 30 degrees, VAP protocol in place - Plateau pressures less than 30 cm H20  - Intermittent chest x-ray & ABG PRN - Ensure adequate pulmonary hygiene   plan for Trach on today Monday 1/29  #Suspected New onset [facility] #Elevated Troponin secondary to demand ischemia #Query pulmonary hypertension PMHx: HTN, HLD Patient reports orthopnea, dyspnea on exertion & BLE edema. CTa suggestive of Right heart dysfunction and pulmonary HTN -Echocardiogram 06/07/22: LVEF 60 to 44%, normal diastolic parameters, RV function normal -Continuous cardiac monitoring -Maintain MAP >65 -Diuresis as renal function and blood pressure permits ~ decrease Lasix to 40 mg daily dosing given soft BP  community-acquired pneumonia ~ TREATED -Monitor fever curve -Trend WBC's & Procalcitonin -Follow cultures as above -Completed course of Azithromycin/Rocephin/Unasyn as outlined above    NEUROLOGY ACUTE METABOLIC ENCEPHALOPATHY -need for sedation -Goal RASS -2 to  -3   ENDO - ICU hypoglycemic\Hyperglycemia protocol -check FSBS per protocol   GI GI PROPHYLAXIS as indicated  NUTRITIONAL STATUS DIET-->on hold  Constipation protocol as indicated   ELECTROLYTES -follow labs as needed -replace as needed -pharmacy consultation and following    Best Practice (right click and "Reselect all SmartList Selections" daily)   Diet/type: tubefeeds DVT prophylaxis: Lovenox GI prophylaxis: H2B Lines: PICC line, and is still needed Foley:  Yes, and it is still needed Code Status:  full code Last date of multidisciplinary goals of care discussion [06/18/2022]  1/28: Will update pt's family when they arrive at bedside.  Labs   CBC: Recent Labs  Lab 06/16/22 0339 06/16/22 0340 06/17/22 0313 06/18/22 0359 06/19/22 0223  WBC 14.1* 14.5* 12.8* 15.8* 12.7*  NEUTROABS 11.3*  --   --   --   --   HGB 10.5* 10.7* 10.7* 10.5* 9.8*  HCT 43.3 42.7 40.6 42.7 37.8  MCV 76.4* 76.1* 77.0* 77.2* 78.4*  PLT 266 260 242 277 254     Basic Metabolic Panel: Recent Labs  Lab 06/13/22 0508 06/14/22 0411 06/15/22 0327 06/16/22 0340 06/17/22 0313 06/18/22 0359 06/19/22 0223  NA 139 140 143 144 140 142 137  K 4.1 3.7 3.4* 3.8  4.0 3.8 3.7  CL 94* 95* 101 102 97* 99 95*  CO2 35* 35* 36* 30 27 34* 32  GLUCOSE 179* 172* 138* 222* 206* 213* 189*  BUN 27* 24* 29* 30* 24* 31* 20  CREATININE 0.61 0.54 0.45 0.61 0.52 0.57 0.38*  CALCIUM 9.0 9.0 8.5* 8.5* 8.3* 9.1 8.2*  MG 2.6* 2.6* 2.2 2.2 2.3 2.3 2.3  PHOS 5.1* 4.1 4.2 3.7 3.5  --   --     GFR: Estimated Creatinine Clearance: 106 mL/min (A) (by C-G formula based on SCr of 0.38 mg/dL (L)). Recent Labs  Lab 06/16/22 0339 06/16/22 0340 06/17/22 0313 06/18/22 0359 06/19/22 0223  PROCALCITON <0.10  --   --   --   --   WBC 14.1* 14.5* 12.8* 15.8* 12.7*     Liver Function Tests: Recent Labs  Lab 06/13/22 0508 06/14/22 0411 06/15/22 0327 06/16/22 0340 06/17/22 0313  ALBUMIN 3.2* 3.4* 3.1* 3.2*  3.1*    No results for input(s): "LIPASE", "AMYLASE" in the last 168 hours. No results for input(s): "AMMONIA" in the last 168 hours.  ABG    Component Value Date/Time   PHART 7.42 06/07/2022 0828   PCO2ART 60 (H) 06/07/2022 0828   PO2ART 68 (L) 06/07/2022 0828   HCO3 38.9 (H) 06/07/2022 0828   O2SAT 95.7 06/07/2022 0828     Coagulation Profile: Recent Labs  Lab 06/13/22 2119 06/18/22 0359  INR 1.1 1.1     Cardiac Enzymes: No results for input(s): "CKTOTAL", "CKMB", "CKMBINDEX", "TROPONINI" in the last 168 hours.  HbA1C: Hgb A1c MFr Bld  Date/Time Value Ref Range Status  06/07/2022 04:54 AM 9.0 (H) 4.8 - 5.6 % Final    Comment:    (NOTE) Pre diabetes:          5.7%-6.4%  Diabetes:              >6.4%  Glycemic control for   <7.0% adults with diabetes     CBG: Recent Labs  Lab 06/18/22 1557 06/18/22 1919 06/18/22 2307 06/19/22 0256 06/19/22 0303  GLUCAP 251* 181* 240* 185* 188*      DVT/GI PRX  assessed I Assessed the need for Labs I Assessed the need for Foley I Assessed the need for Central Venous Line Family Discussion when available I Assessed the need for Mobilization I made an Assessment of medications to be adjusted accordingly Safety Risk assessment completed  CASE DISCUSSED IN MULTIDISCIPLINARY ROUNDS WITH ICU TEAM     Critical Care Time devoted to patient care services described in this note is 52 minutes.  Critical care was necessary to treat /prevent imminent and life-threatening deterioration. Overall, patient is critically ill, prognosis is guarded.  Patient with Multiorgan failure and at high risk for cardiac arrest and death.    Corrin Parker, M.D.  [facility]  Medical Director [place] Director [facility] Cardio-Pulmonary Department

## 2022-06-19 NOTE — Transfer of Care (Signed)
Immediate Anesthesia Transfer of Care Note  Patient: Desiree Stone  Procedure(s) Performed: TRACHEOSTOMY  Patient Location: ICU  Anesthesia Type:General  Level of Consciousness: sedated  Airway & Oxygen Therapy: Patient placed on Ventilator (see vital sign flow sheet for setting)  Post-op Assessment: Report given to RN  Post vital signs: Reviewed and stable  Last Vitals:  Vitals Value Taken Time  BP 148/83 06/19/22 0855  Temp    Pulse    Resp 19 06/19/22 0855  SpO2 90 % 06/19/22 0855    Last Pain:  Vitals:   06/17/22 0400  TempSrc: Esophageal  PainSc:          Complications: No notable events documented.

## 2022-06-19 NOTE — Progress Notes (Signed)
Sent message to Dr. Mortimer Fries regarding patients blood sugar of 315. Continue to assess.

## 2022-06-19 NOTE — H&P (Signed)
The patient's history has been reviewed, patient examined, no change in status, stable for surgery.  Questions were answered to the patients satisfaction.  

## 2022-06-19 NOTE — Op Note (Signed)
06/19/2022 8:45 AM  Desiree Stone [id number]  Pre-Op Dx: Acute respiratory failure with hypoxia and hypercapnia Post-Op Dx:  Same  Proc:  Tracheostomy  Surg:  [name]  Assistant: Vought  Anes:  GOT  EBL: Less than 10 cc  Comp: None  Findings: Obese neck otherwise normal anatomy  Procedure:  The patient was brought from the intensive care unit to the operating room and transferred to an operating table.  Anesthesia was administered per indwelling orotracheal tube.   Neck extension was achieved as possible anda shoulder rolke was placed.  The lower neck was palpated with the findings as described above.  1% Xylocaine with 1:100,000 epinephrine, 5 cc's, was infiltrated into the surgical field for intraoperative hemostasis.  Several minutes were allowed for this to take effect. The patient was prepped in a sterile fashion with a surgical prep from the chin down to the upper chest.  Sterile draping was accomplished in the standard fashion.  A 3-1/2 cm horizontal incision was made sharply a finger's breadth above the sternal notch, and extended through skin and subcutaneous fat.  Using cautery, the superficial layer of the deep cervical fascia was lysed.  Additional dissection revealed the strap muscles.  The midline raphe was divided in two layers and the muscles retracted laterally.  The pretracheal plane was visualized.  This was entered bluntly.  The thyroid isthmus was isolated and divided with the Harmonic scalpel.  The thyroid gland was retracted to either side.  The anterior face of the trachea was cleared.  In the third interspace, a transverse incision was made between cartilage rings into the tracheal lumen.  A 6 mm wide inferiorly based flap was generated and secured to the lower wound with a 4-0 chromic suture.   A previously tested  # 8 extended tube Shiley cuffed tracheostomy tube was brought into the field.  With the endotracheal tube under direct visualization through  the tracheostomy, it was gently backed up.  The tracheostomy tube was inserted into the tracheal lumen.  Hemostasis was observed. The cuff was inflated and observed to be intact and containing pressure. The inner cannula was placed and ventilation assumed per tracheostomy tube.  Good chest wall motion was observed, and CO2 was documented per anesthesia.  The trach tube was secured in the standard fashion with trach ties. A 2-0 silk suture was used to secure the trach tube to the skin on both sides.  Hemostasis was observed again.  When satisfactory ventilation was assured, the orotracheal tube was removed.  At this point the procedure was completed.  The patient was returned to anesthesia, awakened as possible, and transferred back to the intensive care unit in stable condition.  Comment: 47 y.o. female with prolonged ventilation was the indication for today's procedure.  Anticipate a routine postoperative recovery including standard tracheal hygiene.  The sutures should be removed in 5 days.  When the patient no longer requires ventilator or pressure support, the cuff should be deflated.  Changing to an uncuffed tube and downsizing will be according to the clinical condition of the patient.   [name]  8:45 AM 06/19/2022

## 2022-06-19 NOTE — Consult Note (Signed)
PHARMACY CONSULT NOTE - ELECTROLYTES  Pharmacy Consult for Electrolyte Monitoring and Replacement   Recent Labs: Potassium (mmol/L)  Date Value  06/19/2022 3.7   Magnesium (mg/dL)  Date Value  06/19/2022 2.3   Calcium (mg/dL)  Date Value  06/19/2022 8.2 (L)   Albumin (g/dL)  Date Value  06/17/2022 3.1 (L)   Phosphorus (mg/dL)  Date Value  06/17/2022 3.5   Sodium (mmol/L)  Date Value  06/19/2022 137   Assessment  Desiree Stone is a 47 y.o. female presenting with AHRF. 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 Pharmacy has been consulted to monitor and replace electrolytes. -new onset CHF -1/28 remains on vent-awaiting trach monday  Diet/feeding: Tube Feeds-Vital High Protein 37m/hr, FW [id number]  Pertinent medications: Lasix 40 mg IV BID-decreased to Daily on 1/28   Goal of Therapy: Electrolytes WNL  Plan:  - No replacement currently indicated  - Will recheck electrolytes with AM labs  Thank you for allowing pharmacy to be a part of this patient's care.  CaGlean SalvoPharmD, BCPS Clinical Pharmacist  06/19/2022 3:17 PM

## 2022-06-19 NOTE — Progress Notes (Addendum)
Nutrition Follow Up Note   DOCUMENTATION CODES:   Morbid obesity  INTERVENTION:   Continue Vital 1.5 '@70ml'$ /hr   Free water flushes 22m q4 hours to maintain tube patency   Regimen provides 2520kcal/day, 114g/day protein and 15857mday of free water.   Daily weights   NUTRITION DIAGNOSIS:   Inadequate oral intake related to inability to eat (pt sedated and ventilated) as evidenced by NPO status. -ongoing   GOAL:   Provide needs based on ASPEN/SCCM guidelines -met   MONITOR:   Vent status, Labs, Weight trends, TF tolerance, I & O's, Skin  ASSESSMENT:   [age]/o female with h/o OSA, HTN, DM, asthma, morbid obesity and uterine fibroid who is admitted with new COPD and CHF. Pt with new PNA.  Pt s/p tracheostomy today. NGT remains in place. Plan is to resume goal tube feeds today. Per chart, pt is down ~12lbs(4%) since admission. Pt -[id number] her I & Os. Plan is for vent/SNF at discharge. Pt may require G-tube.   Medications reviewed and include: pepcid, ferrous sulfate, lasix, robinul,  insulin, solu-medrol, MVI, miralax, propofol   Labs reviewed: K 3.7 wnl, creat 0.39(L), Mg 2.3 wnl P 3.5 wnl- 1/27 Iron 32, TIBC 518(H), ferritin 12- 1/18 Wbc- 12.7(H), Hgb 9.8(L), MCV 78.4(L), MCH 20.3(L), MCHC 25.9(H) Cbgs- 199, 188, 185 x 24 hrs  Patient is currently intubated on ventilator support MV: 9.1 L/min Temp (24hrs), Avg:98.9 F (37.2 C), Min:98.4 F (36.9 C), Max:99.7 F (37.6 C)  Propofol: 20.4 ml/hr- provides 538kcal/day   MAP- >56m17m  UOP- 575ml23met Order:   Diet Order             Diet NPO time specified  Diet effective midnight                  EDUCATION NEEDS:   No education needs have been identified at this time  Skin:  Skin Assessment: Reviewed RN Assessment  Last BM:  1/29- 445ml 60mrectal tube  Height:   Ht Readings from Last 1 Encounters:  06/08/22 '4\' 11"'$  (1.499 m)    Weight:   Wt Readings from Last 1 Encounters:  06/19/22  130.5 kg    Ideal Body Weight:  44.5 kg  BMI:  Body mass index is 58.11 kg/m.  Estimated Nutritional Needs:   Kcal:  2500-2800kcal/day  Protein:  >110g/day  Fluid:  1.4-1.6L/day  Omer Monter Koleen DistanceD, LDN Please refer to [facility] and/or RD on-call/weekend/after hours pager

## 2022-06-19 NOTE — Inpatient Diabetes Management (Signed)
Inpatient Diabetes Program Recommendations  AACE/ADA: New Consensus Statement on Inpatient Glycemic Control   Target Ranges:  Prepandial:   less than 140 mg/dL      Peak postprandial:   less than 180 mg/dL (1-2 hours)      Critically ill patients:  140 - 180 mg/dL    Latest Reference Range & Units 06/19/22 02:56 06/19/22 03:03 06/19/22 09:42 06/19/22 11:22  Glucose-Capillary 70 - 99 mg/dL 185 (H) 188 (H) 199 (H) 298 (H)    Latest Reference Range & Units 06/18/22 07:26 06/18/22 07:48 06/18/22 11:29 06/18/22 15:57 06/18/22 19:19 06/18/22 23:07  Glucose-Capillary 70 - 99 mg/dL 193 (H) 204 (H) 253 (H) 251 (H) 181 (H) 240 (H)   Review of Glycemic Control  Diabetes history: DM2 Outpatient Diabetes medications: Victoza 2.4 mg daily, Metformin 1000 mg BID Current orders for Inpatient glycemic control: Novolog 0-20 units Q4H, Novolog 5 units Q4H; Vital @ 70 ml/hr  Inpatient Diabetes Program Recommendations:    Insulin: Noted patient had tracheostomy today and TFs held. Once TF resumed, please consider increasing Novolog tube feeding coverage to Novolog 8 units Q4H.  Thanks, Barnie Alderman, RN, MSN, [place] [facility] Coordinator Inpatient Diabetes Program [phone] (Team Pager from 8am to [place])

## 2022-06-19 NOTE — Progress Notes (Signed)
OR- patient went down for trach placement.

## 2022-06-19 NOTE — Plan of Care (Signed)
Discussed in front of patient plan of care for the evening, pain management and recent surgery with no evidence of learning at this time.  Patient is a fresh trach vented with some drainage which has slowed down in bedside observation from report.  Problem: Education: Goal: Ability to describe self-care measures that may prevent or decrease complications (Diabetes Survival Skills Education) will improve Outcome: Not Progressing

## 2022-06-20 ENCOUNTER — Encounter: Payer: Self-pay | Admitting: Unknown Physician Specialty

## 2022-06-20 DIAGNOSIS — J9601 Acute respiratory failure with hypoxia: Secondary | ICD-10-CM | POA: Diagnosis not present

## 2022-06-20 DIAGNOSIS — J9602 Acute respiratory failure with hypercapnia: Secondary | ICD-10-CM | POA: Diagnosis not present

## 2022-06-20 LAB — GLUCOSE, CAPILLARY
Glucose-Capillary: 182 mg/dL — ABNORMAL HIGH (ref 70–99)
Glucose-Capillary: 218 mg/dL — ABNORMAL HIGH (ref 70–99)
Glucose-Capillary: 236 mg/dL — ABNORMAL HIGH (ref 70–99)
Glucose-Capillary: 247 mg/dL — ABNORMAL HIGH (ref 70–99)
Glucose-Capillary: 280 mg/dL — ABNORMAL HIGH (ref 70–99)
Glucose-Capillary: 286 mg/dL — ABNORMAL HIGH (ref 70–99)

## 2022-06-20 LAB — CBC
HCT: 40 % (ref 36.0–46.0)
Hemoglobin: 10.3 g/dL — ABNORMAL LOW (ref 12.0–15.0)
MCH: 19.8 pg — ABNORMAL LOW (ref 26.0–34.0)
MCHC: 25.8 g/dL — ABNORMAL LOW (ref 30.0–36.0)
MCV: 76.8 fL — ABNORMAL LOW (ref 80.0–100.0)
Platelets: 283 10*3/uL (ref 150–400)
RBC: 5.21 MIL/uL — ABNORMAL HIGH (ref 3.87–5.11)
RDW: 24.8 % — ABNORMAL HIGH (ref 11.5–15.5)
WBC: 19 10*3/uL — ABNORMAL HIGH (ref 4.0–10.5)
nRBC: 0 % (ref 0.0–0.2)

## 2022-06-20 LAB — BASIC METABOLIC PANEL
Anion gap: 7 (ref 5–15)
BUN: 25 mg/dL — ABNORMAL HIGH (ref 6–20)
CO2: 34 mmol/L — ABNORMAL HIGH (ref 22–32)
Calcium: 8.3 mg/dL — ABNORMAL LOW (ref 8.9–10.3)
Chloride: 93 mmol/L — ABNORMAL LOW (ref 98–111)
Creatinine, Ser: 0.54 mg/dL (ref 0.44–1.00)
GFR, Estimated: >60 mL/min (ref >60)
Glucose, Bld: 280 mg/dL — ABNORMAL HIGH (ref 70–99)
Potassium: 4.3 mmol/L (ref 3.5–5.1)
Sodium: 134 mmol/L — ABNORMAL LOW (ref 135–145)

## 2022-06-20 LAB — TRIGLYCERIDES: Triglycerides: 771 mg/dL — ABNORMAL HIGH (ref <150)

## 2022-06-20 LAB — PHOSPHORUS: Phosphorus: 2.7 mg/dL (ref 2.5–4.6)

## 2022-06-20 LAB — MAGNESIUM: Magnesium: 2.3 mg/dL (ref 1.7–2.4)

## 2022-06-20 MED ORDER — DEXMEDETOMIDINE HCL IN NACL 400 MCG/100ML IV SOLN
0.0000 ug/kg/h | INTRAVENOUS | Status: DC
  Administered 2022-06-20: 0.5 ug/kg/h via INTRAVENOUS
  Administered 2022-06-20: 0.4 ug/kg/h via INTRAVENOUS
  Administered 2022-06-21: 1.2 ug/kg/h via INTRAVENOUS
  Administered 2022-06-21: 1 ug/kg/h via INTRAVENOUS
  Administered 2022-06-21: 1.2 ug/kg/h via INTRAVENOUS
  Filled 2022-06-20 (×7): qty 100

## 2022-06-20 MED ORDER — INSULIN ASPART 100 UNIT/ML IJ SOLN
5.0000 [IU] | Freq: Once | INTRAMUSCULAR | Status: AC
  Administered 2022-06-20: 5 [IU] via SUBCUTANEOUS
  Filled 2022-06-20: qty 1

## 2022-06-20 MED ORDER — INSULIN GLARGINE-YFGN 100 UNIT/ML ~~LOC~~ SOLN
10.0000 [IU] | Freq: Every day | SUBCUTANEOUS | Status: DC
  Administered 2022-06-20: 10 [IU] via SUBCUTANEOUS
  Filled 2022-06-20: qty 0.1

## 2022-06-20 MED ORDER — INSULIN ASPART 100 UNIT/ML IJ SOLN
8.0000 [IU] | Freq: Once | INTRAMUSCULAR | Status: AC
  Administered 2022-06-21: 8 [IU] via SUBCUTANEOUS
  Filled 2022-06-20: qty 1

## 2022-06-20 MED ORDER — ENOXAPARIN SODIUM 80 MG/0.8ML IJ SOSY
0.5000 mg/kg | PREFILLED_SYRINGE | INTRAMUSCULAR | Status: DC
  Administered 2022-06-20 – 2022-07-04 (×15): 65 mg via SUBCUTANEOUS
  Filled 2022-06-20 (×15): qty 0.8

## 2022-06-20 MED ORDER — BUDESONIDE 0.25 MG/2ML IN SUSP
0.2500 mg | Freq: Two times a day (BID) | RESPIRATORY_TRACT | Status: DC
  Administered 2022-06-20 – 2022-06-30 (×21): 0.25 mg via RESPIRATORY_TRACT
  Filled 2022-06-20 (×21): qty 2

## 2022-06-20 MED ORDER — INSULIN GLARGINE-YFGN 100 UNIT/ML ~~LOC~~ SOLN
10.0000 [IU] | Freq: Two times a day (BID) | SUBCUTANEOUS | Status: DC
  Administered 2022-06-20: 10 [IU] via SUBCUTANEOUS
  Filled 2022-06-20: qty 0.1

## 2022-06-20 NOTE — Progress Notes (Signed)
NAME:  Desiree Stone, MRN:  [id number], DOB:  Jul 08, 1975, LOS: 61 ADMISSION DATE:  06/06/2022, CONSULTATION DATE:  06/07/2022 REFERRING MD:  Dr. Tamala Julian, CHIEF COMPLAINT:  Shortness of breath   Brief Pt Description / Synopsis:  47 year old female admitted with acute hypoxic and hypercapnic respiratory failure in the setting of acute COPD exacerbation, new onset CHF, and suspected community-acquired pneumonia requiring intubation and mechanical ventilation.  History of Present Illness:  47 yo F presenting to [facility] ED from home with complaints of increasing shortness of breath. History provided per chart review, some items confirmed with her mother via phone interview. Patient reported on arrival to increased dyspnea over the past few months, worsening acutely in the last couple of days. She stated she could hardly walk down the hallway at the school and had to stop and rest. She denied outpatient oxygen use and has been unable to use her CPAP as she is unable to sleep in her bed. She instead has been sleeping in her chair for the past several weeks. She endorsed continued cigarette smoking. In addition she reported a productive cough with thick sputum and palpitations, as well as lower extremity edema. She denied difficulty swallowing, increased abdominal distention, or heart failure history. 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 No evidence of pneumonia or pulmonary edema. Bronchitis/reactive airway disease, likely chronic. Borderline cardiomegaly CT soft tissue neck w contrast 06/06/22: no acute finding in the neck CT angio chest PE 06/06/22: no large central PE. The distal pulmonary arteries are not well evaluated due to the patient body habitus. No other evidence of acute cardiopulmonary pathology. Cardiomegaly with enlarged pulmonary artery suggesting pulmonary hypertension and refluxed contrast into the IVC, suggesting right  heart dysfunction. Left adrenal nodule has been present since 2012 consistent with a benign adenoma.   PCCM consulted for admission due to worsening acute respiratory failure suspect multifocal secondary to acute new onset heart failure & suspected COPD in the setting of suspected pulmonary hypertension requiring emergent intubation and mechanical ventilatory support.  Please see "significant hospital events" section below for full detailed hospital course.  Pertinent  Medical History   Past Medical History:  Diagnosis Date   Asthma    Diabetes mellitus without complication ([place])    Hypertension     Micro Data:  1/16: SARS-CoV-2/influenza/RSV PCR>> negative 1/16: Group A strep PCR>> negative 1/17: MRSA PCR>> negative 1/17: HIV screen>> nonreactive 1/18: Blood culture x 2>> no growth  1/18: Tracheal aspirate>> normal respiratory flora  Antimicrobials:   Anti-infectives (From admission, onward)    Start     Dose/Rate Route Frequency Ordered Stop   06/13/22 2200  Ampicillin-Sulbactam (UNASYN) 3 g in sodium chloride 0.9 % 100 mL IVPB        3 g 200 mL/hr over 30 Minutes Intravenous Every 6 hours 06/13/22 1243 06/16/22 2150   06/12/22 0500  linezolid (ZYVOX) tablet 600 mg  Status:  Discontinued        600 mg Per Tube Every 12 hours 06/11/22 2301 06/13/22 1243   06/11/22 1400  piperacillin-tazobactam (ZOSYN) IVPB 3.375 g  Status:  Discontinued        3.375 g 12.5 mL/hr over 240 Minutes Intravenous Every 8 hours 06/11/22 1131 06/13/22 1243   06/11/22 1230  linezolid (ZYVOX) tablet 600 mg  Status:  Discontinued        600 mg Per Tube Every 12 hours 06/11/22 1131 06/11/22 2301  06/09/22 1100  cefTRIAXone (ROCEPHIN) 2 g in sodium chloride 0.9 % 100 mL IVPB  Status:  Discontinued        2 g 200 mL/hr over 30 Minutes Intravenous Every 24 hours 06/09/22 1006 06/11/22 1129   06/07/22 1100  azithromycin (ZITHROMAX) tablet 250 mg        250 mg Per Tube Daily 06/07/22 1006 06/11/22 0843        Significant Hospital Events: Including procedures, antibiotic start and stop dates in addition to other pertinent events   06/07/22: Admit to ICU with worsening acute respiratory failure suspect secondary to acute new onset heart failure int he setting of suspected pulmonary hypertension & chronic COPD, OSA, asthma requiring emergent intubation and mechanical ventilatory support.  06/08/22- patient is s/p SBT with severe secretions, she did diurese >800cc today with lasix challenge, CXR with progressive pleural effusions. She has family coming in today. She's uisng OG and weve tranistioned to prednisone taper from solumedrol. Adding aldactone per tube. Iron workup today.  06/09/22-on minimal vent support, will attempt SBT.  Tracheal aspirate growing gram-positive cocci, ceftriaxone added 1/20 extubated and failed, re-intubated 1/21 severe COPD, remains on vent 1/22 severe COPD, remains on vent 1/23 severe upper airway swelling, ENT consulted 1/24 severe resp failure 1/26: Remains on vent, hemodynamically stable, tentative plan for Trach next Monday 1/29. 1/27: Remains on vent, no acute changes or events, awaiting Trach on Monday. D/C Solumedrol  1/28: Remains on vent, no acute changes.  BP soft, Lasix decreased to 40 mg daily. Low grade temperature overnight (T max 100), Obtain CT Chest 1/29 plan for [facility] TODAY CT CHEST MULTIFOCAL PNEUMONIA 1/30 s/p trach, remains on vent   Interim History / Subjective:  Remains critically ill Remains on vent S/p TRACH +multifocal pneumonia Vent Mode: PRVC FiO2 (%):  [50 %] 50 % Set Rate:  [24 bmp] 24 bmp Vt Set:  [380 mL] 380 mL PEEP:  [8 cmH20] 8 cmH20   Objective   Blood pressure 114/63, pulse 94, temperature 98.8 F (37.1 C), temperature source Oral, resp. rate 20, height '4\' 11"'$  (1.499 m), weight 130.5 kg, last menstrual period 05/30/2022, SpO2 95 %.    Vent Mode: PRVC FiO2 (%):  [50 %] 50 % Set Rate:  [24 bmp] 24 bmp Vt Set:  [380  mL] 380 mL PEEP:  [8 cmH20] 8 cmH20   Intake/Output Summary (Last 24 hours) at 06/20/2022 0734 Last data filed at 06/20/2022 0600 Gross per 24 hour  Intake 2491.33 ml  Output 1750 ml  Net 741.33 ml    Filed Weights   06/18/22 0431 06/19/22 0500 06/20/22 0417  Weight: 124.6 kg 130.5 kg 130.5 kg      REVIEW OF SYSTEMS  PATIENT IS UNABLE TO PROVIDE COMPLETE REVIEW OF SYSTEMS DUE TO SEVERE CRITICAL ILLNESS   PHYSICAL EXAMINATION:  GENERAL:critically ill appearing,  EYES: Pupils equal, round, reactive to light.  No scleral icterus.  MOUTH: Moist mucosal membrane.s/p trach PULMONARY: Lungs clear to auscultation, +rhonchi, +wheezing CARDIOVASCULAR: S1 and S2.  Regular rate and rhythm GASTROINTESTINAL: Soft, nontender, -distended. Positive bowel sounds.  MUSCULOSKELETAL: No swelling, clubbing, or edema.  NEUROLOGIC: sedated SKIN:normal, warm to touch, Capillary refill delayed  Pulses present bilaterally    Assessment & Plan:   Acute hypoxic and hypercapnic respiratory failure in the setting of acute COPD exacerbation, new onset CHF, and suspected community-acquired pneumonia PMHx: OSA on CPAP, asthma, tobacco use Failure to wean from vent  Severe ACUTE Hypoxic and Hypercapnic Respiratory Failure -continue  Mechanical Ventilator support -Wean Fio2 and PEEP as tolerated -VAP/VENT bundle implementation - Wean PEEP & FiO2 as tolerated, maintain SpO2 > 88% - Head of bed elevated 30 degrees, VAP protocol in place - Plateau pressures less than 30 cm H20  - Intermittent chest x-ray & ABG PRN - Ensure adequate pulmonary hygiene  -will perform SAT/SBT when respiratory parameters are met S/p TRACH  #Suspected New onset DCHF #Elevated Troponin secondary to demand ischemia #Query pulmonary hypertension PMHx: HTN, HLD Patient reports orthopnea, dyspnea on exertion & BLE edema. CTa suggestive of Right heart dysfunction and pulmonary HTN -Echocardiogram 06/07/22: LVEF 60 to 16%,  normal diastolic parameters, RV function normal -Continuous cardiac monitoring -Maintain MAP >65 -Diuresis as renal function and blood pressure permits ~ decrease Lasix to 40 mg daily dosing given soft BP  INFECTIOUS DISEASE community-acquired pneumonia ~ TREATED -Monitor fever curve -Trend WBC's & Procalcitonin -Follow cultures as above -Completed course of Azithromycin/Rocephin/Unasyn as outlined above    NEUROLOGY ACUTE METABOLIC ENCEPHALOPATHY WUA pending   ENDO - ICU hypoglycemic\Hyperglycemia protocol -check FSBS per protocol   GI GI PROPHYLAXIS as indicated  NUTRITIONAL STATUS DIET-->TF's as tolerated Constipation protocol as indicated   ELECTROLYTES -follow labs as needed -replace as needed -pharmacy consultation and following     Best Practice (right click and "Reselect all SmartList Selections" daily)   Diet/type: tubefeeds DVT prophylaxis: Lovenox GI prophylaxis: H2B Lines: PICC line, and is still needed Foley:  Yes, and it is still needed Code Status:  full code Last date of multidisciplinary goals of care discussion [06/18/2022]  1/28: Will update pt's family when they arrive at bedside.  Labs   CBC: Recent Labs  Lab 06/16/22 0339 06/16/22 0340 06/17/22 0313 06/18/22 0359 06/19/22 0223 06/20/22 0352  WBC 14.1* 14.5* 12.8* 15.8* 12.7* 19.0*  NEUTROABS 11.3*  --   --   --   --   --   HGB 10.5* 10.7* 10.7* 10.5* 9.8* 10.3*  HCT 43.3 42.7 40.6 42.7 37.8 40.0  MCV 76.4* 76.1* 77.0* 77.2* 78.4* 76.8*  PLT 266 260 242 277 254 283     Basic Metabolic Panel: Recent Labs  Lab 06/14/22 0411 06/15/22 0327 06/16/22 0340 06/17/22 0313 06/18/22 0359 06/19/22 0223 06/20/22 0352  NA 140 143 144 140 142 137 134*  K 3.7 3.4* 3.8 4.0 3.8 3.7 4.3  CL 95* 101 102 97* 99 95* 93*  CO2 35* 36* 30 27 34* 32 34*  GLUCOSE 172* 138* 222* 206* 213* 189* 280*  BUN 24* 29* 30* 24* 31* 20 25*  CREATININE 0.54 0.45 0.61 0.52 0.57 0.38* 0.54  CALCIUM  9.0 8.5* 8.5* 8.3* 9.1 8.2* 8.3*  MG 2.6* 2.2 2.2 2.3 2.3 2.3 2.3  PHOS 4.1 4.2 3.7 3.5  --   --  2.7    GFR: Estimated Creatinine Clearance: 106 mL/min (by C-G formula based on SCr of 0.54 mg/dL). Recent Labs  Lab 06/16/22 0339 06/16/22 0340 06/17/22 0313 06/18/22 0359 06/19/22 0223 06/20/22 0352  PROCALCITON <0.10  --   --   --   --   --   WBC 14.1*   < > 12.8* 15.8* 12.7* 19.0*   < > = values in this interval not displayed.     Liver Function Tests: Recent Labs  Lab 06/14/22 0411 06/15/22 0327 06/16/22 0340 06/17/22 0313  ALBUMIN 3.4* 3.1* 3.2* 3.1*    No results for input(s): "LIPASE", "AMYLASE" in the last 168 hours. No results for input(s): "AMMONIA" in the last 168  hours.  ABG    Component Value Date/Time   PHART 7.42 06/07/2022 0828   PCO2ART 60 (H) 06/07/2022 0828   PO2ART 68 (L) 06/07/2022 0828   HCO3 38.9 (H) 06/07/2022 0828   O2SAT 95.7 06/07/2022 0828     Coagulation Profile: Recent Labs  Lab 06/13/22 2119 06/18/22 0359  INR 1.1 1.1     Cardiac Enzymes: No results for input(s): "CKTOTAL", "CKMB", "CKMBINDEX", "TROPONINI" in the last 168 hours.  HbA1C: Hgb A1c MFr Bld  Date/Time Value Ref Range Status  06/07/2022 04:54 AM 9.0 (H) 4.8 - 5.6 % Final    Comment:    (NOTE) Pre diabetes:          5.7%-6.4%  Diabetes:              >6.4%  Glycemic control for   <7.0% adults with diabetes     CBG: Recent Labs  Lab 06/19/22 1122 06/19/22 1555 06/19/22 2003 06/19/22 2257 06/20/22 0407  GLUCAP 298* 315* 292* 306* 286*      DVT/GI PRX  assessed I Assessed the need for Labs I Assessed the need for Foley I Assessed the need for Central Venous Line Family Discussion when available I Assessed the need for Mobilization I made an Assessment of medications to be adjusted accordingly Safety Risk assessment completed  CASE DISCUSSED IN MULTIDISCIPLINARY ROUNDS WITH ICU TEAM     Critical Care Time devoted to patient care services  described in this note is 45 minutes.  Critical care was necessary to treat /prevent imminent and life-threatening deterioration.    Corrin Parker, M.D.  [facility]  Medical Director [place] Director [facility] Cardio-Pulmonary Department

## 2022-06-20 NOTE — Anesthesia Postprocedure Evaluation (Signed)
Anesthesia Post Note  Patient: Desiree Stone  Procedure(s) Performed: TRACHEOSTOMY  Patient location during evaluation: ICU Anesthesia Type: General Level of consciousness: sedated Pain management: pain level controlled Vital Signs Assessment: post-procedure vital signs reviewed and stable Respiratory status: patient on ventilator - see flowsheet for VS Cardiovascular status: stable Postop Assessment: no apparent nausea or vomiting Anesthetic complications: no   No notable events documented.   Last Vitals:  Vitals:   06/20/22 0600 06/20/22 0700  BP: 114/60 114/63  Pulse: 84 94  Resp: (!) 22 20  Temp:    SpO2: 94% 95%    Last Pain:  Vitals:   06/20/22 0358  TempSrc: Oral  PainSc:                  Norm Salt

## 2022-06-20 NOTE — Inpatient Diabetes Management (Signed)
Inpatient Diabetes Program Recommendations  AACE/ADA: New Consensus Statement on Inpatient Glycemic Control   Target Ranges:  Prepandial:   less than 140 mg/dL      Peak postprandial:   less than 180 mg/dL (1-2 hours)      Critically ill patients:  140 - 180 mg/dL    Latest Reference Range & Units 06/19/22 03:03 06/19/22 09:42 06/19/22 11:22 06/19/22 15:55 06/19/22 20:03 06/19/22 22:57 06/20/22 04:07 06/20/22 07:57  Glucose-Capillary 70 - 99 mg/dL 188 (H) 199 (H) 298 (H) 315 (H) 292 (H) 306 (H) 286 (H) 280 (H)    Review of Glycemic Control  Diabetes history: DM2 Outpatient Diabetes medications: Victoza 2.4 mg daily, Metformin 1000 mg BID Current orders for Inpatient glycemic control: Novolog 0-20 units Q4H, Novolog 10 units Q4H; Vital @ 70 ml/hr   Inpatient Diabetes Program Recommendations:     Insulin: Patient received Decadron 10 mg at 7:42 am on 06/19/22 which is contributing to hyperglycemia.  Noted Novolog tube feeding increased 06/19/22. Please consider ordering Levemir 10 units BID (to start now).  Thanks, Barnie Alderman, RN, MSN, [place] Diabetes Coordinator Inpatient Diabetes Program [phone] (Team Pager from 8am to [place])

## 2022-06-21 DIAGNOSIS — J9601 Acute respiratory failure with hypoxia: Secondary | ICD-10-CM | POA: Diagnosis not present

## 2022-06-21 DIAGNOSIS — J9602 Acute respiratory failure with hypercapnia: Secondary | ICD-10-CM | POA: Diagnosis not present

## 2022-06-21 LAB — URINALYSIS, COMPLETE (UACMP) WITH MICROSCOPIC
Bilirubin Urine: NEGATIVE
Glucose, UA: NEGATIVE mg/dL
Hgb urine dipstick: NEGATIVE
Ketones, ur: NEGATIVE mg/dL
Nitrite: NEGATIVE
Protein, ur: 100 mg/dL — AB
RBC / HPF: >50 RBC/hpf (ref 0–5)
Specific Gravity, Urine: 1.033 — ABNORMAL HIGH (ref 1.005–1.030)
WBC, UA: >50 WBC/hpf (ref 0–5)
pH: 6 (ref 5.0–8.0)

## 2022-06-21 LAB — BASIC METABOLIC PANEL
Anion gap: 5 (ref 5–15)
BUN: 22 mg/dL — ABNORMAL HIGH (ref 6–20)
CO2: 37 mmol/L — ABNORMAL HIGH (ref 22–32)
Calcium: 8.6 mg/dL — ABNORMAL LOW (ref 8.9–10.3)
Chloride: 100 mmol/L (ref 98–111)
Creatinine, Ser: 0.53 mg/dL (ref 0.44–1.00)
GFR, Estimated: >60 mL/min (ref >60)
Glucose, Bld: 234 mg/dL — ABNORMAL HIGH (ref 70–99)
Potassium: 4 mmol/L (ref 3.5–5.1)
Sodium: 142 mmol/L (ref 135–145)

## 2022-06-21 LAB — CBC
HCT: 35.7 % — ABNORMAL LOW (ref 36.0–46.0)
Hemoglobin: 8.9 g/dL — ABNORMAL LOW (ref 12.0–15.0)
MCH: 19.1 pg — ABNORMAL LOW (ref 26.0–34.0)
MCHC: 24.9 g/dL — ABNORMAL LOW (ref 30.0–36.0)
MCV: 76.8 fL — ABNORMAL LOW (ref 80.0–100.0)
Platelets: 260 10*3/uL (ref 150–400)
RBC: 4.65 MIL/uL (ref 3.87–5.11)
RDW: 25.8 % — ABNORMAL HIGH (ref 11.5–15.5)
WBC: 15.4 10*3/uL — ABNORMAL HIGH (ref 4.0–10.5)
nRBC: 0 % (ref 0.0–0.2)

## 2022-06-21 LAB — GLUCOSE, CAPILLARY
Glucose-Capillary: 206 mg/dL — ABNORMAL HIGH (ref 70–99)
Glucose-Capillary: 258 mg/dL — ABNORMAL HIGH (ref 70–99)
Glucose-Capillary: 312 mg/dL — ABNORMAL HIGH (ref 70–99)
Glucose-Capillary: 296 mg/dL — ABNORMAL HIGH (ref 70–99)
Glucose-Capillary: 239 mg/dL — ABNORMAL HIGH (ref 70–99)
Glucose-Capillary: 290 mg/dL — ABNORMAL HIGH (ref 70–99)

## 2022-06-21 LAB — PHOSPHORUS: Phosphorus: 2.5 mg/dL (ref 2.5–4.6)

## 2022-06-21 LAB — MAGNESIUM: Magnesium: 2 mg/dL (ref 1.7–2.4)

## 2022-06-21 LAB — PROCALCITONIN: Procalcitonin: <0.1 ng/mL

## 2022-06-21 MED ORDER — INSULIN ASPART 100 UNIT/ML IJ SOLN
10.0000 [IU] | INTRAMUSCULAR | Status: DC
  Administered 2022-06-21 (×3): 10 [IU] via SUBCUTANEOUS
  Filled 2022-06-21 (×3): qty 1

## 2022-06-21 MED ORDER — INSULIN GLARGINE-YFGN 100 UNIT/ML ~~LOC~~ SOLN
15.0000 [IU] | Freq: Two times a day (BID) | SUBCUTANEOUS | Status: DC
  Administered 2022-06-21 (×2): 15 [IU] via SUBCUTANEOUS
  Filled 2022-06-21 (×2): qty 0.15

## 2022-06-21 MED ORDER — BANATROL TF EN LIQD
60.0000 mL | Freq: Two times a day (BID) | ENTERAL | Status: DC
  Administered 2022-06-21 – 2022-06-27 (×13): 60 mL
  Filled 2022-06-21 (×13): qty 60

## 2022-06-21 MED ORDER — INSULIN ASPART 100 UNIT/ML IJ SOLN
4.0000 [IU] | Freq: Once | INTRAMUSCULAR | Status: AC
  Administered 2022-06-21: 4 [IU] via SUBCUTANEOUS

## 2022-06-21 MED ORDER — INSULIN ASPART 100 UNIT/ML IJ SOLN
5.0000 [IU] | Freq: Once | INTRAMUSCULAR | Status: AC
  Administered 2022-06-21: 5 [IU] via SUBCUTANEOUS

## 2022-06-21 MED ORDER — SODIUM CHLORIDE 0.9 % IV SOLN
2.0000 g | Freq: Three times a day (TID) | INTRAVENOUS | Status: DC
  Administered 2022-06-21 – 2022-06-26 (×15): 2 g via INTRAVENOUS
  Filled 2022-06-21: qty 2
  Filled 2022-06-21 (×3): qty 12.5
  Filled 2022-06-21: qty 2
  Filled 2022-06-21: qty 12.5
  Filled 2022-06-21 (×4): qty 2
  Filled 2022-06-21 (×2): qty 12.5
  Filled 2022-06-21 (×2): qty 2
  Filled 2022-06-21: qty 12.5
  Filled 2022-06-21 (×2): qty 2

## 2022-06-21 MED ORDER — PROPOFOL 1000 MG/100ML IV EMUL
5.0000 ug/kg/min | INTRAVENOUS | Status: DC
  Administered 2022-06-21: 10 ug/kg/min via INTRAVENOUS
  Administered 2022-06-21 (×2): 25 ug/kg/min via INTRAVENOUS
  Administered 2022-06-22: 15 ug/kg/min via INTRAVENOUS
  Administered 2022-06-22 (×3): 25 ug/kg/min via INTRAVENOUS
  Administered 2022-06-23 (×2): 15 ug/kg/min via INTRAVENOUS
  Filled 2022-06-21 (×9): qty 100

## 2022-06-21 MED ORDER — MIDAZOLAM HCL 2 MG/2ML IJ SOLN: 2.0000 mg | Freq: Once | INTRAMUSCULAR | Status: AC

## 2022-06-21 MED ORDER — ACETAZOLAMIDE SODIUM 500 MG IJ SOLR
500.0000 mg | Freq: Once | INTRAMUSCULAR | Status: AC
  Administered 2022-06-21: 500 mg via INTRAVENOUS
  Filled 2022-06-21: qty 500

## 2022-06-21 MED ORDER — INSULIN ASPART 100 UNIT/ML IJ SOLN
13.0000 [IU] | INTRAMUSCULAR | Status: DC
  Administered 2022-06-21 (×2): 13 [IU] via SUBCUTANEOUS
  Filled 2022-06-21 (×2): qty 1

## 2022-06-21 MED ORDER — POLYETHYLENE GLYCOL 3350 17 G PO PACK: 17.0000 g | PACK | Freq: Every day | ORAL | Status: DC | PRN

## 2022-06-21 MED ORDER — MIDAZOLAM HCL 2 MG/2ML IJ SOLN
INTRAMUSCULAR | Status: AC
  Administered 2022-06-21: 2 mg via INTRAVENOUS
  Filled 2022-06-21: qty 2

## 2022-06-21 MED ORDER — INSULIN ASPART 100 UNIT/ML IJ SOLN: 13.0000 [IU] | INTRAMUSCULAR | Status: DC

## 2022-06-21 MED ORDER — INSULIN ASPART 100 UNIT/ML IJ SOLN: 15.0000 [IU] | INTRAMUSCULAR | Status: DC

## 2022-06-21 MED ORDER — OXYCODONE HCL 5 MG PO TABS
10.0000 mg | ORAL_TABLET | Freq: Four times a day (QID) | ORAL | Status: DC
  Administered 2022-06-21 – 2022-06-25 (×16): 10 mg
  Filled 2022-06-21 (×16): qty 2

## 2022-06-21 MED ORDER — STERILE WATER FOR INJECTION IJ SOLN
INTRAMUSCULAR | Status: AC
  Filled 2022-06-21: qty 10

## 2022-06-21 MED ORDER — QUETIAPINE FUMARATE 25 MG PO TABS
25.0000 mg | ORAL_TABLET | Freq: Every day | ORAL | Status: DC
  Administered 2022-06-21 – 2022-06-24 (×4): 25 mg
  Filled 2022-06-21 (×4): qty 1

## 2022-06-21 MED ORDER — IBUPROFEN 100 MG/5ML PO SUSP
800.0000 mg | Freq: Four times a day (QID) | ORAL | Status: DC | PRN
  Administered 2022-06-21 – 2022-06-22 (×2): 800 mg
  Filled 2022-06-21 (×4): qty 40

## 2022-06-21 MED ORDER — ACETAMINOPHEN 325 MG PO TABS
650.0000 mg | ORAL_TABLET | ORAL | Status: DC | PRN
  Administered 2022-06-21 – 2022-07-01 (×6): 650 mg
  Filled 2022-06-21 (×7): qty 2

## 2022-06-21 MED ORDER — ACETAMINOPHEN 650 MG RE SUPP: 650.0000 mg | Freq: Four times a day (QID) | RECTAL | Status: DC | PRN

## 2022-06-21 NOTE — Inpatient Diabetes Management (Signed)
Inpatient Diabetes Program Recommendations  AACE/ADA: New Consensus Statement on Inpatient Glycemic Control   Target Ranges:  Prepandial:   less than 140 mg/dL      Peak postprandial:   less than 180 mg/dL (1-2 hours)      Critically ill patients:  140 - 180 mg/dL    Latest Reference Range & Units 06/20/22 07:57 06/20/22 11:13 06/20/22 16:08 06/20/22 19:34 06/20/22 23:38 06/21/22 03:19 06/21/22 07:32  Glucose-Capillary 70 - 99 mg/dL 280 (H) 182 (H) 218 (H) 236 (H) 247 (H) 206 (H) 258 (H)   Review of Glycemic Control  Diabetes history: DM2 Outpatient Diabetes medications: Victoza 2.4 mg daily, Metformin 1000 mg BID Current orders for Inpatient glycemic control: Semglee 15 units BID, Novolog 0-20 units Q4H, Novolog 10 units Q4H; Vital @ 70 ml/hr   Inpatient Diabetes Program Recommendations:     Insulin: Noted Semglee increased from 10 to 15 units BID today. If glucose is consistently over 180 mg/dl, consider increasing tube feeding coverage to Novolog 13 units Q4H.   Thanks, Barnie Alderman, RN, MSN, [place] Diabetes Coordinator Inpatient Diabetes Program [phone] (Team Pager from 8am to [place])

## 2022-06-21 NOTE — Progress Notes (Signed)
Unable to weigh patient this morning due to bed error message.  Cameron Ali, RN

## 2022-06-21 NOTE — Progress Notes (Signed)
Tylenol '650mg'$  crushed and given via dob hoff for oral temperature of 101.3

## 2022-06-21 NOTE — TOC Progression Note (Signed)
Transition of Care [facility]) - Progression Note    Patient Details  Name: Desiree Stone MRN: [id number] Date of Birth: August 15, 1975  [facility] CM/SW Contact  Shelbie Hutching, RN Phone Number: 06/21/2022, 10:13 AM  Clinical Narrative:    Patient remains on the ventilator, sedated, may need a PEG tube.     Expected Discharge Plan:  (TBD) Barriers to Discharge: Continued Medical Work up  Expected Discharge Plan and Services   Discharge Planning Services: CM Consult   Living arrangements for the past 2 months: Single Family Home                                       Social Determinants of Health (SDOH) Interventions SDOH Screenings   Food Insecurity: No Food Insecurity (06/17/2022)  Housing: Low Risk  (06/17/2022)  Transportation Needs: No Transportation Needs (06/17/2022)  Utilities: Not At Risk (06/17/2022)  Physical Activity: Sufficiently Active (05/23/2017)  Social Connections: Moderately Isolated (05/23/2017)  Stress: No Stress Concern Present (05/23/2017)  Tobacco Use: Medium Risk (06/20/2022)    Readmission Risk Interventions     No data to display

## 2022-06-21 NOTE — Progress Notes (Signed)
Toribio Harbour, NP made aware of patients Heart Rate, will continue to assess.

## 2022-06-21 NOTE — Progress Notes (Signed)
Tylenol '650mg'$  given via dob hoff for oral temperature of 103.3

## 2022-06-21 NOTE — Consult Note (Signed)
PHARMACY CONSULT NOTE - ELECTROLYTES  Pharmacy Consult for Electrolyte Monitoring and Replacement   Recent Labs: Potassium (mmol/L)  Date Value  06/21/2022 4.0   Magnesium (mg/dL)  Date Value  06/21/2022 2.0   Calcium (mg/dL)  Date Value  06/21/2022 8.6 (L)   Albumin (g/dL)  Date Value  06/17/2022 3.1 (L)   Phosphorus (mg/dL)  Date Value  06/21/2022 2.5   Sodium (mmol/L)  Date Value  06/21/2022 142   Assessment  Desiree Stone is a 47 y.o. female presenting with AHRF. 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 Pharmacy has been consulted to monitor and replace electrolytes.  Diet/feeding: Tube Feeds-Vital High Protein 52m/hr, FW [id number]  Pertinent medications: furosemide IV 40 mg daily, acetazolamide IV 500 mg x 1, Lantus 15 units BID, resistant SSI, Novolog 10 units Q4 hours   Goal of Therapy: Electrolytes WNL  Plan:  - No replacement currently indicated  - Will recheck electrolytes with AM labs  Thank you for allowing pharmacy to be a part of this patient's care.  CaGlean SalvoPharmD, BCPS Clinical Pharmacist  06/21/2022 8:04 AM

## 2022-06-21 NOTE — Progress Notes (Signed)
Patient continues with elevated oral temperature of 103.2.  Dr. Mortimer Fries notified.  Order to pause precedex drip to assess if precedex is causing elevated temperature.  Drip stopped at 14:06.

## 2022-06-21 NOTE — Progress Notes (Signed)
Patient awake but not following commands. Heart rate 150-160.  Patient moving around in bed as if she is uncomfortable.  Patient with increased respiratory rate and work of breathing.  Oral temperature down to 100.2 since prece dex drip was turned off at 14:06. Order obtained for versed 2 mg IV and given for mentioned symptoms in the note.

## 2022-06-21 NOTE — Progress Notes (Signed)
NAME:  Desiree Stone, MRN:  [id number], DOB:  [birth year], LOS: 50 ADMISSION DATE:  06/06/2022, CONSULTATION DATE:  06/07/2022 REFERRING MD:  Dr. Tamala Julian, CHIEF COMPLAINT:  Shortness of breath   Brief Pt Description / Synopsis:  47 year old female admitted with acute hypoxic and hypercapnic respiratory failure in the setting of acute COPD exacerbation, new onset CHF, and suspected community-acquired pneumonia requiring intubation and mechanical ventilation.  History of Present Illness:  47 yo F presenting to [facility] ED from home with complaints of increasing shortness of breath. History provided per chart review, some items confirmed with her mother via phone interview. Patient reported on arrival to increased dyspnea over the past few months, worsening acutely in the last couple of days. She stated she could hardly walk down the hallway at the school and had to stop and rest. She denied outpatient oxygen use and has been unable to use her CPAP as she is unable to sleep in her bed. She instead has been sleeping in her chair for the past several weeks. She endorsed continued cigarette smoking. In addition she reported a productive cough with thick sputum and palpitations, as well as lower extremity edema. She denied difficulty swallowing, increased abdominal distention, or heart failure history. 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 No evidence of pneumonia or pulmonary edema. Bronchitis/reactive airway disease, likely chronic. Borderline cardiomegaly CT soft tissue neck w contrast 06/06/22: no acute finding in the neck CT angio chest PE 06/06/22: no large central PE. The distal pulmonary arteries are not well evaluated due to the patient body habitus. No other evidence of acute cardiopulmonary pathology. Cardiomegaly with enlarged pulmonary artery suggesting pulmonary hypertension and refluxed contrast into the IVC, suggesting right  heart dysfunction. Left adrenal nodule has been present since 2012 consistent with a benign adenoma.   PCCM consulted for admission due to worsening acute respiratory failure suspect multifocal secondary to acute new onset heart failure & suspected COPD in the setting of suspected pulmonary hypertension requiring emergent intubation and mechanical ventilatory support.  Please see "significant hospital events" section below for full detailed hospital course.  Pertinent  Medical History   Past Medical History:  Diagnosis Date   Asthma    Diabetes mellitus without complication ([place])    Hypertension     Micro Data:  1/16: SARS-CoV-2/influenza/RSV PCR>> negative 1/16: Group A strep PCR>> negative 1/17: MRSA PCR>> negative 1/17: HIV screen>> nonreactive 1/18: Blood culture x 2>> no growth  1/18: Tracheal aspirate>> normal respiratory flora  Antimicrobials:   Anti-infectives (From admission, onward)    Start     Dose/Rate Route Frequency Ordered Stop   06/13/22 2200  Ampicillin-Sulbactam (UNASYN) 3 g in sodium chloride 0.9 % 100 mL IVPB        3 g 200 mL/hr over 30 Minutes Intravenous Every 6 hours 06/13/22 1243 06/16/22 2150   06/12/22 0500  linezolid (ZYVOX) tablet 600 mg  Status:  Discontinued        600 mg Per Tube Every 12 hours 06/11/22 2301 06/13/22 1243   06/11/22 1400  piperacillin-tazobactam (ZOSYN) IVPB 3.375 g  Status:  Discontinued        3.375 g 12.5 mL/hr over 240 Minutes Intravenous Every 8 hours 06/11/22 1131 06/13/22 1243   06/11/22 1230  linezolid (ZYVOX) tablet 600 mg  Status:  Discontinued        600 mg Per Tube Every 12 hours 06/11/22 1131 06/11/22 2301  06/09/22 1100  cefTRIAXone (ROCEPHIN) 2 g in sodium chloride 0.9 % 100 mL IVPB  Status:  Discontinued        2 g 200 mL/hr over 30 Minutes Intravenous Every 24 hours 06/09/22 1006 06/11/22 1129   06/07/22 1100  azithromycin (ZITHROMAX) tablet 250 mg        250 mg Per Tube Daily 06/07/22 1006 06/11/22 0843        Significant Hospital Events: Including procedures, antibiotic start and stop dates in addition to other pertinent events   06/07/22: Admit to ICU with worsening acute respiratory failure suspect secondary to acute new onset heart failure int he setting of suspected pulmonary hypertension & chronic COPD, OSA, asthma requiring emergent intubation and mechanical ventilatory support.  06/08/22- patient is s/p SBT with severe secretions, she did diurese >800cc today with lasix challenge, CXR with progressive pleural effusions. She has family coming in today. She's uisng OG and weve tranistioned to prednisone taper from solumedrol. Adding aldactone per tube. Iron workup today.  06/09/22-on minimal vent support, will attempt SBT.  Tracheal aspirate growing gram-positive cocci, ceftriaxone added 1/20 extubated and failed, re-intubated 1/21 severe COPD, remains on vent 1/22 severe COPD, remains on vent 1/23 severe upper airway swelling, ENT consulted 1/24 severe resp failure 1/26: Remains on vent, hemodynamically stable, tentative plan for Trach next Monday 1/29. 1/27: Remains on vent, no acute changes or events, awaiting Trach on Monday. D/C Solumedrol  1/28: Remains on vent, no acute changes.  BP soft, Lasix decreased to 40 mg daily. Low grade temperature overnight (T max 100), Obtain CT Chest 1/29 plan for [facility] TODAY CT CHEST MULTIFOCAL PNEUMONIA 1/30 s/p trach, remains on vent 1/31 severe agitation last night   Interim History / Subjective:  Remains on vent Morbidly obese CT chest with significant pneumonia S/p TRACH +multifocal pneumonia   Vent Mode: PRVC FiO2 (%):  [40 %-45 %] 45 % Set Rate:  [24 bmp] 24 bmp Vt Set:  [380 mL] 380 mL PEEP:  [5 cmH20-8 cmH20] 5 cmH20 Plateau Pressure:  [19 cmH20-23 cmH20] 19 cmH20   Objective   Blood pressure 133/70, pulse 88, temperature 100.3 F (37.9 C), temperature source Axillary, resp. rate (!) 34, height '4\' 11"'$  (1.499 m), weight 130.5  kg, last menstrual period 05/30/2022, SpO2 91 %.    Vent Mode: PRVC FiO2 (%):  [40 %-45 %] 45 % Set Rate:  [24 bmp] 24 bmp Vt Set:  [380 mL] 380 mL PEEP:  [5 cmH20-8 cmH20] 5 cmH20 Plateau Pressure:  [19 cmH20-23 cmH20] 19 cmH20   Intake/Output Summary (Last 24 hours) at 06/21/2022 0729 Last data filed at 06/21/2022 0000 Gross per 24 hour  Intake 2072.45 ml  Output 1250 ml  Net 822.45 ml    Filed Weights   06/18/22 0431 06/19/22 0500 06/20/22 0417  Weight: 124.6 kg 130.5 kg 130.5 kg      REVIEW OF SYSTEMS  PATIENT IS UNABLE TO PROVIDE COMPLETE REVIEW OF SYSTEMS DUE TO SEVERE CRITICAL ILLNESS   PHYSICAL EXAMINATION:  GENERAL:critically ill appearing, +resp distress EYES: Pupils equal, round, reactive to light.  No scleral icterus.  MOUTH: Moist mucosal membrane. S/p TRACH NECK: Supple.  PULMONARY: Lungs clear to auscultation, +rhonchi, +wheezing CARDIOVASCULAR: S1 and S2.  Regular rate and rhythm GASTROINTESTINAL: Soft, nontender, -distended. Positive bowel sounds.  MUSCULOSKELETAL: No swelling, clubbing, or edema.  NEUROLOGIC: obtunded,sedated SKIN:normal, warm to touch, Capillary refill delayed  Pulses present bilaterally     Assessment & Plan:   Acute hypoxic and  hypercapnic respiratory failure in the setting of acute COPD exacerbation, new onset CHF, and suspected community-acquired pneumonia PMHx: OSA on CPAP, asthma, tobacco use Failure to wean from vent  Severe ACUTE Hypoxic and Hypercapnic Respiratory Failure -continue Mechanical Ventilator support -Wean Fio2 and PEEP as tolerated -VAP/VENT bundle implementation - Wean PEEP & FiO2 as tolerated, maintain SpO2 > 88% - Head of bed elevated 30 degrees, VAP protocol in place - Plateau pressures less than 30 cm H20  - Intermittent chest x-ray & ABG PRN - Ensure adequate pulmonary hygiene  -will perform SAT/SBT when respiratory parameters are met S/p TRACH  #Suspected New onset DCHF #Elevated Troponin  secondary to demand ischemia #Query pulmonary hypertension PMHx: HTN, HLD Patient reports orthopnea, dyspnea on exertion & BLE edema. CTa suggestive of Right heart dysfunction and pulmonary HTN -Echocardiogram 06/07/22: LVEF 60 to 72%, normal diastolic parameters, RV function normal -Continuous cardiac monitoring -Maintain MAP >65 -Diuresis as renal function and blood pressure permits      INFECTIOUS DISEASE community-acquired pneumonia ~ TREATED    NEUROLOGY ACUTE METABOLIC ENCEPHALOPATHY WUA pending Precedex started  ACUTE ANEMIA- TRANSFUSE AS NEEDED CONSIDER TRANSFUSION  IF HGB<7    ENDO - ICU hypoglycemic\Hyperglycemia protocol -check FSBS per protocol   GI GI PROPHYLAXIS as indicated  NUTRITIONAL STATUS DIET-->TF's as tolerated Constipation protocol as indicated   ELECTROLYTES -follow labs as needed -replace as needed -pharmacy consultation and following   Best Practice (right click and "Reselect all SmartList Selections" daily)   Diet/type: tubefeeds DVT prophylaxis: Lovenox GI prophylaxis: H2B Lines: PICC line, and is still needed Foley:  Yes, and it is still needed Code Status:  full code Last date of multidisciplinary goals of care discussion [06/18/2022]  1/28: Will update pt's family when they arrive at bedside. 1/30 Mother updated  Labs   CBC: Recent Labs  Lab 06/16/22 0339 06/16/22 0340 06/17/22 0313 06/18/22 0359 06/19/22 0223 06/20/22 0352 06/21/22 0431  WBC 14.1*   < > 12.8* 15.8* 12.7* 19.0* 15.4*  NEUTROABS 11.3*  --   --   --   --   --   --   HGB 10.5*   < > 10.7* 10.5* 9.8* 10.3* 8.9*  HCT 43.3   < > 40.6 42.7 37.8 40.0 35.7*  MCV 76.4*   < > 77.0* 77.2* 78.4* 76.8* 76.8*  PLT 266   < > 242 277 254 283 260   < > = values in this interval not displayed.     Basic Metabolic Panel: Recent Labs  Lab 06/15/22 0327 06/16/22 0340 06/17/22 0313 06/18/22 0359 06/19/22 0223 06/20/22 0352 06/21/22 0431  NA 143 144 140  142 137 134* 142  K 3.4* 3.8 4.0 3.8 3.7 4.3 4.0  CL 101 102 97* 99 95* 93* 100  CO2 36* 30 27 34* 32 34* 37*  GLUCOSE 138* 222* 206* 213* 189* 280* 234*  BUN 29* 30* 24* 31* 20 25* 22*  CREATININE 0.45 0.61 0.52 0.57 0.38* 0.54 0.53  CALCIUM 8.5* 8.5* 8.3* 9.1 8.2* 8.3* 8.6*  MG 2.2 2.2 2.3 2.3 2.3 2.3 2.0  PHOS 4.2 3.7 3.5  --   --  2.7 2.5    GFR: Estimated Creatinine Clearance: 106 mL/min (by C-G formula based on SCr of 0.53 mg/dL). Recent Labs  Lab 06/16/22 0339 06/16/22 0340 06/18/22 0359 06/19/22 0223 06/20/22 0352 06/21/22 0431  PROCALCITON <0.10  --   --   --   --   --   WBC 14.1*   < >  15.8* 12.7* 19.0* 15.4*   < > = values in this interval not displayed.     Liver Function Tests: Recent Labs  Lab 06/15/22 0327 06/16/22 0340 06/17/22 0313  ALBUMIN 3.1* 3.2* 3.1*    No results for input(s): "LIPASE", "AMYLASE" in the last 168 hours. No results for input(s): "AMMONIA" in the last 168 hours.  ABG    Component Value Date/Time   PHART 7.42 06/07/2022 0828   PCO2ART 60 (H) 06/07/2022 0828   PO2ART 68 (L) 06/07/2022 0828   HCO3 38.9 (H) 06/07/2022 0828   O2SAT 95.7 06/07/2022 0828     Coagulation Profile: Recent Labs  Lab 06/18/22 0359  INR 1.1     Cardiac Enzymes: No results for input(s): "CKTOTAL", "CKMB", "CKMBINDEX", "TROPONINI" in the last 168 hours.  HbA1C: Hgb A1c MFr Bld  Date/Time Value Ref Range Status  06/07/2022 04:54 AM 9.0 (H) 4.8 - 5.6 % Final    Comment:    (NOTE) Pre diabetes:          5.7%-6.4%  Diabetes:              >6.4%  Glycemic control for   <7.0% adults with diabetes     CBG: Recent Labs  Lab 06/20/22 1113 06/20/22 1608 06/20/22 1934 06/20/22 2338 06/21/22 0319  GLUCAP 182* 218* 236* 247* 206*       DVT/GI PRX  assessed I Assessed the need for Labs I Assessed the need for Foley I Assessed the need for Central Venous Line Family Discussion when available I Assessed the need for Mobilization I  made an Assessment of medications to be adjusted accordingly Safety Risk assessment completed  CASE DISCUSSED IN MULTIDISCIPLINARY ROUNDS WITH ICU TEAM     Critical Care Time devoted to patient care services described in this note is 45 minutes.  Critical care was necessary to treat /prevent imminent and life-threatening deterioration.   Corrin Parker, M.D.  [facility]  Medical Director [place] Director [facility] Cardio-Pulmonary Department

## 2022-06-21 NOTE — Progress Notes (Signed)
Dr. Mortimer Fries made aware of oral temperature of 103. Tylenol '650mg'$  given at 8:44 this morning. No new orders obtained to control fever at this time.

## 2022-06-22 ENCOUNTER — Inpatient Hospital Stay: Payer: Medicaid Other

## 2022-06-22 DIAGNOSIS — J9602 Acute respiratory failure with hypercapnia: Secondary | ICD-10-CM | POA: Diagnosis not present

## 2022-06-22 DIAGNOSIS — J9601 Acute respiratory failure with hypoxia: Secondary | ICD-10-CM | POA: Diagnosis not present

## 2022-06-22 LAB — GLUCOSE, CAPILLARY
Glucose-Capillary: 181 mg/dL — ABNORMAL HIGH (ref 70–99)
Glucose-Capillary: 201 mg/dL — ABNORMAL HIGH (ref 70–99)
Glucose-Capillary: 209 mg/dL — ABNORMAL HIGH (ref 70–99)
Glucose-Capillary: 210 mg/dL — ABNORMAL HIGH (ref 70–99)
Glucose-Capillary: 215 mg/dL — ABNORMAL HIGH (ref 70–99)
Glucose-Capillary: 225 mg/dL — ABNORMAL HIGH (ref 70–99)
Glucose-Capillary: 228 mg/dL — ABNORMAL HIGH (ref 70–99)
Glucose-Capillary: 229 mg/dL — ABNORMAL HIGH (ref 70–99)
Glucose-Capillary: 231 mg/dL — ABNORMAL HIGH (ref 70–99)
Glucose-Capillary: 231 mg/dL — ABNORMAL HIGH (ref 70–99)
Glucose-Capillary: 232 mg/dL — ABNORMAL HIGH (ref 70–99)
Glucose-Capillary: 235 mg/dL — ABNORMAL HIGH (ref 70–99)
Glucose-Capillary: 237 mg/dL — ABNORMAL HIGH (ref 70–99)
Glucose-Capillary: 238 mg/dL — ABNORMAL HIGH (ref 70–99)
Glucose-Capillary: 243 mg/dL — ABNORMAL HIGH (ref 70–99)
Glucose-Capillary: 248 mg/dL — ABNORMAL HIGH (ref 70–99)
Glucose-Capillary: 250 mg/dL — ABNORMAL HIGH (ref 70–99)
Glucose-Capillary: 255 mg/dL — ABNORMAL HIGH (ref 70–99)
Glucose-Capillary: 268 mg/dL — ABNORMAL HIGH (ref 70–99)
Glucose-Capillary: 279 mg/dL — ABNORMAL HIGH (ref 70–99)
Glucose-Capillary: 294 mg/dL — ABNORMAL HIGH (ref 70–99)

## 2022-06-22 LAB — CBC
HCT: 39.7 % (ref 36.0–46.0)
Hemoglobin: 10.1 g/dL — ABNORMAL LOW (ref 12.0–15.0)
MCH: 19.7 pg — ABNORMAL LOW (ref 26.0–34.0)
MCHC: 25.4 g/dL — ABNORMAL LOW (ref 30.0–36.0)
MCV: 77.4 fL — ABNORMAL LOW (ref 80.0–100.0)
Platelets: 290 10*3/uL (ref 150–400)
RBC: 5.13 MIL/uL — ABNORMAL HIGH (ref 3.87–5.11)
RDW: 25.6 % — ABNORMAL HIGH (ref 11.5–15.5)
WBC: 17.6 10*3/uL — ABNORMAL HIGH (ref 4.0–10.5)
nRBC: 0 % (ref 0.0–0.2)

## 2022-06-22 LAB — RENAL FUNCTION PANEL
Albumin: 2.9 g/dL — ABNORMAL LOW (ref 3.5–5.0)
Anion gap: 8 (ref 5–15)
BUN: 23 mg/dL — ABNORMAL HIGH (ref 6–20)
CO2: 31 mmol/L (ref 22–32)
Calcium: 8.4 mg/dL — ABNORMAL LOW (ref 8.9–10.3)
Chloride: 101 mmol/L (ref 98–111)
Creatinine, Ser: 0.61 mg/dL (ref 0.44–1.00)
GFR, Estimated: >60 mL/min (ref >60)
Glucose, Bld: 244 mg/dL — ABNORMAL HIGH (ref 70–99)
Phosphorus: 4.6 mg/dL (ref 2.5–4.6)
Potassium: 3.3 mmol/L — ABNORMAL LOW (ref 3.5–5.1)
Sodium: 140 mmol/L (ref 135–145)

## 2022-06-22 LAB — MAGNESIUM: Magnesium: 2.3 mg/dL (ref 1.7–2.4)

## 2022-06-22 LAB — TRIGLYCERIDES: Triglycerides: 115 mg/dL (ref <150)

## 2022-06-22 LAB — POTASSIUM: Potassium: 4.4 mmol/L (ref 3.5–5.1)

## 2022-06-22 MED ORDER — DEXTROSE 50 % IV SOLN: 0.0000 mL | INTRAVENOUS | Status: DC | PRN

## 2022-06-22 MED ORDER — ORAL CARE MOUTH RINSE
15.0000 mL | OROMUCOSAL | Status: DC
  Administered 2022-06-22 – 2022-06-27 (×59): 15 mL via OROMUCOSAL

## 2022-06-22 MED ORDER — INSULIN REGULAR(HUMAN) IN NACL 100-0.9 UT/100ML-% IV SOLN
INTRAVENOUS | Status: DC
  Administered 2022-06-22: 14 [IU]/h via INTRAVENOUS
  Administered 2022-06-22: 22 [IU]/h via INTRAVENOUS
  Administered 2022-06-22 (×2): 13 [IU]/h via INTRAVENOUS
  Administered 2022-06-23: 14 [IU]/h via INTRAVENOUS
  Administered 2022-06-23: 30 [IU]/h via INTRAVENOUS
  Administered 2022-06-23: 20 [IU]/h via INTRAVENOUS
  Administered 2022-06-23: 30 [IU]/h via INTRAVENOUS
  Administered 2022-06-23: 25 [IU]/h via INTRAVENOUS
  Administered 2022-06-24: 22 [IU]/h via INTRAVENOUS
  Administered 2022-06-24: 10.5 [IU]/h via INTRAVENOUS
  Administered 2022-06-25: 8.5 [IU]/h via INTRAVENOUS
  Filled 2022-06-22 (×12): qty 100

## 2022-06-22 MED ORDER — POTASSIUM CHLORIDE 10 MEQ/50ML IV SOLN
10.0000 meq | INTRAVENOUS | Status: AC
  Administered 2022-06-22 (×4): 10 meq via INTRAVENOUS
  Filled 2022-06-22 (×4): qty 50

## 2022-06-22 MED ORDER — ORAL CARE MOUTH RINSE: 15.0000 mL | OROMUCOSAL | Status: DC | PRN

## 2022-06-22 MED ORDER — FENTANYL CITRATE PF 50 MCG/ML IJ SOSY
50.0000 ug | PREFILLED_SYRINGE | INTRAMUSCULAR | Status: DC | PRN
  Administered 2022-06-23: 50 ug via INTRAVENOUS
  Filled 2022-06-22: qty 1

## 2022-06-22 MED ORDER — CHLORHEXIDINE GLUCONATE CLOTH 2 % EX PADS
6.0000 | MEDICATED_PAD | Freq: Every day | CUTANEOUS | Status: DC
  Administered 2022-06-22 – 2022-06-30 (×9): 6 via TOPICAL

## 2022-06-22 MED ORDER — POTASSIUM CHLORIDE 20 MEQ PO PACK
40.0000 meq | PACK | Freq: Once | ORAL | Status: AC
  Administered 2022-06-22: 40 meq
  Filled 2022-06-22: qty 2

## 2022-06-22 NOTE — Progress Notes (Signed)
NAME:  Desiree Stone, MRN:  [id number], DOB:  01/24/1976, LOS: 47 ADMISSION DATE:  06/06/2022, CONSULTATION DATE:  06/07/2022 REFERRING MD:  Dr. Tamala Julian, CHIEF COMPLAINT:  Shortness of breath   Brief Pt Description / Synopsis:  47 year old female admitted with acute hypoxic and hypercapnic respiratory failure in the setting of acute COPD exacerbation, new onset CHF, and suspected community-acquired pneumonia requiring intubation and mechanical ventilation.  History of Present Illness:  47 yo F presenting to [facility] ED from home with complaints of increasing shortness of breath. History provided per chart review, some items confirmed with her mother via phone interview. Patient reported on arrival to increased dyspnea over the past few months, worsening acutely in the last couple of days. She stated she could hardly walk down the hallway at the school and had to stop and rest. She denied outpatient oxygen use and has been unable to use her CPAP as she is unable to sleep in her bed. She instead has been sleeping in her chair for the past several weeks. She endorsed continued cigarette smoking. In addition she reported a productive cough with thick sputum and palpitations, as well as lower extremity edema. She denied difficulty swallowing, increased abdominal distention, or heart failure history. 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 No evidence of pneumonia or pulmonary edema. Bronchitis/reactive airway disease, likely chronic. Borderline cardiomegaly CT soft tissue neck w contrast 06/06/22: no acute finding in the neck CT angio chest PE 06/06/22: no large central PE. The distal pulmonary arteries are not well evaluated due to the patient body habitus. No other evidence of acute cardiopulmonary pathology. Cardiomegaly with enlarged pulmonary artery suggesting pulmonary hypertension and refluxed contrast into the IVC, suggesting right  heart dysfunction. Left adrenal nodule has been present since 2012 consistent with a benign adenoma.   PCCM consulted for admission due to worsening acute respiratory failure suspect multifocal secondary to acute new onset heart failure & suspected COPD in the setting of suspected pulmonary hypertension requiring emergent intubation and mechanical ventilatory support.  Please see "significant hospital events" section below for full detailed hospital course.  Pertinent  Medical History   Past Medical History:  Diagnosis Date   Asthma    Diabetes mellitus without complication ([place])    Hypertension     Micro Data:  1/16: SARS-CoV-2/influenza/RSV PCR>> negative 1/16: Group A strep PCR>> negative 1/17: MRSA PCR>> negative 1/17: HIV screen>> nonreactive 1/18: Blood culture x 2>> no growth  1/18: Tracheal aspirate>> normal respiratory flora  Antimicrobials:   Anti-infectives (From admission, onward)    Start     Dose/Rate Route Frequency Ordered Stop   06/21/22 1015  ceFEPIme (MAXIPIME) 2 g in sodium chloride 0.9 % 100 mL IVPB        2 g 200 mL/hr over 30 Minutes Intravenous Every 8 hours 06/21/22 0921     06/13/22 2200  Ampicillin-Sulbactam (UNASYN) 3 g in sodium chloride 0.9 % 100 mL IVPB        3 g 200 mL/hr over 30 Minutes Intravenous Every 6 hours 06/13/22 1243 06/16/22 2150   06/12/22 0500  linezolid (ZYVOX) tablet 600 mg  Status:  Discontinued        600 mg Per Tube Every 12 hours 06/11/22 2301 06/13/22 1243   06/11/22 1400  piperacillin-tazobactam (ZOSYN) IVPB 3.375 g  Status:  Discontinued        3.375 g 12.5 mL/hr over 240 Minutes Intravenous Every  8 hours 06/11/22 1131 06/13/22 1243   06/11/22 1230  linezolid (ZYVOX) tablet 600 mg  Status:  Discontinued        600 mg Per Tube Every 12 hours 06/11/22 1131 06/11/22 2301   06/09/22 1100  cefTRIAXone (ROCEPHIN) 2 g in sodium chloride 0.9 % 100 mL IVPB  Status:  Discontinued        2 g 200 mL/hr over 30 Minutes Intravenous  Every 24 hours 06/09/22 1006 06/11/22 1129   06/07/22 1100  azithromycin (ZITHROMAX) tablet 250 mg        250 mg Per Tube Daily 06/07/22 1006 06/11/22 0843       Significant Hospital Events: Including procedures, antibiotic start and stop dates in addition to other pertinent events   06/07/22: Admit to ICU with worsening acute respiratory failure suspect secondary to acute new onset heart failure int he setting of suspected pulmonary hypertension & chronic COPD, OSA, asthma requiring emergent intubation and mechanical ventilatory support.  06/08/22- patient is s/p SBT with severe secretions, she did diurese >800cc today with lasix challenge, CXR with progressive pleural effusions. She has family coming in today. She's uisng OG and weve tranistioned to prednisone taper from solumedrol. Adding aldactone per tube. Iron workup today.  06/09/22-on minimal vent support, will attempt SBT.  Tracheal aspirate growing gram-positive cocci, ceftriaxone added 1/20 extubated and failed, re-intubated 1/21 severe COPD, remains on vent 1/22 severe COPD, remains on vent 1/23 severe upper airway swelling, ENT consulted 1/24 severe resp failure 1/26: Remains on vent, hemodynamically stable, tentative plan for Trach next Monday 1/29. 1/27: Remains on vent, no acute changes or events, awaiting Trach on Monday. D/C Solumedrol  1/28: Remains on vent, no acute changes.  BP soft, Lasix decreased to 40 mg daily. Low grade temperature overnight (T max 100), Obtain CT Chest 1/29 plan for [facility] TODAY CT CHEST MULTIFOCAL PNEUMONIA 1/30 s/p trach, remains on vent 1/31 severe agitation last night, febrile episodes   Interim History / Subjective:  S/p trach Remains on vent Morbidly obese with severe lung damage CT chest with significant pneumonia S/p [facility] +multifocal pneumonia  Febrile episodes Cultures pending Given tylenol and motrin Severe delirious -started pain meds scheduled Started propofol and insulin drip  last night  Vent Mode: PRVC FiO2 (%):  [45 %] 45 % Set Rate:  [24 bmp] 24 bmp Vt Set:  [380 mL] 380 mL PEEP:  [5 cmH20-8 cmH20] 8 cmH20   Objective   Blood pressure 106/65, pulse 96, temperature 98.5 F (36.9 C), temperature source Oral, resp. rate (!) 26, height '4\' 11"'$  (1.499 m), weight 130.5 kg, last menstrual period 05/30/2022, SpO2 94 %.    Vent Mode: PRVC FiO2 (%):  [45 %] 45 % Set Rate:  [24 bmp] 24 bmp Vt Set:  [380 mL] 380 mL PEEP:  [5 cmH20-8 cmH20] 8 cmH20   Intake/Output Summary (Last 24 hours) at 06/22/2022 0710 Last data filed at 06/22/2022 0640 Gross per 24 hour  Intake 3690.38 ml  Output 2775 ml  Net 915.38 ml    Filed Weights   06/18/22 0431 06/19/22 0500 06/20/22 0417  Weight: 124.6 kg 130.5 kg 130.5 kg       REVIEW OF SYSTEMS  PATIENT IS UNABLE TO PROVIDE COMPLETE REVIEW OF SYSTEMS DUE TO SEVERE CRITICAL ILLNESS   PHYSICAL EXAMINATION:  GENERAL:critically ill appearing, +resp distress EYES: Pupils equal, round, reactive to light.  No scleral icterus.  MOUTH: Moist mucosal membrane.s/ p trach on vent NECK: Supple.  PULMONARY: Lungs  clear to auscultation, +rhonchi, +wheezing CARDIOVASCULAR: S1 and S2.  Regular rate and rhythm GASTROINTESTINAL: Soft, nontender, -distended. Positive bowel sounds.  MUSCULOSKELETAL: No swelling, clubbing, or edema.  NEUROLOGIC: obtunded,sedated SKIN:normal, warm to touch, Capillary refill delayed  Pulses present bilaterally   Assessment & Plan:   Acute hypoxic and hypercapnic respiratory failure in the setting of acute COPD exacerbation, HFpEF and suspected community-acquired pneumonia failure to wean from vent failed trial of extubation s/p trach, now with fevers PMHx: OSA on CPAP, asthma, tobacco use Failure to wean from vent-s/;p trach Plan for PS trial as tolerated  Severe ACUTE Hypoxic and Hypercapnic Respiratory Failure -continue Mechanical Ventilator support -Wean Fio2 and PEEP as tolerated -VAP/VENT  bundle implementation - Wean PEEP & FiO2 as tolerated, maintain SpO2 > 88% - Head of bed elevated 30 degrees, VAP protocol in place - Plateau pressures less than 30 cm H20  - Intermittent chest x-ray & ABG PRN - Ensure adequate pulmonary hygiene  S/p TRACH   #Suspected New onset DCHF #Elevated Troponin secondary to demand ischemia PMHx: HTN, HLD Patient reports orthopnea, dyspnea on exertion & BLE edema. CTa suggestive of Right heart dysfunction and pulmonary HTN -Echocardiogram 06/07/22: LVEF 60 to 03%, normal diastolic parameters, RV function normal -Continuous cardiac monitoring -Maintain MAP >65 -Diuresis as renal function and blood pressure permits    INFECTIOUS DISEASE community-acquired pneumonia ~ TREATED Febrile episodes Restarted cefepime for presumed VAP  NEUROLOGY ACUTE METABOLIC ENCEPHALOPATHY WUA pending Precedex stopped  ACUTE ANEMIA- TRANSFUSE AS NEEDED CONSIDER TRANSFUSION  IF HGB<7   ENDO - ICU hypoglycemic\Hyperglycemia protocol -check FSBS per protocol   GI GI PROPHYLAXIS as indicated  NUTRITIONAL STATUS DIET-->TF's as tolerated Constipation protocol as indicated   ELECTROLYTES -follow labs as needed -replace as needed -pharmacy consultation and following  Best Practice (right click and "Reselect all SmartList Selections" daily)   Diet/type: tubefeeds DVT prophylaxis: Lovenox GI prophylaxis: H2B Lines: PICC line, and is still needed Foley:  Yes, and it is still needed Code Status:  full code   1/28: Will update pt's family when they arrive at bedside. 1/30 Mother updated 1/31 mother updated  Labs   CBC: Recent Labs  Lab 06/16/22 0339 06/16/22 0340 06/18/22 0359 06/19/22 0223 06/20/22 0352 06/21/22 0431 06/22/22 0438  WBC 14.1*   < > 15.8* 12.7* 19.0* 15.4* 17.6*  NEUTROABS 11.3*  --   --   --   --   --   --   HGB 10.5*   < > 10.5* 9.8* 10.3* 8.9* 10.1*  HCT 43.3   < > 42.7 37.8 40.0 35.7* 39.7  MCV 76.4*   < > 77.2*  78.4* 76.8* 76.8* 77.4*  PLT 266   < > 277 254 283 260 290   < > = values in this interval not displayed.     Basic Metabolic Panel: Recent Labs  Lab 06/16/22 0340 06/17/22 0313 06/18/22 0359 06/19/22 0223 06/20/22 0352 06/21/22 0431 06/22/22 0438  NA 144 140 142 137 134* 142 140  K 3.8 4.0 3.8 3.7 4.3 4.0 3.3*  CL 102 97* 99 95* 93* 100 101  CO2 30 27 34* 32 34* 37* 31  GLUCOSE 222* 206* 213* 189* 280* 234* 244*  BUN 30* 24* 31* 20 25* 22* 23*  CREATININE 0.61 0.52 0.57 0.38* 0.54 0.53 0.61  CALCIUM 8.5* 8.3* 9.1 8.2* 8.3* 8.6* 8.4*  MG 2.2 2.3 2.3 2.3 2.3 2.0 2.3  PHOS 3.7 3.5  --   --  2.7 2.5 4.6  GFR: Estimated Creatinine Clearance: 106 mL/min (by C-G formula based on SCr of 0.61 mg/dL). Recent Labs  Lab 06/16/22 0339 06/16/22 0340 06/19/22 0223 06/20/22 0352 06/21/22 0431 06/22/22 0438  PROCALCITON <0.10  --   --   --  <0.10  --   WBC 14.1*   < > 12.7* 19.0* 15.4* 17.6*   < > = values in this interval not displayed.     Liver Function Tests: Recent Labs  Lab 06/16/22 0340 06/17/22 0313 06/22/22 0438  ALBUMIN 3.2* 3.1* 2.9*    No results for input(s): "LIPASE", "AMYLASE" in the last 168 hours. No results for input(s): "AMMONIA" in the last 168 hours.  ABG    Component Value Date/Time   PHART 7.42 06/07/2022 0828   PCO2ART 60 (H) 06/07/2022 0828   PO2ART 68 (L) 06/07/2022 0828   HCO3 38.9 (H) 06/07/2022 0828   O2SAT 95.7 06/07/2022 0828     Coagulation Profile: Recent Labs  Lab 06/18/22 0359  INR 1.1     Cardiac Enzymes: No results for input(s): "CKTOTAL", "CKMB", "CKMBINDEX", "TROPONINI" in the last 168 hours.  HbA1C: Hgb A1c MFr Bld  Date/Time Value Ref Range Status  06/07/2022 04:54 AM 9.0 (H) 4.8 - 5.6 % Final    Comment:    (NOTE) Pre diabetes:          5.7%-6.4%  Diabetes:              >6.4%  Glycemic control for   <7.0% adults with diabetes     CBG: Recent Labs  Lab 06/21/22 2306 06/22/22 0317 06/22/22 0424  06/22/22 0530 06/22/22 0630  GLUCAP 290* 248* 229* 250* 235*       DVT/GI PRX  assessed I Assessed the need for Labs I Assessed the need for Foley I Assessed the need for Central Venous Line Family Discussion when available I Assessed the need for Mobilization I made an Assessment of medications to be adjusted accordingly Safety Risk assessment completed  CASE DISCUSSED IN MULTIDISCIPLINARY ROUNDS WITH ICU TEAM  Critical Care Time devoted to patient care services described in this note is 55 minutes.  Critical care was necessary to treat /prevent imminent and life-threatening deterioration. Overall, patient is critically ill, prognosis is guarded.     Corrin Parker, M.D.  [facility]  Medical Director [place] Director [facility] Cardio-Pulmonary Department

## 2022-06-22 NOTE — Progress Notes (Signed)
Poorly Controlled Type 2 Diabetes Mellitus Unable to bring glucose below 200 with increased long acting and high dose short acting insulin coverage. Multiple adjustments to regimen without impact. - Phase 2 ICU Glycemic control initiated - start insulin drip per protocol - follow K+ closely - target range while in ICU: 140-180 - follow ICU hyper/hypo-glycemia protocol - Diabetes Coordinator consulted, appreciate input    Domingo Pulse Rust[place], AGACNP-BC Acute Care Nurse Practitioner [place]   [phone] / [phone] Please see Amion for pager details.

## 2022-06-22 NOTE — Consult Note (Addendum)
PHARMACY CONSULT NOTE - ELECTROLYTES  Pharmacy Consult for Electrolyte Monitoring and Replacement   Recent Labs: Potassium (mmol/L)  Date Value  06/22/2022 3.3 (L)   Magnesium (mg/dL)  Date Value  06/22/2022 2.3   Calcium (mg/dL)  Date Value  06/22/2022 8.4 (L)   Albumin (g/dL)  Date Value  06/22/2022 2.9 (L)   Phosphorus (mg/dL)  Date Value  06/22/2022 4.6   Sodium (mmol/L)  Date Value  06/22/2022 140   Assessment  Desiree Stone is a 47 y.o. female presenting with AHRF. 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 Pharmacy has been consulted to monitor and replace electrolytes.  Diet/feeding: Tube Feeds-Vital High Protein 70m/hr, FW [id number]  Pertinent medications: furosemide IV 40 mg daily, insulin infusion per hyperglycemia protocol (started 1/31 overnight), propofol (triglycerides: 115 mg/dL on 2/1), fentanyl infusion  Goal of Therapy: Electrolytes WNL  Plan:  - Kcl IV 10 mEq x 4 runs and Kcl 40 mEq per tube x 1 ordered by medical team - Will recheck electrolytes with AM labs  Thank you for allowing pharmacy to be a part of this patient's care.  CaGlean SalvoPharmD, BCPS Clinical Pharmacist  06/22/2022 8:02 AM

## 2022-06-22 NOTE — Progress Notes (Addendum)
Nutrition Follow-up  DOCUMENTATION CODES:   Morbid obesity  INTERVENTION:   -Continue TF via NGT:  Vital 1.5 '@70ml'$ /hr    Free water flushes 52m q4 hours to maintain tube patency    Regimen provides 2520kcal/day, 114g/day protein and 15846mday of free water.    Daily weights   Continue 60 ml Banatrol BID  NUTRITION DIAGNOSIS:   Inadequate oral intake related to inability to eat (pt sedated and ventilated) as evidenced by NPO status.  Ongoing  GOAL:   Provide needs based on ASPEN/SCCM guidelines  Met with TF  MONITOR:   Vent status, Labs, Weight trends, TF tolerance, I & O's, Skin  REASON FOR ASSESSMENT:   Ventilator    ASSESSMENT:   [age]/o female with h/o OSA, HTN, DM, asthma, morbid obesity and uterine fibroid who is admitted with new COPD and CHF. Pt with new PNA.  1/29- s/p trach  Patient on ventilator support via trach. MV: 10.7 L/min Temp (24hrs), Avg:101.5 F (38.6 C), Min:98.5 F (36.9 C), Max:103.6 F (39.8 C)  Propofol: 20.4 ml/hr (provides 539 kcals)   Reviewed I/O's: +1.2 L x 24 hours and +2.6 L since 06/18/22  UOP: 2.7 L x 24 hours  Rectal tube output: 100 ml x 24 hours  Case discussed with RN, MD, and during ICU rounds. Pt is opening eyes, but not following commands. Plan for wake up assessment when pt's mother arrives later today. Pt currently with rectal tube; per RN, some improvement with addition of fiber- stool is a type 6 stool.   Pt continues to receive TF via NGT: Vital 1.5 @ 70 ml/hr. Per MD, pt mother has agreed to PEG placement; awaiting IR consult.   Pt was transitioned to insulin drip secondary to hyperglycemia. MD suspects propofol may be increasing CBGS.   Medications reviewed and include lasix and potassium chloride.   Labs reviewed: K: 3.3, CBGS: 201-231 (inpatient orders for glycemic control are insulin drip).    Diet Order:   Diet Order             Diet NPO time specified  Diet effective midnight                    EDUCATION NEEDS:   No education needs have been identified at this time  Skin:  Skin Assessment: Reviewed RN Assessment  Last BM:  1/29- [id number] rectal tube  Height:   Ht Readings from Last 1 Encounters:  06/08/22 '4\' 11"'$  (1.499 m)    Weight:   Wt Readings from Last 1 Encounters:  06/20/22 130.5 kg    Ideal Body Weight:  44.5 kg  BMI:  Body mass index is 58.11 kg/m.  Estimated Nutritional Needs:   Kcal:  2500-2800kcal/day  Protein:  >110g/day  Fluid:  1.4-1.6L/day    JenLoistine ChanceD, LDN, CDCDogtowngistered Dietitian II Certified Diabetes Care and Education Specialist Please refer to [facility] RD and/or RD on-call/weekend/after hours pager

## 2022-06-22 NOTE — Progress Notes (Signed)
Patient tachycardic, febrile, and CBG increasing despite insulin gtt titration. PRN medications given for fever. MD Kasa made aware.

## 2022-06-23 DIAGNOSIS — J45909 Unspecified asthma, uncomplicated: Secondary | ICD-10-CM | POA: Diagnosis not present

## 2022-06-23 DIAGNOSIS — G928 Other toxic encephalopathy: Secondary | ICD-10-CM

## 2022-06-23 DIAGNOSIS — J9601 Acute respiratory failure with hypoxia: Secondary | ICD-10-CM | POA: Diagnosis not present

## 2022-06-23 DIAGNOSIS — E1169 Type 2 diabetes mellitus with other specified complication: Secondary | ICD-10-CM

## 2022-06-23 LAB — CBC
HCT: 34.3 % — ABNORMAL LOW (ref 36.0–46.0)
Hemoglobin: 9.1 g/dL — ABNORMAL LOW (ref 12.0–15.0)
MCH: 20.6 pg — ABNORMAL LOW (ref 26.0–34.0)
MCHC: 26.5 g/dL — ABNORMAL LOW (ref 30.0–36.0)
MCV: 77.6 fL — ABNORMAL LOW (ref 80.0–100.0)
Platelets: 250 10*3/uL (ref 150–400)
RBC: 4.42 MIL/uL (ref 3.87–5.11)
RDW: 25.8 % — ABNORMAL HIGH (ref 11.5–15.5)
WBC: 22.3 10*3/uL — ABNORMAL HIGH (ref 4.0–10.5)
nRBC: 0 % (ref 0.0–0.2)

## 2022-06-23 LAB — RENAL FUNCTION PANEL
Albumin: 2.4 g/dL — ABNORMAL LOW (ref 3.5–5.0)
Anion gap: 5 (ref 5–15)
BUN: 16 mg/dL (ref 6–20)
CO2: 29 mmol/L (ref 22–32)
Calcium: 7.6 mg/dL — ABNORMAL LOW (ref 8.9–10.3)
Chloride: 102 mmol/L (ref 98–111)
Creatinine, Ser: 0.46 mg/dL (ref 0.44–1.00)
GFR, Estimated: >60 mL/min (ref >60)
Glucose, Bld: 202 mg/dL — ABNORMAL HIGH (ref 70–99)
Phosphorus: 3.2 mg/dL (ref 2.5–4.6)
Potassium: 4.1 mmol/L (ref 3.5–5.1)
Sodium: 136 mmol/L (ref 135–145)

## 2022-06-23 LAB — GLUCOSE, CAPILLARY
Glucose-Capillary: 116 mg/dL — ABNORMAL HIGH (ref 70–99)
Glucose-Capillary: 149 mg/dL — ABNORMAL HIGH (ref 70–99)
Glucose-Capillary: 150 mg/dL — ABNORMAL HIGH (ref 70–99)
Glucose-Capillary: 161 mg/dL — ABNORMAL HIGH (ref 70–99)
Glucose-Capillary: 162 mg/dL — ABNORMAL HIGH (ref 70–99)
Glucose-Capillary: 170 mg/dL — ABNORMAL HIGH (ref 70–99)
Glucose-Capillary: 175 mg/dL — ABNORMAL HIGH (ref 70–99)
Glucose-Capillary: 191 mg/dL — ABNORMAL HIGH (ref 70–99)
Glucose-Capillary: 192 mg/dL — ABNORMAL HIGH (ref 70–99)
Glucose-Capillary: 196 mg/dL — ABNORMAL HIGH (ref 70–99)
Glucose-Capillary: 199 mg/dL — ABNORMAL HIGH (ref 70–99)
Glucose-Capillary: 200 mg/dL — ABNORMAL HIGH (ref 70–99)
Glucose-Capillary: 200 mg/dL — ABNORMAL HIGH (ref 70–99)
Glucose-Capillary: 202 mg/dL — ABNORMAL HIGH (ref 70–99)
Glucose-Capillary: 202 mg/dL — ABNORMAL HIGH (ref 70–99)
Glucose-Capillary: 202 mg/dL — ABNORMAL HIGH (ref 70–99)
Glucose-Capillary: 203 mg/dL — ABNORMAL HIGH (ref 70–99)
Glucose-Capillary: 218 mg/dL — ABNORMAL HIGH (ref 70–99)
Glucose-Capillary: 219 mg/dL — ABNORMAL HIGH (ref 70–99)
Glucose-Capillary: 230 mg/dL — ABNORMAL HIGH (ref 70–99)

## 2022-06-23 LAB — MAGNESIUM: Magnesium: 2 mg/dL (ref 1.7–2.4)

## 2022-06-23 LAB — CULTURE, RESPIRATORY W GRAM STAIN: Culture: NORMAL

## 2022-06-23 LAB — PROCALCITONIN: Procalcitonin: 0.37 ng/mL

## 2022-06-23 LAB — TRIGLYCERIDES: Triglycerides: 92 mg/dL (ref <150)

## 2022-06-23 MED ORDER — IPRATROPIUM-ALBUTEROL 0.5-2.5 (3) MG/3ML IN SOLN
3.0000 mL | Freq: Four times a day (QID) | RESPIRATORY_TRACT | Status: DC
  Administered 2022-06-23 – 2022-07-04 (×45): 3 mL via RESPIRATORY_TRACT
  Filled 2022-06-23 (×46): qty 3

## 2022-06-23 MED ORDER — FUROSEMIDE 10 MG/ML IJ SOLN
20.0000 mg | Freq: Once | INTRAMUSCULAR | Status: AC
  Administered 2022-06-23: 20 mg via INTRAVENOUS
  Filled 2022-06-23: qty 2

## 2022-06-23 MED ORDER — VITAL 1.5 CAL PO LIQD
1000.0000 mL | ORAL | Status: DC
  Administered 2022-06-23 – 2022-06-25 (×4): 1000 mL

## 2022-06-23 MED ORDER — PROSOURCE TF20 ENFIT COMPATIBL EN LIQD
60.0000 mL | Freq: Every day | ENTERAL | Status: DC
  Administered 2022-06-23 – 2022-06-27 (×5): 60 mL

## 2022-06-23 MED ORDER — PANTOPRAZOLE SODIUM 40 MG IV SOLR
40.0000 mg | INTRAVENOUS | Status: DC
  Administered 2022-06-23: 40 mg via INTRAVENOUS
  Filled 2022-06-23: qty 10

## 2022-06-23 NOTE — Progress Notes (Signed)
NAME:  Desiree Stone, MRN:  [id number], DOB:  21-May-1976, LOS: 8 ADMISSION DATE:  06/06/2022, CHIEF COMPLAINT:  Respiratory Failure   Brief Pt Description / Synopsis:  47 year old female admitted with acute hypoxic and hypercapnic respiratory failure in the setting of acute COPD exacerbation, new onset CHF, and suspected community-acquired pneumonia requiring intubation and mechanical ventilation.   History of Present Illness:  47 yo F presenting to [facility] ED from home with complaints of increasing shortness of breath. History provided per chart review, some items confirmed with her mother via phone interview. Patient reported on arrival to increased dyspnea over the past few months, worsening acutely in the last couple of days. She stated she could hardly walk down the hallway at the school and had to stop and rest. She denied outpatient oxygen use and has been unable to use her CPAP as she is unable to sleep in her bed. She instead has been sleeping in her chair for the past several weeks. She endorsed continued cigarette smoking. In addition she reported a productive cough with thick sputum and palpitations, as well as lower extremity edema. She denied difficulty swallowing, increased abdominal distention, or heart failure history. 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 No evidence of pneumonia or pulmonary edema. Bronchitis/reactive airway disease, likely chronic. Borderline cardiomegaly CT soft tissue neck w contrast 06/06/22: no acute finding in the neck CT angio chest PE 06/06/22: no large central PE. The distal pulmonary arteries are not well evaluated due to the patient body habitus. No other evidence of acute cardiopulmonary pathology. Cardiomegaly with enlarged pulmonary artery suggesting pulmonary hypertension and refluxed contrast into the IVC, suggesting right heart dysfunction. Left adrenal nodule has been present  since 2012 consistent with a benign adenoma.   PCCM consulted for admission due to worsening acute respiratory failure suspect multifocal secondary to acute new onset heart failure & suspected COPD in the setting of suspected pulmonary hypertension requiring emergent intubation and mechanical ventilatory support.   Please see "significant hospital events" section below for full detailed hospital course.  Pertinent  Medical History    Asthma     Diabetes mellitus without complication ([place])     Hypertension     Significant Hospital Events: Including procedures, antibiotic start and stop dates in addition to other pertinent events   06/07/22: Admit to ICU with worsening acute respiratory failure suspect secondary to acute new onset heart failure int he setting of suspected pulmonary hypertension & chronic COPD, OSA, asthma requiring emergent intubation and mechanical ventilatory support.  06/08/22- patient is s/p SBT with severe secretions, she did diurese >800cc today with lasix challenge, CXR with progressive pleural effusions. She has family coming in today. She's uisng OG and weve tranistioned to prednisone taper from solumedrol. Adding aldactone per tube. Iron workup today.  06/09/22-on minimal vent support, will attempt SBT.  Tracheal aspirate growing gram-positive cocci, ceftriaxone added 1/20 extubated and failed, re-intubated 1/21 severe COPD, remains on vent 1/22 severe COPD, remains on vent 1/23 severe upper airway swelling, ENT consulted 1/24 severe resp failure 1/26: Remains on vent, hemodynamically stable, tentative plan for Trach next Monday 1/29. 1/27: Remains on vent, no acute changes or events, awaiting Trach on Monday. D/C Solumedrol  1/28: Remains on vent, no acute changes.  BP soft, Lasix decreased to 40 mg daily. Low grade temperature overnight (T max 100), Obtain CT Chest 1/29 plan for [facility] TODAY CT CHEST MULTIFOCAL PNEUMONIA 1/30  s/p trach, remains on vent 1/31 severe  agitation overnight, febrile episodes 2/1 ventilated  Interim History / Subjective:  Follows simple commands  Objective   Blood pressure 126/67, pulse (!) 117, temperature 100.3 F (37.9 C), temperature source Axillary, resp. rate (!) 26, height '4\' 11"'$  (1.499 m), weight 130.5 kg, last menstrual period 05/30/2022, SpO2 94 %.    Vent Mode: PRVC FiO2 (%):  [40 %-45 %] 40 % Set Rate:  [24 bmp] 24 bmp Vt Set:  [380 mL] 380 mL PEEP:  [8 cmH20] 8 cmH20 Plateau Pressure:  [21 cmH20] 21 cmH20   Intake/Output Summary (Last 24 hours) at 06/23/2022 7517 Last data filed at 06/23/2022 0900 Gross per 24 hour  Intake 3761.92 ml  Output 1185 ml  Net 2576.92 ml   Filed Weights   06/18/22 0431 06/19/22 0500 06/20/22 0417  Weight: 124.6 kg 130.5 kg 130.5 kg    Examination: Physical Exam Constitutional:      General: She is not in acute distress.    Appearance: She is obese. She is not ill-appearing.  Neck:     Comments: Tracheostomy tube in place Cardiovascular:     Rate and Rhythm: Normal rate and regular rhythm.     Pulses: Normal pulses.     Heart sounds: Normal heart sounds.  Pulmonary:     Comments: Ventilated breath sounds Abdominal:     General: There is distension.  Neurological:     Mental Status: She is disoriented.     Comments: Follows commands      Assessment & Plan:   Neurology #Toxic Metabolic Encephalopathy  Adjust sedation for goal RASS of 0. On fentanyl and propofol that we will discontinue this morning. Follows commands intermittently.  Cardiovascular #HFpEF #Demand Ischemia  Decompensated heart failure on presentation with improvement following diuresis. Volume status remains overall positive over the past few days.   -Gentle diuresis to maintain neutral I/O balance  Pulmonary #Acute Hypoxic and Hypercapnic Respiratory Failure #COPD/Asthma Exacerbation #HAP #OSA on CPAP  Respiratory failure in the setting of decompensated heart failure and bilateral  lower lobe consolidations consistent with pneumonia. She also has a history of asthma and was treated for that. Intubated and failed weaning trials, now s/p tracheostomy tube placement. Will attempt SBT with 5/8 pressure support followed by attempt at trach collar if tolerated.  -SBT -trach collar once able -head of bead elevation -goal SpO2 88 to 92%  Gastrointestinal  On tube feeds, pantoprazole for PUD prophylaxis. Appreciate input from dietary given difficulty in controlling glucose with insulin gtt.  Renal Kidney function at baseline, gentle diuresis for goal I/O of zero.  -monitor electrolytes daily  Endocrine  ICU glycemic protocol  Hem/Onc #Anemia  Monitor H/H daily, DVT prophylaxis  ID #HAP  Continues to have low grade fevers. Workup has included duplex of the extremities that is negative. Cultures sent and are pending, no growth to date. Patient has recently finished a course of antibiotics. Foley removed yesterday, medications reviewed (precedex previously discontinued, famotidine discontinued today).  -monitor fever curve   Best Practice (right click and "Reselect all SmartList Selections" daily)   Diet/type: tubefeeds DVT prophylaxis: LMWH GI prophylaxis: PPI Lines: Central line Foley:  N/A Code Status:  full code Last date of multidisciplinary goals of care discussion [06/23/2022]  Labs   CBC: Recent Labs  Lab 06/19/22 0223 06/20/22 0352 06/21/22 0431 06/22/22 0438 06/23/22 0441  WBC 12.7* 19.0* 15.4* 17.6* 22.3*  HGB 9.8* 10.3* 8.9* 10.1* 9.1*  HCT 37.8 40.0 35.7*  39.7 34.3*  MCV 78.4* 76.8* 76.8* 77.4* 77.6*  PLT 254 283 260 290 824    Basic Metabolic Panel: Recent Labs  Lab 06/17/22 0313 06/18/22 0359 06/19/22 0223 06/20/22 0352 06/21/22 0431 06/22/22 0438 06/22/22 1709 06/23/22 0441  NA 140   < > 137 134* 142 140  --  136  K 4.0   < > 3.7 4.3 4.0 3.3* 4.4 4.1  CL 97*   < > 95* 93* 100 101  --  102  CO2 27   < > 32 34* 37* 31  --   29  GLUCOSE 206*   < > 189* 280* 234* 244*  --  202*  BUN 24*   < > 20 25* 22* 23*  --  16  CREATININE 0.52   < > 0.38* 0.54 0.53 0.61  --  0.46  CALCIUM 8.3*   < > 8.2* 8.3* 8.6* 8.4*  --  7.6*  MG 2.3   < > 2.3 2.3 2.0 2.3  --  2.0  PHOS 3.5  --   --  2.7 2.5 4.6  --  3.2   < > = values in this interval not displayed.   GFR: Estimated Creatinine Clearance: 106 mL/min (by C-G formula based on SCr of 0.46 mg/dL). Recent Labs  Lab 06/20/22 0352 06/21/22 0431 06/22/22 0438 06/23/22 0441  PROCALCITON  --  <0.10  --   --   WBC 19.0* 15.4* 17.6* 22.3*    Liver Function Tests: Recent Labs  Lab 06/17/22 0313 06/22/22 0438 06/23/22 0441  ALBUMIN 3.1* 2.9* 2.4*   No results for input(s): "LIPASE", "AMYLASE" in the last 168 hours. No results for input(s): "AMMONIA" in the last 168 hours.  ABG    Component Value Date/Time   PHART 7.42 06/07/2022 0828   PCO2ART 60 (H) 06/07/2022 0828   PO2ART 68 (L) 06/07/2022 0828   HCO3 38.9 (H) 06/07/2022 0828   O2SAT 95.7 06/07/2022 0828     Coagulation Profile: Recent Labs  Lab 06/18/22 0359  INR 1.1    Cardiac Enzymes: No results for input(s): "CKTOTAL", "CKMB", "CKMBINDEX", "TROPONINI" in the last 168 hours.  HbA1C: Hgb A1c MFr Bld  Date/Time Value Ref Range Status  06/07/2022 04:54 AM 9.0 (H) 4.8 - 5.6 % Final    Comment:    (NOTE) Pre diabetes:          5.7%-6.4%  Diabetes:              >6.4%  Glycemic control for   <7.0% adults with diabetes     CBG: Recent Labs  Lab 06/23/22 0441 06/23/22 0544 06/23/22 0723 06/23/22 0821 06/23/22 0919  GLUCAP 230* 202* 202* 218* 196*    Past Medical History:  She,  has a past medical history of Asthma, Diabetes mellitus without complication ([place]), and Hypertension.   Surgical History:   Past Surgical History:  Procedure Laterality Date   carpel tunnel     COLONOSCOPY WITH PROPOFOL N/A 12/08/2014   Procedure: COLONOSCOPY WITH PROPOFOL;  Surgeon: Lollie Sails,  MD;  Location: [facility] ENDOSCOPY;  Service: Endoscopy;  Laterality: N/A;   ESOPHAGOGASTRODUODENOSCOPY     TRACHEOSTOMY TUBE PLACEMENT N/A 06/19/2022   Procedure: TRACHEOSTOMY;  Surgeon: Beverly Gust, MD;  Location: [facility] ORS;  Service: ENT;  Laterality: N/A;     Social History:   reports that she quit smoking about 5 years ago. Her smoking use included cigarettes. She has never used smokeless tobacco. She reports that she does not  drink alcohol and does not use drugs.   Family History:  Her family history includes Breast cancer (age of onset: 64) in her paternal aunt; Cancer in her paternal grandmother.   Allergies Allergies  Allergen Reactions   Abilify [Aripiprazole] Other (See Comments)    Syncope   Effexor [Venlafaxine] Other (See Comments)    Hair Loss   Wellbutrin [Bupropion] Other (See Comments)    Acne      Home Medications  Prior to Admission medications   Medication Sig Start Date End Date Taking? Authorizing Provider  albuterol (PROVENTIL HFA;VENTOLIN HFA) 108 (90 BASE) MCG/ACT inhaler Inhale into the lungs every 6 (six) hours as needed for wheezing or shortness of breath.   Yes [provider]  atorvastatin (LIPITOR) 40 MG tablet Take 40 mg by mouth at bedtime. 05/06/20  Yes [provider]  cetirizine (ZYRTEC) 10 MG tablet Take 10 mg by mouth daily.   Yes [provider]  clindamycin (CLEOCIN T) 1 % external solution Apply 1 Application topically 2 (two) times daily.   Yes [provider]  cyanocobalamin 1000 MCG tablet Take 1,000 mcg by mouth daily.   Yes [provider]  enalapril (VASOTEC) 20 MG tablet Take 20 mg by mouth daily.   Yes [provider]  famotidine (PEPCID) 20 MG tablet Take 20 mg by mouth 2 (two) times daily.   Yes [provider]  fluticasone (FLOVENT HFA) 110 MCG/ACT inhaler Inhale 2 puffs into the lungs 2 (two) times daily.   Yes [provider]  hydrochlorothiazide  (HYDRODIURIL) 25 MG tablet Take 25 mg by mouth daily.   Yes [provider]  hydrOXYzine (ATARAX) 25 MG tablet Take 25 mg by mouth 3 (three) times daily as needed.   Yes [provider]  ipratropium (ATROVENT HFA) 17 MCG/ACT inhaler Inhale 2 puffs into the lungs every 4 (four) hours as needed for wheezing.   Yes [provider]  liraglutide (VICTOZA) 18 MG/3ML SOPN Inject 2.4 mg into the skin daily.   Yes [provider]  metFORMIN (GLUCOPHAGE-XR) 500 MG 24 hr tablet Take 1,000 mg by mouth 2 (two) times daily with a meal.   Yes [provider]     Critical care time: 44 minutes    Armando Reichert, MD [facility] 06/23/2022 2:57 PM

## 2022-06-23 NOTE — Consult Note (Signed)
PHARMACY CONSULT NOTE - ELECTROLYTES  Pharmacy Consult for Electrolyte Monitoring and Replacement   Recent Labs: Potassium (mmol/L)  Date Value  06/23/2022 4.1   Magnesium (mg/dL)  Date Value  06/23/2022 2.0   Calcium (mg/dL)  Date Value  06/23/2022 7.6 (L)   Albumin (g/dL)  Date Value  06/23/2022 2.4 (L)   Phosphorus (mg/dL)  Date Value  06/23/2022 3.2   Sodium (mmol/L)  Date Value  06/23/2022 136   Assessment  Desiree Stone is a 47 y.o. female presenting with AHRF. PMH significant for asthma, T2DM, HTN, OSA, tobacco use (32 pack-years), obesity. Pharmacy has been consulted to monitor and replace electrolytes.  Diet/feeding: Tube Feeds-Vital High Protein 19m/hr, FW [id number]  Pertinent medications: furosemide IV 40 mg daily, insulin infusion per hyperglycemia protocol (started 1/31 overnight), propofol (triglycerides: 115 mg/dL on 2/1), fentanyl infusion  Goal of Therapy: Electrolytes WNL  Plan:  - No replacement warranted - Will recheck electrolytes with AM labs  Thank you for allowing pharmacy to be a part of this patient's care.  CaGlean SalvoPharmD, BCPS Clinical Pharmacist  06/23/2022 7:59 AM

## 2022-06-23 NOTE — Progress Notes (Addendum)
Nutrition Follow-up  DOCUMENTATION CODES:   Morbid obesity  INTERVENTION:   -Continue TF via NGT:   Vital 1.5 @ 55 ml/hr  60 ml Prousrce TF daily    Free water flushes 68m q4 hours to maintain tube patency    Regimen provides 2060 kcal/day, 109g/day protein and 1008 ml/day of free water. Total free water: 1308 ml daily   Daily weights    Continue 60 ml Banatrol BID  -Messaged DM coordinator for further recommendations for glycemic control  NUTRITION DIAGNOSIS:   Inadequate oral intake related to inability to eat (pt sedated and ventilated) as evidenced by NPO status.  Ongoing  GOAL:   Provide needs based on ASPEN/SCCM guidelines  Met with TF  MONITOR:   Vent status, Labs, Weight trends, TF tolerance, I & O's, Skin  REASON FOR ASSESSMENT:   Ventilator    ASSESSMENT:   47y/o female with h/o OSA, HTN, DM, asthma, morbid obesity and uterine fibroid who is admitted with new COPD and CHF. Pt with new PNA.  1/29- s/p trach   Patient is currently intubated on ventilator support MV: 12.6 L/min Temp (24hrs), Avg:100.7 F (38.2 C), Min:100.2 F (37.9 C), Max:101.5 F (38.6 C)  Reviewed I/O's: +2.4 L x 24 hours and +7.2 L since 06/09/22  UOP: 1.2 L x 24 hours  Rectal tube output: 90 ml x 24 hours   Case discussed with RN, MD and during ICU rounds. Propofol and fentanyl off. Pt is waking up more. Per RN, CT of abdomen has been completed; plan for IR consult for g-tube placement. MD shares plan for trach collar trial today.   Per RN, pt remains on IV insulin drip with high insulin requirements. RN and MD reconsulting RD to re-evaluate TF for glycemic control. RD also messaged D coordinator via secure chat.   Reviewed wt hx; pt has experienced a 3.3% wt loss over the past week, which is significant for time frame.   Medications reviewed and include lasix.   Labs reviewed: CBGS: 1174-081(inpatient orders for glycemic control are IV insulin drip).   NUTRITION  - FOCUSED PHYSICAL EXAM:  Flowsheet Row Most Recent Value  Orbital Region No depletion  Upper Arm Region No depletion  Thoracic and Lumbar Region No depletion  Buccal Region No depletion  Temple Region No depletion  Clavicle Bone Region No depletion  Clavicle and Acromion Bone Region No depletion  Scapular Bone Region No depletion  Dorsal Hand No depletion  Patellar Region No depletion  Anterior Thigh Region No depletion  Posterior Calf Region No depletion  Edema (RD Assessment) Moderate  Hair Reviewed  Eyes Reviewed  Mouth Reviewed  Skin Reviewed  Nails Reviewed       Diet Order:   Diet Order             Diet NPO time specified  Diet effective midnight                   EDUCATION NEEDS:   No education needs have been identified at this time  Skin:  Skin Assessment: Skin Integrity Issues: Skin Integrity Issues:: Incisions Incisions: closed neck s/p trach placement  Last BM:  06/23/22 (type 7 via rectal tube)  Height:   Ht Readings from Last 1 Encounters:  06/08/22 '4\' 11"'$  (1.499 m)    Weight:   Wt Readings from Last 1 Encounters:  06/20/22 130.5 kg    Ideal Body Weight:  44.5 kg  BMI:  Body mass index is  58.11 kg/m.  Estimated Nutritional Needs:   Kcal:  3524-8185  Protein:  100-115 grams  Fluid:  > 1.8 L    [name], RD, LDN, [place] Registered Dietitian II Certified Diabetes Care and Education Specialist Please refer to [facility] for RD and/or RD on-call/weekend/after hours pager

## 2022-06-24 DIAGNOSIS — E119 Type 2 diabetes mellitus without complications: Secondary | ICD-10-CM | POA: Diagnosis not present

## 2022-06-24 DIAGNOSIS — J189 Pneumonia, unspecified organism: Secondary | ICD-10-CM

## 2022-06-24 DIAGNOSIS — J9602 Acute respiratory failure with hypercapnia: Secondary | ICD-10-CM | POA: Diagnosis not present

## 2022-06-24 DIAGNOSIS — J9601 Acute respiratory failure with hypoxia: Secondary | ICD-10-CM | POA: Diagnosis not present

## 2022-06-24 LAB — GLUCOSE, CAPILLARY
Glucose-Capillary: 110 mg/dL — ABNORMAL HIGH (ref 70–99)
Glucose-Capillary: 128 mg/dL — ABNORMAL HIGH (ref 70–99)
Glucose-Capillary: 129 mg/dL — ABNORMAL HIGH (ref 70–99)
Glucose-Capillary: 129 mg/dL — ABNORMAL HIGH (ref 70–99)
Glucose-Capillary: 143 mg/dL — ABNORMAL HIGH (ref 70–99)
Glucose-Capillary: 143 mg/dL — ABNORMAL HIGH (ref 70–99)
Glucose-Capillary: 145 mg/dL — ABNORMAL HIGH (ref 70–99)
Glucose-Capillary: 145 mg/dL — ABNORMAL HIGH (ref 70–99)
Glucose-Capillary: 149 mg/dL — ABNORMAL HIGH (ref 70–99)
Glucose-Capillary: 154 mg/dL — ABNORMAL HIGH (ref 70–99)
Glucose-Capillary: 155 mg/dL — ABNORMAL HIGH (ref 70–99)
Glucose-Capillary: 157 mg/dL — ABNORMAL HIGH (ref 70–99)
Glucose-Capillary: 162 mg/dL — ABNORMAL HIGH (ref 70–99)
Glucose-Capillary: 172 mg/dL — ABNORMAL HIGH (ref 70–99)
Glucose-Capillary: 172 mg/dL — ABNORMAL HIGH (ref 70–99)
Glucose-Capillary: 174 mg/dL — ABNORMAL HIGH (ref 70–99)
Glucose-Capillary: 188 mg/dL — ABNORMAL HIGH (ref 70–99)
Glucose-Capillary: 197 mg/dL — ABNORMAL HIGH (ref 70–99)
Glucose-Capillary: 84 mg/dL (ref 70–99)

## 2022-06-24 LAB — RENAL FUNCTION PANEL
Albumin: 2.6 g/dL — ABNORMAL LOW (ref 3.5–5.0)
Albumin: 2.5 g/dL — ABNORMAL LOW (ref 3.5–5.0)
Anion gap: 6 (ref 5–15)
Anion gap: 8 (ref 5–15)
BUN: 20 mg/dL (ref 6–20)
BUN: 20 mg/dL (ref 6–20)
CO2: 30 mmol/L (ref 22–32)
CO2: 30 mmol/L (ref 22–32)
Calcium: 8.6 mg/dL — ABNORMAL LOW (ref 8.9–10.3)
Calcium: 8.3 mg/dL — ABNORMAL LOW (ref 8.9–10.3)
Chloride: 104 mmol/L (ref 98–111)
Chloride: 103 mmol/L (ref 98–111)
Creatinine, Ser: 0.49 mg/dL (ref 0.44–1.00)
Creatinine, Ser: 0.45 mg/dL (ref 0.44–1.00)
GFR, Estimated: >60 mL/min (ref >60)
GFR, Estimated: >60 mL/min (ref >60)
Glucose, Bld: 186 mg/dL — ABNORMAL HIGH (ref 70–99)
Glucose, Bld: 143 mg/dL — ABNORMAL HIGH (ref 70–99)
Phosphorus: 2.5 mg/dL (ref 2.5–4.6)
Phosphorus: 3.5 mg/dL (ref 2.5–4.6)
Potassium: 3.9 mmol/L (ref 3.5–5.1)
Potassium: 3.9 mmol/L (ref 3.5–5.1)
Sodium: 140 mmol/L (ref 135–145)
Sodium: 141 mmol/L (ref 135–145)

## 2022-06-24 LAB — TRIGLYCERIDES: Triglycerides: 97 mg/dL (ref <150)

## 2022-06-24 LAB — MAGNESIUM: Magnesium: 2.2 mg/dL (ref 1.7–2.4)

## 2022-06-24 LAB — CBC
HCT: 36 % (ref 36.0–46.0)
Hemoglobin: 9.5 g/dL — ABNORMAL LOW (ref 12.0–15.0)
MCH: 19.9 pg — ABNORMAL LOW (ref 26.0–34.0)
MCHC: 26.4 g/dL — ABNORMAL LOW (ref 30.0–36.0)
MCV: 75.5 fL — ABNORMAL LOW (ref 80.0–100.0)
Platelets: 305 10*3/uL (ref 150–400)
RBC: 4.77 MIL/uL (ref 3.87–5.11)
RDW: 26.7 % — ABNORMAL HIGH (ref 11.5–15.5)
WBC: 20.4 10*3/uL — ABNORMAL HIGH (ref 4.0–10.5)
nRBC: 0.1 % (ref 0.0–0.2)

## 2022-06-24 MED ORDER — FUROSEMIDE 10 MG/ML IJ SOLN
40.0000 mg | Freq: Two times a day (BID) | INTRAMUSCULAR | Status: DC
  Administered 2022-06-24 – 2022-07-04 (×21): 40 mg via INTRAVENOUS
  Filled 2022-06-24 (×21): qty 4

## 2022-06-24 NOTE — Evaluation (Signed)
Passy-Muir Speaking Valve - Evaluation Patient Details  Name: Desiree Stone MRN: [id number] Date of Birth: 28-Nov-1975  Today's Date: 06/24/2022 Time: 1150-1230 SLP Time Calculation (min) (ACUTE ONLY): 40 min  Past Medical History:  Past Medical History:  Diagnosis Date   Asthma    Diabetes mellitus without complication ([place])    Hypertension    Past Surgical History:  Past Surgical History:  Procedure Laterality Date   carpel tunnel     COLONOSCOPY WITH PROPOFOL N/A 12/08/2014   Procedure: COLONOSCOPY WITH PROPOFOL;  Surgeon: Lollie Sails, MD;  Location: [facility] ENDOSCOPY;  Service: Endoscopy;  Laterality: N/A;   ESOPHAGOGASTRODUODENOSCOPY     TRACHEOSTOMY TUBE PLACEMENT N/A 06/19/2022   Procedure: TRACHEOSTOMY;  Surgeon: Beverly Gust, MD;  Location: [facility] ORS;  Service: ENT;  Laterality: N/A;   HPI:  Pt is a 47 year old female admitted with acute hypoxic and hypercapnic respiratory failure in the setting of acute COPD exacerbation, new onset CHF, Obesity, ongoing tobacco use, and suspected community-acquired pneumonia.  She endorsed that over the last several months, she has been experiencing gradually worsening shortness of breath and dyspnea on exertion in addition to orthopnea. In addition, she endorses a productive cough with thick sputum and palpitations.  Post admission, she required intubation and mechanical ventilation d/t ongoing Pulmonary decline.  Pt was orally intubated from 06/08/22-06/19/22 when tracheostomy was performed. Pt s/p tracheostomy; NGT remains in place.   CXR on 06/22/22: Bilateral mid and lower lung interstitial thickening with  left-greater-than-right basilar heterogeneous airspace opacities,  similar to prior. Findings could represent atelectasis or pneumonia.  3. Small left pleural effusion.    Assessment / Plan / Recommendation  Clinical Impression   Pt seen for PMV evaluation today, for toleration of PMV wear/use for verbal communication w/ others and  to promote Pulmonary strengthening. Pt was awake, answered basic Y/N questions via nonverbal means(head nod/shake) w/ fairly good accuracy and followed basic 1-step commands appropriately. She sometimes nodded to general conversation ongoing. Pt indicated some stomach discomfort -- NSG aware and the NGT TFs were on Hold.  Pt continues on TC wean today(~3 hours this morning) per RT/NSG. She tolerated 4+ hours yesterday per NSG report. Cuff deflated at baseline while on TC w/ improved secretions, per RT/NSG.       Shiley XLT #8; cuffed w/ cuff deflated currently. HR 102, RR 26, O2 sats 96%. Site mostly dry w/ minimal secretions noted.  FiO2 40%; 10L.   Explained the use and wear of the PMV to pt; trach and stoma area inspected; ensured Cuff was deflated. Initiated w/ finger occlusion x3 -- pt was unable to redirect air superiorly to phonate, however, timing of trach occlusion to pt's inhalation was difficult. Noted much condensation at the level of the trach collar, and airflow at the level of the trach. PMV was then attempted. It was placed w/out difficulty. However, noted quick change in breathing and inability to exhale w/ breathing-stacking noted. The PMV was removed. This was attempted again  w/ same results and pt forcing off the PMV w/ a burst of air at the level of the trach. Pt indicated discomfort w/ a head nod and her facial expression. PMV placement attempts stopped.  No significant changes in RR: 27-30, O2 sats 96%, HR 105.       Pt does Not appear to tolerate PMV placement at this time. She appears unable to redirect airflow Superiorly for phonation and breathing when PMV is placed. Discomfort is noted. Suspect potential factors including  the current trach size of a Shiley XLT #8 as well as recent surgical placement of trach (edema?) on 06/19/22. Trach downsize could be beneficial for more comfortable PMV wear/use and for increased breath support for verbal communication w/ others when this is  possible per ENT. The above was discussed w/ pt and MD/NSG.   PMV wear/use is No recommended at this time. MD updated, agreed. ST services will continue to monitor pt's status while admitted and readiness for [facility] use/wear; education. Will maintain communication w/ MDs re: timing of downsizing of trach.  SLP Visit Diagnosis: Aphonia (R49.1) (tracheostomy)    SLP Assessment  Patient needs continued Speech Lanaguage Pathology Services    Recommendations for follow up therapy are one component of a multi-disciplinary discharge planning process, led by the attending physician.  Recommendations may be updated based on patient status, additional functional criteria and insurance authorization.  Follow Up Recommendations  Skilled nursing-short term rehab (<3 hours/day) (TBD)    Assistance Recommended at Discharge Frequent or constant Supervision/Assistance  Functional Status Assessment Patient has had a recent decline in their functional status and demonstrates the ability to make significant improvements in function in a reasonable and predictable amount of time.  Frequency and Duration min 2x/week  2 weeks    PMSV Trial PMSV was placed for: 10 seconds x2 Able to redirect subglottic air through upper airway: No Able to Attain Phonation: No Able to Expectorate Secretions: No attempts Breath Support for Phonation: Inadequate (none) Intelligibility: Unable to assess (comment) Respirations During Trial: 26 SpO2 During Trial: 96 % Pulse During Trial: 102 Behavior: Alert;Cooperative;Controlled;Good eye contact;Responsive to questions   Tracheostomy Tube  Additional Tracheostomy Tube Assessment Trach Collar Period: ~3 hours this morning per NSG Secretion Description: slight-min Frequency of Tracheal Suctioning: PRN Level of Secretion Expectoration: Tracheal    Vent Dependency  Vent Dependent: No FiO2 (%): 40 %    Cuff Deflation Trial Tolerated Cuff Deflation: Yes Length of Time for Cuff  Deflation Trial: at baseline Behavior: Alert;Cooperative;Expresses self well;Good eye contact;Responsive to questions Cuff Deflation Trial - Comments: cuff deflated at baseline         Briseida Gittings 06/24/2022, 1:02 PM Orinda Kenner, MS, CCC-SLP Speech Language Pathologist Rehab Services; [place] [phone] (ascom)

## 2022-06-24 NOTE — Progress Notes (Signed)
NAME:  Desiree Stone, MRN:  [id number], DOB:  May 03, 1976, LOS: 36 ADMISSION DATE:  06/06/2022, CHIEF COMPLAINT:  Respiratory Failure   Brief Pt Description / Synopsis:  47 year old female admitted with acute hypoxic and hypercapnic respiratory failure in the setting of acute COPD exacerbation, new onset CHF, and suspected community-acquired pneumonia requiring intubation and mechanical ventilation.   History of Present Illness:  47 yo F presenting to [facility] ED from home with complaints of increasing shortness of breath. History provided per chart review, some items confirmed with her mother via phone interview. Patient reported on arrival to increased dyspnea over the past few months, worsening acutely in the last couple of days. She stated she could hardly walk down the hallway at the school and had to stop and rest. She denied outpatient oxygen use and has been unable to use her CPAP as she is unable to sleep in her bed. She instead has been sleeping in her chair for the past several weeks. She endorsed continued cigarette smoking. In addition she reported a productive cough with thick sputum and palpitations, as well as lower extremity edema. She denied difficulty swallowing, increased abdominal distention, or heart failure history. 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 No evidence of pneumonia or pulmonary edema. Bronchitis/reactive airway disease, likely chronic. Borderline cardiomegaly CT soft tissue neck w contrast 06/06/22: no acute finding in the neck CT angio chest PE 06/06/22: no large central PE. The distal pulmonary arteries are not well evaluated due to the patient body habitus. No other evidence of acute cardiopulmonary pathology. Cardiomegaly with enlarged pulmonary artery suggesting pulmonary hypertension and refluxed contrast into the IVC, suggesting right heart dysfunction. Left adrenal nodule has been present  since 2012 consistent with a benign adenoma.   PCCM consulted for admission due to worsening acute respiratory failure suspect multifocal secondary to acute new onset heart failure & suspected COPD in the setting of suspected pulmonary hypertension requiring emergent intubation and mechanical ventilatory support.   Please see "significant hospital events" section below for full detailed hospital course.  Pertinent  Medical History    Asthma     Diabetes mellitus without complication ([place])     Hypertension     Significant Hospital Events: Including procedures, antibiotic start and stop dates in addition to other pertinent events   06/07/22: Admit to ICU with worsening acute respiratory failure suspect secondary to acute new onset heart failure int he setting of suspected pulmonary hypertension & chronic COPD, OSA, asthma requiring emergent intubation and mechanical ventilatory support.  06/08/22- patient is s/p SBT with severe secretions, she did diurese >800cc today with lasix challenge, CXR with progressive pleural effusions. She has family coming in today. She's uisng OG and weve tranistioned to prednisone taper from solumedrol. Adding aldactone per tube. Iron workup today.  06/09/22-on minimal vent support, will attempt SBT.  Tracheal aspirate growing gram-positive cocci, ceftriaxone added 1/20 extubated and failed, re-intubated 1/21 severe COPD, remains on vent 1/22 severe COPD, remains on vent 1/23 severe upper airway swelling, ENT consulted 1/24 severe resp failure 1/26: Remains on vent, hemodynamically stable, tentative plan for Trach next Monday 1/29. 1/27: Remains on vent, no acute changes or events, awaiting Trach on Monday. D/C Solumedrol  1/28: Remains on vent, no acute changes.  BP soft, Lasix decreased to 40 mg daily. Low grade temperature overnight (T max 100), Obtain CT Chest 1/29 plan for [facility] TODAY CT CHEST MULTIFOCAL PNEUMONIA 1/30  s/p trach, remains on vent 1/31 severe  agitation overnight, febrile episodes 2/1 ventilated 2/2 tolerated trach collar throughout the day  Interim History / Subjective:  Follows simple commands  Objective   Blood pressure 129/77, pulse (!) 110, temperature 99.6 F (37.6 C), temperature source Axillary, resp. rate (!) 27, height '4\' 11"'$  (1.499 m), weight 130.5 kg, last menstrual period 05/30/2022, SpO2 95 %.    Vent Mode: PSV FiO2 (%):  [28 %-40 %] 35 % PEEP:  [5 cmH20] 5 cmH20 Pressure Support:  [5 cmH20-8 cmH20] 8 cmH20 Plateau Pressure:  [21 cmH20] 21 cmH20   Intake/Output Summary (Last 24 hours) at 06/24/2022 0730 Last data filed at 06/24/2022 0645 Gross per 24 hour  Intake 2708.5 ml  Output 1240 ml  Net 1468.5 ml    Filed Weights   06/18/22 0431 06/19/22 0500 06/20/22 0417  Weight: 124.6 kg 130.5 kg 130.5 kg    Examination: Physical Exam Constitutional:      General: She is not in acute distress.    Appearance: She is obese. She is not ill-appearing.  Neck:     Comments: Tracheostomy tube in place Cardiovascular:     Rate and Rhythm: Normal rate and regular rhythm.     Pulses: Normal pulses.     Heart sounds: Normal heart sounds.  Pulmonary:     Comments: Ventilated breath sounds Abdominal:     General: There is distension.  Neurological:     Comments: Awake and alert, follows commands      Assessment & Plan:   Neurology #Toxic Metabolic Encephalopathy  Off sedation since yesterday, awake, alert and follows commands.  Cardiovascular #HFpEF #Demand Ischemia  Decompensated heart failure on presentation with improvement following diuresis. Volume status remains overall positive over the past few days.   -maintain neutral I/O balance, increase furosemide to bid  Pulmonary #Acute Hypoxic and Hypercapnic Respiratory Failure #COPD/Asthma Exacerbation #HAP #OSA on CPAP  Respiratory failure in the setting of decompensated heart failure and bilateral lower lobe consolidations consistent with  pneumonia. She also has a history of asthma and was treated for that. Intubated and failed weaning trials, now s/p tracheostomy tube placement. Tolerated PSV followed by trach collar yesterday, will re-attempt today and proceed with PMV  -trach collar once able -head of bead elevation -goal SpO2 88 to 92%  Gastrointestinal  On tube feeds, pantoprazole for PUD prophylaxis. Appreciate input from dietary given difficulty in controlling glucose with insulin gtt.  -d/c PPI  Renal Kidney function at baseline, gentle diuresis for goal I/O of zero.  -monitor electrolytes daily  Endocrine  ICU glycemic protocol with a gtt. Switch to basal bolus regimen of insulin once glucose levels stable  Hem/Onc #Anemia  Monitor H/H daily, DVT prophylaxis  ID #HAP  Continues to have low grade fevers. Workup has included duplex of the extremities that is negative. Cultures sent and are pending, no growth to date. Patient has recently finished a course of antibiotics. Foley removed yesterday, medications reviewed (precedex previously discontinued, famotidine discontinued today).  -monitor fever curve -Continue Cefepime   Best Practice (right click and "Reselect all SmartList Selections" daily)   Diet/type: tubefeeds DVT prophylaxis: LMWH GI prophylaxis: N/A Lines: Central line Foley:  N/A Code Status:  full code Last date of multidisciplinary goals of care discussion [06/24/2022]  Labs   CBC: Recent Labs  Lab 06/20/22 0352 06/21/22 0431 06/22/22 0438 06/23/22 0441 06/24/22 0531  WBC 19.0* 15.4* 17.6* 22.3* 20.4*  HGB 10.3* 8.9* 10.1* 9.1* 9.5*  HCT  40.0 35.7* 39.7 34.3* 36.0  MCV 76.8* 76.8* 77.4* 77.6* 75.5*  PLT 283 260 290 250 305     Basic Metabolic Panel: Recent Labs  Lab 06/20/22 0352 06/21/22 0431 06/22/22 0438 06/22/22 1709 06/23/22 0441 06/24/22 0531  NA 134* 142 140  --  136 140  K 4.3 4.0 3.3* 4.4 4.1 3.9  CL 93* 100 101  --  102 104  CO2 34* 37* 31  --  29  30  GLUCOSE 280* 234* 244*  --  202* 186*  BUN 25* 22* 23*  --  16 20  CREATININE 0.54 0.53 0.61  --  0.46 0.49  CALCIUM 8.3* 8.6* 8.4*  --  7.6* 8.6*  MG 2.3 2.0 2.3  --  2.0 2.2  PHOS 2.7 2.5 4.6  --  3.2 2.5    GFR: Estimated Creatinine Clearance: 106 mL/min (by C-G formula based on SCr of 0.49 mg/dL). Recent Labs  Lab 06/21/22 0431 06/22/22 0438 06/23/22 0441 06/24/22 0531  PROCALCITON <0.10  --  0.37  --   WBC 15.4* 17.6* 22.3* 20.4*     Liver Function Tests: Recent Labs  Lab 06/22/22 0438 06/23/22 0441 06/24/22 0531  ALBUMIN 2.9* 2.4* 2.6*    No results for input(s): "LIPASE", "AMYLASE" in the last 168 hours. No results for input(s): "AMMONIA" in the last 168 hours.  ABG    Component Value Date/Time   PHART 7.42 06/07/2022 0828   PCO2ART 60 (H) 06/07/2022 0828   PO2ART 68 (L) 06/07/2022 0828   HCO3 38.9 (H) 06/07/2022 0828   O2SAT 95.7 06/07/2022 0828     Coagulation Profile: Recent Labs  Lab 06/18/22 0359  INR 1.1     Cardiac Enzymes: No results for input(s): "CKTOTAL", "CKMB", "CKMBINDEX", "TROPONINI" in the last 168 hours.  HbA1C: Hgb A1c MFr Bld  Date/Time Value Ref Range Status  06/07/2022 04:54 AM 9.0 (H) 4.8 - 5.6 % Final    Comment:    (NOTE) Pre diabetes:          5.7%-6.4%  Diabetes:              >6.4%  Glycemic control for   <7.0% adults with diabetes     CBG: Recent Labs  Lab 06/24/22 0212 06/24/22 0320 06/24/22 0424 06/24/22 0535 06/24/22 0637  GLUCAP 110* 145* 172* 197* 172*     Past Medical History:  She,  has a past medical history of Asthma, Diabetes mellitus without complication ([place]), and Hypertension.   Surgical History:   Past Surgical History:  Procedure Laterality Date   carpel tunnel     COLONOSCOPY WITH PROPOFOL N/A 12/08/2014   Procedure: COLONOSCOPY WITH PROPOFOL;  Surgeon: Lollie Sails, MD;  Location: [facility] ENDOSCOPY;  Service: Endoscopy;  Laterality: N/A;   ESOPHAGOGASTRODUODENOSCOPY      TRACHEOSTOMY TUBE PLACEMENT N/A 06/19/2022   Procedure: TRACHEOSTOMY;  Surgeon: Beverly Gust, MD;  Location: [facility] ORS;  Service: ENT;  Laterality: N/A;     Social History:   reports that she quit smoking about 5 years ago. Her smoking use included cigarettes. She has never used smokeless tobacco. She reports that she does not drink alcohol and does not use drugs.   Family History:  Her family history includes Breast cancer (age of onset: 88) in her paternal aunt; Cancer in her paternal grandmother.   Allergies Allergies  Allergen Reactions   Abilify [Aripiprazole] Other (See Comments)    Syncope   Effexor [Venlafaxine] Other (See Comments)  Hair Loss   Wellbutrin [Bupropion] Other (See Comments)    Acne      Home Medications  Prior to Admission medications   Medication Sig Start Date End Date Taking? Authorizing Provider  albuterol (PROVENTIL HFA;VENTOLIN HFA) 108 (90 BASE) MCG/ACT inhaler Inhale into the lungs every 6 (six) hours as needed for wheezing or shortness of breath.   Yes [provider]  atorvastatin (LIPITOR) 40 MG tablet Take 40 mg by mouth at bedtime. 05/06/20  Yes [provider]  cetirizine (ZYRTEC) 10 MG tablet Take 10 mg by mouth daily.   Yes [provider]  clindamycin (CLEOCIN T) 1 % external solution Apply 1 Application topically 2 (two) times daily.   Yes [provider]  cyanocobalamin 1000 MCG tablet Take 1,000 mcg by mouth daily.   Yes [provider]  enalapril (VASOTEC) 20 MG tablet Take 20 mg by mouth daily.   Yes [provider]  famotidine (PEPCID) 20 MG tablet Take 20 mg by mouth 2 (two) times daily.   Yes [provider]  fluticasone (FLOVENT HFA) 110 MCG/ACT inhaler Inhale 2 puffs into the lungs 2 (two) times daily.   Yes [provider]  hydrochlorothiazide (HYDRODIURIL) 25 MG tablet Take 25 mg by mouth daily.   Yes [provider]  hydrOXYzine (ATARAX) 25 MG  tablet Take 25 mg by mouth 3 (three) times daily as needed.   Yes [provider]  ipratropium (ATROVENT HFA) 17 MCG/ACT inhaler Inhale 2 puffs into the lungs every 4 (four) hours as needed for wheezing.   Yes [provider]  liraglutide (VICTOZA) 18 MG/3ML SOPN Inject 2.4 mg into the skin daily.   Yes [provider]  metFORMIN (GLUCOPHAGE-XR) 500 MG 24 hr tablet Take 1,000 mg by mouth 2 (two) times daily with a meal.   Yes [provider]     Critical care time: 59 minutes    Armando Reichert, MD [facility] 06/24/2022 7:30 AM

## 2022-06-24 NOTE — Plan of Care (Signed)
Patient remains in ICU. Patient requiring mechanical ventilation via trach at this time. Per RN report, patient tolerated ATCTs earlier today. CVC remains in place. Enteric isolation initiated per orders (was not previously initiated) pending Norovirus panel.   Problem: Education: Goal: Ability to describe self-care measures that may prevent or decrease complications (Diabetes Survival Skills Education) will improve Outcome: Progressing Goal: Individualized Educational Video(s) Outcome: Progressing   Problem: Coping: Goal: Ability to adjust to condition or change in health will improve Outcome: Progressing   Problem: Fluid Volume: Goal: Ability to maintain a balanced intake and output will improve Outcome: Progressing   Problem: Health Behavior/Discharge Planning: Goal: Ability to identify and utilize available resources and services will improve Outcome: Progressing Goal: Ability to manage health-related needs will improve Outcome: Progressing   Problem: Metabolic: Goal: Ability to maintain appropriate glucose levels will improve Outcome: Not Progressing   Problem: Nutritional: Goal: Maintenance of adequate nutrition will improve Outcome: Not Progressing Goal: Progress toward achieving an optimal weight will improve Outcome: Not Progressing   Problem: Skin Integrity: Goal: Risk for impaired skin integrity will decrease Outcome: Not Progressing   Problem: Tissue Perfusion: Goal: Adequacy of tissue perfusion will improve Outcome: Not Progressing   Problem: Education: Goal: Knowledge of General Education information will improve Description: Including pain rating scale, medication(s)/side effects and non-pharmacologic comfort measures Outcome: Progressing   Problem: Health Behavior/Discharge Planning: Goal: Ability to manage health-related needs will improve Outcome: Progressing   Problem: Clinical Measurements: Goal: Ability to maintain clinical measurements within  normal limits will improve Outcome: Not Progressing Goal: Will remain free from infection Outcome: Not Progressing Goal: Diagnostic test results will improve Outcome: Not Progressing Goal: Respiratory complications will improve Outcome: Not Progressing Goal: Cardiovascular complication will be avoided Outcome: Progressing   Problem: Activity: Goal: Risk for activity intolerance will decrease Outcome: Not Progressing   Problem: Nutrition: Goal: Adequate nutrition will be maintained Outcome: Not Progressing   Problem: Coping: Goal: Level of anxiety will decrease Outcome: Progressing   Problem: Elimination: Goal: Will not experience complications related to bowel motility Outcome: Not Progressing Goal: Will not experience complications related to urinary retention Outcome: Progressing   Problem: Pain Managment: Goal: General experience of comfort will improve Outcome: Progressing   Problem: Safety: Goal: Ability to remain free from injury will improve Outcome: Progressing   Problem: Skin Integrity: Goal: Risk for impaired skin integrity will decrease Outcome: Progressing

## 2022-06-24 NOTE — Consult Note (Signed)
PHARMACY CONSULT NOTE - ELECTROLYTES  Pharmacy Consult for Electrolyte Monitoring and Replacement   Recent Labs: Potassium (mmol/L)  Date Value  06/24/2022 3.9   Magnesium (mg/dL)  Date Value  06/24/2022 2.2   Calcium (mg/dL)  Date Value  06/24/2022 8.6 (L)   Albumin (g/dL)  Date Value  06/24/2022 2.6 (L)   Phosphorus (mg/dL)  Date Value  06/24/2022 2.5   Sodium (mmol/L)  Date Value  06/24/2022 140   Assessment  Desiree Stone is a 47 y.o. female presenting with AHRF. PMH significant for asthma, T2DM, HTN, OSA, tobacco use (32 pack-years), obesity. Pharmacy has been consulted to monitor and replace electrolytes.  Diet/feeding: Tube Feeds-Vital High Protein 23m/hr, FW 559mq4h  Pertinent medications: furosemide IV 40 mg daily, insulin infusion per hyperglycemia protocol (started 1/31 overnight), propofol (triglycerides: 115 mg/dL on 2/1), fentanyl infusion  Goal of Therapy: Electrolytes WNL  Plan:  - No replacement warranted - Will recheck electrolytes with AM labs  Thank you for allowing pharmacy to be a part of this patient's care.  WaPearla DubonnetPharmD Clinical Pharmacist 06/24/2022 7:21 AM

## 2022-06-25 DIAGNOSIS — J9601 Acute respiratory failure with hypoxia: Secondary | ICD-10-CM | POA: Diagnosis not present

## 2022-06-25 DIAGNOSIS — E119 Type 2 diabetes mellitus without complications: Secondary | ICD-10-CM | POA: Diagnosis not present

## 2022-06-25 DIAGNOSIS — I10 Essential (primary) hypertension: Secondary | ICD-10-CM | POA: Diagnosis not present

## 2022-06-25 DIAGNOSIS — G4733 Obstructive sleep apnea (adult) (pediatric): Secondary | ICD-10-CM | POA: Diagnosis not present

## 2022-06-25 LAB — RENAL FUNCTION PANEL
Albumin: 2.4 g/dL — ABNORMAL LOW (ref 3.5–5.0)
Anion gap: 7 (ref 5–15)
BUN: 21 mg/dL — ABNORMAL HIGH (ref 6–20)
CO2: 30 mmol/L (ref 22–32)
Calcium: 8.3 mg/dL — ABNORMAL LOW (ref 8.9–10.3)
Chloride: 104 mmol/L (ref 98–111)
Creatinine, Ser: 0.53 mg/dL (ref 0.44–1.00)
GFR, Estimated: >60 mL/min (ref >60)
Glucose, Bld: 180 mg/dL — ABNORMAL HIGH (ref 70–99)
Phosphorus: 3.4 mg/dL (ref 2.5–4.6)
Potassium: 3.4 mmol/L — ABNORMAL LOW (ref 3.5–5.1)
Sodium: 141 mmol/L (ref 135–145)

## 2022-06-25 LAB — GLUCOSE, CAPILLARY
Glucose-Capillary: 162 mg/dL — ABNORMAL HIGH (ref 70–99)
Glucose-Capillary: 163 mg/dL — ABNORMAL HIGH (ref 70–99)
Glucose-Capillary: 164 mg/dL — ABNORMAL HIGH (ref 70–99)
Glucose-Capillary: 167 mg/dL — ABNORMAL HIGH (ref 70–99)
Glucose-Capillary: 182 mg/dL — ABNORMAL HIGH (ref 70–99)
Glucose-Capillary: 184 mg/dL — ABNORMAL HIGH (ref 70–99)
Glucose-Capillary: 186 mg/dL — ABNORMAL HIGH (ref 70–99)
Glucose-Capillary: 188 mg/dL — ABNORMAL HIGH (ref 70–99)
Glucose-Capillary: 188 mg/dL — ABNORMAL HIGH (ref 70–99)
Glucose-Capillary: 189 mg/dL — ABNORMAL HIGH (ref 70–99)
Glucose-Capillary: 194 mg/dL — ABNORMAL HIGH (ref 70–99)
Glucose-Capillary: 197 mg/dL — ABNORMAL HIGH (ref 70–99)
Glucose-Capillary: 204 mg/dL — ABNORMAL HIGH (ref 70–99)
Glucose-Capillary: 207 mg/dL — ABNORMAL HIGH (ref 70–99)
Glucose-Capillary: 246 mg/dL — ABNORMAL HIGH (ref 70–99)
Glucose-Capillary: 256 mg/dL — ABNORMAL HIGH (ref 70–99)

## 2022-06-25 LAB — CBC
HCT: 34.4 % — ABNORMAL LOW (ref 36.0–46.0)
Hemoglobin: 9.2 g/dL — ABNORMAL LOW (ref 12.0–15.0)
MCH: 19.8 pg — ABNORMAL LOW (ref 26.0–34.0)
MCHC: 26.7 g/dL — ABNORMAL LOW (ref 30.0–36.0)
MCV: 74.1 fL — ABNORMAL LOW (ref 80.0–100.0)
Platelets: 322 10*3/uL (ref 150–400)
RBC: 4.64 MIL/uL (ref 3.87–5.11)
RDW: 26.7 % — ABNORMAL HIGH (ref 11.5–15.5)
WBC: 16.6 10*3/uL — ABNORMAL HIGH (ref 4.0–10.5)
nRBC: 0 % (ref 0.0–0.2)

## 2022-06-25 LAB — TRIGLYCERIDES: Triglycerides: 114 mg/dL (ref <150)

## 2022-06-25 LAB — MAGNESIUM: Magnesium: 2.3 mg/dL (ref 1.7–2.4)

## 2022-06-25 MED ORDER — INSULIN ASPART 100 UNIT/ML IJ SOLN
0.0000 [IU] | Freq: Four times a day (QID) | INTRAMUSCULAR | Status: DC
  Administered 2022-06-25: 5 [IU] via SUBCUTANEOUS
  Administered 2022-06-25 – 2022-06-26 (×2): 3 [IU] via SUBCUTANEOUS
  Filled 2022-06-25 (×3): qty 1

## 2022-06-25 MED ORDER — POTASSIUM CHLORIDE 10 MEQ/50ML IV SOLN
10.0000 meq | INTRAVENOUS | Status: AC
  Administered 2022-06-25 (×3): 10 meq via INTRAVENOUS
  Filled 2022-06-25 (×3): qty 50

## 2022-06-25 MED ORDER — OXYCODONE HCL 5 MG PO TABS
10.0000 mg | ORAL_TABLET | Freq: Four times a day (QID) | ORAL | Status: DC | PRN
  Administered 2022-07-04: 10 mg
  Filled 2022-06-25: qty 2

## 2022-06-25 MED ORDER — POTASSIUM CHLORIDE 20 MEQ PO PACK
40.0000 meq | PACK | Freq: Once | ORAL | Status: AC
  Administered 2022-06-25: 40 meq
  Filled 2022-06-25: qty 2

## 2022-06-25 MED ORDER — INSULIN GLARGINE-YFGN 100 UNIT/ML ~~LOC~~ SOLN
25.0000 [IU] | Freq: Two times a day (BID) | SUBCUTANEOUS | Status: DC
  Administered 2022-06-25 – 2022-06-27 (×5): 25 [IU] via SUBCUTANEOUS
  Filled 2022-06-25 (×5): qty 0.25

## 2022-06-25 NOTE — Plan of Care (Signed)
Patient remains in ICU. On-going TCTs thru AM hours; PSV via trach overnight.    Problem: Education: Goal: Ability to describe self-care measures that may prevent or decrease complications (Diabetes Survival Skills Education) will improve Outcome: Progressing Goal: Individualized Educational Video(s) Outcome: Progressing   Problem: Coping: Goal: Ability to adjust to condition or change in health will improve Outcome: Progressing   Problem: Fluid Volume: Goal: Ability to maintain a balanced intake and output will improve Outcome: Progressing   Problem: Health Behavior/Discharge Planning: Goal: Ability to identify and utilize available resources and services will improve Outcome: Progressing Goal: Ability to manage health-related needs will improve Outcome: Progressing   Problem: Metabolic: Goal: Ability to maintain appropriate glucose levels will improve Outcome: Progressing   Problem: Nutritional: Goal: Maintenance of adequate nutrition will improve Outcome: Progressing Goal: Progress toward achieving an optimal weight will improve Outcome: Progressing   Problem: Skin Integrity: Goal: Risk for impaired skin integrity will decrease Outcome: Progressing   Problem: Tissue Perfusion: Goal: Adequacy of tissue perfusion will improve Outcome: Progressing   Problem: Education: Goal: Knowledge of General Education information will improve Description: Including pain rating scale, medication(s)/side effects and non-pharmacologic comfort measures Outcome: Progressing   Problem: Health Behavior/Discharge Planning: Goal: Ability to manage health-related needs will improve Outcome: Progressing   Problem: Clinical Measurements: Goal: Ability to maintain clinical measurements within normal limits will improve Outcome: Progressing Goal: Will remain free from infection Outcome: Progressing Goal: Diagnostic test results will improve Outcome: Progressing Goal: Respiratory  complications will improve Outcome: Progressing Goal: Cardiovascular complication will be avoided Outcome: Progressing   Problem: Activity: Goal: Risk for activity intolerance will decrease Outcome: Progressing   Problem: Nutrition: Goal: Adequate nutrition will be maintained Outcome: Progressing   Problem: Coping: Goal: Level of anxiety will decrease Outcome: Progressing   Problem: Elimination: Goal: Will not experience complications related to bowel motility Outcome: Progressing Goal: Will not experience complications related to urinary retention Outcome: Progressing   Problem: Pain Managment: Goal: General experience of comfort will improve Outcome: Progressing   Problem: Safety: Goal: Ability to remain free from injury will improve Outcome: Progressing   Problem: Skin Integrity: Goal: Risk for impaired skin integrity will decrease Outcome: Progressing

## 2022-06-25 NOTE — Consult Note (Signed)
PHARMACY CONSULT NOTE - ELECTROLYTES  Pharmacy Consult for Electrolyte Monitoring and Replacement   Recent Labs: Potassium (mmol/L)  Date Value  06/25/2022 3.4 (L)   Magnesium (mg/dL)  Date Value  06/24/2022 2.2   Calcium (mg/dL)  Date Value  06/25/2022 8.3 (L)   Albumin (g/dL)  Date Value  06/25/2022 2.4 (L)   Phosphorus (mg/dL)  Date Value  06/25/2022 3.4   Sodium (mmol/L)  Date Value  06/25/2022 141   Assessment  Desiree Stone is a 47 y.o. female presenting with AHRF. PMH significant for asthma, T2DM, HTN, OSA, tobacco use (32 pack-years), obesity. Pharmacy has been consulted to monitor and replace electrolytes.  Diet/feeding: Tube Feeds-Vital High Protein 47m/hr, FW 57mq4h  Pertinent medications: furosemide IV 40 mg daily, insulin infusion per hyperglycemia protocol (started 1/31 overnight), propofol (triglycerides: 115 mg/dL on 2/1), fentanyl infusion  Goal of Therapy: Potassium 4-5 while on insulin infusion All other electrolytes WNL  Plan:  - K 3.4: will order 40 mEq KCL per tube and additional #2 10 mEq IV KCL - Will recheck electrolytes with AM labs  Thank you for allowing pharmacy to be a part of this patient's care.  CaDorothe PeaPharmD, BCPS Clinical Pharmacist   06/25/2022 4:53 AM

## 2022-06-25 NOTE — Progress Notes (Signed)
NAME:  Desiree Stone, MRN:  [id number], DOB:  1976-03-12, LOS: 71 ADMISSION DATE:  06/06/2022, CONSULTATION DATE:  06/07/2022 REFERRING MD:  Dr. Tamala Julian, CHIEF COMPLAINT:  Shortness of breath   Brief Pt Description / Synopsis:  47 year old female admitted with acute hypoxic and hypercapnic respiratory failure in the setting of acute COPD exacerbation, new onset CHF, and suspected community-acquired pneumonia requiring intubation and mechanical ventilation.  History of Present Illness:  47 yo F presenting to [facility] ED from home with complaints of increasing shortness of breath. History provided per chart review, some items confirmed with her mother via phone interview. Patient reported on arrival to increased dyspnea over the past few months, worsening acutely in the last couple of days. She stated she could hardly walk down the hallway at the school and had to stop and rest. She denied outpatient oxygen use and has been unable to use her CPAP as she is unable to sleep in her bed. She instead has been sleeping in her chair for the past several weeks. She endorsed continued cigarette smoking. In addition she reported a productive cough with thick sputum and palpitations, as well as lower extremity edema. She denied difficulty swallowing, increased abdominal distention, or heart failure history. 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 Lab work significant for mild hyponatremia, hyperglycemia, leukocytosis, elevated but flat troponin and mildly elevated BNP. She was negative for COVID, RSV & influenza. Her respiratory status deteriorated and she was requiring 4 L [place]. CTa imaging was negative for PE, but did reveal cardiomegaly and an enlarged pulmonary artery suggestive of pulmonary hypertension. TRH was consulted for admission. The  patient's respiratory status did not improve while waiting for bed placement in the ED, with ABG demonstrating respiratory acidosis. The patient was placed on BIPAP. Follow up ABG's showed persistent respiratory acidosis even with setting adjustments. Eventually the decision was made to urgently intubate the patient and place her on mechanical ventilatory support. Medications given: lasix 40 mg, IV contrast, fentanyl, rocuronium & etomidate, lovenox, duoneb, solu medrol Initial Vitals: 98.4, 24, 98, 136/99 & 90% on 2 L [place] Significant labs: (Labs/ Imaging personally reviewed) I, Domingo Pulse [place], AGACNP-BC, personally viewed and interpreted this ECG. EKG Interpretation: Date: 06/06/22, EKG Time: 12:29, Rate: 104, Rhythm: ST, QRS Axis:  LAD, Intervals: normal, ST/T Wave abnormalities: non specific T wave abnormalities, Narrative Interpretation: ST with LAD and non specific T wave abnormalities Chemistry: Na+: 134, K+: 3.9, BUN/Cr.: 6/ 0.52, Serum CO2/ AG: 31/10, Cl: 93   CXR 06/06/22: no active cardiopulmonary disease. No evidence of pneumonia or pulmonary edema. Bronchitis/reactive airway disease, likely chronic. Borderline cardiomegaly CT soft tissue neck w contrast 06/06/22: no acute finding in the neck CT angio chest PE 06/06/22: no large central PE. The distal pulmonary arteries are not well evaluated due to the patient body habitus. No other evidence of acute cardiopulmonary pathology. Cardiomegaly with enlarged pulmonary artery suggesting pulmonary hypertension and refluxed contrast into the IVC, suggesting right heart dysfunction. Left adrenal nodule has been present since 2012 consistent with a benign adenoma.   PCCM consulted for admission due to worsening acute respiratory failure suspect multifocal secondary to acute new onset heart failure & suspected COPD in the setting of suspected pulmonary hypertension requiring emergent intubation and mechanical ventilatory support.  Please see  "significant  hospital events" section below for full detailed hospital course.  Pertinent  Medical History   Past Medical History:  Diagnosis Date   Asthma    Diabetes mellitus without complication ([place])    Hypertension     Micro Data:  1/16: SARS-CoV-2/influenza/RSV PCR>> negative 1/16: Group A strep PCR>> negative 1/17: MRSA PCR>> negative 1/17: HIV screen>> nonreactive 1/18: Blood culture x 2>> no growth  1/18: Tracheal aspirate>> normal respiratory flora  Antimicrobials:   Anti-infectives (From admission, onward)    Start     Dose/Rate Route Frequency Ordered Stop   06/21/22 1015  ceFEPIme (MAXIPIME) 2 g in sodium chloride 0.9 % 100 mL IVPB        2 g 200 mL/hr over 30 Minutes Intravenous Every 8 hours 06/21/22 0921     06/13/22 2200  Ampicillin-Sulbactam (UNASYN) 3 g in sodium chloride 0.9 % 100 mL IVPB        3 g 200 mL/hr over 30 Minutes Intravenous Every 6 hours 06/13/22 1243 06/16/22 2150   06/12/22 0500  linezolid (ZYVOX) tablet 600 mg  Status:  Discontinued        600 mg Per Tube Every 12 hours 06/11/22 2301 06/13/22 1243   06/11/22 1400  piperacillin-tazobactam (ZOSYN) IVPB 3.375 g  Status:  Discontinued        3.375 g 12.5 mL/hr over 240 Minutes Intravenous Every 8 hours 06/11/22 1131 06/13/22 1243   06/11/22 1230  linezolid (ZYVOX) tablet 600 mg  Status:  Discontinued        600 mg Per Tube Every 12 hours 06/11/22 1131 06/11/22 2301   06/09/22 1100  cefTRIAXone (ROCEPHIN) 2 g in sodium chloride 0.9 % 100 mL IVPB  Status:  Discontinued        2 g 200 mL/hr over 30 Minutes Intravenous Every 24 hours 06/09/22 1006 06/11/22 1129   06/07/22 1100  azithromycin (ZITHROMAX) tablet 250 mg        250 mg Per Tube Daily 06/07/22 1006 06/11/22 0843       Significant Hospital Events: Including procedures, antibiotic start and stop dates in addition to other pertinent events   06/07/22: Admit to ICU with worsening acute respiratory failure suspect secondary to acute new  onset heart failure int he setting of suspected pulmonary hypertension & chronic COPD, OSA, asthma requiring emergent intubation and mechanical ventilatory support.  06/08/22- patient is s/p SBT with severe secretions, she did diurese >800cc today with lasix challenge, CXR with progressive pleural effusions. She has family coming in today. She's uisng OG and weve tranistioned to prednisone taper from solumedrol. Adding aldactone per tube. Iron workup today.  06/09/22-on minimal vent support, will attempt SBT.  Tracheal aspirate growing gram-positive cocci, ceftriaxone added 1/20 extubated and failed, re-intubated 1/21 severe COPD, remains on vent 1/22 severe COPD, remains on vent 1/23 severe upper airway swelling, ENT consulted 1/24 severe resp failure 1/26: Remains on vent, hemodynamically stable, tentative plan for Trach next Monday 1/29. 1/27: Remains on vent, no acute changes or events, awaiting Trach on Monday. D/C Solumedrol  1/28: Remains on vent, no acute changes.  BP soft, Lasix decreased to 40 mg daily. Low grade temperature overnight (T max 100), Obtain CT Chest 1/28: CT Chest>>Bilateral lower lobe consolidation, and right middle lobe       subsegmental atelectasis. No evidence of central endobronchial obstruction.       no evidence of pleural effusion. 2/01: CT Abd>>Portion of the transverse colon overlies the distal stomach.  Consider visualization of the transverse colon during percutaneous       gastrostomy tube placement. Persistent consolidation in both lower lobes.       Chronic left adrenal nodule. Minimal change since 2011 and likely a benign lesion.       Gallbladder distention without inflammatory changes. 2/02: Tolerated trach collar throughout the day  2/02: Speech therapy consulted for PMV trial, however pt did not tolerate PMV placement due to inability to redirect airflow superiorly for phonation and breathing.  Possibly will require trach downsize when deemed  appropriate 2/04: No acute events overnight.  Currently tolerating trach collar   Interim History / Subjective:  As outlined above   Objective   Blood pressure 129/81, pulse (!) 104, temperature 99.4 F (37.4 C), temperature source Axillary, resp. rate 20, height '4\' 11"'$  (1.499 m), weight 135.6 kg, last menstrual period 05/30/2022, SpO2 97 %. CVP:  [6 mmHg-21 mmHg] 14 mmHg  Vent Mode: PSV FiO2 (%):  [35 %-40 %] 35 % PEEP:  [5 cmH20] 5 cmH20 Pressure Support:  [8 cmH20] 8 cmH20 Plateau Pressure:  [12 cmH20] 12 cmH20   Intake/Output Summary (Last 24 hours) at 06/25/2022 0844 Last data filed at 06/25/2022 0700 Gross per 24 hour  Intake 2014.07 ml  Output 1415 ml  Net 599.07 ml   Filed Weights   06/19/22 0500 06/20/22 0417 06/25/22 0500  Weight: 130.5 kg 130.5 kg 135.6 kg    Examination: General: Acute on chronically obese female, laying in bed, NAD on trach collar  HENT: Atraumatic, normocephalic, neck supple, difficult to assess JVD due to body habitus Lungs: Inspiratory wheezes throughout, even, non labored, size 8 xlt midline dry/ intact   Cardiovascular: Sinus tachycardia, s1s2 no m/r/g, 2+ radial/2+ distal pulses, trace bilateral lower extremity edema  Abdomen: +BS x4, obese, soft, non distended, non tender  Extremities: No deformities, normal bulk and tone, moves all extremities  Neuro: Awake and following commands.  PERRLA GU: Foley catheter in place draining yellow urine  Resolved Hospital Problem list     Assessment & Plan:   #Acute hypoxic and hypercapnic respiratory failure in the setting of acute COPD exacerbation, new onset CHF, and pneumonia PMHx: OSA on CPAP, asthma, tobacco use - SBT/TCT as tolerated  - Maintain O2 sats 88 to 92% - Follow intermittent Chest X-ray & ABG as needed - Spontaneous Breathing Trials when respiratory parameters met and mental status permits - Continue VAP Bundle - Continue cefepime  - Bronchodilators - Diuresis as renal function  and blood pressure permits  #Decompensated CHF  #Elevated Troponin secondary to demand ischemia PMHx: HTN, HLD Patient reports orthopnea, dyspnea on exertion & BLE edema. CTa suggestive of Right heart dysfunction and pulmonary HTN -Echocardiogram 06/07/22: LVEF 60 to 81%, normal diastolic parameters, RV function normal - Continuous cardiac monitoring - Maintain MAP >65 - Diuresis as renal function and blood pressure permits~pt currently net positive continue lasix bid   #HAP  - Trend WBC and monitor fever curve - Trend PCT - Continue cefepime   #Hypokalemia  - Trend BMP - Replace electrolytes as indicated   #Diarrhea - GI panel results pending   #Type II diabetes mellitus~difficult to control   - Continue insulin gtt  - Diabetes coordinator consulted appreciate input   Acute metabolic encephalopathy~resolved - Will discontinue seroquel and prn fentanyl and change scheduled oxycodone to prn - PT/OT    Best Practice (right click and "Reselect all SmartList Selections" daily)   Diet/type: tubefeeds DVT prophylaxis: Lovenox GI prophylaxis:  N/A Lines: PICC line, and is still needed Foley:  N/A Code Status:  full code Last date of multidisciplinary goals of care discussion [06/25/2022]  02/4: Updated pt regarding current plan of care   Labs   CBC: Recent Labs  Lab 06/21/22 0431 06/22/22 0438 06/23/22 0441 06/24/22 0531 06/25/22 0353  WBC 15.4* 17.6* 22.3* 20.4* 16.6*  HGB 8.9* 10.1* 9.1* 9.5* 9.2*  HCT 35.7* 39.7 34.3* 36.0 34.4*  MCV 76.8* 77.4* 77.6* 75.5* 74.1*  PLT 260 290 250 305 220    Basic Metabolic Panel: Recent Labs  Lab 06/21/22 0431 06/22/22 0438 06/22/22 1709 06/23/22 0441 06/24/22 0531 06/24/22 1647 06/25/22 0353  NA 142 140  --  136 140 141 141  K 4.0 3.3* 4.4 4.1 3.9 3.9 3.4*  CL 100 101  --  102 104 103 104  CO2 37* 31  --  '29 30 30 30  '$ GLUCOSE 234* 244*  --  202* 186* 143* 180*  BUN 22* 23*  --  '16 20 20 '$ 21*  CREATININE 0.53 0.61   --  0.46 0.49 0.45 0.53  CALCIUM 8.6* 8.4*  --  7.6* 8.6* 8.3* 8.3*  MG 2.0 2.3  --  2.0 2.2  --  2.3  PHOS 2.5 4.6  --  3.2 2.5 3.5 3.4   GFR: Estimated Creatinine Clearance: 108.9 mL/min (by C-G formula based on SCr of 0.53 mg/dL). Recent Labs  Lab 06/21/22 0431 06/22/22 0438 06/23/22 0441 06/24/22 0531 06/25/22 0353  PROCALCITON <0.10  --  0.37  --   --   WBC 15.4* 17.6* 22.3* 20.4* 16.6*    Liver Function Tests: Recent Labs  Lab 06/22/22 0438 06/23/22 0441 06/24/22 0531 06/24/22 1647 06/25/22 0353  ALBUMIN 2.9* 2.4* 2.6* 2.5* 2.4*   No results for input(s): "LIPASE", "AMYLASE" in the last 168 hours. No results for input(s): "AMMONIA" in the last 168 hours.  ABG    Component Value Date/Time   PHART 7.42 06/07/2022 0828   PCO2ART 60 (H) 06/07/2022 0828   PO2ART 68 (L) 06/07/2022 0828   HCO3 38.9 (H) 06/07/2022 0828   O2SAT 95.7 06/07/2022 0828     Coagulation Profile: No results for input(s): "INR", "PROTIME" in the last 168 hours.  Cardiac Enzymes: No results for input(s): "CKTOTAL", "CKMB", "CKMBINDEX", "TROPONINI" in the last 168 hours.  HbA1C: Hgb A1c MFr Bld  Date/Time Value Ref Range Status  06/07/2022 04:54 AM 9.0 (H) 4.8 - 5.6 % Final    Comment:    (NOTE) Pre diabetes:          5.7%-6.4%  Diabetes:              >6.4%  Glycemic control for   <7.0% adults with diabetes     CBG: Recent Labs  Lab 06/25/22 0430 06/25/22 0543 06/25/22 0652 06/25/22 0731 06/25/22 0815  GLUCAP 184* 164* 188* 204* 194*    Review of Systems:   Unable to assess due to intubation and sedation  Past Medical History:  She,  has a past medical history of Asthma, Diabetes mellitus without complication ([place]), and Hypertension.   Surgical History:   Past Surgical History:  Procedure Laterality Date   carpel tunnel     COLONOSCOPY WITH PROPOFOL N/A 12/08/2014   Procedure: COLONOSCOPY WITH PROPOFOL;  Surgeon: Lollie Sails, MD;  Location: [facility] ENDOSCOPY;   Service: Endoscopy;  Laterality: N/A;   ESOPHAGOGASTRODUODENOSCOPY     TRACHEOSTOMY TUBE PLACEMENT N/A 06/19/2022   Procedure: TRACHEOSTOMY;  Surgeon: Beverly Gust,  MD;  Location: [facility] ORS;  Service: ENT;  Laterality: N/A;     Social History:   reports that she quit smoking about 5 years ago. Her smoking use included cigarettes. She has never used smokeless tobacco. She reports that she does not drink alcohol and does not use drugs.   Family History:  Her family history includes Breast cancer (age of onset: 44) in her paternal aunt; Cancer in her paternal grandmother.   Allergies Allergies  Allergen Reactions   Abilify [Aripiprazole] Other (See Comments)    Syncope   Effexor [Venlafaxine] Other (See Comments)    Hair Loss   Wellbutrin [Bupropion] Other (See Comments)    Acne      Home Medications  Prior to Admission medications   Medication Sig Start Date End Date Taking? Authorizing Provider  albuterol (PROVENTIL HFA;VENTOLIN HFA) 108 (90 BASE) MCG/ACT inhaler Inhale into the lungs every 6 (six) hours as needed for wheezing or shortness of breath.   Yes [provider]  atorvastatin (LIPITOR) 40 MG tablet Take 40 mg by mouth at bedtime. 05/06/20  Yes [provider]  cetirizine (ZYRTEC) 10 MG tablet Take 10 mg by mouth daily.   Yes [provider]  clindamycin (CLEOCIN T) 1 % external solution Apply 1 Application topically 2 (two) times daily.   Yes [provider]  cyanocobalamin 1000 MCG tablet Take 1,000 mcg by mouth daily.   Yes [provider]  enalapril (VASOTEC) 20 MG tablet Take 20 mg by mouth daily.   Yes [provider]  famotidine (PEPCID) 20 MG tablet Take 20 mg by mouth 2 (two) times daily.   Yes [provider]  fluticasone (FLOVENT HFA) 110 MCG/ACT inhaler Inhale 2 puffs into the lungs 2 (two) times daily.   Yes [provider]  hydrochlorothiazide (HYDRODIURIL) 25 MG tablet Take 25 mg by  mouth daily.   Yes [provider]  hydrOXYzine (ATARAX) 25 MG tablet Take 25 mg by mouth 3 (three) times daily as needed.   Yes [provider]  ipratropium (ATROVENT HFA) 17 MCG/ACT inhaler Inhale 2 puffs into the lungs every 4 (four) hours as needed for wheezing.   Yes [provider]  liraglutide (VICTOZA) 18 MG/3ML SOPN Inject 2.4 mg into the skin daily.   Yes [provider]  metFORMIN (GLUCOPHAGE-XR) 500 MG 24 hr tablet Take 1,000 mg by mouth 2 (two) times daily with a meal.   Yes [provider]     Critical care time: 31 minutes     Donell Beers, [place] Pager [phone] (please enter 7 digits) PCCM Consult Pager [phone] (please enter 7 digits)

## 2022-06-25 NOTE — Progress Notes (Signed)
Speech Language Pathology Treatment:    Patient Details Name: YITZEL SHASTEEN MRN: [id number] DOB: 1976-02-10 Today's Date: 06/25/2022 Time: 8185-6314 SLP Time Calculation (min) (ACUTE ONLY): 10 min  Assessment / Plan / Recommendation Clinical Impression  Pt seen for repeat PMSV evaluation. Pt alert and cooperative. Eager to attempt re-evaluation. Pt communicating via mouthed words. Pt with Shiley #8 XLT cuffed tracheostomy. Cuff deflated prior to SLP entrance to room. Pt on 35% FiO2 via TC.   Finger occlusion attempted x3. Pt unable to phonate with finger occlusion. Breathstacking appreciated after prolonged finger occlusion (~15s). PMSV trial deferred given pt's poor upper airway patency. Today's findings seem consistent with initial PMSV assessment, 06/24/22.   PMSV use not recommended at this time. Recommend consideration for tracheostomy downsize prior to re-attempting PMSV evaluation. Pt and RN made aware of results, recommendations, and SLP POC. RN to relay to MD. Pt nodding in agreement.  SLP to continue to monitor for tracheostomy downsizing, as appropriate.     HPI HPI: Pt is a 47 year old female admitted with acute hypoxic and hypercapnic respiratory failure in the setting of acute COPD exacerbation, new onset CHF, Obesity, ongoing tobacco use, and suspected community-acquired pneumonia.  She endorsed that over the last several months, she has been experiencing gradually worsening shortness of breath and dyspnea on exertion in addition to orthopnea. In addition, she endorses a productive cough with thick sputum and palpitations.  Post admission, she required intubation and mechanical ventilation d/t ongoing Pulmonary decline.  Pt was orally intubated from 06/08/22-06/19/22 when tracheostomy was performed. Pt s/p tracheostomy; NGT remains in place.   CXR on 06/22/22: Bilateral mid and lower lung interstitial thickening with  left-greater-than-right basilar heterogeneous airspace opacities,   similar to prior. Findings could represent atelectasis or pneumonia.  3. Small left pleural effusion.      SLP Plan  Continue with current plan of care      Recommendations for follow up therapy are one component of a multi-disciplinary discharge planning process, led by the attending physician.  Recommendations may be updated based on patient status, additional functional criteria and insurance authorization.    Recommendations         Patient may use Passy-Muir Speech Valve: with SLP only (ongoing assessment; pending trach downsize) MD: Please consider changing trach tube to : Smaller size (when appropriate)         Follow Up Recommendations: Skilled nursing-short term rehab (<3 hours/day) Assistance recommended at discharge: Frequent or constant Supervision/Assistance SLP Visit Diagnosis: Aphonia (R49.1) (due to tracheostomy) Plan: Continue with current plan of care          [name], M.S., [facility] [phone] ([place])  [name]  06/25/2022, 1:13 PM

## 2022-06-26 DIAGNOSIS — J9602 Acute respiratory failure with hypercapnia: Secondary | ICD-10-CM | POA: Diagnosis not present

## 2022-06-26 DIAGNOSIS — J9601 Acute respiratory failure with hypoxia: Secondary | ICD-10-CM | POA: Diagnosis not present

## 2022-06-26 LAB — RENAL FUNCTION PANEL
Albumin: 2.4 g/dL — ABNORMAL LOW (ref 3.5–5.0)
Anion gap: 9 (ref 5–15)
BUN: 20 mg/dL (ref 6–20)
CO2: 29 mmol/L (ref 22–32)
Calcium: 8.5 mg/dL — ABNORMAL LOW (ref 8.9–10.3)
Chloride: 104 mmol/L (ref 98–111)
Creatinine, Ser: 0.45 mg/dL (ref 0.44–1.00)
GFR, Estimated: >60 mL/min (ref >60)
Glucose, Bld: 244 mg/dL — ABNORMAL HIGH (ref 70–99)
Phosphorus: 3.1 mg/dL (ref 2.5–4.6)
Potassium: 3.9 mmol/L (ref 3.5–5.1)
Sodium: 142 mmol/L (ref 135–145)

## 2022-06-26 LAB — GLUCOSE, CAPILLARY
Glucose-Capillary: 240 mg/dL — ABNORMAL HIGH (ref 70–99)
Glucose-Capillary: 241 mg/dL — ABNORMAL HIGH (ref 70–99)
Glucose-Capillary: 234 mg/dL — ABNORMAL HIGH (ref 70–99)
Glucose-Capillary: 262 mg/dL — ABNORMAL HIGH (ref 70–99)
Glucose-Capillary: 226 mg/dL — ABNORMAL HIGH (ref 70–99)

## 2022-06-26 LAB — CBC
HCT: 34 % — ABNORMAL LOW (ref 36.0–46.0)
Hemoglobin: 9.1 g/dL — ABNORMAL LOW (ref 12.0–15.0)
MCH: 19.9 pg — ABNORMAL LOW (ref 26.0–34.0)
MCHC: 26.8 g/dL — ABNORMAL LOW (ref 30.0–36.0)
MCV: 74.2 fL — ABNORMAL LOW (ref 80.0–100.0)
Platelets: 333 10*3/uL (ref 150–400)
RBC: 4.58 MIL/uL (ref 3.87–5.11)
RDW: 26.5 % — ABNORMAL HIGH (ref 11.5–15.5)
WBC: 14.4 10*3/uL — ABNORMAL HIGH (ref 4.0–10.5)
nRBC: 0 % (ref 0.0–0.2)

## 2022-06-26 LAB — MAGNESIUM: Magnesium: 2.1 mg/dL (ref 1.7–2.4)

## 2022-06-26 LAB — TRIGLYCERIDES: Triglycerides: 103 mg/dL (ref <150)

## 2022-06-26 MED ORDER — INSULIN ASPART 100 UNIT/ML IJ SOLN
0.0000 [IU] | INTRAMUSCULAR | Status: DC
  Administered 2022-06-26: 5 [IU] via SUBCUTANEOUS
  Administered 2022-06-26 – 2022-06-27 (×8): 3 [IU] via SUBCUTANEOUS
  Administered 2022-06-28 (×5): 2 [IU] via SUBCUTANEOUS
  Administered 2022-06-28: 4 [IU] via SUBCUTANEOUS
  Administered 2022-06-28: 2 [IU] via SUBCUTANEOUS
  Administered 2022-06-29 (×2): 1 [IU] via SUBCUTANEOUS
  Administered 2022-06-29 (×4): 2 [IU] via SUBCUTANEOUS
  Administered 2022-06-30: 1 [IU] via SUBCUTANEOUS
  Administered 2022-06-30: 2 [IU] via SUBCUTANEOUS
  Administered 2022-06-30: 1 [IU] via SUBCUTANEOUS
  Administered 2022-06-30: 2 [IU] via SUBCUTANEOUS
  Administered 2022-07-01 (×5): 1 [IU] via SUBCUTANEOUS
  Administered 2022-07-02: 2 [IU] via SUBCUTANEOUS
  Administered 2022-07-02: 1 [IU] via SUBCUTANEOUS
  Administered 2022-07-02: 2 [IU] via SUBCUTANEOUS
  Administered 2022-07-02 (×2): 1 [IU] via SUBCUTANEOUS
  Administered 2022-07-03: 2 [IU] via SUBCUTANEOUS
  Administered 2022-07-03: 1 [IU] via SUBCUTANEOUS
  Administered 2022-07-03: 2 [IU] via SUBCUTANEOUS
  Administered 2022-07-03: 1 [IU] via SUBCUTANEOUS
  Administered 2022-07-03: 2 [IU] via SUBCUTANEOUS
  Administered 2022-07-04: 1 [IU] via SUBCUTANEOUS
  Administered 2022-07-04: 2 [IU] via SUBCUTANEOUS
  Administered 2022-07-04: 1 [IU] via SUBCUTANEOUS
  Administered 2022-07-04 (×2): 2 [IU] via SUBCUTANEOUS
  Administered 2022-07-04: 1 [IU] via SUBCUTANEOUS
  Administered 2022-07-05: 3 [IU] via SUBCUTANEOUS
  Filled 2022-06-26 (×41): qty 1

## 2022-06-26 MED ORDER — INSULIN ASPART 100 UNIT/ML IJ SOLN
4.0000 [IU] | INTRAMUSCULAR | Status: DC
  Administered 2022-06-26 – 2022-06-27 (×6): 4 [IU] via SUBCUTANEOUS
  Filled 2022-06-26 (×5): qty 1

## 2022-06-26 NOTE — Inpatient Diabetes Management (Signed)
Inpatient Diabetes Program Recommendations  AACE/ADA: New Consensus Statement on Inpatient Glycemic Control (2015)  Target Ranges:  Prepandial:   less than 140 mg/dL      Peak postprandial:   less than 180 mg/dL (1-2 hours)      Critically ill patients:  140 - 180 mg/dL    Latest Reference Range & Units 06/25/22 09:18 06/25/22 10:19 06/25/22 11:21 06/25/22 12:25 06/25/22 13:33 06/25/22 17:32 06/25/22 22:59  Glucose-Capillary 70 - 99 mg/dL 197 (H)  IV Insulin Drip Infusing 189 (H) 207 (H)  25 units Semglee 167 (H) 162 (H)  IV Insulin Drip Stopped 246 (H)  3 units Novolog  256 (H)  5 units Novolog  25 units Semglee '@2125'$   (H): Data is abnormally high   Latest Reference Range & Units 06/26/22 06:12  Glucose-Capillary 70 - 99 mg/dL 240 (H)  3 units Novolog   (H): Data is abnormally high    Home DM Meds: Victoza 2.4 mg daily     Metformin 1000 mg BID   Current Orders:  Semglee 25 units BID   Novolog 0-9 units Q6H    Transitioned to SQ Insulin from the Drip yest around 12pm.    TF running 55cc/hr      MD- Please consider:  1. Increase the Novolog SSI frequency to Q4 hours since getting tube feeds  2.Start Novolog Tube Feed Coverage: Novolog 4 units Q4 hours HOLD if tube feeds HELD for any reason    --Will follow patient during hospitalization--  Wyn Quaker RN, MSN, [place] Diabetes Coordinator Inpatient Glycemic Control Team Team Pager: [phone] (8a-5p)

## 2022-06-26 NOTE — Consult Note (Signed)
PHARMACY CONSULT NOTE - ELECTROLYTES  Pharmacy Consult for Electrolyte Monitoring and Replacement   Recent Labs: Potassium (mmol/L)  Date Value  06/26/2022 3.9   Magnesium (mg/dL)  Date Value  06/26/2022 2.1   Calcium (mg/dL)  Date Value  06/26/2022 8.5 (L)   Albumin (g/dL)  Date Value  06/26/2022 2.4 (L)   Phosphorus (mg/dL)  Date Value  06/26/2022 3.1   Sodium (mmol/L)  Date Value  06/26/2022 142   Assessment  Desiree Stone is a 47 y.o. female presenting with AHRF. PMH significant for asthma, T2DM, HTN, OSA, tobacco use (32 pack-years), obesity. Pharmacy has been consulted to monitor and replace electrolytes while in CCU.  Diet/feeding: Tube Feeds-Vital High Protein 55 ml/hr, FW 31m q4h  Pertinent medications:  furosemide IV 40 mg BID  Goal of Therapy: Electrolytes WNL  Plan:  - no electrolyte replacement warranted for today - Will recheck electrolytes with AM labs  Thank you for allowing pharmacy to be a part of this patient's care.  RDallie Piles PharmD, BCPS Clinical Pharmacist   06/26/2022 7:03 AM

## 2022-06-26 NOTE — Evaluation (Signed)
Occupational Therapy Evaluation Patient Details Name: Desiree Stone MRN: [id number] DOB: 04-21-1976 Today's Date: 06/26/2022   History of Present Illness Patient is a 47 year old female with acute hypoxic and hypercapnic respiratory failure, acute COPD exacerbation, new onset CHF, and suspected community-acquired pneumonia requiring intubation and mechanical ventilation. History of tobacco use   Clinical Impression   Patient presenting with decreased Ind in self care,balance, functional mobility/transfers, endurance, and safety awareness. Patient reports living at home alone and independently. 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 Pt is very pleasant and motivated for therapy today.   Patient currently needing mod A of 2 for bed mobility and standing attempt. She was able to clear buttocks and stand but difficult from ICU bed secondary to pt being 4'11. Pt tolerates sitting EOB for ~ 10 minutes with close supervision - CGA. O2 saturation drops to mid 80's but pt able to increase it to 90's with focus on breathing techniques. Pt is a great rehab candidate.  Patient will benefit from acute OT to increase overall independence in the areas of ADLs, functional mobility, and safety awareness in order to safely discharge to next venue of care.      Recommendations for follow up therapy are one component of a multi-disciplinary discharge planning process, led by the attending physician.  Recommendations may be updated based on patient status, additional functional criteria and insurance authorization.   Follow Up Recommendations  Acute inpatient rehab (3hours/day)     Assistance Recommended at Discharge Frequent or constant Supervision/Assistance  Patient can return home with the following Two people to help with walking and/or transfers;A lot of help with bathing/dressing/bathroom;Assist for transportation;Assistance with cooking/housework;Help with stairs or ramp for entrance     Functional Status Assessment  Patient has had a recent decline in their functional status and demonstrates the ability to make significant improvements in function in a reasonable and predictable amount of time.  Equipment Recommendations  Other (comment) (defer to next venue of care)       Precautions / Restrictions Precautions Precautions: Fall Restrictions Weight Bearing Restrictions: No      Mobility Bed Mobility Overal bed mobility: Needs Assistance Bed Mobility: Supine to Sit, Sit to Supine     Supine to sit: Mod assist, +2 for physical assistance Sit to supine: Mod assist, +2 for physical assistance   General bed mobility comments: verbal cues for technique. assistance for trunk and BLE support. increased time and effort required    Transfers Overall transfer level: Needs assistance Equipment used: 2 person hand held assist Transfers: Sit to/from Stand Sit to Stand: Mod assist, +2 physical assistance           General transfer comment: patient is 4'11" and barely able to touch the ground from sitting position even with mattress seat deflated. she was able to stand x 1 bout, but has posterior leg/buttock support of the bed.      Balance Overall balance assessment: Needs assistance Sitting-balance support: Feet supported Sitting balance-Leahy Scale: Fair Sitting balance - Comments: occasional external support provided initially with improved sitting balance with increased sitting time   Standing balance support: Bilateral upper extremity supported Standing balance-Leahy Scale: Poor Standing balance comment: external support required for safety with bilateral knees blocked to prevent buckling                           ADL either performed or assessed with clinical judgement  ADL Overall ADL's : Needs assistance/impaired                     Lower Body Dressing: Maximal assistance Lower Body Dressing Details (indicate cue type and reason):  to don B socks                     Vision Patient Visual Report: No change from baseline              Pertinent Vitals/Pain Pain Assessment Pain Assessment: No/denies pain     Hand Dominance Right   Extremity/Trunk Assessment Upper Extremity Assessment Upper Extremity Assessment: Generalized weakness   Lower Extremity Assessment Lower Extremity Assessment: Generalized weakness       Communication Communication Communication: Tracheostomy   Cognition Arousal/Alertness: Awake/alert Behavior During Therapy: WFL for tasks assessed/performed Overall Cognitive Status: Difficult to assess                                 General Comments: patient is able to follow single step commands consistently     General Comments  vitals monitored throughout session. Sp02 dropped to 85% briefly but increased back into the 90's with cues for breathing techniques. patient is very motivated to participate with PT            Home Living Family/patient expects to be discharged to:: Private residence Living Arrangements: Alone Available Help at Discharge: Family Type of Home: Apartment Home Access: Level entry     [place]: One level     Bathroom Shower/Tub: Teacher, early years/pre: [place]: None          Prior Functioning/Environment Prior Level of Function : Independent/Modified Independent;Driving             Mobility Comments: patient is independent with ambulation, works as a Freight forwarder, drives ADLs Comments: Ind with self care and IADLs        OT Problem List: Decreased strength;Decreased activity tolerance;Decreased safety awareness;Impaired balance (sitting and/or standing);Decreased knowledge of use of DME or AE;Obesity;Cardiopulmonary status limiting activity      OT Treatment/Interventions: Self-care/ADL training;Therapeutic exercise;Therapeutic activities;Energy conservation;DME and/or AE  instruction;Balance training;Patient/family education    OT Goals(Current goals can be found in the care plan section) Acute Rehab OT Goals Patient Stated Goal: to get better OT Goal Formulation: With patient Time For Goal Achievement: 07/10/22 Potential to Achieve Goals: Good ADL Goals Pt Will Perform Grooming: with min assist;standing Pt Will Perform Lower Body Dressing: with min assist;sit to/from stand Pt Will Transfer to Toilet: with min assist;ambulating Pt Will Perform Toileting - Clothing Manipulation and hygiene: with min assist;sit to/from stand  OT Frequency: Min 3X/week    Co-evaluation PT/OT/SLP Co-Evaluation/Treatment: Yes Reason for Co-Treatment: Complexity of the patient's impairments (multi-system involvement);For patient/therapist safety;To address functional/ADL transfers PT goals addressed during session: Mobility/safety with mobility OT goals addressed during session: ADL's and self-care;Proper use of Adaptive equipment and DME      AM-PAC OT "6 Clicks" Daily Activity     Outcome Measure Help from another person eating meals?: None Help from another person taking care of personal grooming?: A Little Help from another person toileting, which includes using toliet, bedpan, or urinal?: Total Help from another person bathing (including washing, rinsing, drying)?: A Lot Help from another person to put on and taking off regular upper body clothing?: A Little Help from  another person to put on and taking off regular lower body clothing?: Total 6 Click Score: 14   End of Session Nurse Communication: Mobility status  Activity Tolerance: Patient tolerated treatment well Patient left: in bed;with call bell/phone within reach;with bed alarm set  OT Visit Diagnosis: Unsteadiness on feet (R26.81);Repeated falls (R29.6);Muscle weakness (generalized) (M62.81)                Time: 8682-5749 OT Time Calculation (min): 26 min Charges:  OT General Charges $OT Visit: 1  Visit OT Evaluation $OT Eval Moderate Complexity: 1 [address], [place], OTR/L , [facility] ascom [phone]  06/26/22, 12:55 PM

## 2022-06-26 NOTE — Progress Notes (Signed)
Inpatient Rehab Admissions Coordinator:  ? ?Per therapy recommendations,  patient was screened for CIR candidacy by Kaiana Marion, MS, CCC-SLP. At this time, Pt. Appears to be a a potential candidate for CIR. I will place   order for rehab consult per protocol for full assessment. Please contact me any with questions. ? ?Marlies Ligman, MS, CCC-SLP ?Rehab Admissions Coordinator  ?[phone] (celll) ?[phone] (office) ? ?

## 2022-06-26 NOTE — Progress Notes (Signed)
NAME:  Desiree Stone, MRN:  [id number], DOB:  September 17, 1975, LOS: 71 ADMISSION DATE:  06/06/2022, CONSULTATION DATE:  06/07/2022 REFERRING MD:  Dr. Tamala Julian, CHIEF COMPLAINT:  Shortness of breath   Brief Pt Description / Synopsis:  47 year old female admitted with acute hypoxic and hypercapnic respiratory failure in the setting of acute COPD exacerbation, new onset CHF, and suspected community-acquired pneumonia requiring intubation and mechanical ventilation.  History of Present Illness:  47 yo F presenting to [facility] ED from home with complaints of increasing shortness of breath. History provided per chart review, some items confirmed with her mother via phone interview. Patient reported on arrival to increased dyspnea over the past few months, worsening acutely in the last couple of days. She stated she could hardly walk down the hallway at the school and had to stop and rest. She denied outpatient oxygen use and has been unable to use her CPAP as she is unable to sleep in her bed. She instead has been sleeping in her chair for the past several weeks. She endorsed continued cigarette smoking. In addition she reported a productive cough with thick sputum and palpitations, as well as lower extremity edema. She denied difficulty swallowing, increased abdominal distention, or heart failure history. 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 Lab work significant for mild hyponatremia, hyperglycemia, leukocytosis, elevated but flat troponin and mildly elevated BNP. She was negative for COVID, RSV & influenza. Her respiratory status deteriorated and she was requiring 4 L [place]. CTa imaging was negative for PE, but did reveal cardiomegaly and an enlarged pulmonary artery suggestive of pulmonary hypertension. TRH was consulted for admission. The  patient's respiratory status did not improve while waiting for bed placement in the ED, with ABG demonstrating respiratory acidosis. The patient was placed on BIPAP. Follow up ABG's showed persistent respiratory acidosis even with setting adjustments. Eventually the decision was made to urgently intubate the patient and place her on mechanical ventilatory support. Medications given: lasix 40 mg, IV contrast, fentanyl, rocuronium & etomidate, lovenox, duoneb, solu medrol Initial Vitals: 98.4, 24, 98, 136/99 & 90% on 2 L [place] Significant labs: (Labs/ Imaging personally reviewed) I, Domingo Pulse [place], AGACNP-BC, personally viewed and interpreted this ECG. EKG Interpretation: Date: 06/06/22, EKG Time: 12:29, Rate: 104, Rhythm: ST, QRS Axis:  LAD, Intervals: normal, ST/T Wave abnormalities: non specific T wave abnormalities, Narrative Interpretation: ST with LAD and non specific T wave abnormalities Chemistry: Na+: 134, K+: 3.9, BUN/Cr.: 6/ 0.52, Serum CO2/ AG: 31/10, Cl: 93   CXR 06/06/22: no active cardiopulmonary disease. No evidence of pneumonia or pulmonary edema. Bronchitis/reactive airway disease, likely chronic. Borderline cardiomegaly CT soft tissue neck w contrast 06/06/22: no acute finding in the neck CT angio chest PE 06/06/22: no large central PE. The distal pulmonary arteries are not well evaluated due to the patient body habitus. No other evidence of acute cardiopulmonary pathology. Cardiomegaly with enlarged pulmonary artery suggesting pulmonary hypertension and refluxed contrast into the IVC, suggesting right heart dysfunction. Left adrenal nodule has been present since 2012 consistent with a benign adenoma.   PCCM consulted for admission due to worsening acute respiratory failure suspect multifocal secondary to acute new onset heart failure & suspected COPD in the setting of suspected pulmonary hypertension requiring emergent intubation and mechanical ventilatory support.  Please see  "significant  hospital events" section below for full detailed hospital course.  Pertinent  Medical History   Past Medical History:  Diagnosis Date   Asthma    Diabetes mellitus without complication ([place])    Hypertension     Micro Data:  1/16: SARS-CoV-2/influenza/RSV PCR>> negative 1/16: Group A strep PCR>> negative 1/17: MRSA PCR>> negative 1/17: HIV screen>> nonreactive 1/18: Blood culture x 2>> no growth  1/18: Tracheal aspirate>> normal respiratory flora  Antimicrobials:   Anti-infectives (From admission, onward)    Start     Dose/Rate Route Frequency Ordered Stop   06/21/22 1015  ceFEPIme (MAXIPIME) 2 g in sodium chloride 0.9 % 100 mL IVPB        2 g 200 mL/hr over 30 Minutes Intravenous Every 8 hours 06/21/22 0921     06/13/22 2200  Ampicillin-Sulbactam (UNASYN) 3 g in sodium chloride 0.9 % 100 mL IVPB        3 g 200 mL/hr over 30 Minutes Intravenous Every 6 hours 06/13/22 1243 06/16/22 2150   06/12/22 0500  linezolid (ZYVOX) tablet 600 mg  Status:  Discontinued        600 mg Per Tube Every 12 hours 06/11/22 2301 06/13/22 1243   06/11/22 1400  piperacillin-tazobactam (ZOSYN) IVPB 3.375 g  Status:  Discontinued        3.375 g 12.5 mL/hr over 240 Minutes Intravenous Every 8 hours 06/11/22 1131 06/13/22 1243   06/11/22 1230  linezolid (ZYVOX) tablet 600 mg  Status:  Discontinued        600 mg Per Tube Every 12 hours 06/11/22 1131 06/11/22 2301   06/09/22 1100  cefTRIAXone (ROCEPHIN) 2 g in sodium chloride 0.9 % 100 mL IVPB  Status:  Discontinued        2 g 200 mL/hr over 30 Minutes Intravenous Every 24 hours 06/09/22 1006 06/11/22 1129   06/07/22 1100  azithromycin (ZITHROMAX) tablet 250 mg        250 mg Per Tube Daily 06/07/22 1006 06/11/22 0843       Significant Hospital Events: Including procedures, antibiotic start and stop dates in addition to other pertinent events   06/07/22: Admit to ICU with worsening acute respiratory failure suspect secondary to acute new  onset heart failure int he setting of suspected pulmonary hypertension & chronic COPD, OSA, asthma requiring emergent intubation and mechanical ventilatory support.  06/08/22- patient is s/p SBT with severe secretions, she did diurese >800cc today with lasix challenge, CXR with progressive pleural effusions. She has family coming in today. She's uisng OG and weve tranistioned to prednisone taper from solumedrol. Adding aldactone per tube. Iron workup today.  06/09/22-on minimal vent support, will attempt SBT.  Tracheal aspirate growing gram-positive cocci, ceftriaxone added 1/20 extubated and failed, re-intubated 1/21 severe COPD, remains on vent 1/22 severe COPD, remains on vent 1/23 severe upper airway swelling, ENT consulted 1/24 severe resp failure 1/26: Remains on vent, hemodynamically stable, tentative plan for Trach next Monday 1/29. 1/27: Remains on vent, no acute changes or events, awaiting Trach on Monday. D/C Solumedrol  1/28: Remains on vent, no acute changes.  BP soft, Lasix decreased to 40 mg daily. Low grade temperature overnight (T max 100), Obtain CT Chest 1/28: CT Chest>>Bilateral lower lobe consolidation, and right middle lobe       subsegmental atelectasis. No evidence of central endobronchial obstruction.       no evidence of pleural effusion. 2/01: CT Abd>>Portion of the transverse colon overlies the distal stomach.  Consider visualization of the transverse colon during percutaneous       gastrostomy tube placement. Persistent consolidation in both lower lobes.       Chronic left adrenal nodule. Minimal change since 2011 and likely a benign lesion.       Gallbladder distention without inflammatory changes. 2/02: Tolerated trach collar throughout the day  2/02: Speech therapy consulted for PMV trial, however pt did not tolerate PMV placement due to inability to redirect airflow superiorly for phonation and breathing.  Possibly will require trach downsize when deemed  appropriate 2/04: No acute events overnight.  Currently tolerating trach collar  2/05: Remains on the vent at night and TC during the day. Tolerating TC Trials   Interim History / Subjective:  -Fever last night of 101  Objective   Blood pressure 137/73, pulse 98, temperature (!) 101 F (38.3 C), temperature source Axillary, resp. rate (!) 33, height '4\' 11"'$  (1.499 m), weight 135.6 kg, last menstrual period 05/30/2022, SpO2 97 %. CVP:  [13 mmHg-21 mmHg] 14 mmHg  Vent Mode: PSV FiO2 (%):  [35 %-40 %] 35 % PEEP:  [5 cmH20] 5 cmH20 Pressure Support:  [8 cmH20] 8 cmH20 Plateau Pressure:  [22 cmH20-26 cmH20] 22 cmH20   Intake/Output Summary (Last 24 hours) at 06/26/2022 0541 Last data filed at 06/26/2022 0500 Gross per 24 hour  Intake 2571.78 ml  Output 1350 ml  Net 1221.78 ml    Filed Weights   06/19/22 0500 06/20/22 0417 06/25/22 0500  Weight: 130.5 kg 130.5 kg 135.6 kg    Examination: General: Acute on chronically obese female, laying in bed, NAD on trach collar  HENT: Atraumatic, normocephalic, neck supple, difficult to assess JVD due to body habitus Lungs: Inspiratory wheezes throughout, even, non labored, size 8 xlt midline dry/ intact   Cardiovascular: Sinus tachycardia, s1s2 no m/r/g, 2+ radial/2+ distal pulses, trace bilateral lower extremity edema  Abdomen: +BS x4, obese, soft, non distended, non tender  Extremities: No deformities, normal bulk and tone, moves all extremities  Neuro: Awake and following commands.  PERRLA GU: Foley catheter in place draining yellow urine  Resolved Hospital Problem list     Assessment & Plan:   #Acute hypoxic and hypercapnic respiratory failure in the setting of acute COPD exacerbation, new onset CHF, and pneumonia PMHx: OSA on CPAP, asthma, tobacco use - SBT/TCT as tolerated  - Maintain O2 sats 88 to 92% - Follow intermittent Chest X-ray & ABG as needed - Spontaneous Breathing Trials when respiratory parameters met and mental status  permits - Continue VAP Bundle - Continue cefepime  - Bronchodilators - Diuresis as renal function and blood pressure permits  #Decompensated CHF  #Elevated Troponin secondary to demand ischemia PMHx: HTN, HLD Patient reports orthopnea, dyspnea on exertion & BLE edema. CTa suggestive of Right heart dysfunction and pulmonary HTN -Echocardiogram 06/07/22: LVEF 60 to 34%, normal diastolic parameters, RV function normal - Continuous cardiac monitoring - Maintain MAP >65 - Diuresis as renal function and blood pressure permits~pt currently net positive continue lasix bid   #HAP  - Trend WBC and monitor fever curve - Trend PCT - Continue cefepime   #Hypokalemia  - Trend BMP - Replace electrolytes as indicated   #Diarrhea - GI panel results pending   #Type II diabetes mellitus~difficult to control   - Continue insulin gtt  - Diabetes coordinator consulted appreciate input   Acute metabolic encephalopathy~resolved - Will discontinue seroquel and prn fentanyl and change scheduled oxycodone to prn - PT/OT  Best Practice (right click and "Reselect all SmartList Selections" daily)   Diet/type: tubefeeds DVT prophylaxis: Lovenox GI prophylaxis: N/A Lines: PICC line, and is still needed Foley:  N/A Code Status:  full code Last date of multidisciplinary goals of care discussion [06/25/2022]  02/4: Updated pt regarding current plan of care   Labs   CBC: Recent Labs  Lab 06/22/22 0438 06/23/22 0441 06/24/22 0531 06/25/22 0353 06/26/22 0347  WBC 17.6* 22.3* 20.4* 16.6* 14.4*  HGB 10.1* 9.1* 9.5* 9.2* 9.1*  HCT 39.7 34.3* 36.0 34.4* 34.0*  MCV 77.4* 77.6* 75.5* 74.1* 74.2*  PLT 290 250 305 322 333     Basic Metabolic Panel: Recent Labs  Lab 06/22/22 0438 06/22/22 1709 06/23/22 0441 06/24/22 0531 06/24/22 1647 06/25/22 0353 06/26/22 0347  NA 140  --  136 140 141 141 142  K 3.3*   < > 4.1 3.9 3.9 3.4* 3.9  CL 101  --  102 104 103 104 104  CO2 31  --  '29 30 30 30  29  '$ GLUCOSE 244*  --  202* 186* 143* 180* 244*  BUN 23*  --  '16 20 20 '$ 21* 20  CREATININE 0.61  --  0.46 0.49 0.45 0.53 0.45  CALCIUM 8.4*  --  7.6* 8.6* 8.3* 8.3* 8.5*  MG 2.3  --  2.0 2.2  --  2.3 2.1  PHOS 4.6  --  3.2 2.5 3.5 3.4 3.1   < > = values in this interval not displayed.    GFR: Estimated Creatinine Clearance: 108.9 mL/min (by C-G formula based on SCr of 0.45 mg/dL). Recent Labs  Lab 06/21/22 0431 06/22/22 0438 06/23/22 0441 06/24/22 0531 06/25/22 0353 06/26/22 0347  PROCALCITON <0.10  --  0.37  --   --   --   WBC 15.4*   < > 22.3* 20.4* 16.6* 14.4*   < > = values in this interval not displayed.     Liver Function Tests: Recent Labs  Lab 06/23/22 0441 06/24/22 0531 06/24/22 1647 06/25/22 0353 06/26/22 0347  ALBUMIN 2.4* 2.6* 2.5* 2.4* 2.4*    No results for input(s): "LIPASE", "AMYLASE" in the last 168 hours. No results for input(s): "AMMONIA" in the last 168 hours.  ABG    Component Value Date/Time   PHART 7.42 06/07/2022 0828   PCO2ART 60 (H) 06/07/2022 0828   PO2ART 68 (L) 06/07/2022 0828   HCO3 38.9 (H) 06/07/2022 0828   O2SAT 95.7 06/07/2022 0828     Coagulation Profile: No results for input(s): "INR", "PROTIME" in the last 168 hours.  Cardiac Enzymes: No results for input(s): "CKTOTAL", "CKMB", "CKMBINDEX", "TROPONINI" in the last 168 hours.  HbA1C: Hgb A1c MFr Bld  Date/Time Value Ref Range Status  06/07/2022 04:54 AM 9.0 (H) 4.8 - 5.6 % Final    Comment:    (NOTE) Pre diabetes:          5.7%-6.4%  Diabetes:              >6.4%  Glycemic control for   <7.0% adults with diabetes     CBG: Recent Labs  Lab 06/25/22 1121 06/25/22 1225 06/25/22 1333 06/25/22 1732 06/25/22 2259  GLUCAP 207* 167* 162* 246* 256*     Review of Systems:   Unable to assess due to intubation and sedation  Past Medical History:  She,  has a past medical history of Asthma, Diabetes mellitus without complication ([place]), and Hypertension.    Surgical History:   Past Surgical History:  Procedure Laterality Date   carpel tunnel     COLONOSCOPY WITH PROPOFOL N/A 12/08/2014   Procedure: COLONOSCOPY WITH PROPOFOL;  Surgeon: Lollie Sails, MD;  Location: [facility] ENDOSCOPY;  Service: Endoscopy;  Laterality: N/A;   ESOPHAGOGASTRODUODENOSCOPY     TRACHEOSTOMY TUBE PLACEMENT N/A 06/19/2022   Procedure: TRACHEOSTOMY;  Surgeon: Beverly Gust, MD;  Location: [facility] ORS;  Service: ENT;  Laterality: N/A;     Social History:   reports that she quit smoking about 5 years ago. Her smoking use included cigarettes. She has never used smokeless tobacco. She reports that she does not drink alcohol and does not use drugs.   Family History:  Her family history includes Breast cancer (age of onset: 55) in her paternal aunt; Cancer in her paternal grandmother.   Allergies Allergies  Allergen Reactions   Abilify [Aripiprazole] Other (See Comments)    Syncope   Effexor [Venlafaxine] Other (See Comments)    Hair Loss   Wellbutrin [Bupropion] Other (See Comments)    Acne      Home Medications  Prior to Admission medications   Medication Sig Start Date End Date Taking? Authorizing Provider  albuterol (PROVENTIL HFA;VENTOLIN HFA) 108 (90 BASE) MCG/ACT inhaler Inhale into the lungs every 6 (six) hours as needed for wheezing or shortness of breath.   Yes [provider]  atorvastatin (LIPITOR) 40 MG tablet Take 40 mg by mouth at bedtime. 05/06/20  Yes [provider]  cetirizine (ZYRTEC) 10 MG tablet Take 10 mg by mouth daily.   Yes [provider]  clindamycin (CLEOCIN T) 1 % external solution Apply 1 Application topically 2 (two) times daily.   Yes [provider]  cyanocobalamin 1000 MCG tablet Take 1,000 mcg by mouth daily.   Yes [provider]  enalapril (VASOTEC) 20 MG tablet Take 20 mg by mouth daily.   Yes [provider]  famotidine (PEPCID) 20 MG tablet Take 20 mg by mouth 2 (two)  times daily.   Yes [provider]  fluticasone (FLOVENT HFA) 110 MCG/ACT inhaler Inhale 2 puffs into the lungs 2 (two) times daily.   Yes [provider]  hydrochlorothiazide (HYDRODIURIL) 25 MG tablet Take 25 mg by mouth daily.   Yes [provider]  hydrOXYzine (ATARAX) 25 MG tablet Take 25 mg by mouth 3 (three) times daily as needed.   Yes [provider]  ipratropium (ATROVENT HFA) 17 MCG/ACT inhaler Inhale 2 puffs into the lungs every 4 (four) hours as needed for wheezing.   Yes [provider]  liraglutide (VICTOZA) 18 MG/3ML SOPN Inject 2.4 mg into the skin daily.   Yes [provider]  metFORMIN (GLUCOPHAGE-XR) 500 MG 24 hr tablet Take 1,000 mg by mouth 2 (two) times daily with a meal.   Yes [provider]  Scheduled Meds:  atorvastatin  40 mg Per Tube QHS   budesonide (PULMICORT) nebulizer solution  0.25 mg Nebulization BID   Chlorhexidine Gluconate Cloth  6 each Topical Q0600   enoxaparin (LOVENOX) injection  0.5 mg/kg Subcutaneous Q24H   feeding supplement (PROSource TF20)  60 mL Per Tube Daily   ferrous sulfate  325 mg Per Tube Q breakfast   fiber supplement (BANATROL TF)  60 mL Per Tube BID   free water  50 mL Per Tube Q4H   furosemide  40 mg Intravenous BID   insulin aspart  0-9 Units Subcutaneous Q6H   insulin glargine-yfgn  25 Units Subcutaneous BID   ipratropium-albuterol  3  mL Nebulization Q6H   multivitamin with minerals  1 tablet Per Tube Daily   mouth rinse  15 mL Mouth Rinse Q2H   sodium chloride flush  10-40 mL Intracatheter Q12H   Continuous Infusions:  sodium chloride 10 mL/hr at 06/26/22 0500   ceFEPime (MAXIPIME) IV Stopped (06/26/22 0230)   feeding supplement (VITAL 1.5 CAL) 55 mL/hr at 06/26/22 0500   insulin Stopped (06/25/22 1328)   PRN Meds:.sodium chloride, acetaminophen **OR** acetaminophen, albuterol, dextrose, ondansetron **OR** ondansetron (ZOFRAN) IV, mouth rinse, oxyCODONE,  polyethylene glycol, senna-docusate, sodium chloride flush   Critical care time: 31 minutes     Rufina Falco, DNP, CCRN, FNP-C, AGACNP-BC Acute Care & Family Nurse Practitioner  [place] for personal pager PCCM on call pager [phone] until 7 am

## 2022-06-26 NOTE — TOC Progression Note (Signed)
Transition of Care [facility]) - Progression Note    Patient Details  Name: Desiree Stone MRN: [id number] Date of Birth: 09/14/1975  [facility] CM/SW Contact  Shelbie Hutching, RN Phone Number: 06/26/2022, 2:33 PM  Clinical Narrative:    Patient is a potential candidate for [facility] inpatient rehab.  TOC will follow.  Patient is tolerating trach collar during the day, vent at night.    Expected Discharge Plan: IP Rehab Facility Barriers to Discharge: Continued Medical Work up  Expected Discharge Plan and Services   Discharge Planning Services: CM Consult Post Acute Care Choice: IP Rehab Living arrangements for the past 2 months: Single Family Home                                       Social Determinants of Health (SDOH) Interventions SDOH Screenings   Food Insecurity: No Food Insecurity (06/17/2022)  Housing: Low Risk  (06/17/2022)  Transportation Needs: No Transportation Needs (06/17/2022)  Utilities: Not At Risk (06/17/2022)  Physical Activity: Sufficiently Active (05/23/2017)  Social Connections: Moderately Isolated (05/23/2017)  Stress: No Stress Concern Present (05/23/2017)  Tobacco Use: Medium Risk (06/20/2022)    Readmission Risk Interventions     No data to display

## 2022-06-26 NOTE — Evaluation (Signed)
Physical Therapy Evaluation Patient Details Name: ZARIA TAHA MRN: [id number] DOB: [birth year] Today's Date: 06/26/2022  History of Present Illness  Patient is a 47 year old female with acute hypoxic and hypercapnic respiratory failure, acute COPD exacerbation, new onset CHF, and suspected community-acquired pneumonia requiring intubation and mechanical ventilation. History of tobacco use  Clinical Impression  Patient is agreeable to PT evaluation. 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 She drives and lives alone in an apartment. She does have family near by per her report.  Today, the patient is very motivated to participate. She is currently on trach collar trials. The patient required +2 person assistance for bed mobility with fair sitting balance demonstrated. She was able to stand from the bed with assistance. Sp02 remained in the 90's with activity with a brief decreased to 85 that increased quickly with cues for breathing techniques. Recommend to continue PT to maximize independence to facilitate return to prior level of function. Consider inpatient rehab at this time.    Recommendations for follow up therapy are one component of a multi-disciplinary discharge planning process, led by the attending physician.  Recommendations may be updated based on patient status, additional functional criteria and insurance authorization.  Follow Up Recommendations Acute inpatient rehab (3hours/day)      Assistance Recommended at Discharge Intermittent Supervision/Assistance  Patient can return home with the following  A little help with walking and/or transfers;A little help with bathing/dressing/bathroom;Help with stairs or ramp for entrance;Assist for transportation    Equipment Recommendations  (to be determined)  Recommendations for Other Services  Rehab consult    Functional Status Assessment Patient has had a recent decline in their functional status and  demonstrates the ability to make significant improvements in function in a reasonable and predictable amount of time.     Precautions / Restrictions Precautions Precautions: Fall Restrictions Weight Bearing Restrictions: No      Mobility  Bed Mobility Overal bed mobility: Needs Assistance Bed Mobility: Supine to Sit, Sit to Supine     Supine to sit: Mod assist, +2 for physical assistance Sit to supine: Mod assist, +2 for physical assistance   General bed mobility comments: verbal cues for technique. assistance for trunk and BLE support. increased time and effort required    Transfers Overall transfer level: Needs assistance Equipment used: 2 person hand held assist Transfers: Sit to/from Stand Sit to Stand: Mod assist, +2 physical assistance           General transfer comment: patient is 4'11" and barely able to touch the ground from sitting position even with mattress seat deflated. she was able to stand x 1 bout, but has posterior leg/buttock support of the bed.    Ambulation/Gait               General Gait Details: not attempted  Stairs            Wheelchair Mobility    Modified Rankin (Stroke Patients Only)       Balance Overall balance assessment: Needs assistance Sitting-balance support: Feet supported Sitting balance-Leahy Scale: Fair Sitting balance - Comments: occasional external support provided initially with improved sitting balance with increased sitting time   Standing balance support: Bilateral upper extremity supported Standing balance-Leahy Scale: Poor Standing balance comment: external support required for safety with bilateral knees blocked to prevent buckling  Pertinent Vitals/Pain Pain Assessment Pain Assessment: No/denies pain    Home Living Family/patient expects to be discharged to:: Private residence Living Arrangements: Alone Available Help at Discharge: Family Type of Home:  Apartment Home Access: Level entry       Home Layout: One level        Prior Function Prior Level of Function : Independent/Modified Independent;Driving             Mobility Comments: patient is independent with ambulation, works as a Freight forwarder, drives       Journalist, newspaper        Extremity/Trunk Assessment   Upper Extremity Assessment Upper Extremity Assessment: Defer to OT evaluation    Lower Extremity Assessment Lower Extremity Assessment: Generalized weakness (patient able to activate hio/knee/ankle movement. endurance impaired for sustianed activity)       Communication   Communication: Tracheostomy  Cognition Arousal/Alertness: Awake/alert Behavior During Therapy: WFL for tasks assessed/performed Overall Cognitive Status: Difficult to assess                                 General Comments: patient is able to follow single step commands consistently        General Comments General comments (skin integrity, edema, etc.): vitals monitored throughout session. Sp02 dropped to 85% briefly but increased back into the 90's with cues for breathing techniques. patient is very motivated to participate with PT    Exercises     Assessment/Plan    PT Assessment Patient needs continued PT services  PT Problem List Decreased strength;Decreased range of motion;Decreased activity tolerance;Decreased balance;Decreased mobility;Decreased safety awareness       PT Treatment Interventions DME instruction;Gait training;Stair training;Functional mobility training;Therapeutic exercise;Therapeutic activities;Balance training;Neuromuscular re-education;Patient/family education    PT Goals (Current goals can be found in the Care Plan section)  Acute Rehab PT Goals Patient Stated Goal: to stand PT Goal Formulation: With patient Time For Goal Achievement: 07/10/22 Potential to Achieve Goals: Good    Frequency Min 2X/week     Co-evaluation PT/OT/SLP  Co-Evaluation/Treatment: Yes Reason for Co-Treatment: Complexity of the patient's impairments (multi-system involvement);To address functional/ADL transfers;For patient/therapist safety PT goals addressed during session: Mobility/safety with mobility         AM-PAC PT "6 Clicks" Mobility  Outcome Measure Help needed turning from your back to your side while in a flat bed without using bedrails?: A Lot Help needed moving from lying on your back to sitting on the side of a flat bed without using bedrails?: Total Help needed moving to and from a bed to a chair (including a wheelchair)?: Total Help needed standing up from a chair using your arms (e.g., wheelchair or bedside chair)?: Total Help needed to walk in hospital room?: Total Help needed climbing 3-5 steps with a railing? : Total 6 Click Score: 7    End of Session Equipment Utilized During Treatment:  (trach, on trach collar trials) Activity Tolerance: Patient tolerated treatment well Patient left: in bed;with call bell/phone within reach;with SCD's reapplied Nurse Communication: Mobility status PT Visit Diagnosis: Unsteadiness on feet (R26.81);Muscle weakness (generalized) (M62.81)    Time: 1610-9604 PT Time Calculation (min) (ACUTE ONLY): 27 min   Charges:   PT Evaluation $PT Eval Moderate Complexity: 1 Mod PT Treatments $Therapeutic Activity: 8-22 mins        [name], PT, MPT   Percell Locus 06/26/2022, 11:47 AM

## 2022-06-27 DIAGNOSIS — J9602 Acute respiratory failure with hypercapnia: Secondary | ICD-10-CM | POA: Diagnosis not present

## 2022-06-27 DIAGNOSIS — J9601 Acute respiratory failure with hypoxia: Secondary | ICD-10-CM | POA: Diagnosis not present

## 2022-06-27 LAB — BASIC METABOLIC PANEL
Anion gap: 12 (ref 5–15)
BUN: 20 mg/dL (ref 6–20)
CO2: 31 mmol/L (ref 22–32)
Calcium: 9.1 mg/dL (ref 8.9–10.3)
Chloride: 99 mmol/L (ref 98–111)
Creatinine, Ser: 0.42 mg/dL — ABNORMAL LOW (ref 0.44–1.00)
GFR, Estimated: >60 mL/min (ref >60)
Glucose, Bld: 232 mg/dL — ABNORMAL HIGH (ref 70–99)
Potassium: 3.8 mmol/L (ref 3.5–5.1)
Sodium: 142 mmol/L (ref 135–145)

## 2022-06-27 LAB — CBC
HCT: 38 % (ref 36.0–46.0)
Hemoglobin: 10.2 g/dL — ABNORMAL LOW (ref 12.0–15.0)
MCH: 19.9 pg — ABNORMAL LOW (ref 26.0–34.0)
MCHC: 26.8 g/dL — ABNORMAL LOW (ref 30.0–36.0)
MCV: 74.2 fL — ABNORMAL LOW (ref 80.0–100.0)
Platelets: 392 10*3/uL (ref 150–400)
RBC: 5.12 MIL/uL — ABNORMAL HIGH (ref 3.87–5.11)
RDW: 26.8 % — ABNORMAL HIGH (ref 11.5–15.5)
WBC: 14.2 10*3/uL — ABNORMAL HIGH (ref 4.0–10.5)
nRBC: 0 % (ref 0.0–0.2)

## 2022-06-27 LAB — GLUCOSE, CAPILLARY
Glucose-Capillary: 205 mg/dL — ABNORMAL HIGH (ref 70–99)
Glucose-Capillary: 226 mg/dL — ABNORMAL HIGH (ref 70–99)
Glucose-Capillary: 233 mg/dL — ABNORMAL HIGH (ref 70–99)
Glucose-Capillary: 224 mg/dL — ABNORMAL HIGH (ref 70–99)

## 2022-06-27 LAB — MAGNESIUM: Magnesium: 2.2 mg/dL (ref 1.7–2.4)

## 2022-06-27 LAB — PHOSPHORUS: Phosphorus: 3.7 mg/dL (ref 2.5–4.6)

## 2022-06-27 MED ORDER — GLUCERNA 1.5 CAL PO LIQD
1000.0000 mL | ORAL | Status: DC
  Administered 2022-06-27 – 2022-07-03 (×4): 1000 mL

## 2022-06-27 MED ORDER — JUVEN PO PACK
1.0000 | PACK | Freq: Two times a day (BID) | ORAL | Status: DC
  Administered 2022-06-27 – 2022-06-28 (×2): 1

## 2022-06-27 MED ORDER — MAGIC MOUTHWASH W/LIDOCAINE
5.0000 mL | Freq: Three times a day (TID) | ORAL | Status: DC | PRN
  Administered 2022-07-03: 5 mL via ORAL
  Filled 2022-06-27 (×3): qty 5

## 2022-06-27 MED ORDER — FAMOTIDINE 20 MG PO TABS
20.0000 mg | ORAL_TABLET | Freq: Two times a day (BID) | ORAL | Status: DC
  Administered 2022-06-27 (×2): 20 mg
  Filled 2022-06-27 (×2): qty 1

## 2022-06-27 MED ORDER — ADULT MULTIVITAMIN LIQUID CH: 15.0000 mL | Freq: Every day | ORAL | Status: DC

## 2022-06-27 MED ORDER — LOPERAMIDE HCL 1 MG/7.5ML PO SUSP
2.0000 mg | ORAL | Status: DC | PRN
  Administered 2022-06-27 (×2): 2 mg
  Filled 2022-06-27 (×3): qty 15

## 2022-06-27 MED ORDER — INSULIN ASPART 100 UNIT/ML IJ SOLN
7.0000 [IU] | INTRAMUSCULAR | Status: DC
  Administered 2022-06-27 – 2022-07-05 (×34): 7 [IU] via SUBCUTANEOUS
  Filled 2022-06-27 (×32): qty 1

## 2022-06-27 MED ORDER — GERHARDT'S BUTT CREAM
TOPICAL_CREAM | Freq: Four times a day (QID) | CUTANEOUS | Status: DC | PRN
  Administered 2022-07-01: 1 via TOPICAL
  Filled 2022-06-27 (×2): qty 1

## 2022-06-27 MED ORDER — INSULIN GLARGINE-YFGN 100 UNIT/ML ~~LOC~~ SOLN
30.0000 [IU] | Freq: Two times a day (BID) | SUBCUTANEOUS | Status: DC
  Administered 2022-06-27 – 2022-07-05 (×15): 30 [IU] via SUBCUTANEOUS
  Filled 2022-06-27 (×18): qty 0.3

## 2022-06-27 NOTE — Progress Notes (Signed)
Pt's mother updated bedside.  All questions answered.    Darel Hong, AGACNP-BC [facility] Prefer epic messenger for cross cover needs If after hours, please call E-link

## 2022-06-27 NOTE — Progress Notes (Signed)
Physical Therapy Treatment Patient Details Name: Desiree Stone MRN: [id number] DOB: 1975/08/13 Today's Date: 06/27/2022   History of Present Illness Patient is a 47 year old female with acute hypoxic and hypercapnic respiratory failure, acute COPD exacerbation, new onset CHF, and suspected community-acquired pneumonia requiring intubation and mechanical ventilation. History of tobacco use    PT Comments    Patient was agreeable to PT. She was in the chair position in bed on arrival to room, awake, alert, and eager to mobilize. The patient continues to require +2 person assistance with bed mobility. Standing x 1 bout with assistance with further mobility limited by bladder and bowel incontinence. No dizziness reported with upright activity and vitals stable throughout session. Recommend to continue PT to maximize independence and decrease caregiver burden.    Recommendations for follow up therapy are one component of a multi-disciplinary discharge planning process, led by the attending physician.  Recommendations may be updated based on patient status, additional functional criteria and insurance authorization.  Follow Up Recommendations  Acute inpatient rehab (3hours/day)     Assistance Recommended at Discharge Intermittent Supervision/Assistance  Patient can return home with the following A little help with walking and/or transfers;A little help with bathing/dressing/bathroom;Help with stairs or ramp for entrance;Assist for transportation   Equipment Recommendations   (to be determined at next level of care)    Recommendations for Other Services Rehab consult     Precautions / Restrictions Precautions Precautions: Fall Restrictions Weight Bearing Restrictions: No     Mobility  Bed Mobility Overal bed mobility: Needs Assistance Bed Mobility: Supine to Sit, Sit to Supine, Rolling Rolling: Max assist   Supine to sit: Mod assist, +2 for physical assistance Sit to supine: Mod  assist, +2 for physical assistance   General bed mobility comments: patient assisted to sitting position and found to be incontinent of bowel. assisted patient back to supine for peri-hygiene and assisted patient back to sitting again. she required +2 person assistance with more assisted needed for upright trunk support when head of bed was flat. maximal assistance for rolling in bed for bed pan placement with cues for technique.    Transfers Overall transfer level: Needs assistance Equipment used: 2 person hand held assist Transfers: Sit to/from Stand Sit to Stand: Mod assist, +2 physical assistance           General transfer comment: again, patient has difficulty with short stature and bed height even with mattress deflated. she was able to stand x 1 bout with bed providing posterior buttock/leg support while standing. patient assisted back to bed after standing due to further bowel/bladder incontinence.    Ambulation/Gait                   Stairs             Wheelchair Mobility    Modified Rankin (Stroke Patients Only)       Balance Overall balance assessment: Needs assistance Sitting-balance support: Feet supported Sitting balance-Leahy Scale: Poor Sitting balance - Comments: right lateral lean, likely due to air mattress settings. with feet support, sitting balance is fair                                    Cognition Arousal/Alertness: Awake/alert Behavior During Therapy: WFL for tasks assessed/performed Overall Cognitive Status: Difficult to assess  General Comments: patient is able to follow single step commands consistently        Exercises      General Comments        Pertinent Vitals/Pain Pain Assessment Pain Assessment: No/denies pain    Home Living                          Prior Function            PT Goals (current goals can now be found in the care plan  section) Acute Rehab PT Goals Patient Stated Goal: to stand PT Goal Formulation: With patient Time For Goal Achievement: 07/10/22 Potential to Achieve Goals: Good Progress towards PT goals: Progressing toward goals    Frequency    Min 2X/week      PT Plan Current plan remains appropriate    Co-evaluation PT/OT/SLP Co-Evaluation/Treatment: Yes Reason for Co-Treatment: Complexity of the patient's impairments (multi-system involvement);For patient/therapist safety;To address functional/ADL transfers PT goals addressed during session: Mobility/safety with mobility        AM-PAC PT "6 Clicks" Mobility   Outcome Measure  Help needed turning from your back to your side while in a flat bed without using bedrails?: A Lot Help needed moving from lying on your back to sitting on the side of a flat bed without using bedrails?: Total Help needed moving to and from a bed to a chair (including a wheelchair)?: Total Help needed standing up from a chair using your arms (e.g., wheelchair or bedside chair)?: Total Help needed to walk in hospital room?: Total Help needed climbing 3-5 steps with a railing? : Total 6 Click Score: 7    End of Session Equipment Utilized During Treatment: Oxygen (trach mask oxygen) Activity Tolerance: Patient tolerated treatment well Patient left: in bed;with call bell/phone within reach Nurse Communication: Mobility status;Need for lift equipment PT Visit Diagnosis: Unsteadiness on feet (R26.81);Muscle weakness (generalized) (M62.81)     Time: 1975-8832 PT Time Calculation (min) (ACUTE ONLY): 36 min  Charges:  $Therapeutic Activity: 8-22 mins                     Desiree Stone, PT, MPT    Desiree Stone 06/27/2022, 12:01 PM

## 2022-06-27 NOTE — Progress Notes (Addendum)
Nutrition Follow Up Note   DOCUMENTATION CODES:   Morbid obesity  INTERVENTION:   Change to Glucerna 1.5'@65ml'$ /hr- Initiate at 36m/hr and increase by 121mhr q 8 hours until goal rate is reached.   Free water flushes 5030m4 hours to maintain tube patency   Regimen provides 2340kcal/day, 129g/day protein and 1484m5my of free water.   Daily weights   Add Juven Fruit Punch BID via tube, each serving provides 95kcal and 2.5g of protein (amino acids glutamine and arginine)  NUTRITION DIAGNOSIS:   Inadequate oral intake related to inability to eat (pt sedated and ventilated) as evidenced by NPO status. -ongoing   GOAL:   Patient will meet greater than or equal to 90% of their needs -met   MONITOR:   Diet advancement, Labs, Weight trends, TF tolerance, I & O's, Skin  ASSESSMENT:   46 y73 female with h/o OSA, HTN, DM, asthma, morbid obesity and uterine fibroid who is admitted with new COPD and CHF. Pt with new PNA.  Pt s/p tracheostomy 1/29.   Pt on TC during the day and vent at night. NGT remains in place. Pt is tolerating tube feeds well. Pt continues to have hyperglycemia; RD will change pt to Glucerna. MD is wanting to hold on IR G-tube for now as pt is more alert and and may be able to be placed on a diet. SLP is following for PMSV. Plan is for possible CIR. Pt with new MASD; RD will add Juven. Per chart, pt is weight stable since admission.   Medications reviewed and include: lovenox, pepcid, ferrous sulfate, lasix, insulin  Labs reviewed: K 3.8 wnl, creat 0.42(L), Mg 3.7 wnl, Mg 2.2 wnl Wbc- 14.2(H), Hgb 10.2(L) Cbgs- 225, 205 x 24 hrs  UOP- 2000ml56met Order:   Diet Order             Diet NPO time specified  Diet effective midnight                  EDUCATION NEEDS:   No education needs have been identified at this time  Skin:  Skin Assessment: Reviewed RN Assessment (MASD buttocks, incision neck)  Last BM:  2/6- TYPE 6  Height:   Ht Readings  from Last 1 Encounters:  06/08/22 '4\' 11"'$  (1.499 m)    Weight:   Wt Readings from Last 1 Encounters:  06/27/22 135 kg    Ideal Body Weight:  44.5 kg  BMI:  Body mass index is 60.11 kg/m.  Estimated Nutritional Needs:   Kcal:  2400-2700kcal/day  Protein:  120-135g/day  Fluid:  1.4-1.6L/day  CaseyKoleen DistanceRD, LDN Please refer to [facility]ION[facility] CenterRD and/or RD on-call/weekend/after hours pager

## 2022-06-27 NOTE — Inpatient Diabetes Management (Signed)
Inpatient Diabetes Program Recommendations  AACE/ADA: New Consensus Statement on Inpatient Glycemic Control  Target Ranges:  Prepandial:   less than 140 mg/dL      Peak postprandial:   less than 180 mg/dL (1-2 hours)      Critically ill patients:  140 - 180 mg/dL    Latest Reference Range & Units 06/27/22 03:52 06/27/22 08:22  Glucose-Capillary 70 - 99 mg/dL 205 (H) 226 (H)    Latest Reference Range & Units 06/26/22 06:12 06/26/22 12:36 06/26/22 17:17 06/26/22 20:48 06/26/22 23:05  Glucose-Capillary 70 - 99 mg/dL 240 (H) 241 (H) 234 (H) 262 (H) 226 (H)   Review of Glycemic Control  Diabetes history: DM2 Outpatient Diabetes medications: Victoza 2.4 mg daily, Metformin 1000 mg BID Current orders for Inpatient glycemic control: Semglee 25 units BID, Novolog 0-9 units Q4H, Novolog 4 units Q4H; Vital @ 55 ml/hr  Inpatient Diabetes Program Recommendations:    Insulin: Please consider increasing Semglee 30 units BID and tube feeding coverage to Novolog 7 units Q4H.  Thanks, Barnie Alderman, RN, MSN, [employer] [facility] Coordinator Inpatient Diabetes Program [phone] (Team Pager from 8am to [place])

## 2022-06-27 NOTE — Consult Note (Signed)
PHARMACY CONSULT NOTE - ELECTROLYTES  Pharmacy Consult for Electrolyte Monitoring and Replacement   Recent Labs: Potassium (mmol/L)  Date Value  06/27/2022 3.8   Magnesium (mg/dL)  Date Value  06/27/2022 2.2   Calcium (mg/dL)  Date Value  06/27/2022 9.1   Albumin (g/dL)  Date Value  06/26/2022 2.4 (L)   Phosphorus (mg/dL)  Date Value  06/27/2022 3.7   Sodium (mmol/L)  Date Value  06/27/2022 142   Assessment  Desiree Stone is a 47 y.o. female presenting with AHRF. PMH significant for asthma, T2DM, HTN, OSA, tobacco use (32 pack-years), obesity. Pharmacy has been consulted to monitor and replace electrolytes while in CCU.  Diet/feeding: changed to Glucerna 1.5'@65ml'$ /hr- Initiate at 45m/hr and increase by 13mhr q 8 hours until goal rate is reached   Pertinent medications:  furosemide IV 40 mg BID  Goal of Therapy: Electrolytes WNL  Plan:  - no electrolyte replacement warranted for today - Will recheck electrolytes with AM labs  Thank you for allowing pharmacy to be a part of this patient's care.  RoDallie PilesPharmD, BCPS Clinical Pharmacist   06/27/2022 7:04 AM

## 2022-06-27 NOTE — Progress Notes (Signed)
Occupational Therapy Treatment Patient Details Name: Desiree Stone MRN: [id number] DOB: 15-Jan-1976 Today's Date: 06/27/2022   History of present illness Patient is a 47 year old female with acute hypoxic and hypercapnic respiratory failure, acute COPD exacerbation, new onset CHF, and suspected community-acquired pneumonia requiring intubation and mechanical ventilation. History of tobacco use   OT comments  Pt seen for skilled co-treatment with PT with continued focus on self care, functional mobility, endurance, and strengthening. Pt continues to be very motivated and requesting to transfer to recliner chair. Mod A of 2 for bed mobility. Once on EOB, pt noted to have been incontinent of BM and returning to side lying position and total A for hygiene. +2 assistance for trunk support to return to EOB and pt stands with +2 assistance on EOB. Pt then becomes incontinent of bowel and bladder and needing to return to supine for bed pan. OT notified RN of pt on bed pan with plan for RN to assist pt to recliner chair with use of hoyer lift once finished for safety. Pt continues to make progress with recommendation for inpatient rehab after hospital discharge to address functional deficits before returning home.    Recommendations for follow up therapy are one component of a multi-disciplinary discharge planning process, led by the attending physician.  Recommendations may be updated based on patient status, additional functional criteria and insurance authorization.    Follow Up Recommendations  Acute inpatient rehab (3hours/day)     Assistance Recommended at Discharge Frequent or constant Supervision/Assistance  Patient can return home with the following  Two people to help with walking and/or transfers;A lot of help with bathing/dressing/bathroom;Assist for transportation;Assistance with cooking/housework;Help with stairs or ramp for entrance   Equipment Recommendations  Other (comment)        Precautions / Restrictions Precautions Precautions: Fall Restrictions Weight Bearing Restrictions: No       Mobility Bed Mobility Overal bed mobility: Needs Assistance Bed Mobility: Supine to Sit, Sit to Supine, Rolling Rolling: Max assist   Supine to sit: Mod assist, +2 for physical assistance Sit to supine: Mod assist, +2 for physical assistance   General bed mobility comments: patient assisted to sitting position and found to be incontinent of bowel. assisted patient back to supine for peri-hygiene and assisted patient back to sitting again. she required +2 person assistance with more assisted needed for upright trunk support when head of bed was flat. maximal assistance for rolling in bed for bed pan placement with cues for technique.    Transfers Overall transfer level: Needs assistance Equipment used: 2 person hand held assist Transfers: Sit to/from Stand Sit to Stand: Mod assist, +2 physical assistance           General transfer comment: again, patient has difficulty with short stature and bed height even with mattress deflated. she was able to stand x 1 bout with bed providing posterior buttock/leg support while standing. patient assisted back to bed after standing due to further bowel/bladder incontinence.     Balance Overall balance assessment: Needs assistance Sitting-balance support: Feet supported Sitting balance-Leahy Scale: Poor Sitting balance - Comments: right lateral lean, likely due to air mattress settings. with feet support, sitting balance is fair   Standing balance support: Bilateral upper extremity supported Standing balance-Leahy Scale: Poor Standing balance comment: external support required for safety with bilateral knees blocked to prevent buckling  ADL either performed or assessed with clinical judgement   ADL Overall ADL's : Needs assistance/impaired                                        General ADL Comments: total A for hygiene during bowel incontinence    Extremity/Trunk Assessment Upper Extremity Assessment Upper Extremity Assessment: Generalized weakness   Lower Extremity Assessment Lower Extremity Assessment: Generalized weakness        Vision Patient Visual Report: No change from baseline            Cognition Arousal/Alertness: Awake/alert Behavior During Therapy: WFL for tasks assessed/performed Overall Cognitive Status: Difficult to assess                                 General Comments: patient is able to follow single step commands consistently                   Pertinent Vitals/ Pain       Pain Assessment Pain Assessment: No/denies pain         Frequency  Min 3X/week        Progress Toward Goals  OT Goals(current goals can now be found in the care plan section)  Progress towards OT goals: Progressing toward goals  Acute Rehab OT Goals Patient Stated Goal: to get better OT Goal Formulation: With patient Time For Goal Achievement: 07/10/22 Potential to Achieve Goals: Good  Plan Discharge plan remains appropriate;Frequency remains appropriate    Co-evaluation    PT/OT/SLP Co-Evaluation/Treatment: Yes Reason for Co-Treatment: Complexity of the patient's impairments (multi-system involvement);For patient/therapist safety;To address functional/ADL transfers PT goals addressed during session: Mobility/safety with mobility OT goals addressed during session: ADL's and self-care;Proper use of Adaptive equipment and DME      AM-PAC OT "6 Clicks" Daily Activity     Outcome Measure   Help from another person eating meals?: None Help from another person taking care of personal grooming?: A Little Help from another person toileting, which includes using toliet, bedpan, or urinal?: Total Help from another person bathing (including washing, rinsing, drying)?: A Lot Help from another person to put on and taking off  regular upper body clothing?: A Little Help from another person to put on and taking off regular lower body clothing?: Total 6 Click Score: 14    End of Session    OT Visit Diagnosis: Unsteadiness on feet (R26.81);Repeated falls (R29.6);Muscle weakness (generalized) (M62.81)   Activity Tolerance Patient tolerated treatment well   Patient Left in bed;with call bell/phone within reach;with bed alarm set   Nurse Communication Mobility status;Other (comment) (Pt on bed pan and plan for RN to hoyer lift pt to chair)        Time: 6761-9509 OT Time Calculation (min): 36 min  Charges: OT General Charges $OT Visit: 1 Visit OT Treatments $Self Care/Home Management : 8-22 mins  Darleen Crocker, MS, OTR/L , CBIS ascom [phone]  06/27/22, 1:27 PM

## 2022-06-27 NOTE — Progress Notes (Signed)
Inpatient Rehab Admissions Coordinator:   Consult received and chart reviewed. Note still requiring vent at night, which we cannot manage on CIR.  Will follow for progress with therapy and liberation from the vent.   Shann Medal, PT, DPT Admissions Coordinator [phone] 06/27/22  4:06 PM

## 2022-06-27 NOTE — Progress Notes (Signed)
NAME:  Desiree Stone, MRN:  [id number], DOB:  [birth year], LOS: 61 ADMISSION DATE:  06/06/2022, CONSULTATION DATE:  06/07/2022 REFERRING MD:  Dr. Tamala Julian, CHIEF COMPLAINT:  Shortness of breath   Brief Pt Description / Synopsis:  47 year old female admitted with acute hypoxic and hypercapnic respiratory failure in the setting of acute COPD exacerbation, new onset CHF, and suspected community-acquired pneumonia requiring intubation and mechanical ventilation.  History of Present Illness:  47 yo F presenting to [facility] ED from home with complaints of increasing shortness of breath. History provided per chart review, some items confirmed with her mother via phone interview. Patient reported on arrival to increased dyspnea over the past few months, worsening acutely in the last couple of days. She stated she could hardly walk down the hallway at the school and had to stop and rest. She denied outpatient oxygen use and has been unable to use her CPAP as she is unable to sleep in her bed. She instead has been sleeping in her chair for the past several weeks. She endorsed continued cigarette smoking. In addition she reported a productive cough with thick sputum and palpitations, as well as lower extremity edema. She denied difficulty swallowing, increased abdominal distention, or heart failure history. 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 Lab work significant for mild hyponatremia, hyperglycemia, leukocytosis, elevated but flat troponin and mildly elevated BNP. She was negative for COVID, RSV & influenza. Her respiratory status deteriorated and she was requiring 4 L [place]. CTa imaging was negative for PE, but did reveal cardiomegaly and an enlarged pulmonary artery suggestive of pulmonary hypertension. TRH was consulted for admission. The  patient's respiratory status did not improve while waiting for bed placement in the ED, with ABG demonstrating respiratory acidosis. The patient was placed on BIPAP. Follow up ABG's showed persistent respiratory acidosis even with setting adjustments. Eventually the decision was made to urgently intubate the patient and place her on mechanical ventilatory support. Medications given: lasix 40 mg, IV contrast, fentanyl, rocuronium & etomidate, lovenox, duoneb, solu medrol Initial Vitals: 98.4, 24, 98, 136/99 & 90% on 2 L [place] Significant labs: (Labs/ Imaging personally reviewed) I, Domingo Pulse [place], AGACNP-BC, personally viewed and interpreted this ECG. EKG Interpretation: Date: 06/06/22, EKG Time: 12:29, Rate: 104, Rhythm: ST, QRS Axis:  LAD, Intervals: normal, ST/T Wave abnormalities: non specific T wave abnormalities, Narrative Interpretation: ST with LAD and non specific T wave abnormalities Chemistry: Na+: 134, K+: 3.9, BUN/Cr.: 6/ 0.52, Serum CO2/ AG: 31/10, Cl: 93   CXR 06/06/22: no active cardiopulmonary disease. No evidence of pneumonia or pulmonary edema. Bronchitis/reactive airway disease, likely chronic. Borderline cardiomegaly CT soft tissue neck w contrast 06/06/22: no acute finding in the neck CT angio chest PE 06/06/22: no large central PE. The distal pulmonary arteries are not well evaluated due to the patient body habitus. No other evidence of acute cardiopulmonary pathology. Cardiomegaly with enlarged pulmonary artery suggesting pulmonary hypertension and refluxed contrast into the IVC, suggesting right heart dysfunction. Left adrenal nodule has been present since 2012 consistent with a benign adenoma.   PCCM consulted for admission due to worsening acute respiratory failure suspect multifocal secondary to acute new onset heart failure & suspected COPD in the setting of suspected pulmonary hypertension requiring emergent intubation and mechanical ventilatory support.  Please see  "significant  hospital events" section below for full detailed hospital course.  Pertinent  Medical History   Past Medical History:  Diagnosis Date   Asthma    Diabetes mellitus without complication ([place])    Hypertension     Micro Data:  1/16: SARS-CoV-2/influenza/RSV PCR>> negative 1/16: Group A strep PCR>> negative 1/17: MRSA PCR>> negative 1/17: HIV screen>> nonreactive 1/18: Blood culture x 2>> no growth  1/18: Tracheal aspirate>> normal respiratory flora 1/29: MRSA PCR>>negative 1/31: Repeat tracheal aspirate>> normal respiratory flora  Antimicrobials:   Anti-infectives (From admission, onward)    Start     Dose/Rate Route Frequency Ordered Stop   06/21/22 1015  ceFEPIme (MAXIPIME) 2 g in sodium chloride 0.9 % 100 mL IVPB  Status:  Discontinued        2 g 200 mL/hr over 30 Minutes Intravenous Every 8 hours 06/21/22 0921 06/26/22 1007   06/13/22 2200  Ampicillin-Sulbactam (UNASYN) 3 g in sodium chloride 0.9 % 100 mL IVPB        3 g 200 mL/hr over 30 Minutes Intravenous Every 6 hours 06/13/22 1243 06/16/22 2150   06/12/22 0500  linezolid (ZYVOX) tablet 600 mg  Status:  Discontinued        600 mg Per Tube Every 12 hours 06/11/22 2301 06/13/22 1243   06/11/22 1400  piperacillin-tazobactam (ZOSYN) IVPB 3.375 g  Status:  Discontinued        3.375 g 12.5 mL/hr over 240 Minutes Intravenous Every 8 hours 06/11/22 1131 06/13/22 1243   06/11/22 1230  linezolid (ZYVOX) tablet 600 mg  Status:  Discontinued        600 mg Per Tube Every 12 hours 06/11/22 1131 06/11/22 2301   06/09/22 1100  cefTRIAXone (ROCEPHIN) 2 g in sodium chloride 0.9 % 100 mL IVPB  Status:  Discontinued        2 g 200 mL/hr over 30 Minutes Intravenous Every 24 hours 06/09/22 1006 06/11/22 1129   06/07/22 1100  azithromycin (ZITHROMAX) tablet 250 mg        250 mg Per Tube Daily 06/07/22 1006 06/11/22 0843       Significant Hospital Events: Including procedures, antibiotic start and stop dates in addition to  other pertinent events   06/07/22: Admit to ICU with worsening acute respiratory failure suspect secondary to acute new onset heart failure int he setting of suspected pulmonary hypertension & chronic COPD, OSA, asthma requiring emergent intubation and mechanical ventilatory support.  06/08/22- patient is s/p SBT with severe secretions, she did diurese >800cc today with lasix challenge, CXR with progressive pleural effusions. She has family coming in today. She's uisng OG and weve tranistioned to prednisone taper from solumedrol. Adding aldactone per tube. Iron workup today.  06/09/22-on minimal vent support, will attempt SBT.  Tracheal aspirate growing gram-positive cocci, ceftriaxone added 1/20 extubated and failed, re-intubated 1/21 severe COPD, remains on vent 1/22 severe COPD, remains on vent 1/23 severe upper airway swelling, ENT consulted 1/24 severe resp failure 1/26: Remains on vent, hemodynamically stable, tentative plan for Trach next Monday 1/29. 1/27: Remains on vent, no acute changes or events, awaiting Trach on Monday. D/C Solumedrol  1/28: Remains on vent, no acute changes.  BP soft, Lasix decreased to 40 mg daily. Low grade temperature overnight (T max 100), Obtain CT Chest 1/28: CT Chest>>Bilateral lower lobe consolidation, and right middle lobe       subsegmental atelectasis. No evidence of central endobronchial obstruction.       no evidence of pleural effusion. 2/01:  CT Abd>>Portion of the transverse colon overlies the distal stomach.       Consider visualization of the transverse colon during percutaneous       gastrostomy tube placement. Persistent consolidation in both lower lobes.       Chronic left adrenal nodule. Minimal change since 2011 and likely a benign lesion.       Gallbladder distention without inflammatory changes. 2/02: Tolerated trach collar throughout the day  2/02: Speech therapy consulted for PMV trial, however pt did not tolerate PMV placement due to  inability to redirect airflow superiorly for phonation and breathing.  Possibly will require trach downsize when deemed appropriate 2/04: No acute events overnight.  Currently tolerating trach collar  2/05: Remains on the vent at night and TC during the day. Tolerating TC Trials 2/06: No acute vents, tolerating TC during the day and resting on vent at night.  Speech therapy/PT/OT following.  Re-consult ENT to evaluate for downsizing of Trach.   Interim History / Subjective:  -No acute events noted overnight -Fevers resolved, hemodynamically stable, no vasopressors, labs stable -Continues to tolerate vent at night with TC trials during the day -PT/OT/Speech therapy following -Will re-consult ENT to evaluate for downsizing of Trach -Tolerating diuresis with 40 mg IV Lasix BID~ Creatinine remains normal, 2 L of UOP preceding 24 hrs (net + 11.7 L)  Objective   Blood pressure (!) 142/77, pulse 97, temperature 99 F (37.2 C), resp. rate (!) 29, height '4\' 11"'$  (1.499 m), weight 135 kg, last menstrual period 05/30/2022, SpO2 95 %.    Vent Mode: PSV FiO2 (%):  [35 %-40 %] 40 % PEEP:  [5 cmH20] 5 cmH20 Pressure Support:  [8 cmH20] 8 cmH20 Plateau Pressure:  [17 cmH20-29 cmH20] 22 cmH20   Intake/Output Summary (Last 24 hours) at 06/27/2022 0829 Last data filed at 06/27/2022 0600 Gross per 24 hour  Intake 1459.82 ml  Output 2000 ml  Net -540.18 ml    Filed Weights   06/20/22 0417 06/25/22 0500 06/27/22 0500  Weight: 130.5 kg 135.6 kg 135 kg    Examination: General: Acute on chronically obese female, sitting in bed, NAD on trach collar  HENT: Atraumatic, normocephalic, neck supple, difficult to assess JVD due to body habitus Lungs: Distant diminished breath sounds throughout, even, non labored, size 8 xlt midline dry/intact   Cardiovascular: Sinus tachycardia, s1s2 no m/r/g, 2+ radial/2+ distal pulses, trace bilateral lower extremity edema  Abdomen: +BS x4, obese, soft, non distended, non  tender  Extremities: No deformities, normal bulk and tone, moves all extremities  Neuro: Awake and following commands.  PERRLA GU: External female catheter in place draining yellow urine  Resolved Hospital Problem list     Assessment & Plan:   #Acute hypoxic and hypercapnic respiratory failure in the setting of acute COPD exacerbation, new onset CHF, and pneumonia PMHx: OSA on CPAP, asthma, tobacco use S/p Tracheostomy - SBT/TCT as tolerated  - Maintain O2 sats 88 to 92% - Follow intermittent Chest X-ray & ABG as needed - Spontaneous Breathing Trials when respiratory parameters met and mental status permits - Continue VAP Bundle - Complete course of ABX as above - Bronchodilators - Diuresis as renal function and blood pressure permits ~ currently on 40 mg IV Lasix BID  #Decompensated CHF  #Elevated Troponin secondary to demand ischemia PMHx: HTN, HLD Patient reports orthopnea, dyspnea on exertion & BLE edema. CTa suggestive of Right heart dysfunction and pulmonary HTN -Echocardiogram 06/07/22: LVEF 60 to 02%, normal diastolic parameters, RV  function normal - Continuous cardiac monitoring - Maintain MAP >65 - Diuresis as renal function and blood pressure permits~pt currently net positive continue lasix bid   #HAP ~ TREATED - Trend WBC and monitor fever curve - Trend PCT - Completed course of ABX as above  #Hypokalemia ~ RESOLVED -Monitor I&O's / urinary output -Follow BMP -Ensure adequate renal perfusion -Avoid nephrotoxic agents as able -Replace electrolytes as indicated ~ Pharmacy following for assistance  #Diarrhea - GI panel results pending   #Type II diabetes mellitus~difficult to control   -CBG's q4h; Target range of 140 to 180 -SSI and basal insulin -Follow ICU Hypo/Hyperglycemia protocol -Diabetes Coordinator following, appreciate input  Acute metabolic encephalopathy~resolved -Provide supportive care -Promote normal sleep/wake cycle and family  presence -Mobilize as able with PT/OT     Best Practice (right click and "Reselect all SmartList Selections" daily)   Diet/type: tubefeeds DVT prophylaxis: Lovenox GI prophylaxis: N/A Lines: PICC line, and is still needed Foley:  N/A Code Status:  full code Last date of multidisciplinary goals of care discussion [06/27/2022]  02/6: Updated pt regarding current plan of care, all questions answered  Labs   CBC: Recent Labs  Lab 06/23/22 0441 06/24/22 0531 06/25/22 0353 06/26/22 0347 06/27/22 0604  WBC 22.3* 20.4* 16.6* 14.4* 14.2*  HGB 9.1* 9.5* 9.2* 9.1* 10.2*  HCT 34.3* 36.0 34.4* 34.0* 38.0  MCV 77.6* 75.5* 74.1* 74.2* 74.2*  PLT 250 305 322 333 392     Basic Metabolic Panel: Recent Labs  Lab 06/23/22 0441 06/24/22 0531 06/24/22 1647 06/25/22 0353 06/26/22 0347 06/27/22 0604  NA 136 140 141 141 142 142  K 4.1 3.9 3.9 3.4* 3.9 3.8  CL 102 104 103 104 104 99  CO2 '29 30 30 30 29 31  '$ GLUCOSE 202* 186* 143* 180* 244* 232*  BUN '16 20 20 '$ 21* 20 20  CREATININE 0.46 0.49 0.45 0.53 0.45 0.42*  CALCIUM 7.6* 8.6* 8.3* 8.3* 8.5* 9.1  MG 2.0 2.2  --  2.3 2.1 2.2  PHOS 3.2 2.5 3.5 3.4 3.1 3.7    GFR: Estimated Creatinine Clearance: 108.5 mL/min (A) (by C-G formula based on SCr of 0.42 mg/dL (L)). Recent Labs  Lab 06/21/22 0431 06/22/22 0438 06/23/22 0441 06/24/22 0531 06/25/22 0353 06/26/22 0347 06/27/22 0604  PROCALCITON <0.10  --  0.37  --   --   --   --   WBC 15.4*   < > 22.3* 20.4* 16.6* 14.4* 14.2*   < > = values in this interval not displayed.     Liver Function Tests: Recent Labs  Lab 06/23/22 0441 06/24/22 0531 06/24/22 1647 06/25/22 0353 06/26/22 0347  ALBUMIN 2.4* 2.6* 2.5* 2.4* 2.4*    No results for input(s): "LIPASE", "AMYLASE" in the last 168 hours. No results for input(s): "AMMONIA" in the last 168 hours.  ABG    Component Value Date/Time   PHART 7.42 06/07/2022 0828   PCO2ART 60 (H) 06/07/2022 0828   PO2ART 68 (L) 06/07/2022  0828   HCO3 38.9 (H) 06/07/2022 0828   O2SAT 95.7 06/07/2022 0828     Coagulation Profile: No results for input(s): "INR", "PROTIME" in the last 168 hours.  Cardiac Enzymes: No results for input(s): "CKTOTAL", "CKMB", "CKMBINDEX", "TROPONINI" in the last 168 hours.  HbA1C: Hgb A1c MFr Bld  Date/Time Value Ref Range Status  06/07/2022 04:54 AM 9.0 (H) 4.8 - 5.6 % Final    Comment:    (NOTE) Pre diabetes:  5.7%-6.4%  Diabetes:              >6.4%  Glycemic control for   <7.0% adults with diabetes     CBG: Recent Labs  Lab 06/26/22 1717 06/26/22 2048 06/26/22 2305 06/27/22 0352 06/27/22 0822  GLUCAP 234* 262* 226* 205* 226*     Review of Systems:   Positives in BOLD: Pt currently denies all complaints Gen: Denies fever, chills, weight change, fatigue, night sweats HEENT: Denies blurred vision, double vision, hearing loss, tinnitus, sinus congestion, rhinorrhea, sore throat, neck stiffness, dysphagia PULM: Denies shortness of breath, cough, sputum production, hemoptysis, wheezing CV: Denies chest pain, edema, orthopnea, paroxysmal nocturnal dyspnea, palpitations GI: Denies abdominal pain, nausea, vomiting, diarrhea, hematochezia, melena, constipation, change in bowel habits GU: Denies dysuria, hematuria, polyuria, oliguria, urethral discharge Endocrine: Denies hot or cold intolerance, polyuria, polyphagia or appetite change Derm: Denies rash, dry skin, scaling or peeling skin change Heme: Denies easy bruising, bleeding, bleeding gums Neuro: Denies headache, numbness, weakness, slurred speech, loss of memory or consciousness   Past Medical History:  She,  has a past medical history of Asthma, Diabetes mellitus without complication ([place]), and Hypertension.   Surgical History:   Past Surgical History:  Procedure Laterality Date   carpel tunnel     COLONOSCOPY WITH PROPOFOL N/A 12/08/2014   Procedure: COLONOSCOPY WITH PROPOFOL;  Surgeon: Lollie Sails,  MD;  Location: [facility] ENDOSCOPY;  Service: Endoscopy;  Laterality: N/A;   ESOPHAGOGASTRODUODENOSCOPY     TRACHEOSTOMY TUBE PLACEMENT N/A 06/19/2022   Procedure: TRACHEOSTOMY;  Surgeon: Beverly Gust, MD;  Location: [facility] ORS;  Service: ENT;  Laterality: N/A;     Social History:   reports that she quit smoking about 5 years ago. Her smoking use included cigarettes. She has never used smokeless tobacco. She reports that she does not drink alcohol and does not use drugs.   Family History:  Her family history includes Breast cancer (age of onset: 63) in her paternal aunt; Cancer in her paternal grandmother.   Allergies Allergies  Allergen Reactions   Abilify [Aripiprazole] Other (See Comments)    Syncope   Effexor [Venlafaxine] Other (See Comments)    Hair Loss   Wellbutrin [Bupropion] Other (See Comments)    Acne      Home Medications  Prior to Admission medications   Medication Sig Start Date End Date Taking? Authorizing Provider  albuterol (PROVENTIL HFA;VENTOLIN HFA) 108 (90 BASE) MCG/ACT inhaler Inhale into the lungs every 6 (six) hours as needed for wheezing or shortness of breath.   Yes [provider]  atorvastatin (LIPITOR) 40 MG tablet Take 40 mg by mouth at bedtime. 05/06/20  Yes [provider]  cetirizine (ZYRTEC) 10 MG tablet Take 10 mg by mouth daily.   Yes [provider]  clindamycin (CLEOCIN T) 1 % external solution Apply 1 Application topically 2 (two) times daily.   Yes [provider]  cyanocobalamin 1000 MCG tablet Take 1,000 mcg by mouth daily.   Yes [provider]  enalapril (VASOTEC) 20 MG tablet Take 20 mg by mouth daily.   Yes [provider]  famotidine (PEPCID) 20 MG tablet Take 20 mg by mouth 2 (two) times daily.   Yes [provider]  fluticasone (FLOVENT HFA) 110 MCG/ACT inhaler Inhale 2 puffs into the lungs 2 (two) times daily.   Yes [provider]  hydrochlorothiazide  (HYDRODIURIL) 25 MG tablet Take 25 mg by mouth daily.   Yes [provider]  hydrOXYzine (  ATARAX) 25 MG tablet Take 25 mg by mouth 3 (three) times daily as needed.   Yes [provider]  ipratropium (ATROVENT HFA) 17 MCG/ACT inhaler Inhale 2 puffs into the lungs every 4 (four) hours as needed for wheezing.   Yes [provider]  liraglutide (VICTOZA) 18 MG/3ML SOPN Inject 2.4 mg into the skin daily.   Yes [provider]  metFORMIN (GLUCOPHAGE-XR) 500 MG 24 hr tablet Take 1,000 mg by mouth 2 (two) times daily with a meal.   Yes [provider]  Scheduled Meds:  atorvastatin  40 mg Per Tube QHS   budesonide (PULMICORT) nebulizer solution  0.25 mg Nebulization BID   Chlorhexidine Gluconate Cloth  6 each Topical Q0600   enoxaparin (LOVENOX) injection  0.5 mg/kg Subcutaneous Q24H   feeding supplement (PROSource TF20)  60 mL Per Tube Daily   ferrous sulfate  325 mg Per Tube Q breakfast   fiber supplement (BANATROL TF)  60 mL Per Tube BID   free water  50 mL Per Tube Q4H   furosemide  40 mg Intravenous BID   insulin aspart  0-9 Units Subcutaneous Q4H   insulin aspart  4 Units Subcutaneous Q4H   insulin glargine-yfgn  25 Units Subcutaneous BID   ipratropium-albuterol  3 mL Nebulization Q6H   multivitamin with minerals  1 tablet Per Tube Daily   mouth rinse  15 mL Mouth Rinse Q2H   sodium chloride flush  10-40 mL Intracatheter Q12H   Continuous Infusions:  sodium chloride Stopped (06/26/22 1017)   feeding supplement (VITAL 1.5 CAL) 55 mL/hr at 06/27/22 0600   PRN Meds:.sodium chloride, acetaminophen **OR** acetaminophen, albuterol, dextrose, ondansetron **OR** ondansetron (ZOFRAN) IV, mouth rinse, oxyCODONE, polyethylene glycol, senna-docusate, sodium chloride flush   Critical care time: 35 minutes    Darel Hong, AGACNP-BC [facility] Prefer epic messenger for cross cover needs If after hours, please call  E-link

## 2022-06-28 DIAGNOSIS — J9601 Acute respiratory failure with hypoxia: Secondary | ICD-10-CM | POA: Diagnosis not present

## 2022-06-28 DIAGNOSIS — J9602 Acute respiratory failure with hypercapnia: Secondary | ICD-10-CM | POA: Diagnosis not present

## 2022-06-28 LAB — GLUCOSE, CAPILLARY
Glucose-Capillary: 203 mg/dL — ABNORMAL HIGH (ref 70–99)
Glucose-Capillary: 156 mg/dL — ABNORMAL HIGH (ref 70–99)
Glucose-Capillary: 228 mg/dL — ABNORMAL HIGH (ref 70–99)
Glucose-Capillary: 189 mg/dL — ABNORMAL HIGH (ref 70–99)
Glucose-Capillary: 189 mg/dL — ABNORMAL HIGH (ref 70–99)
Glucose-Capillary: 197 mg/dL — ABNORMAL HIGH (ref 70–99)
Glucose-Capillary: 167 mg/dL — ABNORMAL HIGH (ref 70–99)
Glucose-Capillary: 153 mg/dL — ABNORMAL HIGH (ref 70–99)

## 2022-06-28 LAB — CBC
HCT: 37.8 % (ref 36.0–46.0)
Hemoglobin: 10.3 g/dL — ABNORMAL LOW (ref 12.0–15.0)
MCH: 20.2 pg — ABNORMAL LOW (ref 26.0–34.0)
MCHC: 27.2 g/dL — ABNORMAL LOW (ref 30.0–36.0)
MCV: 74.3 fL — ABNORMAL LOW (ref 80.0–100.0)
Platelets: 387 10*3/uL (ref 150–400)
RBC: 5.09 MIL/uL (ref 3.87–5.11)
RDW: 26.9 % — ABNORMAL HIGH (ref 11.5–15.5)
WBC: 13.3 10*3/uL — ABNORMAL HIGH (ref 4.0–10.5)
nRBC: 0 % (ref 0.0–0.2)

## 2022-06-28 LAB — BASIC METABOLIC PANEL
Anion gap: 8 (ref 5–15)
BUN: 24 mg/dL — ABNORMAL HIGH (ref 6–20)
CO2: 32 mmol/L (ref 22–32)
Calcium: 9.4 mg/dL (ref 8.9–10.3)
Chloride: 102 mmol/L (ref 98–111)
Creatinine, Ser: 0.38 mg/dL — ABNORMAL LOW (ref 0.44–1.00)
GFR, Estimated: >60 mL/min (ref >60)
Glucose, Bld: 152 mg/dL — ABNORMAL HIGH (ref 70–99)
Potassium: 3.6 mmol/L (ref 3.5–5.1)
Sodium: 142 mmol/L (ref 135–145)

## 2022-06-28 LAB — NOROVIRUS GROUP 1 & 2 BY PCR, STOOL

## 2022-06-28 LAB — PHOSPHORUS: Phosphorus: 4.6 mg/dL (ref 2.5–4.6)

## 2022-06-28 LAB — MAGNESIUM: Magnesium: 2.2 mg/dL (ref 1.7–2.4)

## 2022-06-28 MED ORDER — SODIUM CHLORIDE 0.9% FLUSH
3.0000 mL | Freq: Two times a day (BID) | INTRAVENOUS | Status: DC
  Administered 2022-06-28 – 2022-07-05 (×15): 3 mL via INTRAVENOUS

## 2022-06-28 MED ORDER — FAMOTIDINE IN NACL 20-0.9 MG/50ML-% IV SOLN
20.0000 mg | Freq: Two times a day (BID) | INTRAVENOUS | Status: DC
  Administered 2022-06-28 – 2022-07-05 (×15): 20 mg via INTRAVENOUS
  Filled 2022-06-28 (×15): qty 50

## 2022-06-28 NOTE — Progress Notes (Addendum)
SLP Follow Up Note  Patient Details Name: Desiree Stone MRN: [id number] DOB: 06-27-1975   Cancelled treatment:       Reason Eval/Treat Not Completed: Patient not medically ready. Chart reviewed.   RN consulted and reported plan to downsize trach on Monday, 2/12. ST services will follow up Monday/Tuesday for PMSV re-eval post ENT downsizing trach as scheduled.    Randall Hiss Graduate Clinician [place], Speech Pathology  Randall Hiss 06/28/2022, 12:03 PM   The information in this patient note, response to treatment, and overall treatment plan developed has been reviewed and agreed upon after reviewing documentation. This session was performed under the supervision of this licensed clinician.   Orinda Kenner, [place], [place] [phone] 06/28/2022; 5:22pm

## 2022-06-28 NOTE — Progress Notes (Signed)
Physical Therapy Treatment Patient Details Name: Desiree Stone MRN: [id number] DOB: 1976/01/21 Today's Date: 06/28/2022   History of Present Illness Patient is a 47 year old female with acute hypoxic and hypercapnic respiratory failure, acute COPD exacerbation, new onset CHF, and suspected community-acquired pneumonia requiring intubation and mechanical ventilation. History of tobacco use    PT Comments    Patient is agreeable to PT and very motivated to get out of bed today. Supportive mother at the bedside. The patient was able to complete squat pivot transfer from bed to chair with +2 person assistance. 2 standing bouts performed from the recliner chair and patient able to achieve full standing position. Patient reports left leg weakness with left knee blocked to prevent buckling with weight bearing. Standing tolerance of 10-15 seconds. Patient is making progress with increased activity tolerance. Recommend to continue PT to maximize independence and decrease caregiver burden.    Recommendations for follow up therapy are one component of a multi-disciplinary discharge planning process, led by the attending physician.  Recommendations may be updated based on patient status, additional functional criteria and insurance authorization.  Follow Up Recommendations  Acute inpatient rehab (3hours/day)     Assistance Recommended at Discharge Intermittent Supervision/Assistance  Patient can return home with the following A little help with walking and/or transfers;A little help with bathing/dressing/bathroom;Help with stairs or ramp for entrance;Assist for transportation   Equipment Recommendations   (to be determined)    Recommendations for Other Services       Precautions / Restrictions Precautions Precautions: Fall Restrictions Weight Bearing Restrictions: No     Mobility  Bed Mobility Overal bed mobility: Needs Assistance Bed Mobility: Supine to Sit Rolling: Mod assist    Supine to sit: HOB elevated     General bed mobility comments: assistance for trunk support and for scooting fowards towards edge of bed in preparation for standing    Transfers Overall transfer level: Needs assistance Equipment used: Rolling walker (2 wheels), None Transfers: Sit to/from Stand, Bed to chair/wheelchair/BSC Sit to Stand: Mod assist, +2 physical assistance     Squat pivot transfers: Mod assist, +2 physical assistance     General transfer comment: performed sit to stand x 1 from bed with +2 person assistance. after rest break, patient performed squat pivot from bed to recliner chair with drop arm down with +2 person assistance and no device used. 2 standing bouts then performed from the recliner chair with left knee blocked to prevent buckling and lifting assistance required. cues for technique to faciliate independence with standing.    Ambulation/Gait               General Gait Details: limited standing tolerance for progression of walking   Stairs             Wheelchair Mobility    Modified Rankin (Stroke Patients Only)       Balance Overall balance assessment: Needs assistance Sitting-balance support: Feet supported Sitting balance-Leahy Scale: Poor     Standing balance support: Bilateral upper extremity supported Standing balance-Leahy Scale: Poor Standing balance comment: external support required for safety with left knee blocked to prevent buckling                            Cognition Arousal/Alertness: Awake/alert Behavior During Therapy: WFL for tasks assessed/performed Overall Cognitive Status: Difficult to assess  General Comments: patient is able to follow single step commands consistently        Exercises      General Comments        Pertinent Vitals/Pain Pain Assessment Pain Assessment: No/denies pain    Home Living                           Prior Function            PT Goals (current goals can now be found in the care plan section) Acute Rehab PT Goals Patient Stated Goal: to walk, go home PT Goal Formulation: With patient Time For Goal Achievement: 07/10/22 Potential to Achieve Goals: Good Progress towards PT goals: Progressing toward goals    Frequency    Min 2X/week      PT Plan Current plan remains appropriate    Co-evaluation              AM-PAC PT "6 Clicks" Mobility   Outcome Measure  Help needed turning from your back to your side while in a flat bed without using bedrails?: A Lot Help needed moving from lying on your back to sitting on the side of a flat bed without using bedrails?: A Lot Help needed moving to and from a bed to a chair (including a wheelchair)?: Total Help needed standing up from a chair using your arms (e.g., wheelchair or bedside chair)?: Total Help needed to walk in hospital room?: Total Help needed climbing 3-5 steps with a railing? : Total 6 Click Score: 8    End of Session Equipment Utilized During Treatment:  (trach mask oxygen) Activity Tolerance: Patient tolerated treatment well Patient left: in chair;with call bell/phone within reach Nurse Communication: Mobility status;Need for lift equipment PT Visit Diagnosis: Unsteadiness on feet (R26.81);Muscle weakness (generalized) (M62.81)     Time: 9977-4142 PT Time Calculation (min) (ACUTE ONLY): 29 min  Charges:  $Therapeutic Activity: 23-37 mins                     [name], PT, MPT    Percell Locus 06/28/2022, 1:32 PM

## 2022-06-28 NOTE — Consult Note (Signed)
PHARMACY CONSULT NOTE - ELECTROLYTES  Pharmacy Consult for Electrolyte Monitoring and Replacement   Recent Labs: Potassium (mmol/L)  Date Value  06/28/2022 3.6   Magnesium (mg/dL)  Date Value  06/28/2022 2.2   Calcium (mg/dL)  Date Value  06/28/2022 9.4   Albumin (g/dL)  Date Value  06/26/2022 2.4 (L)   Phosphorus (mg/dL)  Date Value  06/28/2022 4.6   Sodium (mmol/L)  Date Value  06/28/2022 142   Assessment 47 y.o. female presenting with AHRF. PMH significant for asthma, T2DM, HTN, OSA, tobacco use (32 pack-years), obesity. Pharmacy has been consulted to monitor and replace electrolytes while in CCU.  Diet/feeding: Glucerna 1.5'@65ml'$ /hr + FWF 47m q4h  Pertinent medications:  furosemide IV 40 mg BID  Goal of Therapy: Electrolytes WNL  Plan:  - no electrolyte replacement warranted for today - electrolytes have been stable for some time: move checks to every other day, next 06/30/22  Thank you for allowing pharmacy to be a part of this patient's care.  RDallie Piles PharmD, BCPS Clinical Pharmacist   06/28/2022 7:09 AM

## 2022-06-28 NOTE — Progress Notes (Addendum)
NAME:  Desiree Stone, MRN:  [id number], DOB:  Jul 26, 1975, LOS: 21 ADMISSION DATE:  06/06/2022, CONSULTATION DATE:  06/07/2022 REFERRING MD:  Dr. Tamala Julian, CHIEF COMPLAINT:  Shortness of breath   Brief Pt Description / Synopsis:  47 year old female admitted with acute hypoxic and hypercapnic respiratory failure in the setting of acute COPD exacerbation, new onset CHF, and suspected community-acquired pneumonia requiring intubation and mechanical ventilation.  History of Present Illness:  47 yo F presenting to [facility] ED from home with complaints of increasing shortness of breath. History provided per chart review, some items confirmed with her mother via phone interview. Patient reported on arrival to increased dyspnea over the past few months, worsening acutely in the last couple of days. She stated she could hardly walk down the hallway at the school and had to stop and rest. She denied outpatient oxygen use and has been unable to use her CPAP as she is unable to sleep in her bed. She instead has been sleeping in her chair for the past several weeks. She endorsed continued cigarette smoking. In addition she reported a productive cough with thick sputum and palpitations, as well as lower extremity edema. She denied difficulty swallowing, increased abdominal distention, or heart failure history. 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 Lab work significant for mild hyponatremia, hyperglycemia, leukocytosis, elevated but flat troponin and mildly elevated BNP. She was negative for COVID, RSV & influenza. Her respiratory status deteriorated and she was requiring 4 L [place]. CTa imaging was negative for PE, but did reveal cardiomegaly and an enlarged pulmonary artery suggestive of pulmonary hypertension. TRH was consulted for admission. The  patient's respiratory status did not improve while waiting for bed placement in the ED, with ABG demonstrating respiratory acidosis. The patient was placed on BIPAP. Follow up ABG's showed persistent respiratory acidosis even with setting adjustments. Eventually the decision was made to urgently intubate the patient and place her on mechanical ventilatory support. Medications given: lasix 40 mg, IV contrast, fentanyl, rocuronium & etomidate, lovenox, duoneb, solu medrol Initial Vitals: 98.4, 24, 98, 136/99 & 90% on 2 L [place] Significant labs: (Labs/ Imaging personally reviewed) I, Domingo Pulse [place], AGACNP-BC, personally viewed and interpreted this ECG. EKG Interpretation: Date: 06/06/22, EKG Time: 12:29, Rate: 104, Rhythm: ST, QRS Axis:  LAD, Intervals: normal, ST/T Wave abnormalities: non specific T wave abnormalities, Narrative Interpretation: ST with LAD and non specific T wave abnormalities Chemistry: Na+: 134, K+: 3.9, BUN/Cr.: 6/ 0.52, Serum CO2/ AG: 31/10, Cl: 93   CXR 06/06/22: no active cardiopulmonary disease. No evidence of pneumonia or pulmonary edema. Bronchitis/reactive airway disease, likely chronic. Borderline cardiomegaly CT soft tissue neck w contrast 06/06/22: no acute finding in the neck CT angio chest PE 06/06/22: no large central PE. The distal pulmonary arteries are not well evaluated due to the patient body habitus. No other evidence of acute cardiopulmonary pathology. Cardiomegaly with enlarged pulmonary artery suggesting pulmonary hypertension and refluxed contrast into the IVC, suggesting right heart dysfunction. Left adrenal nodule has been present since 2012 consistent with a benign adenoma.   PCCM consulted for admission due to worsening acute respiratory failure suspect multifocal secondary to acute new onset heart failure & suspected COPD in the setting of suspected pulmonary hypertension requiring emergent intubation and mechanical ventilatory support.  Please see  "significant  hospital events" section below for full detailed hospital course.  Pertinent  Medical History   Past Medical History:  Diagnosis Date   Asthma    Diabetes mellitus without complication ([place])    Hypertension     Micro Data:  1/16: SARS-CoV-2/influenza/RSV PCR>> negative 1/16: Group A strep PCR>> negative 1/17: MRSA PCR>> negative 1/17: HIV screen>> nonreactive 1/18: Blood culture x 2>> no growth  1/18: Tracheal aspirate>> normal respiratory flora  Antimicrobials:   Anti-infectives (From admission, onward)    Start     Dose/Rate Route Frequency Ordered Stop   06/21/22 1015  ceFEPIme (MAXIPIME) 2 g in sodium chloride 0.9 % 100 mL IVPB  Status:  Discontinued        2 g 200 mL/hr over 30 Minutes Intravenous Every 8 hours 06/21/22 0921 06/26/22 1007   06/13/22 2200  Ampicillin-Sulbactam (UNASYN) 3 g in sodium chloride 0.9 % 100 mL IVPB        3 g 200 mL/hr over 30 Minutes Intravenous Every 6 hours 06/13/22 1243 06/16/22 2150   06/12/22 0500  linezolid (ZYVOX) tablet 600 mg  Status:  Discontinued        600 mg Per Tube Every 12 hours 06/11/22 2301 06/13/22 1243   06/11/22 1400  piperacillin-tazobactam (ZOSYN) IVPB 3.375 g  Status:  Discontinued        3.375 g 12.5 mL/hr over 240 Minutes Intravenous Every 8 hours 06/11/22 1131 06/13/22 1243   06/11/22 1230  linezolid (ZYVOX) tablet 600 mg  Status:  Discontinued        600 mg Per Tube Every 12 hours 06/11/22 1131 06/11/22 2301   06/09/22 1100  cefTRIAXone (ROCEPHIN) 2 g in sodium chloride 0.9 % 100 mL IVPB  Status:  Discontinued        2 g 200 mL/hr over 30 Minutes Intravenous Every 24 hours 06/09/22 1006 06/11/22 1129   06/07/22 1100  azithromycin (ZITHROMAX) tablet 250 mg        250 mg Per Tube Daily 06/07/22 1006 06/11/22 0843       Significant Hospital Events: Including procedures, antibiotic start and stop dates in addition to other pertinent events   06/07/22: Admit to ICU with worsening acute respiratory  failure suspect secondary to acute new onset heart failure int he setting of suspected pulmonary hypertension & chronic COPD, OSA, asthma requiring emergent intubation and mechanical ventilatory support.  06/08/22- patient is s/p SBT with severe secretions, she did diurese >800cc today with lasix challenge, CXR with progressive pleural effusions. She has family coming in today. She's uisng OG and weve tranistioned to prednisone taper from solumedrol. Adding aldactone per tube. Iron workup today.  06/09/22-on minimal vent support, will attempt SBT.  Tracheal aspirate growing gram-positive cocci, ceftriaxone added 1/20 extubated and failed, re-intubated 1/21 severe COPD, remains on vent 1/22 severe COPD, remains on vent 1/23 severe upper airway swelling, ENT consulted 1/24 severe resp failure 1/26: Remains on vent, hemodynamically stable, tentative plan for Trach next Monday 1/29. 1/27: Remains on vent, no acute changes or events, awaiting Trach on Monday. D/C Solumedrol  1/28: Remains on vent, no acute changes.  BP soft, Lasix decreased to 40 mg daily. Low grade temperature overnight (T max 100), Obtain CT Chest 1/28: CT Chest>>Bilateral lower lobe consolidation, and right middle lobe       subsegmental atelectasis. No evidence of central endobronchial obstruction.       no evidence of pleural effusion. 2/01: CT Abd>>Portion of the transverse colon overlies the distal stomach.  Consider visualization of the transverse colon during percutaneous       gastrostomy tube placement. Persistent consolidation in both lower lobes.       Chronic left adrenal nodule. Minimal change since 2011 and likely a benign lesion.       Gallbladder distention without inflammatory changes. 2/02: Tolerated trach collar throughout the day  2/02: Speech therapy consulted for PMV trial, however pt did not tolerate PMV placement due to inability to redirect airflow superiorly for phonation and breathing.  Possibly will  require trach downsize when deemed appropriate 2/04: No acute events overnight.  Currently tolerating trach collar  2/06: No acute vents, tolerating TC during the day and resting on vent at night. Speech therapy/PT/OT following. ENT consulted pt pending downsize of trach on       02/12   Interim History / Subjective:  No acute events overnight.  Pt tolerated TCT and mechanical ventilation qhs.   Objective   Blood pressure 110/69, pulse 96, temperature 98.2 F (36.8 C), resp. rate (!) 25, height '4\' 11"'$  (1.499 m), weight 135.6 kg, last menstrual period 05/30/2022, SpO2 100 %.    Vent Mode: PSV FiO2 (%):  [28 %-40 %] 28 % PEEP:  [5 cmH20] 5 cmH20 Pressure Support:  [8 cmH20] 8 cmH20 Plateau Pressure:  [17 cmH20-18 cmH20] 17 cmH20   Intake/Output Summary (Last 24 hours) at 06/28/2022 0746 Last data filed at 06/28/2022 0434 Gross per 24 hour  Intake 1448.75 ml  Output 1100 ml  Net 348.75 ml   Filed Weights   06/25/22 0500 06/27/22 0500 06/28/22 0500  Weight: 135.6 kg 135 kg 135.6 kg    Examination: General: Acute on chronically obese female, laying in bed, NAD on mechanical ventilation in SBT  HENT: Atraumatic, normocephalic, neck supple, difficult to assess JVD due to body habitus Lungs: Diminished throughout, even, non labored, size 8 xlt midline dry/ intact   Cardiovascular: NSR, s1s2, no m/r/g, 2+ radial/2+ distal pulses, trace bilateral lower extremity edema  Abdomen: +BS x4, obese, soft, non distended, non tender  Extremities: No deformities, normal bulk and tone, moves all extremities  Neuro: Awake and following commands.  PERRLA GU: External catheter draining clear/yellow urine   Resolved Hospital Problem list   Acute Encephalopathy   Assessment & Plan:   #Acute hypoxic and hypercapnic respiratory failure in the setting of acute COPD exacerbation, new onset CHF, and pneumonia PMHx: OSA on CPAP, asthma, tobacco use - SBT/TCT as tolerated  - Maintain O2 sats 88 to 92% -  Follow intermittent Chest X-ray & ABG as needed - Continue VAP Bundle - Bronchodilators and nebulized steroids  - Diuresis as renal function and blood pressure permits  #Decompensated CHF  #Elevated Troponin secondary to demand ischemia PMHx: HTN, HLD Patient reports orthopnea, dyspnea on exertion & BLE edema. CTa suggestive of Right heart dysfunction and pulmonary HTN  Echocardiogram 06/07/22: LVEF 60 to 16%, normal diastolic parameters, RV function normal - Continuous cardiac monitoring - Maintain MAP >65 - Diuresis as renal function and blood pressure permits~pt currently net positive continue lasix bid   #HAP~treated   - Trend WBC and monitor fever curve  #Hypokalemia~improving   - Trend BMP - Replace electrolytes as indicated   #Diarrhea - GI panel results pending   #Type II diabetes mellitus~difficult to control   - SSI, scheduled novolog, and semglee  - Diabetes coordinator consulted appreciate input    Best Practice (right click and "Reselect all SmartList Selections" daily)   Diet/type: tubefeeds; speech therapy  consulted  DVT prophylaxis: Lovenox GI prophylaxis: Pepcid  Lines: PICC line, and is still needed Foley:  N/A Code Status:  full code Last date of multidisciplinary goals of care discussion [06/28/2022]  02/7: Updated pt regarding current plan of care and all questions answered  Labs   CBC: Recent Labs  Lab 06/24/22 0531 06/25/22 0353 06/26/22 0347 06/27/22 0604 06/28/22 0600  WBC 20.4* 16.6* 14.4* 14.2* 13.3*  HGB 9.5* 9.2* 9.1* 10.2* 10.3*  HCT 36.0 34.4* 34.0* 38.0 37.8  MCV 75.5* 74.1* 74.2* 74.2* 74.3*  PLT 305 322 333 392 176    Basic Metabolic Panel: Recent Labs  Lab 06/24/22 0531 06/24/22 1647 06/25/22 0353 06/26/22 0347 06/27/22 0604 06/28/22 0600  NA 140 141 141 142 142 142  K 3.9 3.9 3.4* 3.9 3.8 3.6  CL 104 103 104 104 99 102  CO2 '30 30 30 29 31 '$ 32  GLUCOSE 186* 143* 180* 244* 232* 152*  BUN 20 20 21* 20 20 24*   CREATININE 0.49 0.45 0.53 0.45 0.42* 0.38*  CALCIUM 8.6* 8.3* 8.3* 8.5* 9.1 9.4  MG 2.2  --  2.3 2.1 2.2 2.2  PHOS 2.5 3.5 3.4 3.1 3.7 4.6   GFR: Estimated Creatinine Clearance: 108.9 mL/min (A) (by C-G formula based on SCr of 0.38 mg/dL (L)). Recent Labs  Lab 06/23/22 0441 06/24/22 0531 06/25/22 0353 06/26/22 0347 06/27/22 0604 06/28/22 0600  PROCALCITON 0.37  --   --   --   --   --   WBC 22.3*   < > 16.6* 14.4* 14.2* 13.3*   < > = values in this interval not displayed.    Liver Function Tests: Recent Labs  Lab 06/23/22 0441 06/24/22 0531 06/24/22 1647 06/25/22 0353 06/26/22 0347  ALBUMIN 2.4* 2.6* 2.5* 2.4* 2.4*   No results for input(s): "LIPASE", "AMYLASE" in the last 168 hours. No results for input(s): "AMMONIA" in the last 168 hours.  ABG    Component Value Date/Time   PHART 7.42 06/07/2022 0828   PCO2ART 60 (H) 06/07/2022 0828   PO2ART 68 (L) 06/07/2022 0828   HCO3 38.9 (H) 06/07/2022 0828   O2SAT 95.7 06/07/2022 0828     Coagulation Profile: No results for input(s): "INR", "PROTIME" in the last 168 hours.  Cardiac Enzymes: No results for input(s): "CKTOTAL", "CKMB", "CKMBINDEX", "TROPONINI" in the last 168 hours.  HbA1C: Hgb A1c MFr Bld  Date/Time Value Ref Range Status  06/07/2022 04:54 AM 9.0 (H) 4.8 - 5.6 % Final    Comment:    (NOTE) Pre diabetes:          5.7%-6.4%  Diabetes:              >6.4%  Glycemic control for   <7.0% adults with diabetes     CBG: Recent Labs  Lab 06/27/22 1330 06/27/22 1616 06/27/22 2018 06/27/22 2351 06/28/22 0339  GLUCAP 233* 224* 228* 203* 156*    Review of Systems:   Unable to assess due to intubation and sedation  Past Medical History:  She,  has a past medical history of Asthma, Diabetes mellitus without complication ([place]), and Hypertension.   Surgical History:   Past Surgical History:  Procedure Laterality Date   carpel tunnel     COLONOSCOPY WITH PROPOFOL N/A 12/08/2014   Procedure:  COLONOSCOPY WITH PROPOFOL;  Surgeon: Lollie Sails, MD;  Location: [facility] ENDOSCOPY;  Service: Endoscopy;  Laterality: N/A;   ESOPHAGOGASTRODUODENOSCOPY     TRACHEOSTOMY TUBE PLACEMENT N/A 06/19/2022   Procedure:  TRACHEOSTOMY;  Surgeon: Beverly Gust, MD;  Location: [facility] ORS;  Service: ENT;  Laterality: N/A;     Social History:   reports that she quit smoking about 5 years ago. Her smoking use included cigarettes. She has never used smokeless tobacco. She reports that she does not drink alcohol and does not use drugs.   Family History:  Her family history includes Breast cancer (age of onset: 42) in her paternal aunt; Cancer in her paternal grandmother.   Allergies Allergies  Allergen Reactions   Abilify [Aripiprazole] Other (See Comments)    Syncope   Effexor [Venlafaxine] Other (See Comments)    Hair Loss   Wellbutrin [Bupropion] Other (See Comments)    Acne      Home Medications  Prior to Admission medications   Medication Sig Start Date End Date Taking? Authorizing Provider  albuterol (PROVENTIL HFA;VENTOLIN HFA) 108 (90 BASE) MCG/ACT inhaler Inhale into the lungs every 6 (six) hours as needed for wheezing or shortness of breath.   Yes [provider]  atorvastatin (LIPITOR) 40 MG tablet Take 40 mg by mouth at bedtime. 05/06/20  Yes [provider]  cetirizine (ZYRTEC) 10 MG tablet Take 10 mg by mouth daily.   Yes [provider]  clindamycin (CLEOCIN T) 1 % external solution Apply 1 Application topically 2 (two) times daily.   Yes [provider]  cyanocobalamin 1000 MCG tablet Take 1,000 mcg by mouth daily.   Yes [provider]  enalapril (VASOTEC) 20 MG tablet Take 20 mg by mouth daily.   Yes [provider]  famotidine (PEPCID) 20 MG tablet Take 20 mg by mouth 2 (two) times daily.   Yes [provider]  fluticasone (FLOVENT HFA) 110 MCG/ACT inhaler Inhale 2 puffs into the lungs 2 (two) times daily.   Yes  [provider]  hydrochlorothiazide (HYDRODIURIL) 25 MG tablet Take 25 mg by mouth daily.   Yes [provider]  hydrOXYzine (ATARAX) 25 MG tablet Take 25 mg by mouth 3 (three) times daily as needed.   Yes [provider]  ipratropium (ATROVENT HFA) 17 MCG/ACT inhaler Inhale 2 puffs into the lungs every 4 (four) hours as needed for wheezing.   Yes [provider]  liraglutide (VICTOZA) 18 MG/3ML SOPN Inject 2.4 mg into the skin daily.   Yes [provider]  metFORMIN (GLUCOPHAGE-XR) 500 MG 24 hr tablet Take 1,000 mg by mouth 2 (two) times daily with a meal.   Yes [provider]     Critical care time: 31 minutes     Donell Beers, [place] Pager [phone] (please enter 7 digits) PCCM Consult Pager [phone] (please enter 7 digits)

## 2022-06-29 DIAGNOSIS — J9601 Acute respiratory failure with hypoxia: Secondary | ICD-10-CM | POA: Diagnosis not present

## 2022-06-29 DIAGNOSIS — J9602 Acute respiratory failure with hypercapnia: Secondary | ICD-10-CM | POA: Diagnosis not present

## 2022-06-29 LAB — CBC WITH DIFFERENTIAL/PLATELET
Abs Immature Granulocytes: 0.06 10*3/uL (ref 0.00–0.07)
Basophils Absolute: 0 10*3/uL (ref 0.0–0.1)
Basophils Relative: 0 %
Eosinophils Absolute: 0.2 10*3/uL (ref 0.0–0.5)
Eosinophils Relative: 2 %
HCT: 39.8 % (ref 36.0–46.0)
Hemoglobin: 10.7 g/dL — ABNORMAL LOW (ref 12.0–15.0)
Immature Granulocytes: 1 %
Lymphocytes Relative: 19 %
Lymphs Abs: 2.2 10*3/uL (ref 0.7–4.0)
MCH: 20.1 pg — ABNORMAL LOW (ref 26.0–34.0)
MCHC: 26.9 g/dL — ABNORMAL LOW (ref 30.0–36.0)
MCV: 74.7 fL — ABNORMAL LOW (ref 80.0–100.0)
Monocytes Absolute: 0.8 10*3/uL (ref 0.1–1.0)
Monocytes Relative: 6 %
Neutro Abs: 8.8 10*3/uL — ABNORMAL HIGH (ref 1.7–7.7)
Neutrophils Relative %: 72 %
Platelets: 381 10*3/uL (ref 150–400)
RBC: 5.33 MIL/uL — ABNORMAL HIGH (ref 3.87–5.11)
RDW: 27.2 % — ABNORMAL HIGH (ref 11.5–15.5)
Smear Review: NORMAL
WBC: 12 10*3/uL — ABNORMAL HIGH (ref 4.0–10.5)
nRBC: 0 % (ref 0.0–0.2)

## 2022-06-29 LAB — BASIC METABOLIC PANEL
Anion gap: 10 (ref 5–15)
BUN: 31 mg/dL — ABNORMAL HIGH (ref 6–20)
CO2: 33 mmol/L — ABNORMAL HIGH (ref 22–32)
Calcium: 9.4 mg/dL (ref 8.9–10.3)
Chloride: 98 mmol/L (ref 98–111)
Creatinine, Ser: 0.53 mg/dL (ref 0.44–1.00)
GFR, Estimated: >60 mL/min (ref >60)
Glucose, Bld: 176 mg/dL — ABNORMAL HIGH (ref 70–99)
Potassium: 3.5 mmol/L (ref 3.5–5.1)
Sodium: 141 mmol/L (ref 135–145)

## 2022-06-29 LAB — GLUCOSE, CAPILLARY
Glucose-Capillary: 129 mg/dL — ABNORMAL HIGH (ref 70–99)
Glucose-Capillary: 139 mg/dL — ABNORMAL HIGH (ref 70–99)
Glucose-Capillary: 164 mg/dL — ABNORMAL HIGH (ref 70–99)
Glucose-Capillary: 170 mg/dL — ABNORMAL HIGH (ref 70–99)
Glucose-Capillary: 171 mg/dL — ABNORMAL HIGH (ref 70–99)
Glucose-Capillary: 172 mg/dL — ABNORMAL HIGH (ref 70–99)

## 2022-06-29 LAB — MAGNESIUM: Magnesium: 2.3 mg/dL (ref 1.7–2.4)

## 2022-06-29 LAB — PHOSPHORUS: Phosphorus: 5.6 mg/dL — ABNORMAL HIGH (ref 2.5–4.6)

## 2022-06-29 MED ORDER — SERTRALINE HCL 50 MG PO TABS
50.0000 mg | ORAL_TABLET | Freq: Every day | ORAL | Status: DC
  Administered 2022-06-29 – 2022-07-04 (×5): 50 mg
  Filled 2022-06-29 (×6): qty 1

## 2022-06-29 MED ORDER — PANCRELIPASE (LIP-PROT-AMYL) 10440-39150 UNITS PO TABS
20880.0000 [IU] | ORAL_TABLET | Freq: Once | ORAL | Status: AC
  Administered 2022-06-29: 20880 [IU]
  Filled 2022-06-29: qty 2

## 2022-06-29 MED ORDER — SODIUM BICARBONATE 650 MG PO TABS
650.0000 mg | ORAL_TABLET | Freq: Once | ORAL | Status: AC
  Administered 2022-06-29: 650 mg
  Filled 2022-06-29: qty 1

## 2022-06-29 MED ORDER — POTASSIUM CHLORIDE 10 MEQ/100ML IV SOLN
10.0000 meq | INTRAVENOUS | Status: AC
  Administered 2022-06-29 (×2): 10 meq via INTRAVENOUS
  Filled 2022-06-29 (×2): qty 100

## 2022-06-29 NOTE — Progress Notes (Addendum)
NAME:  Desiree Stone, MRN:  [id number], DOB:  03/11/1976, LOS: 4 ADMISSION DATE:  06/06/2022, CONSULTATION DATE:  06/07/2022 REFERRING MD:  Dr. Tamala Julian, CHIEF COMPLAINT:  Shortness of breath   Brief Pt Description / Synopsis:  47 year old female admitted with acute hypoxic and hypercapnic respiratory failure in the setting of acute COPD exacerbation, new onset CHF, and suspected community-acquired pneumonia requiring intubation and mechanical ventilation.  History of Present Illness:  47 yo F presenting to [facility] ED from home with complaints of increasing shortness of breath. History provided per chart review, some items confirmed with her mother via phone interview. Patient reported on arrival to increased dyspnea over the past few months, worsening acutely in the last couple of days. She stated she could hardly walk down the hallway at the school and had to stop and rest. She denied outpatient oxygen use and has been unable to use her CPAP as she is unable to sleep in her bed. She instead has been sleeping in her chair for the past several weeks. She endorsed continued cigarette smoking. In addition she reported a productive cough with thick sputum and palpitations, as well as lower extremity edema. She denied difficulty swallowing, increased abdominal distention, or heart failure history. 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 Lab work significant for mild hyponatremia, hyperglycemia, leukocytosis, elevated but flat troponin and mildly elevated BNP. She was negative for COVID, RSV & influenza. Her respiratory status deteriorated and she was requiring 4 L [place]. CTa imaging was negative for PE, but did reveal cardiomegaly and an enlarged pulmonary artery suggestive of pulmonary hypertension. TRH was consulted for admission. The  patient's respiratory status did not improve while waiting for bed placement in the ED, with ABG demonstrating respiratory acidosis. The patient was placed on BIPAP. Follow up ABG's showed persistent respiratory acidosis even with setting adjustments. Eventually the decision was made to urgently intubate the patient and place her on mechanical ventilatory support. Medications given: lasix 40 mg, IV contrast, fentanyl, rocuronium & etomidate, lovenox, duoneb, solu medrol Initial Vitals: 98.4, 24, 98, 136/99 & 90% on 2 L [place] Significant labs: (Labs/ Imaging personally reviewed) I, Domingo Pulse [place], AGACNP-BC, personally viewed and interpreted this ECG. EKG Interpretation: Date: 06/06/22, EKG Time: 12:29, Rate: 104, Rhythm: ST, QRS Axis:  LAD, Intervals: normal, ST/T Wave abnormalities: non specific T wave abnormalities, Narrative Interpretation: ST with LAD and non specific T wave abnormalities Chemistry: Na+: 134, K+: 3.9, BUN/Cr.: 6/ 0.52, Serum CO2/ AG: 31/10, Cl: 93   CXR 06/06/22: no active cardiopulmonary disease. No evidence of pneumonia or pulmonary edema. Bronchitis/reactive airway disease, likely chronic. Borderline cardiomegaly CT soft tissue neck w contrast 06/06/22: no acute finding in the neck CT angio chest PE 06/06/22: no large central PE. The distal pulmonary arteries are not well evaluated due to the patient body habitus. No other evidence of acute cardiopulmonary pathology. Cardiomegaly with enlarged pulmonary artery suggesting pulmonary hypertension and refluxed contrast into the IVC, suggesting right heart dysfunction. Left adrenal nodule has been present since 2012 consistent with a benign adenoma.   PCCM consulted for admission due to worsening acute respiratory failure suspect multifocal secondary to acute new onset heart failure & suspected COPD in the setting of suspected pulmonary hypertension requiring emergent intubation and mechanical ventilatory support.  Please see  "significant  hospital events" section below for full detailed hospital course.  Pertinent  Medical History   Past Medical History:  Diagnosis Date   Asthma    Diabetes mellitus without complication ([place])    Hypertension     Micro Data:  1/16: SARS-CoV-2/influenza/RSV PCR>> negative 1/16: Group A strep PCR>> negative 1/17: MRSA PCR>> negative 1/17: HIV screen>> nonreactive 1/18: Blood culture x 2>> no growth  1/18: Tracheal aspirate>> normal respiratory flora  Antimicrobials:   Anti-infectives (From admission, onward)    Start     Dose/Rate Route Frequency Ordered Stop   06/21/22 1015  ceFEPIme (MAXIPIME) 2 g in sodium chloride 0.9 % 100 mL IVPB  Status:  Discontinued        2 g 200 mL/hr over 30 Minutes Intravenous Every 8 hours 06/21/22 0921 06/26/22 1007   06/13/22 2200  Ampicillin-Sulbactam (UNASYN) 3 g in sodium chloride 0.9 % 100 mL IVPB        3 g 200 mL/hr over 30 Minutes Intravenous Every 6 hours 06/13/22 1243 06/16/22 2150   06/12/22 0500  linezolid (ZYVOX) tablet 600 mg  Status:  Discontinued        600 mg Per Tube Every 12 hours 06/11/22 2301 06/13/22 1243   06/11/22 1400  piperacillin-tazobactam (ZOSYN) IVPB 3.375 g  Status:  Discontinued        3.375 g 12.5 mL/hr over 240 Minutes Intravenous Every 8 hours 06/11/22 1131 06/13/22 1243   06/11/22 1230  linezolid (ZYVOX) tablet 600 mg  Status:  Discontinued        600 mg Per Tube Every 12 hours 06/11/22 1131 06/11/22 2301   06/09/22 1100  cefTRIAXone (ROCEPHIN) 2 g in sodium chloride 0.9 % 100 mL IVPB  Status:  Discontinued        2 g 200 mL/hr over 30 Minutes Intravenous Every 24 hours 06/09/22 1006 06/11/22 1129   06/07/22 1100  azithromycin (ZITHROMAX) tablet 250 mg        250 mg Per Tube Daily 06/07/22 1006 06/11/22 0843       Significant Hospital Events: Including procedures, antibiotic start and stop dates in addition to other pertinent events   06/07/22: Admit to ICU with worsening acute respiratory  failure suspect secondary to acute new onset heart failure int he setting of suspected pulmonary hypertension & chronic COPD, OSA, asthma requiring emergent intubation and mechanical ventilatory support.  06/08/22- patient is s/p SBT with severe secretions, she did diurese >800cc today with lasix challenge, CXR with progressive pleural effusions. She has family coming in today. She's uisng OG and weve tranistioned to prednisone taper from solumedrol. Adding aldactone per tube. Iron workup today.  06/09/22-on minimal vent support, will attempt SBT.  Tracheal aspirate growing gram-positive cocci, ceftriaxone added 1/20 extubated and failed, re-intubated 1/21 severe COPD, remains on vent 1/22 severe COPD, remains on vent 1/23 severe upper airway swelling, ENT consulted 1/24 severe resp failure 1/26: Remains on vent, hemodynamically stable, tentative plan for Trach next Monday 1/29. 1/27: Remains on vent, no acute changes or events, awaiting Trach on Monday. D/C Solumedrol  1/28: Remains on vent, no acute changes.  BP soft, Lasix decreased to 40 mg daily. Low grade temperature overnight (T max 100), Obtain CT Chest 1/28: CT Chest>>Bilateral lower lobe consolidation, and right middle lobe       subsegmental atelectasis. No evidence of central endobronchial obstruction.       no evidence of pleural effusion. 2/01: CT Abd>>Portion of the transverse colon overlies the distal stomach.  Consider visualization of the transverse colon during percutaneous       gastrostomy tube placement. Persistent consolidation in both lower lobes.       Chronic left adrenal nodule. Minimal change since 2011 and likely a benign lesion.       Gallbladder distention without inflammatory changes. 2/02: Tolerated trach collar throughout the day  2/02: Speech therapy consulted for PMV trial, however pt did not tolerate PMV placement due to inability to redirect airflow superiorly for phonation and breathing.  Possibly will  require trach downsize when deemed appropriate 2/04: No acute events overnight.  Currently tolerating trach collar  2/06: No acute vents, tolerating TC during the day and resting on vent at night. Speech therapy/PT/OT following. ENT consulted pt pending downsize of trach on       02/12   Interim History / Subjective:  No acute events overnight.  Pt tolerated TCT overnight   Objective   Blood pressure 112/79, pulse 91, temperature 98.3 F (36.8 C), resp. rate (!) 22, height '4\' 11"'$  (1.499 m), weight 136 kg, last menstrual period 05/30/2022, SpO2 91 %.    FiO2 (%):  [28 %] 28 %   Intake/Output Summary (Last 24 hours) at 06/29/2022 0805 Last data filed at 06/29/2022 0414 Gross per 24 hour  Intake 1610.1 ml  Output 700 ml  Net 910.1 ml   Filed Weights   06/27/22 0500 06/28/22 0500 06/29/22 0431  Weight: 135 kg 135.6 kg 136 kg    Examination: General: Acute on chronically obese female, laying in bed, NAD on mechanical ventilation in SBT  HENT: Atraumatic, normocephalic, neck supple, difficult to assess JVD due to body habitus Lungs: Diminished throughout, even, non labored, size 8 xlt midline dry/ intact   Cardiovascular: NSR, s1s2, no m/r/g, 2+ radial/2+ distal pulses, trace bilateral lower extremity edema  Abdomen: +BS x4, obese, soft, non distended, non tender  Extremities: No deformities, normal bulk and tone, moves all extremities  Neuro: Awake and following commands.  PERRLA GU: External catheter draining clear/yellow urine   Resolved Hospital Problem list   Acute Encephalopathy   Assessment & Plan:   #Acute hypoxic and hypercapnic respiratory failure in the setting of acute COPD exacerbation, new onset CHF, and pneumonia PMHx: OSA on CPAP, asthma, tobacco use - SBT/TCT as tolerated  - Maintain O2 sats 88 to 92% - Follow intermittent Chest X-ray & ABG as needed - Continue VAP Bundle - Bronchodilators and nebulized steroids  - Diuresis as renal function and blood pressure  permits  #Decompensated CHF  #Elevated Troponin secondary to demand ischemia PMHx: HTN, HLD Patient reports orthopnea, dyspnea on exertion & BLE edema. CTa suggestive of Right heart dysfunction and pulmonary HTN  Echocardiogram 06/07/22: LVEF 60 to 00%, normal diastolic parameters, RV function normal - Continuous cardiac monitoring - Maintain MAP >65 - Diuresis as renal function and blood pressure permits~pt currently net positive continue lasix bid   #HAP~treated   - Trend WBC and monitor fever curve  #Hypokalemia~improving   - Trend BMP - Replace electrolytes as indicated   #Diarrhea - GI panel results pending   #Type II diabetes mellitus~difficult to control   - SSI, scheduled novolog, and semglee  - Diabetes coordinator consulted appreciate input    Best Practice (right click and "Reselect all SmartList Selections" daily)   Diet/type: tubefeeds; speech therapy consulted  DVT prophylaxis: Lovenox GI prophylaxis: Pepcid  Lines: PICC line, and is still needed Foley:  N/A Code Status:  full code Last date of multidisciplinary  goals of care discussion [06/29/2022]  02/8: Updated pt regarding current plan of care and all questions answered  Labs   CBC: Recent Labs  Lab 06/25/22 0353 06/26/22 0347 06/27/22 0604 06/28/22 0600 06/29/22 0436  WBC 16.6* 14.4* 14.2* 13.3* 12.0*  NEUTROABS  --   --   --   --  8.8*  HGB 9.2* 9.1* 10.2* 10.3* 10.7*  HCT 34.4* 34.0* 38.0 37.8 39.8  MCV 74.1* 74.2* 74.2* 74.3* 74.7*  PLT 322 333 392 387 284    Basic Metabolic Panel: Recent Labs  Lab 06/25/22 0353 06/26/22 0347 06/27/22 0604 06/28/22 0600 06/29/22 0436  NA 141 142 142 142 141  K 3.4* 3.9 3.8 3.6 3.5  CL 104 104 99 102 98  CO2 '30 29 31 '$ 32 33*  GLUCOSE 180* 244* 232* 152* 176*  BUN 21* 20 20 24* 31*  CREATININE 0.53 0.45 0.42* 0.38* 0.53  CALCIUM 8.3* 8.5* 9.1 9.4 9.4  MG 2.3 2.1 2.2 2.2 2.3  PHOS 3.4 3.1 3.7 4.6 5.6*   GFR: Estimated Creatinine Clearance: 109  mL/min (by C-G formula based on SCr of 0.53 mg/dL). Recent Labs  Lab 06/23/22 0441 06/24/22 0531 06/26/22 0347 06/27/22 0604 06/28/22 0600 06/29/22 0436  PROCALCITON 0.37  --   --   --   --   --   WBC 22.3*   < > 14.4* 14.2* 13.3* 12.0*   < > = values in this interval not displayed.    Liver Function Tests: Recent Labs  Lab 06/23/22 0441 06/24/22 0531 06/24/22 1647 06/25/22 0353 06/26/22 0347  ALBUMIN 2.4* 2.6* 2.5* 2.4* 2.4*   No results for input(s): "LIPASE", "AMYLASE" in the last 168 hours. No results for input(s): "AMMONIA" in the last 168 hours.  ABG    Component Value Date/Time   PHART 7.42 06/07/2022 0828   PCO2ART 60 (H) 06/07/2022 0828   PO2ART 68 (L) 06/07/2022 0828   HCO3 38.9 (H) 06/07/2022 0828   O2SAT 95.7 06/07/2022 0828     Coagulation Profile: No results for input(s): "INR", "PROTIME" in the last 168 hours.  Cardiac Enzymes: No results for input(s): "CKTOTAL", "CKMB", "CKMBINDEX", "TROPONINI" in the last 168 hours.  HbA1C: Hgb A1c MFr Bld  Date/Time Value Ref Range Status  06/07/2022 04:54 AM 9.0 (H) 4.8 - 5.6 % Final    Comment:    (NOTE) Pre diabetes:          5.7%-6.4%  Diabetes:              >6.4%  Glycemic control for   <7.0% adults with diabetes     CBG: Recent Labs  Lab 06/28/22 1558 06/28/22 1923 06/28/22 2306 06/29/22 0345 06/29/22 0737  GLUCAP 197* 167* 153* 164* 170*    Review of Systems:   Unable to assess due to intubation and sedation  Past Medical History:  She,  has a past medical history of Asthma, Diabetes mellitus without complication ([place]), and Hypertension.   Surgical History:   Past Surgical History:  Procedure Laterality Date   carpel tunnel     COLONOSCOPY WITH PROPOFOL N/A 12/08/2014   Procedure: COLONOSCOPY WITH PROPOFOL;  Surgeon: Lollie Sails, MD;  Location: [facility] ENDOSCOPY;  Service: Endoscopy;  Laterality: N/A;   ESOPHAGOGASTRODUODENOSCOPY     TRACHEOSTOMY TUBE PLACEMENT N/A 06/19/2022    Procedure: TRACHEOSTOMY;  Surgeon: Beverly Gust, MD;  Location: [facility] ORS;  Service: ENT;  Laterality: N/A;     Social History:   reports that she quit smoking  about 5 years ago. Her smoking use included cigarettes. She has never used smokeless tobacco. She reports that she does not drink alcohol and does not use drugs.   Family History:  Her family history includes Breast cancer (age of onset: 13) in her paternal aunt; Cancer in her paternal grandmother.   Allergies Allergies  Allergen Reactions   Abilify [Aripiprazole] Other (See Comments)    Syncope   Effexor [Venlafaxine] Other (See Comments)    Hair Loss   Wellbutrin [Bupropion] Other (See Comments)    Acne      Home Medications  Prior to Admission medications   Medication Sig Start Date End Date Taking? Authorizing Provider  albuterol (PROVENTIL HFA;VENTOLIN HFA) 108 (90 BASE) MCG/ACT inhaler Inhale into the lungs every 6 (six) hours as needed for wheezing or shortness of breath.   Yes [provider]  atorvastatin (LIPITOR) 40 MG tablet Take 40 mg by mouth at bedtime. 05/06/20  Yes [provider]  cetirizine (ZYRTEC) 10 MG tablet Take 10 mg by mouth daily.   Yes [provider]  clindamycin (CLEOCIN T) 1 % external solution Apply 1 Application topically 2 (two) times daily.   Yes [provider]  cyanocobalamin 1000 MCG tablet Take 1,000 mcg by mouth daily.   Yes [provider]  enalapril (VASOTEC) 20 MG tablet Take 20 mg by mouth daily.   Yes [provider]  famotidine (PEPCID) 20 MG tablet Take 20 mg by mouth 2 (two) times daily.   Yes [provider]  fluticasone (FLOVENT HFA) 110 MCG/ACT inhaler Inhale 2 puffs into the lungs 2 (two) times daily.   Yes [provider]  hydrochlorothiazide (HYDRODIURIL) 25 MG tablet Take 25 mg by mouth daily.   Yes [provider]  hydrOXYzine (ATARAX) 25 MG tablet Take 25 mg by mouth 3 (three) times  daily as needed.   Yes [provider]  ipratropium (ATROVENT HFA) 17 MCG/ACT inhaler Inhale 2 puffs into the lungs every 4 (four) hours as needed for wheezing.   Yes [provider]  liraglutide (VICTOZA) 18 MG/3ML SOPN Inject 2.4 mg into the skin daily.   Yes [provider]  metFORMIN (GLUCOPHAGE-XR) 500 MG 24 hr tablet Take 1,000 mg by mouth 2 (two) times daily with a meal.   Yes [provider]     Critical care time: 30 minutes     Donell Beers, [place] Pager [phone] (please enter 7 digits) [place] Pager [phone] (please enter 7 digits)       ICU ATTENDING ATTESTATION:  Patient seen and examined and relevant ancillary tests reviewed.   I agree with the assessment and plan of care as outlined by Donell Beers NP.    BP 130/89   Pulse 94   Temp 98.2 F (36.8 C) (Oral)   Resp (!) 22   Ht '4\' 11"'$  (1.499 m)   Wt 136 kg   LMP 05/30/2022   SpO2 97%   BMI 60.56 kg/m   CBC    Component Value Date/Time   WBC 12.0 (H) 06/29/2022 0436   RBC 5.33 (H) 06/29/2022 0436   HGB 10.7 (L) 06/29/2022 0436   HGB 12.6 05/13/2020 1534   HCT 39.8 06/29/2022 0436   HCT 41.7 05/13/2020 1534   PLT 381 06/29/2022 0436   PLT 297 05/13/2020 1534   MCV 74.7 (L) 06/29/2022 0436   MCV 71 (L) 05/13/2020 1534   MCH 20.1 (L) 06/29/2022 0436   MCHC  26.9 (L) 06/29/2022 0436   RDW 27.2 (H) 06/29/2022 0436   RDW 18.1 (H) 05/13/2020 1534   LYMPHSABS 2.2 06/29/2022 0436   LYMPHSABS 3.8 (H) 05/13/2020 1534   MONOABS 0.8 06/29/2022 0436   EOSABS 0.2 06/29/2022 0436   EOSABS 0.1 05/13/2020 1534   BASOSABS 0.0 06/29/2022 0436   BASOSABS 0.1 05/13/2020 1534        Latest Ref Rng & Units 06/29/2022    4:36 AM 06/28/2022    6:00 AM 06/27/2022    6:04 AM  BMP  Glucose 70 - 99 mg/dL 176  152  232   BUN 6 - 20 mg/dL '31  24  20   '$ Creatinine 0.44 - 1.00 mg/dL 0.53  0.38  0.42   Sodium 135 - 145 mmol/L 141  142  142   Potassium 3.5  - 5.1 mmol/L 3.5  3.6  3.8   Chloride 98 - 111 mmol/L 98  102  99   CO2 22 - 32 mmol/L 33  32  31   Calcium 8.9 - 10.3 mg/dL 9.4  9.4  9.1        Airanna Partin Patricia Pesa, M.D.  [facility]  Medical Director [place] Director [place] Department

## 2022-06-29 NOTE — Progress Notes (Signed)
Occupational Therapy Treatment Patient Details Name: Desiree Stone MRN: [id number] DOB: January 28, 1976 Today's Date: 06/29/2022   History of present illness Patient is a 47 year old female with acute hypoxic and hypercapnic respiratory failure, acute COPD exacerbation, new onset CHF, and suspected community-acquired pneumonia requiring intubation and mechanical ventilation. History of tobacco use   OT comments  Upon entering the room, pt supine in bed with no c/o pain  and motivated for therapy. Pt rolling with mod A L <> R with use of bed rails to placed hoyer sling. Hoyer utilized to [employer] chair for safe attempts at standing secondary to pt being 4'11. Pt washes face while seated with set up A. Pt stands x 3 attempts with mod A of 2 from chair for 20- 30 seconds each time. When standing she is able to stand with assist of 1 and remain upright with cuing for positioning while therapist assists with hygiene, changing of chux pad, and application of barrier cream. Pt remains in recliner chair with hoyer sling under her for transfer back to bed with nursing when ready to do so. Pt's mother remains in room throughout session. All needs within reach. Pt continues to be an excellent candidate for intensive inpatient rehab to address functional goals.   Recommendations for follow up therapy are one component of a multi-disciplinary discharge planning process, led by the attending physician.  Recommendations may be updated based on patient status, additional functional criteria and insurance authorization.    Follow Up Recommendations  Acute inpatient rehab (3hours/day)     Assistance Recommended at Discharge Frequent or constant Supervision/Assistance  Patient can return home with the following  Two people to help with walking and/or transfers;A lot of help with bathing/dressing/bathroom;Assist for transportation;Assistance with cooking/housework;Help with stairs or ramp for entrance   Equipment  Recommendations  Other (comment) (defer to next venue of care)       Precautions / Restrictions Precautions Precautions: Fall Restrictions Weight Bearing Restrictions: No       Mobility Bed Mobility Overal bed mobility: Needs Assistance Bed Mobility: Rolling Rolling: Min assist, Mod assist         General bed mobility comments: Mod A for rolling to right and Min A for rolling to left. verbal cues for technique. due to short stature and safety concerns with standing from the bed, overhead lift used to assist patient to the chair for standing efforts.    Transfers Overall transfer level: Needs assistance Equipment used: Rolling walker (2 wheels) Transfers: Sit to/from Stand Sit to Stand: Mod assist, +2 physical assistance           General transfer comment: overhead lift used to get patient from bed to chair for safely being able to focus on transfer training and standing activity. 3 bouts of standing performed from recliner with Mod A +2. cues for anterior weight shifting to faciliate independence with standing as well as cues for hand placement. short rest breaks between bouts of standing. cues for technique to increase independence with posteriorly scooting in chair using weight shifting for comfort Transfer via Lift Equipment: [place] Overall balance assessment: Needs assistance Sitting-balance support: Feet supported Sitting balance-Leahy Scale: Good     Standing balance support: Bilateral upper extremity supported Standing balance-Leahy Scale: Poor Standing balance comment: external support of one person required for safety with left knee buckling noted occasionally with weight bearing. standing tolerance of 20-30 seconds  ADL either performed or assessed with clinical judgement   ADL Overall ADL's : Needs assistance/impaired                                       General ADL Comments: Pt stands with  assistance of 1 and then second person assists with hygiene and applying barrier cream to buttocks    Extremity/Trunk Assessment Upper Extremity Assessment Upper Extremity Assessment: Generalized weakness   Lower Extremity Assessment Lower Extremity Assessment: Generalized weakness        Vision Patient Visual Report: No change from baseline            Cognition Arousal/Alertness: Awake/alert Behavior During Therapy: WFL for tasks assessed/performed Overall Cognitive Status: Difficult to assess                                 General Comments: patient is able to follow single step commands consistently              General Comments patient complains of left leg weakness with knee buckling intermittently with weight bearing. knee extension 4/5, plantarflexion 5/5, dorsiflexion 4+/5. encouraged LAQ while sitting in chair for strengthening. no significant change in vitals noted with activity    Pertinent Vitals/ Pain       Pain Assessment Pain Assessment: No/denies pain         Frequency  Min 3X/week        Progress Toward Goals  OT Goals(current goals can now be found in the care plan section)  Progress towards OT goals: Progressing toward goals  Acute Rehab OT Goals Patient Stated Goal: to get better OT Goal Formulation: With patient Time For Goal Achievement: 07/10/22 Potential to Achieve Goals: Good  Plan Discharge plan remains appropriate;Frequency remains appropriate    Co-evaluation    PT/OT/SLP Co-Evaluation/Treatment: Yes Reason for Co-Treatment: Complexity of the patient's impairments (multi-system involvement);For patient/therapist safety;To address functional/ADL transfers PT goals addressed during session: Mobility/safety with mobility OT goals addressed during session: ADL's and self-care;Proper use of Adaptive equipment and DME      AM-PAC OT "6 Clicks" Daily Activity     Outcome Measure   Help from another person eating  meals?: None Help from another person taking care of personal grooming?: A Little Help from another person toileting, which includes using toliet, bedpan, or urinal?: Total Help from another person bathing (including washing, rinsing, drying)?: A Lot Help from another person to put on and taking off regular upper body clothing?: A Little Help from another person to put on and taking off regular lower body clothing?: Total 6 Click Score: 14    End of Session Equipment Utilized During Treatment: Rolling walker (2 wheels)  OT Visit Diagnosis: Unsteadiness on feet (R26.81);Repeated falls (R29.6);Muscle weakness (generalized) (M62.81)   Activity Tolerance Patient tolerated treatment well   Patient Left in bed;with call bell/phone within reach;with bed alarm set   Nurse Communication Mobility status        Time: 1100-1136 OT Time Calculation (min): 36 min  Charges: OT General Charges $OT Visit: 1 Visit OT Treatments $Self Care/Home Management : 8-22 mins  Darleen Crocker, MS, OTR/L , CBIS ascom [phone]  06/29/22, 2:26 PM

## 2022-06-29 NOTE — Consult Note (Signed)
PHARMACY CONSULT NOTE - ELECTROLYTES  Pharmacy Consult for Electrolyte Monitoring and Replacement   Recent Labs: Potassium (mmol/L)  Date Value  06/29/2022 3.5   Magnesium (mg/dL)  Date Value  06/29/2022 2.3   Calcium (mg/dL)  Date Value  06/29/2022 9.4   Albumin (g/dL)  Date Value  06/26/2022 2.4 (L)   Phosphorus (mg/dL)  Date Value  06/29/2022 5.6 (H)   Sodium (mmol/L)  Date Value  06/29/2022 141   Assessment 47 y.o. female presenting with AHRF. PMH significant for asthma, T2DM, HTN, OSA, tobacco use (32 pack-years), obesity. Pharmacy has been consulted to monitor and replace electrolytes while in CCU.  Diet/feeding: Glucerna 1.5'@65ml'$ /hr + FWF 27m q4h  Pertinent medications:  furosemide IV 40 mg BID  Goal of Therapy: Electrolytes WNL  Plan:  - 10 mEq IV KCl x 2 - recheck electrolytes in am  Thank you for allowing pharmacy to be a part of this patient's care.  RDallie Piles PharmD, BCPS Clinical Pharmacist   06/29/2022 7:02 AM

## 2022-06-29 NOTE — TOC Progression Note (Signed)
Transition of Care [facility]) - Progression Note    Patient Details  Name: GINNI EICHLER MRN: [id number] Date of Birth: 18-Oct-1975  [facility] CM/SW Contact  Shelbie Hutching, RN Phone Number: 06/29/2022, 4:19 PM  Clinical Narrative:    Patient has been tolerating trach collar for over 24 hours.  Reached out to CIR to re evaluate.  Shann Medal with CIR will review chart.     Expected Discharge Plan: IP Rehab Facility Barriers to Discharge: Continued Medical Work up  Expected Discharge Plan and Services   Discharge Planning Services: CM Consult Post Acute Care Choice: IP Rehab Living arrangements for the past 2 months: Single Family Home                                       Social Determinants of Health (SDOH) Interventions SDOH Screenings   Food Insecurity: No Food Insecurity (06/17/2022)  Housing: Low Risk  (06/17/2022)  Transportation Needs: No Transportation Needs (06/17/2022)  Utilities: Not At Risk (06/17/2022)  Physical Activity: Sufficiently Active (05/23/2017)  Social Connections: Moderately Isolated (05/23/2017)  Stress: No Stress Concern Present (05/23/2017)  Tobacco Use: Medium Risk (06/20/2022)    Readmission Risk Interventions     No data to display

## 2022-06-29 NOTE — Progress Notes (Signed)
Physical Therapy Treatment Patient Details Name: Desiree Stone MRN: [id number] DOB: November 20, 1975 Today's Date: 06/29/2022   History of Present Illness Patient is a 47 year old female with acute hypoxic and hypercapnic respiratory failure, acute COPD exacerbation, new onset CHF, and suspected community-acquired pneumonia requiring intubation and mechanical ventilation. History of tobacco use    PT Comments    Patient is motivated and eager to participate. Used the overhead lift to assist patient to the chair safely for transfer training due to short stature. Patient was able to stand x 3 bouts from the recliner chair with Mod A +2 person. Cues for anterior weight shifting and hand placement to facilitate independence. Standing tolerance of 20-30 second bouts. Occasional left knee buckling with weight bearing and standing fully upright. The patient is making progress with functional mobility. Recommend to continue PT to maximize independence and decrease caregiver burden.    Recommendations for follow up therapy are one component of a multi-disciplinary discharge planning process, led by the attending physician.  Recommendations may be updated based on patient status, additional functional criteria and insurance authorization.  Follow Up Recommendations  Acute inpatient rehab (3hours/day)     Assistance Recommended at Discharge Intermittent Supervision/Assistance  Patient can return home with the following A little help with walking and/or transfers;A little help with bathing/dressing/bathroom;Help with stairs or ramp for entrance;Assist for transportation   Equipment Recommendations   (to be determined)    Recommendations for Other Services       Precautions / Restrictions Precautions Precautions: Fall Restrictions Weight Bearing Restrictions: No     Mobility  Bed Mobility Overal bed mobility: Needs Assistance Bed Mobility: Rolling Rolling: Min assist, Mod assist          General bed mobility comments: Mod A for rolling to right and Min A for rolling to left. verbal cues for technique. due to short stature and safety concerns with standing from the bed, overhead lift used to assist patient to the chair for standing efforts.    Transfers Overall transfer level: Needs assistance Equipment used: Rolling walker (2 wheels) Transfers: Sit to/from Stand Sit to Stand: Mod assist, +2 physical assistance           General transfer comment: overhead lift used to get patient from bed to chair for safely being able to focus on transfer training and standing activity. 3 bouts of standing performed from recliner with Mod A +2. cues for anterior weight shifting to faciliate independence with standing as well as cues for hand placement. short rest breaks between bouts of standing. cues for technique to increase independence with posteriorly scooting in chair using weight shifting for comfort Transfer via Lift Equipment: [place]  Ambulation/Gait               General Gait Details: limited standing tolerance for progression of walking   Stairs             Wheelchair Mobility    Modified Rankin (Stroke Patients Only)       Balance Overall balance assessment: Needs assistance Sitting-balance support: Feet supported Sitting balance-Leahy Scale: Good     Standing balance support: Bilateral upper extremity supported Standing balance-Leahy Scale: Poor Standing balance comment: external support of one person required for safety with left knee buckling noted occasionally with weight bearing. standing tolerance of 20-30 seconds                            Cognition  Arousal/Alertness: Awake/alert Behavior During Therapy: WFL for tasks assessed/performed Overall Cognitive Status: Difficult to assess                                 General Comments: patient is able to follow single step commands consistently        Exercises       General Comments General comments (skin integrity, edema, etc.): patient complains of left leg weakness with knee buckling intermittently with weight bearing. knee extension 4/5, plantarflexion 5/5, dorsiflexion 4+/5. encouraged LAQ while sitting in chair for strengthening. no significant change in vitals noted with activity      Pertinent Vitals/Pain Pain Assessment Pain Assessment: No/denies pain    Home Living                          Prior Function            PT Goals (current goals can now be found in the care plan section) Acute Rehab PT Goals Patient Stated Goal: to walk, go home PT Goal Formulation: With patient Time For Goal Achievement: 07/10/22 Potential to Achieve Goals: Good Progress towards PT goals: Progressing toward goals    Frequency    Min 2X/week      PT Plan Current plan remains appropriate    Co-evaluation PT/OT/SLP Co-Evaluation/Treatment: Yes Reason for Co-Treatment: Complexity of the patient's impairments (multi-system involvement);For patient/therapist safety;To address functional/ADL transfers PT goals addressed during session: Mobility/safety with mobility        AM-PAC PT "6 Clicks" Mobility   Outcome Measure  Help needed turning from your back to your side while in a flat bed without using bedrails?: A Lot Help needed moving from lying on your back to sitting on the side of a flat bed without using bedrails?: A Lot Help needed moving to and from a bed to a chair (including a wheelchair)?: Total Help needed standing up from a chair using your arms (e.g., wheelchair or bedside chair)?: Total Help needed to walk in hospital room?: Total Help needed climbing 3-5 steps with a railing? : Total 6 Click Score: 8    End of Session Equipment Utilized During Treatment: Oxygen;Other (comment) (trach mask oxygen) Activity Tolerance: Patient tolerated treatment well Patient left: in chair;with call bell/phone within reach;with  family/visitor present   PT Visit Diagnosis: Unsteadiness on feet (R26.81);Muscle weakness (generalized) (M62.81)     Time: 9470-9628 PT Time Calculation (min) (ACUTE ONLY): 34 min  Charges:  $Therapeutic Activity: 8-22 mins                     [name], PT, MPT    Percell Locus 06/29/2022, 12:25 PM

## 2022-06-29 NOTE — Progress Notes (Signed)
IR received request for percutaneous G-tube, CT ordered and reviewed by Dr. Anselm Pancoast. Anatomy appears amendable to percutaneous approach, however would need barium the evening before the procedure via NGT to make sure procedure is able to be performed. The patient was clinically not ready for IR procedure and was febrile and being treated with antibiotics. Now, the patient appears to be improving clinically and may no longer require G-tube placement. I have discussed this with the critical care NP, Donell Beers and I will cancel the G-tube order for now. The primary team is aware and will re-order the G-tube if needed at a later date.   Hedy Jacob, PA-C 06/29/2022, 10:24 AM

## 2022-06-30 DIAGNOSIS — J9602 Acute respiratory failure with hypercapnia: Secondary | ICD-10-CM | POA: Diagnosis not present

## 2022-06-30 DIAGNOSIS — J9601 Acute respiratory failure with hypoxia: Secondary | ICD-10-CM | POA: Diagnosis not present

## 2022-06-30 LAB — GLUCOSE, CAPILLARY
Glucose-Capillary: 141 mg/dL — ABNORMAL HIGH (ref 70–99)
Glucose-Capillary: 159 mg/dL — ABNORMAL HIGH (ref 70–99)
Glucose-Capillary: 151 mg/dL — ABNORMAL HIGH (ref 70–99)
Glucose-Capillary: 137 mg/dL — ABNORMAL HIGH (ref 70–99)
Glucose-Capillary: 95 mg/dL (ref 70–99)

## 2022-06-30 LAB — CBC WITH DIFFERENTIAL/PLATELET
Abs Immature Granulocytes: 0.06 10*3/uL (ref 0.00–0.07)
Basophils Absolute: 0 10*3/uL (ref 0.0–0.1)
Basophils Relative: 0 %
Eosinophils Absolute: 0.2 10*3/uL (ref 0.0–0.5)
Eosinophils Relative: 2 %
HCT: 40.4 % (ref 36.0–46.0)
Hemoglobin: 11.2 g/dL — ABNORMAL LOW (ref 12.0–15.0)
Immature Granulocytes: 1 %
Lymphocytes Relative: 22 %
Lymphs Abs: 2.3 10*3/uL (ref 0.7–4.0)
MCH: 20.7 pg — ABNORMAL LOW (ref 26.0–34.0)
MCHC: 27.7 g/dL — ABNORMAL LOW (ref 30.0–36.0)
MCV: 74.5 fL — ABNORMAL LOW (ref 80.0–100.0)
Monocytes Absolute: 0.6 10*3/uL (ref 0.1–1.0)
Monocytes Relative: 6 %
Neutro Abs: 7.4 10*3/uL (ref 1.7–7.7)
Neutrophils Relative %: 69 %
Platelets: 380 10*3/uL (ref 150–400)
RBC: 5.42 MIL/uL — ABNORMAL HIGH (ref 3.87–5.11)
RDW: 27.3 % — ABNORMAL HIGH (ref 11.5–15.5)
Smear Review: NORMAL
WBC: 10.6 10*3/uL — ABNORMAL HIGH (ref 4.0–10.5)
nRBC: 0 % (ref 0.0–0.2)

## 2022-06-30 LAB — IRON AND TIBC
Iron: 49 ug/dL (ref 28–170)
Saturation Ratios: 20 % (ref 10.4–31.8)
TIBC: 248 ug/dL — ABNORMAL LOW (ref 250–450)
UIBC: 199 ug/dL

## 2022-06-30 LAB — BASIC METABOLIC PANEL
Anion gap: 9 (ref 5–15)
BUN: 26 mg/dL — ABNORMAL HIGH (ref 6–20)
CO2: 33 mmol/L — ABNORMAL HIGH (ref 22–32)
Calcium: 9.1 mg/dL (ref 8.9–10.3)
Chloride: 95 mmol/L — ABNORMAL LOW (ref 98–111)
Creatinine, Ser: 0.49 mg/dL (ref 0.44–1.00)
GFR, Estimated: >60 mL/min (ref >60)
Glucose, Bld: 155 mg/dL — ABNORMAL HIGH (ref 70–99)
Potassium: 4 mmol/L (ref 3.5–5.1)
Sodium: 137 mmol/L (ref 135–145)

## 2022-06-30 LAB — FERRITIN: Ferritin: 215 ng/mL (ref 11–307)

## 2022-06-30 LAB — PHOSPHORUS: Phosphorus: 5.4 mg/dL — ABNORMAL HIGH (ref 2.5–4.6)

## 2022-06-30 LAB — VITAMIN B12: Vitamin B-12: 513 pg/mL (ref 180–914)

## 2022-06-30 LAB — FOLATE: Folate: 17.9 ng/mL (ref >5.9)

## 2022-06-30 LAB — MAGNESIUM: Magnesium: 2.3 mg/dL (ref 1.7–2.4)

## 2022-06-30 MED ORDER — SODIUM BICARBONATE 650 MG PO TABS
650.0000 mg | ORAL_TABLET | Freq: Once | ORAL | Status: AC
  Administered 2022-06-30: 650 mg
  Filled 2022-06-30: qty 1

## 2022-06-30 MED ORDER — PANCRELIPASE (LIP-PROT-AMYL) 10440-39150 UNITS PO TABS
20880.0000 [IU] | ORAL_TABLET | Freq: Once | ORAL | Status: AC
  Administered 2022-06-30: 20880 [IU]
  Filled 2022-06-30: qty 2

## 2022-06-30 MED ORDER — PANCRELIPASE (LIP-PROT-AMYL) 10440-39150 UNITS PO TABS
20880.0000 [IU] | ORAL_TABLET | Freq: Once | ORAL | Status: AC
  Administered 2022-06-30: 20880 [IU]
  Filled 2022-06-30 (×2): qty 2

## 2022-06-30 NOTE — Progress Notes (Signed)
Dobhoff tube noted to be occluded. Attempts to declog unsuccessful x 2 with viokase and sodium bicarb. Attempt by Dr. Lanney Gins to clear tube with stylet without success. Dobhoff removed and will not replace at this time per Tana Conch NP. Plan is to have speech evaluate for passey muir valve on Monday. Pt does not want tube replaced.

## 2022-06-30 NOTE — Progress Notes (Signed)
Physical Therapy Treatment Patient Details Name: Desiree Stone MRN: [id number] DOB: 05-25-1975 Today's Date: 06/30/2022   History of Present Illness Patient is a 47 year old female with acute hypoxic and hypercapnic respiratory failure, acute COPD exacerbation, new onset CHF, and suspected community-acquired pneumonia requiring intubation and mechanical ventilation. History of tobacco use    PT Comments    Pt received upright in bed agreeable to PT/OT treatment. Pt required significant time with RN staff prior to PT/OT treatment for pt hygiene and rolling for placement of hoyer sling. PT/OT at end assisting with hoyer sling placement, managing lines and leads with pt lifted dependently from bedside to recliner due to pt's height stature to assist in successful transfer and gait training. Pt having difficulty communicating due to trach collar but follows all commands, is able to write on paper appropriately to talk with PT/OT. Pt endorses LLE sciatica with N/T, pain with prolonged sitting/lying and weakness in her knee but this is chronic In nature, not a new onset since acute admission. Pt progressing in strength today as she stands with minguard of 2 then of 1 tolerating 30 sec bouts/ STS with L lateral weight shifts for LLE strengthening. Pt is reliant on mod to max VC's for hand placement and support at RW to prevent posterior tipping. VSS throughout session. Pt quick to fatigue thus deferring gait attempts today as pt reports increased fatigue with bed mobility with nursing staff. Pt upright in recliner with LE's elevated. PT to continue recommending for AIR at this time.      Recommendations for follow up therapy are one component of a multi-disciplinary discharge planning process, led by the attending physician.  Recommendations may be updated based on patient status, additional functional criteria and insurance authorization.  Follow Up Recommendations  Acute inpatient rehab (3hours/day)      Assistance Recommended at Discharge Intermittent Supervision/Assistance  Patient can return home with the following A little help with walking and/or transfers;A little help with bathing/dressing/bathroom;Help with stairs or ramp for entrance;Assist for transportation   Equipment Recommendations  Other (comment) (TBD by next venue of care)    Recommendations for Other Services Rehab consult     Precautions / Restrictions Precautions Precautions: Fall Restrictions Weight Bearing Restrictions: No     Mobility  Bed Mobility Overal bed mobility: Needs Assistance Bed Mobility: Rolling Rolling: Min assist, Mod assist         General bed mobility comments: rolling for bowel hygiene Patient Response: Cooperative  Transfers Overall transfer level: Needs assistance Equipment used: Rolling walker (2 wheels) Transfers: Sit to/from Stand, Bed to chair/wheelchair/BSC Sit to Stand: Min guard, +2 physical assistance           General transfer comment: Use of ceiling lift to transfer from bed to chair due to pt's height to assist in transfers and gait from recliner. Pt progressing to minguard of 2 to 1 to stand from recliner with consistent VC's needed for safe hand placement. Pt does need steadying on RW as pt pulls with UE on RW to successfully stand despite VC's. Transfer via Lift Equipment: [place]  Ambulation/Gait             Pre-gait activities: L lateral weight shifts on last 2 standing bouts (x10/bout) General Gait Details: limited standing tolerance for progression of walking   Stairs             Wheelchair Mobility    Modified Rankin (Stroke Patients Only)       Balance  Overall balance assessment: Needs assistance Sitting-balance support: Feet supported Sitting balance-Leahy Scale: Good     Standing balance support: Bilateral upper extremity supported Standing balance-Leahy Scale: Poor Standing balance comment: Standing tolerance about 30  seconds; stood 3x during session                            Cognition Arousal/Alertness: Awake/alert Behavior During Therapy: WFL for tasks assessed/performed Overall Cognitive Status: Difficult to assess                                 General Comments: patient is able to follow single step commands consistently. Pleasant. Is able to write with pen and paper; unable to speak due to trach, but tries to mouth words and uses gestures.        Exercises General Exercises - Lower Extremity Long Arc Quad: AROM, Strengthening, Both, 10 reps, Seated    General Comments General comments (skin integrity, edema, etc.): Pt good at directing her care, although limited by trach (inability to speak).      Pertinent Vitals/Pain Pain Assessment Pain Assessment: No/denies pain    Home Living                          Prior Function            PT Goals (current goals can now be found in the care plan section) Acute Rehab PT Goals Patient Stated Goal: to walk, go home PT Goal Formulation: With patient Time For Goal Achievement: 07/10/22 Potential to Achieve Goals: Good Progress towards PT goals: Progressing toward goals    Frequency    Min 2X/week      PT Plan Current plan remains appropriate    Co-evaluation PT/OT/SLP Co-Evaluation/Treatment: Yes Reason for Co-Treatment: Complexity of the patient's impairments (multi-system involvement);For patient/therapist safety;To address functional/ADL transfers PT goals addressed during session: Mobility/safety with mobility OT goals addressed during session: Strengthening/ROM      AM-PAC PT "6 Clicks" Mobility   Outcome Measure  Help needed turning from your back to your side while in a flat bed without using bedrails?: A Lot Help needed moving from lying on your back to sitting on the side of a flat bed without using bedrails?: A Lot Help needed moving to and from a bed to a chair (including a  wheelchair)?: Total Help needed standing up from a chair using your arms (e.g., wheelchair or bedside chair)?: A Little Help needed to walk in hospital room?: Total Help needed climbing 3-5 steps with a railing? : Total 6 Click Score: 10    End of Session Equipment Utilized During Treatment: Gait belt;Oxygen;Other (comment) (Trach collar)   Patient left: in chair;with call bell/phone within reach Nurse Communication: Mobility status PT Visit Diagnosis: Unsteadiness on feet (R26.81);Muscle weakness (generalized) (M62.81)     Time: [id number] PT Time Calculation (min) (ACUTE ONLY): 33 min  Charges:  $Therapeutic Exercise: 8-22 mins                     Salem Caster. Fairly IV, PT, DPT Physical Therapist- [facility]  06/30/2022, 12:35 PM

## 2022-06-30 NOTE — Consult Note (Signed)
Physical Medicine and Rehabilitation Consult Reason for Consult: Pulmonary debility Referring Physician: Ottie Glazier, MD   HPI: Desiree Stone is a 47 y.o. female with acute hypoxic and hypercapnic respiratory failure, acute COPD exacerbation, new onset CHF, and suspected community acquired pneumonia requiring intubation and mechanical ventilation. Physical Medicine & Rehabilitation was consulted to assess candidacy for CIR.     Review of Systems  Constitutional: Negative.   HENT: Negative.    Eyes: Negative.   Cardiovascular: Negative.   Skin: Negative.    breathing comfortably Past Medical History:  Diagnosis Date   Asthma    Diabetes mellitus without complication ([place])    Hypertension    Past Surgical History:  Procedure Laterality Date   carpel tunnel     COLONOSCOPY WITH PROPOFOL N/A 12/08/2014   Procedure: COLONOSCOPY WITH PROPOFOL;  Surgeon: Lollie Sails, MD;  Location[facility] ENDOSCOPY;  Service: Endoscopy;  Laterality: N/A;   ESOPHAGOGASTRODUODENOSCOPY     TRACHEOSTOMY TUBE PLACEMENT N/A 06/19/2022   Procedure: TRACHEOSTOMY;  Surgeon: Beverly Gust, MD;  Location: [facility] ORS;  Service: ENT;  Laterality: N/A;   Family History  Problem Relation Age of Onset   Breast cancer Paternal Aunt 75   Cancer Paternal Grandmother        Colon   Social History:  reports that she quit smoking about 5 years ago. Her smoking use included cigarettes. She has never used smokeless tobacco. She reports that she does not drink alcohol and does not use drugs. Allergies:  Allergies  Allergen Reactions   Abilify [Aripiprazole] Other (See Comments)    Syncope   Effexor [Venlafaxine] Other (See Comments)    Hair Loss   Wellbutrin [Bupropion] Other (See Comments)    Acne    Medications Prior to Admission  Medication Sig Dispense Refill   albuterol (PROVENTIL HFA;VENTOLIN HFA) 108 (90 BASE) MCG/ACT inhaler Inhale into the lungs every 6 (six) hours as needed for  wheezing or shortness of breath.     atorvastatin (LIPITOR) 40 MG tablet Take 40 mg by mouth at bedtime.     cetirizine (ZYRTEC) 10 MG tablet Take 10 mg by mouth daily.     clindamycin (CLEOCIN T) 1 % external solution Apply 1 Application topically 2 (two) times daily.     cyanocobalamin 1000 MCG tablet Take 1,000 mcg by mouth daily.     enalapril (VASOTEC) 20 MG tablet Take 20 mg by mouth daily.     famotidine (PEPCID) 20 MG tablet Take 20 mg by mouth 2 (two) times daily.     fluticasone (FLOVENT HFA) 110 MCG/ACT inhaler Inhale 2 puffs into the lungs 2 (two) times daily.     hydrochlorothiazide (HYDRODIURIL) 25 MG tablet Take 25 mg by mouth daily.     hydrOXYzine (ATARAX) 25 MG tablet Take 25 mg by mouth 3 (three) times daily as needed.     ipratropium (ATROVENT HFA) 17 MCG/ACT inhaler Inhale 2 puffs into the lungs every 4 (four) hours as needed for wheezing.     liraglutide (VICTOZA) 18 MG/3ML SOPN Inject 2.4 mg into the skin daily.     metFORMIN (GLUCOPHAGE-XR) 500 MG 24 hr tablet Take 1,000 mg by mouth 2 (two) times daily with a meal.      Home: Home Living Family/patient expects to be discharged to:: Private residence Living Arrangements: Alone Available Help at Discharge: Family Type of Home: Apartment Home Access: Level entry Home Layout: One level Bathroom Shower/Tub: Chiropodist: [place]  Equipment: None  Functional History: Prior Function Prior Level of Function : Independent/Modified Independent, Driving Mobility Comments: patient is independent with ambulation, works as a Freight forwarder, drives ADLs Comments: Ind with self care and IADLs Functional Status:  Mobility: Bed Mobility Overal bed mobility: Needs Assistance Bed Mobility: Rolling Rolling: Min assist, Mod assist Supine to sit: HOB elevated Sit to supine: Mod assist, +2 for physical assistance General bed mobility comments: rolling for bowel hygiene Transfers Overall transfer level:  Needs assistance Equipment used: Rolling walker (2 wheels) Transfers: Sit to/from Stand, Bed to chair/wheelchair/BSC Sit to Stand: Min guard, +2 physical assistance Bed to/from chair/wheelchair/BSC transfer type:: Via Lift equipment Squat pivot transfers: Mod assist, +2 physical assistance Transfer via Lift Equipment: [place] transfer comment: Use of ceiling lift to transfer from bed to chair due to pt's height to assist in transfers and gait from recliner. Pt progressing to minguard of 2 to 1 to stand from recliner with consistent VC's needed for safe hand placement. Pt does need steadying on RW as pt pulls with UE on RW to successfully stand despite VC's. Ambulation/Gait General Gait Details: limited standing tolerance for progression of walking Pre-gait activities: L lateral weight shifts on last 2 standing bouts (x10/bout)    ADL: ADL Overall ADL's : Needs assistance/impaired Lower Body Dressing: Maximal assistance Lower Body Dressing Details (indicate cue type and reason): to don B socks Toileting- Clothing Manipulation and Hygiene: Bed level, Total assistance, +2 for physical assistance Toileting - Clothing Manipulation Details (indicate cue type and reason): pt with bowel incontinence in bed; nursing staff x2 assisting pt for hygiene; new purewick placed General ADL Comments: Dependent lift utilized for t/f to chair today 2/2 pt being 4 ft 11 in, so unable to stand from hospital bed.  Cognition: Cognition Overall Cognitive Status: Difficult to assess Orientation Level: Oriented X4 Cognition Arousal/Alertness: Awake/alert Behavior During Therapy: WFL for tasks assessed/performed Overall Cognitive Status: Difficult to assess General Comments: patient is able to follow single step commands consistently. Pleasant. Is able to write with pen and paper; unable to speak due to trach, but tries to mouth words and uses gestures. Difficult to assess due to: Tracheostomy  Blood  pressure 117/83, pulse (!) 102, temperature 98.6 F (37 C), temperature source Oral, resp. rate 19, height 4' 11"$  (1.499 m), weight 136 kg, last menstrual period 05/30/2022, SpO2 (!) 88 %. Physical Exam Gen: no distress, normal appearing HEENT: oral mucosa pink and moist, NCAT, trach in place Cardio: Reg rate Chest: normal effort, normal rate of breathing Abd: soft, non-distended Ext: no edema Psych: pleasant, normal affect Skin: intact Neuro: Alert and oriented x3 Musculoskeletal: Diffuse weakness  Results for orders placed or performed during the hospital encounter of 06/06/22 (from the past 24 hour(s))  Glucose, capillary     Status: Abnormal   Collection Time: 06/29/22  4:10 PM  Result Value Ref Range   Glucose-Capillary 172 (H) 70 - 99 mg/dL  Glucose, capillary     Status: Abnormal   Collection Time: 06/29/22  7:15 PM  Result Value Ref Range   Glucose-Capillary 171 (H) 70 - 99 mg/dL  Glucose, capillary     Status: Abnormal   Collection Time: 06/29/22 11:31 PM  Result Value Ref Range   Glucose-Capillary 139 (H) 70 - 99 mg/dL  Glucose, capillary     Status: Abnormal   Collection Time: 06/30/22  3:32 AM  Result Value Ref Range   Glucose-Capillary 141 (H) 70 - 99 mg/dL  CBC with Differential/Platelet  Status: Abnormal   Collection Time: 06/30/22  7:09 AM  Result Value Ref Range   WBC 10.6 (H) 4.0 - 10.5 K/uL   RBC 5.42 (H) 3.87 - 5.11 MIL/uL   Hemoglobin 11.2 (L) 12.0 - 15.0 g/dL   HCT 40.4 36.0 - 46.0 %   MCV 74.5 (L) 80.0 - 100.0 fL   MCH 20.7 (L) 26.0 - 34.0 pg   MCHC 27.7 (L) 30.0 - 36.0 g/dL   RDW 27.3 (H) 11.5 - 15.5 %   Platelets 380 150 - 400 K/uL   nRBC 0.0 0.0 - 0.2 %   Neutrophils Relative % 69 %   Neutro Abs 7.4 1.7 - 7.7 K/uL   Lymphocytes Relative 22 %   Lymphs Abs 2.3 0.7 - 4.0 K/uL   Monocytes Relative 6 %   Monocytes Absolute 0.6 0.1 - 1.0 K/uL   Eosinophils Relative 2 %   Eosinophils Absolute 0.2 0.0 - 0.5 K/uL   Basophils Relative 0 %    Basophils Absolute 0.0 0.0 - 0.1 K/uL   WBC Morphology MORPHOLOGY UNREMARKABLE    Smear Review Normal platelet morphology    Immature Granulocytes 1 %   Abs Immature Granulocytes 0.06 0.00 - 0.07 K/uL   Target Cells PRESENT   Basic metabolic panel     Status: Abnormal   Collection Time: 06/30/22  7:09 AM  Result Value Ref Range   Sodium 137 135 - 145 mmol/L   Potassium 4.0 3.5 - 5.1 mmol/L   Chloride 95 (L) 98 - 111 mmol/L   CO2 33 (H) 22 - 32 mmol/L   Glucose, Bld 155 (H) 70 - 99 mg/dL   BUN 26 (H) 6 - 20 mg/dL   Creatinine, Ser 0.49 0.44 - 1.00 mg/dL   Calcium 9.1 8.9 - 10.3 mg/dL   GFR, Estimated >60 >60 mL/min   Anion gap 9 5 - 15  Magnesium     Status: None   Collection Time: 06/30/22  7:09 AM  Result Value Ref Range   Magnesium 2.3 1.7 - 2.4 mg/dL  Phosphorus     Status: Abnormal   Collection Time: 06/30/22  7:09 AM  Result Value Ref Range   Phosphorus 5.4 (H) 2.5 - 4.6 mg/dL  Iron and TIBC     Status: Abnormal   Collection Time: 06/30/22  7:09 AM  Result Value Ref Range   Iron 49 28 - 170 ug/dL   TIBC 248 (L) 250 - 450 ug/dL   Saturation Ratios 20 10.4 - 31.8 %   UIBC 199 ug/dL  Ferritin     Status: None   Collection Time: 06/30/22  7:09 AM  Result Value Ref Range   Ferritin 215 11 - 307 ng/mL  Folate     Status: None   Collection Time: 06/30/22  7:09 AM  Result Value Ref Range   Folate 17.9 >5.9 ng/mL  Glucose, capillary     Status: Abnormal   Collection Time: 06/30/22  7:48 AM  Result Value Ref Range   Glucose-Capillary 159 (H) 70 - 99 mg/dL  Glucose, capillary     Status: Abnormal   Collection Time: 06/30/22 11:41 AM  Result Value Ref Range   Glucose-Capillary 151 (H) 70 - 99 mg/dL   No results found.   Assessment/Plan: Diagnosis: Cardiopulmonary debility Does the need for close, 24 hr/day medical supervision in concert with the patient's rehab needs make it unreasonable for this patient to be served in a less intensive setting? Yes Co-Morbidities  requiring supervision/potential  complications: acute hypercapnic respiratory failure,  Obesity: provide dietary education.  Depression: would benefit from neuropsych eval type 2 diabetes: monitor CBGs Due to bladder management, bowel management, safety, skin/wound care, disease management, medication administration, pain management, and patient education, does the patient require 24 hr/day rehab nursing? Yes Does the patient require coordinated care of a physician, rehab nurse, therapy disciplines of PT, OT, SLP to address physical and functional deficits in the context of the above medical diagnosis(es)? Yes Addressing deficits in the following areas: balance, endurance, locomotion, strength, transferring, bowel/bladder control, bathing, dressing, feeding, grooming, toileting, and speech Can the patient actively participate in an intensive therapy program of at least 3 hrs of therapy per day at least 5 days per week? Yes The potential for patient to make measurable gains while on inpatient rehab is excellent Anticipated functional outcomes upon discharge from inpatient rehab are supervision  with PT, supervision with OT, supervision with SLP. Estimated rehab length of stay to reach the above functional goals is: 12-16 days Anticipated discharge destination: Home Overall Rehab/Functional Prognosis: excellent  RECOMMENDATIONS: This patient's condition is appropriate for continued rehabilitative care in the following setting: CIR if patient has 24/7 supervision at home.  Patient has agreed to participate in recommended program. Potentially Note that insurance prior authorization may be required for reimbursement for recommended care.  Izora Ribas, MD 06/30/2022

## 2022-06-30 NOTE — Progress Notes (Signed)
Inpatient Rehab Coordinator Note:  I spoke with patient's mother, Stanton Kidney, over the phone to discuss CIR recommendations and goals/expectations of CIR stay.  We reviewed 3 hrs/day of therapy, physician follow up, and average length of stay 2 weeks (dependent upon progress) with goals of supervision.  Stanton Kidney states she will be able to stay with pt in her home and provide 24/7 supervision, if needed, at discharge from [facility] program.  Will need to discuss cost of care with patient, as it appears she is uninsured.  Note plans to try [facility] again Monday.  I will try to reach her over the phone versus in person on Monday.  I will not have a bed for pt to admit to CIR prior to next week.   Shann Medal, PT, DPT Admissions Coordinator [phone] 06/30/22  3:35 PM

## 2022-06-30 NOTE — TOC Progression Note (Signed)
Transition of Care [facility]) - Progression Note    Patient Details  Name: Desiree Stone MRN: [id number] Date of Birth: 01/26/1976  Transition of Care [facility]) CM/SW Contact  Shelbie Hutching, RN Phone Number: 06/30/2022, 3:37 PM  Clinical Narrative:    CIR looking into accepting patient.  They were able to speak with patient's mother and patient will have caregiver support.  Patient is planned for trach downsize on Monday.  After trach downsized speech can work on [employer] and starting oral diet.     Expected Discharge Plan: IP Rehab Facility Barriers to Discharge: Continued Medical Work up  Expected Discharge Plan and Services   Discharge Planning Services: CM Consult Post Acute Care Choice: IP Rehab Living arrangements for the past 2 months: Single Family Home                                       Social Determinants of Health (SDOH) Interventions SDOH Screenings   Food Insecurity: No Food Insecurity (06/17/2022)  Housing: Low Risk  (06/17/2022)  Transportation Needs: No Transportation Needs (06/17/2022)  Utilities: Not At Risk (06/17/2022)  Physical Activity: Sufficiently Active (05/23/2017)  Social Connections: Moderately Isolated (05/23/2017)  Stress: No Stress Concern Present (05/23/2017)  Tobacco Use: Medium Risk (06/20/2022)    Readmission Risk Interventions     No data to display

## 2022-06-30 NOTE — Progress Notes (Signed)
Occupational Therapy Treatment Patient Details Name: Desiree Stone MRN: [id number] DOB: March 10, 1976 Today's Date: 06/30/2022   History of present illness Patient is a 47 year old female with acute hypoxic and hypercapnic respiratory failure, acute COPD exacerbation, new onset CHF, and suspected community-acquired pneumonia requiring intubation and mechanical ventilation. History of tobacco use   OT comments  Pt received in bed. Appearing alert, motivated to work with therapy; willing to work with OT on standing. T/f with dependent lift to chair with assist of nursing 2/2 pt being too short to stand safely from EOB. See flowsheet below for further details of session. Left in chair with BIL LE elevated on another chair per pt request; with all needs in reach.  Patient will benefit from continued OT while in acute care. Pt is highly motivated, will tolerate 3 hours of therapy per day with rest breaks.    Recommendations for follow up therapy are one component of a multi-disciplinary discharge planning process, led by the attending physician.  Recommendations may be updated based on patient status, additional functional criteria and insurance authorization.    Follow Up Recommendations  Acute inpatient rehab (3hours/day)     Assistance Recommended at Discharge Frequent or constant Supervision/Assistance  Patient can return home with the following  Two people to help with walking and/or transfers;A lot of help with bathing/dressing/bathroom;Assist for transportation;Assistance with cooking/housework;Help with stairs or ramp for entrance   Equipment Recommendations  Other (comment) (defer to next venue of care)    Recommendations for Other Services      Precautions / Restrictions Precautions Precautions: Fall Restrictions Weight Bearing Restrictions: No       Mobility Bed Mobility Overal bed mobility: Needs Assistance Bed Mobility: Rolling Rolling: Min assist, Mod assist          General bed mobility comments: rolling for bowel hygiene    Transfers                   General transfer comment: overhead lift used to get patient from bed to chair for safely being able to focus on transfer training and standing activity. 3 bouts of standing performed from recliner with CGA x2; pt requiring cues for hand placement (R hand on arm rest and L hand on RW); OT stabilizing RW to keep it from tipping as pt pushing significantly with LUE.  Short rest breaks between bouts of standing. Pillow placed on chair for pt comfort, as she states that yesterday her buttocks began hurting after sitting for a while; chair placed in front of pt for her to place legs on at pt's request; pillow under BIL legs. Purewick in place.     Balance Overall balance assessment: Needs assistance Sitting-balance support: Feet supported Sitting balance-Leahy Scale: Good     Standing balance support: Bilateral upper extremity supported Standing balance-Leahy Scale: Poor Standing balance comment: Standing tolerance about 30 seconds; stood 3x during session                           ADL either performed or assessed with clinical judgement   ADL Overall ADL's : Needs assistance/impaired                             Toileting- Clothing Manipulation and Hygiene: Bed level;Total assistance;+2 for physical assistance Toileting - Clothing Manipulation Details (indicate cue type and reason): pt with bowel incontinence in bed; nursing staff x2  assisting pt for hygiene; new purewick placed       General ADL Comments: Dependent lift utilized for t/f to chair today 2/2 pt being 4 ft 11 in, so unable to stand from hospital bed.    Extremity/Trunk Assessment Upper Extremity Assessment Upper Extremity Assessment: Generalized weakness;Overall [facility] for tasks assessed   Lower Extremity Assessment Lower Extremity Assessment: Generalized weakness;Defer to PT evaluation        Vision        Perception     Praxis      Cognition Arousal/Alertness: Awake/alert Behavior During Therapy: [facility] for tasks assessed/performed Overall Cognitive Status: Difficult to assess                                 General Comments: patient is able to follow single step commands consistently. Pleasant. Is able to write with pen and paper; unable to speak due to trach, but tries to mouth words and uses gestures.        Exercises      Shoulder Instructions       General Comments Pt good at directing her care, although limited by trach (inability to speak).    Pertinent Vitals/ Pain       Pain Assessment Pain Assessment: No/denies pain  Home Living                                          Prior Functioning/Environment              Frequency  Min 3X/week        Progress Toward Goals  OT Goals(current goals can now be found in the care plan section)  Progress towards OT goals: Progressing toward goals  Acute Rehab OT Goals Patient Stated Goal: Get stronger OT Goal Formulation: With patient Time For Goal Achievement: 07/10/22 Potential to Achieve Goals: Good ADL Goals Pt Will Perform Grooming: with min assist;standing Pt Will Perform Lower Body Dressing: with min assist;sit to/from stand Pt Will Transfer to Toilet: with min assist;ambulating Pt Will Perform Toileting - Clothing Manipulation and hygiene: with min assist;sit to/from stand  Plan Discharge plan remains appropriate;Frequency remains appropriate    Co-evaluation    PT/OT/SLP Co-Evaluation/Treatment: Yes Reason for Co-Treatment: Complexity of the patient's impairments (multi-system involvement);For patient/therapist safety;To address functional/ADL transfers   OT goals addressed during session: Strengthening/ROM      AM-PAC OT "6 Clicks" Daily Activity     Outcome Measure   Help from another person eating meals?: None Help from another person taking care of  personal grooming?: A Little Help from another person toileting, which includes using toliet, bedpan, or urinal?: Total Help from another person bathing (including washing, rinsing, drying)?: A Lot Help from another person to put on and taking off regular upper body clothing?: A Little Help from another person to put on and taking off regular lower body clothing?: Total 6 Click Score: 14    End of Session Equipment Utilized During Treatment: Rolling walker (2 wheels) (bariatric RW)  OT Visit Diagnosis: Unsteadiness on feet (R26.81);Repeated falls (R29.6);Muscle weakness (generalized) (M62.81)   Activity Tolerance Patient tolerated treatment well   Patient Left in chair;with chair alarm set;with call bell/phone within reach   Nurse Communication Mobility status        Time: [id number] OT Time Calculation (min): 30 min  Charges: OT General Charges $OT Visit: 1 Visit OT Treatments $Therapeutic Activity: 8-22 mins  Waymon Amato, MS, OTR/L   Vania Rea 06/30/2022, 11:47 AM

## 2022-06-30 NOTE — Progress Notes (Signed)
NAME:  Desiree Stone, MRN:  [id number], DOB:  Apr 26, 1976, LOS: 4 ADMISSION DATE:  06/06/2022, CONSULTATION DATE:  06/07/2022 REFERRING MD:  Dr. Tamala Julian, CHIEF COMPLAINT:  Shortness of breath   Brief Pt Description / Synopsis:  47 year old female admitted with acute hypoxic and hypercapnic respiratory failure in the setting of acute COPD exacerbation, new onset CHF, and suspected community-acquired pneumonia requiring intubation and mechanical ventilation.  History of Present Illness:  47 yo F presenting to [facility] ED from home with complaints of increasing shortness of breath. History provided per chart review, some items confirmed with her mother via phone interview. Patient reported on arrival to increased dyspnea over the past few months, worsening acutely in the last couple of days. She stated she could hardly walk down the hallway at the school and had to stop and rest. She denied outpatient oxygen use and has been unable to use her CPAP as she is unable to sleep in her bed. She instead has been sleeping in her chair for the past several weeks. She endorsed continued cigarette smoking. In addition she reported a productive cough with thick sputum and palpitations, as well as lower extremity edema. She denied difficulty swallowing, increased abdominal distention, or heart failure history. 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 Lab work significant for mild hyponatremia, hyperglycemia, leukocytosis, elevated but flat troponin and mildly elevated BNP. She was negative for COVID, RSV & influenza. Her respiratory status deteriorated and she was requiring 4 L [place]. CTa imaging was negative for PE, but did reveal cardiomegaly and an enlarged pulmonary artery suggestive of pulmonary hypertension. TRH was consulted for admission. The  patient's respiratory status did not improve while waiting for bed placement in the ED, with ABG demonstrating respiratory acidosis. The patient was placed on BIPAP. Follow up ABG's showed persistent respiratory acidosis even with setting adjustments. Eventually the decision was made to urgently intubate the patient and place her on mechanical ventilatory support. Medications given: lasix 40 mg, IV contrast, fentanyl, rocuronium & etomidate, lovenox, duoneb, solu medrol Initial Vitals: 98.4, 24, 98, 136/99 & 90% on 2 L [place] Significant labs: (Labs/ Imaging personally reviewed) I, Domingo Pulse [place], AGACNP-BC, personally viewed and interpreted this ECG. EKG Interpretation: Date: 06/06/22, EKG Time: 12:29, Rate: 104, Rhythm: ST, QRS Axis:  LAD, Intervals: normal, ST/T Wave abnormalities: non specific T wave abnormalities, Narrative Interpretation: ST with LAD and non specific T wave abnormalities Chemistry: Na+: 134, K+: 3.9, BUN/Cr.: 6/ 0.52, Serum CO2/ AG: 31/10, Cl: 93   CXR 06/06/22: no active cardiopulmonary disease. No evidence of pneumonia or pulmonary edema. Bronchitis/reactive airway disease, likely chronic. Borderline cardiomegaly CT soft tissue neck w contrast 06/06/22: no acute finding in the neck CT angio chest PE 06/06/22: no large central PE. The distal pulmonary arteries are not well evaluated due to the patient body habitus. No other evidence of acute cardiopulmonary pathology. Cardiomegaly with enlarged pulmonary artery suggesting pulmonary hypertension and refluxed contrast into the IVC, suggesting right heart dysfunction. Left adrenal nodule has been present since 2012 consistent with a benign adenoma.   PCCM consulted for admission due to worsening acute respiratory failure suspect multifocal secondary to acute new onset heart failure & suspected COPD in the setting of suspected pulmonary hypertension requiring emergent intubation and mechanical ventilatory support.  Please see  "significant  hospital events" section below for full detailed hospital course.  Pertinent  Medical History   Past Medical History:  Diagnosis Date   Asthma    Diabetes mellitus without complication ([place])    Hypertension     Micro Data:  1/16: SARS-CoV-2/influenza/RSV PCR>> negative 1/16: Group A strep PCR>> negative 1/17: MRSA PCR>> negative 1/17: HIV screen>> nonreactive 1/18: Blood culture x 2>> no growth  1/18: Tracheal aspirate>> normal respiratory flora  Antimicrobials:   Anti-infectives (From admission, onward)    Start     Dose/Rate Route Frequency Ordered Stop   06/21/22 1015  ceFEPIme (MAXIPIME) 2 g in sodium chloride 0.9 % 100 mL IVPB  Status:  Discontinued        2 g 200 mL/hr over 30 Minutes Intravenous Every 8 hours 06/21/22 0921 06/26/22 1007   06/13/22 2200  Ampicillin-Sulbactam (UNASYN) 3 g in sodium chloride 0.9 % 100 mL IVPB        3 g 200 mL/hr over 30 Minutes Intravenous Every 6 hours 06/13/22 1243 06/16/22 2150   06/12/22 0500  linezolid (ZYVOX) tablet 600 mg  Status:  Discontinued        600 mg Per Tube Every 12 hours 06/11/22 2301 06/13/22 1243   06/11/22 1400  piperacillin-tazobactam (ZOSYN) IVPB 3.375 g  Status:  Discontinued        3.375 g 12.5 mL/hr over 240 Minutes Intravenous Every 8 hours 06/11/22 1131 06/13/22 1243   06/11/22 1230  linezolid (ZYVOX) tablet 600 mg  Status:  Discontinued        600 mg Per Tube Every 12 hours 06/11/22 1131 06/11/22 2301   06/09/22 1100  cefTRIAXone (ROCEPHIN) 2 g in sodium chloride 0.9 % 100 mL IVPB  Status:  Discontinued        2 g 200 mL/hr over 30 Minutes Intravenous Every 24 hours 06/09/22 1006 06/11/22 1129   06/07/22 1100  azithromycin (ZITHROMAX) tablet 250 mg        250 mg Per Tube Daily 06/07/22 1006 06/11/22 0843       Significant Hospital Events: Including procedures, antibiotic start and stop dates in addition to other pertinent events   06/07/22: Admit to ICU with worsening acute respiratory  failure suspect secondary to acute new onset heart failure int he setting of suspected pulmonary hypertension & chronic COPD, OSA, asthma requiring emergent intubation and mechanical ventilatory support.  06/08/22- patient is s/p SBT with severe secretions, she did diurese >800cc today with lasix challenge, CXR with progressive pleural effusions. She has family coming in today. She's uisng OG and weve tranistioned to prednisone taper from solumedrol. Adding aldactone per tube. Iron workup today.  06/09/22-on minimal vent support, will attempt SBT.  Tracheal aspirate growing gram-positive cocci, ceftriaxone added 1/20 extubated and failed, re-intubated 1/21 severe COPD, remains on vent 1/22 severe COPD, remains on vent 1/23 severe upper airway swelling, ENT consulted 1/24 severe resp failure 1/26: Remains on vent, hemodynamically stable, tentative plan for Trach next Monday 1/29. 1/27: Remains on vent, no acute changes or events, awaiting Trach on Monday. D/C Solumedrol  1/28: Remains on vent, no acute changes.  BP soft, Lasix decreased to 40 mg daily. Low grade temperature overnight (T max 100), Obtain CT Chest 1/28: CT Chest>>Bilateral lower lobe consolidation, and right middle lobe       subsegmental atelectasis. No evidence of central endobronchial obstruction.       no evidence of pleural effusion. 2/01: CT Abd>>Portion of the transverse colon overlies the distal stomach.  Consider visualization of the transverse colon during percutaneous       gastrostomy tube placement. Persistent consolidation in both lower lobes.       Chronic left adrenal nodule. Minimal change since 2011 and likely a benign lesion.       Gallbladder distention without inflammatory changes. 2/02: Tolerated trach collar throughout the day  2/02: Speech therapy consulted for PMV trial, however pt did not tolerate PMV placement due to inability to redirect airflow superiorly for phonation and breathing.  Possibly will  require trach downsize when deemed appropriate 2/04: No acute events overnight.  Currently tolerating trach collar  2/06: No acute vents, tolerating TC during the day and resting on vent at night. Speech therapy/PT/OT following. ENT consulted pt pending downsize of trach on       02/12  06/30/22- patient improved slightly , plan for downsize trache collar.  She has defervesced post Cefepime course now planning for percutaneous PEG. We dcd non essential medications today. She's on trache collar off vent and she may qualify for CIR.   Interim History / Subjective:  No acute events overnight.  Pt tolerated TCT overnight   Objective   Blood pressure 137/79, pulse 94, temperature 99.1 F (37.3 C), temperature source Oral, resp. rate (!) 25, height 4' 11"$  (1.499 m), weight 136 kg, last menstrual period 05/30/2022, SpO2 91 %.    FiO2 (%):  [28 %] 28 %   Intake/Output Summary (Last 24 hours) at 06/30/2022 0809 Last data filed at 06/30/2022 0600 Gross per 24 hour  Intake 904.75 ml  Output 1451 ml  Net -546.25 ml    Filed Weights   06/27/22 0500 06/28/22 0500 06/29/22 0431  Weight: 135 kg 135.6 kg 136 kg    Examination: General: Acute on chronically obese female, laying in bed, NAD on mechanical ventilation in SBT  HENT: Atraumatic, normocephalic, neck supple, difficult to assess JVD due to body habitus Lungs: Diminished throughout, even, non labored, size 8 xlt midline dry/ intact   Cardiovascular: NSR, s1s2, no m/r/g, 2+ radial/2+ distal pulses, trace bilateral lower extremity edema  Abdomen: +BS x4, obese, soft, non distended, non tender  Extremities: No deformities, normal bulk and tone, moves all extremities  Neuro: Awake and following commands.  PERRLA GU: External catheter draining clear/yellow urine   Resolved Hospital Problem list   Acute Encephalopathy   Assessment & Plan:   #Acute hypoxic and hypercapnic respiratory failure in the setting of acute COPD exacerbation, new onset  CHF, and pneumonia PMHx: OSA on CPAP, asthma, tobacco use - SBT/TCT as tolerated  - Maintain O2 sats 88 to 92% - Follow intermittent Chest X-ray & ABG as needed - Continue VAP Bundle - Bronchodilators a - Diuresis as renal function and blood pressure permits  #Decompensated CHF  #Elevated Troponin secondary to demand ischemia PMHx: HTN, HLD Patient reports orthopnea, dyspnea on exertion & BLE edema. CTa suggestive of Right heart dysfunction and pulmonary HTN  Echocardiogram 06/07/22: LVEF 60 to [id number], normal diastolic parameters, RV function normal - Continuous cardiac monitoring - Maintain MAP >65 - Diuresis as renal function and blood pressure permits~pt currently net positive continue lasix bid   #HAP~treated   - Trend WBC and monitor fever curve  #Hypokalemia~improving   - Trend BMP - Replace electrolytes as indicated   #Diarrhea- RESOLVED - GI panel results pending   #Type II diabetes mellitus~difficult to control   - SSI, scheduled novolog, and semglee  - Diabetes coordinator consulted appreciate input    Best  Practice (right click and "Reselect all SmartList Selections" daily)   Diet/type: tubefeeds; speech therapy consulted  DVT prophylaxis: Lovenox GI prophylaxis: Pepcid  Lines: PICC line, and is still needed Foley:  N/A Code Status:  full code Last date of multidisciplinary goals of care discussion [06/29/2022]  02/8: Updated pt regarding current plan of care and all questions answered  Labs   CBC: Recent Labs  Lab 06/26/22 0347 06/27/22 0604 06/28/22 0600 06/29/22 0436 06/30/22 0709  WBC 14.4* 14.2* 13.3* 12.0* 10.6*  NEUTROABS  --   --   --  8.8* 7.4  HGB 9.1* 10.2* 10.3* 10.7* 11.2*  HCT 34.0* 38.0 37.8 39.8 40.4  MCV 74.2* 74.2* 74.3* 74.7* 74.5*  PLT 333 392 387 381 380     Basic Metabolic Panel: Recent Labs  Lab 06/26/22 0347 06/27/22 0604 06/28/22 0600 06/29/22 0436 06/30/22 0709  NA 142 142 142 141 137  K 3.9 3.8 3.6 3.5 4.0  CL 104  99 102 98 95*  CO2 29 31 32 33* 33*  GLUCOSE 244* 232* 152* 176* 155*  BUN 20 20 24* 31* 26*  CREATININE 0.45 0.42* 0.38* 0.53 0.49  CALCIUM 8.5* 9.1 9.4 9.4 9.1  MG 2.1 2.2 2.2 2.3 2.3  PHOS 3.1 3.7 4.6 5.6* 5.4*    GFR: Estimated Creatinine Clearance: 109 mL/min (by C-G formula based on SCr of 0.49 mg/dL). Recent Labs  Lab 06/27/22 0604 06/28/22 0600 06/29/22 0436 06/30/22 0709  WBC 14.2* 13.3* 12.0* 10.6*     Liver Function Tests: Recent Labs  Lab 06/24/22 0531 06/24/22 1647 06/25/22 0353 06/26/22 0347  ALBUMIN 2.6* 2.5* 2.4* 2.4*    No results for input(s): "LIPASE", "AMYLASE" in the last 168 hours. No results for input(s): "AMMONIA" in the last 168 hours.  ABG    Component Value Date/Time   PHART 7.42 06/07/2022 0828   PCO2ART 60 (H) 06/07/2022 0828   PO2ART 68 (L) 06/07/2022 0828   HCO3 38.9 (H) 06/07/2022 0828   O2SAT 95.7 06/07/2022 0828     Coagulation Profile: No results for input(s): "INR", "PROTIME" in the last 168 hours.  Cardiac Enzymes: No results for input(s): "CKTOTAL", "CKMB", "CKMBINDEX", "TROPONINI" in the last 168 hours.  HbA1C: Hgb A1c MFr Bld  Date/Time Value Ref Range Status  06/07/2022 04:54 AM 9.0 (H) 4.8 - 5.6 % Final    Comment:    (NOTE) Pre diabetes:          5.7%-6.4%  Diabetes:              >6.4%  Glycemic control for   <7.0% adults with diabetes     CBG: Recent Labs  Lab 06/29/22 1610 06/29/22 1915 06/29/22 2331 06/30/22 0332 06/30/22 0748  GLUCAP 172* 171* 139* 141* 159*     Review of Systems:   Unable to assess due to intubation and sedation  Past Medical History:  She,  has a past medical history of Asthma, Diabetes mellitus without complication ([place]), and Hypertension.   Surgical History:   Past Surgical History:  Procedure Laterality Date   carpel tunnel     COLONOSCOPY WITH PROPOFOL N/A 12/08/2014   Procedure: COLONOSCOPY WITH PROPOFOL;  Surgeon: Lollie Sails, MD;  Location: [facility]  END[facility];  Service: Endoscopy;  Laterality: N/A;   ESOPHAGOGASTRODUODENOSCOPY     TRACHEOSTOMY TUBE PLACEMENT N/A 06/19/2022   Procedure: TRACHEOSTOMY;  Surgeon: Beverly Gust, MD;  Location: [facility] ORS;  Service: ENT;  Laterality: N/A;     Social History:  reports that she quit smoking about 5 years ago. Her smoking use included cigarettes. She has never used smokeless tobacco. She reports that she does not drink alcohol and does not use drugs.   Family History:  Her family history includes Breast cancer (age of onset: 31) in her paternal aunt; Cancer in her paternal grandmother.   Allergies Allergies  Allergen Reactions   Abilify [Aripiprazole] Other (See Comments)    Syncope   Effexor [Venlafaxine] Other (See Comments)    Hair Loss   Wellbutrin [Bupropion] Other (See Comments)    Acne      Home Medications  Prior to Admission medications   Medication Sig Start Date End Date Taking? Authorizing Provider  albuterol (PROVENTIL HFA;VENTOLIN HFA) 108 (90 BASE) MCG/ACT inhaler Inhale into the lungs every 6 (six) hours as needed for wheezing or shortness of breath.   Yes [provider]  atorvastatin (LIPITOR) 40 MG tablet Take 40 mg by mouth at bedtime. 05/06/20  Yes [provider]  cetirizine (ZYRTEC) 10 MG tablet Take 10 mg by mouth daily.   Yes [provider]  clindamycin (CLEOCIN T) 1 % external solution Apply 1 Application topically 2 (two) times daily.   Yes [provider]  cyanocobalamin 1000 MCG tablet Take 1,000 mcg by mouth daily.   Yes [provider]  enalapril (VASOTEC) 20 MG tablet Take 20 mg by mouth daily.   Yes [provider]  famotidine (PEPCID) 20 MG tablet Take 20 mg by mouth 2 (two) times daily.   Yes [provider]  fluticasone (FLOVENT HFA) 110 MCG/ACT inhaler Inhale 2 puffs into the lungs 2 (two) times daily.   Yes [provider]  hydrochlorothiazide (HYDRODIURIL) 25 MG tablet Take  25 mg by mouth daily.   Yes [provider]  hydrOXYzine (ATARAX) 25 MG tablet Take 25 mg by mouth 3 (three) times daily as needed.   Yes [provider]  ipratropium (ATROVENT HFA) 17 MCG/ACT inhaler Inhale 2 puffs into the lungs every 4 (four) hours as needed for wheezing.   Yes [provider]  liraglutide (VICTOZA) 18 MG/3ML SOPN Inject 2.4 mg into the skin daily.   Yes [provider]  metFORMIN (GLUCOPHAGE-XR) 500 MG 24 hr tablet Take 1,000 mg by mouth 2 (two) times daily with a meal.   Yes [provider]     Critical care time: 30 minutes     Donell Beers, [place] Pager [phone] (please enter 7 digits) [place] Pager [phone] (please enter 7 digits)       ICU ATTENDING ATTESTATION:  Patient seen and examined and relevant ancillary tests reviewed.   I agree with the assessment and plan of care as outlined by Donell Beers NP.    BP 137/79   Pulse 94   Temp 99.1 F (37.3 C) (Oral)   Resp (!) 25   Ht 4' 11"$  (1.499 m)   Wt 136 kg   LMP 05/30/2022   SpO2 91%   BMI 60.56 kg/m   CBC    Component Value Date/Time   WBC 10.6 (H) 06/30/2022 0709   RBC 5.42 (H) 06/30/2022 0709   HGB 11.2 (L) 06/30/2022 0709   HGB 12.6 05/13/2020 1534   HCT 40.4 06/30/2022 0709   HCT 41.7 05/13/2020 1534   PLT 380 06/30/2022 0709   PLT 297 05/13/2020 1534   MCV 74.5 (L) 06/30/2022 0709   MCV 71 (L) 05/13/2020 1534   MCH 20.7 (L)  06/30/2022 0709   MCHC 27.7 (L) 06/30/2022 0709   RDW 27.3 (H) 06/30/2022 0709   RDW 18.1 (H) 05/13/2020 1534   LYMPHSABS 2.3 06/30/2022 0709   LYMPHSABS 3.8 (H) 05/13/2020 1534   MONOABS 0.6 06/30/2022 0709   EOSABS 0.2 06/30/2022 0709   EOSABS 0.1 05/13/2020 1534   BASOSABS 0.0 06/30/2022 0709   BASOSABS 0.1 05/13/2020 1534        Latest Ref Rng & Units 06/30/2022    7:09 AM 06/29/2022    4:36 AM 06/28/2022    6:00 AM  BMP  Glucose 70 - 99 mg/dL 155  176  152   BUN 6 - 20  mg/dL 26  31  24   $ Creatinine 0.44 - 1.00 mg/dL 0.49  0.53  0.38   Sodium 135 - 145 mmol/L 137  141  142   Potassium 3.5 - 5.1 mmol/L 4.0  3.5  3.6   Chloride 98 - 111 mmol/L 95  98  102   CO2 22 - 32 mmol/L 33  33  32   Calcium 8.9 - 10.3 mg/dL 9.1  9.4  9.4      Critical care provider statement:   Total critical care time: 33 minutes   Performed by: Lanney Gins MD   Critical care time was exclusive of separately billable procedures and treating other patients.   Critical care was necessary to treat or prevent imminent or life-threatening deterioration.   Critical care was time spent personally by me on the following activities: development of treatment plan with patient and/or surrogate as well as nursing, discussions with consultants, evaluation of patient's response to treatment, examination of patient, obtaining history from patient or surrogate, ordering and performing treatments and interventions, ordering and review of laboratory studies, ordering and review of radiographic studies, pulse oximetry and re-evaluation of patient's condition.    Ottie Glazier, M.D.  Pulmonary & Critical Care Medicine

## 2022-06-30 NOTE — Consult Note (Signed)
PHARMACY CONSULT NOTE - ELECTROLYTES  Pharmacy Consult for Electrolyte Monitoring and Replacement   Recent Labs: Potassium (mmol/L)  Date Value  06/30/2022 4.0   Magnesium (mg/dL)  Date Value  06/30/2022 2.3   Calcium (mg/dL)  Date Value  06/30/2022 9.1   Albumin (g/dL)  Date Value  06/26/2022 2.4 (L)   Phosphorus (mg/dL)  Date Value  06/30/2022 5.4 (H)   Sodium (mmol/L)  Date Value  06/30/2022 137   Assessment 47 y.o. female presenting with AHRF. PMH significant for asthma, T2DM, HTN, OSA, tobacco use (32 pack-years), obesity. Pharmacy has been consulted to monitor and replace electrolytes while in CCU.  Diet/feeding: Glucerna 1.5@65ml$ /hr + FWF 74m q4h  Pertinent medications:  furosemide IV 40 mg BID  Goal of Therapy: Electrolytes WNL  Plan:  - 10 mEq IV KCl x 2 - recheck electrolytes in am  Thank you for allowing pharmacy to be a part of this patient's care.  RDallie Piles PharmD, BCPS Clinical Pharmacist   06/30/2022 12:40 PM

## 2022-06-30 NOTE — Progress Notes (Signed)
   06/30/22 1000  Spiritual Encounters  Care provided to: Pt and family  Referral source IDT Rounds  Reason for visit Routine spiritual support  OnCall Visit No  Spiritual Framework  Community/Connection Family  Patient Stress Factors None identified  Family Stress Factors None identified  Interventions  Spiritual Care Interventions Made Established relationship of care and support;Compassionate presence  Spiritual Care Plan  Spiritual Care Issues Still Outstanding Chaplain will continue to follow   Visit patients and mother on a routine round. Patient able to communicate through head movements, hand movements, and facial expressions. Advise her and her mother that a chaplain is available 24/7

## 2022-07-01 ENCOUNTER — Inpatient Hospital Stay: Payer: Medicaid Other

## 2022-07-01 LAB — RENAL FUNCTION PANEL
Albumin: 2.9 g/dL — ABNORMAL LOW (ref 3.5–5.0)
Anion gap: 9 (ref 5–15)
BUN: 23 mg/dL — ABNORMAL HIGH (ref 6–20)
CO2: 34 mmol/L — ABNORMAL HIGH (ref 22–32)
Calcium: 9.2 mg/dL (ref 8.9–10.3)
Chloride: 93 mmol/L — ABNORMAL LOW (ref 98–111)
Creatinine, Ser: 0.47 mg/dL (ref 0.44–1.00)
GFR, Estimated: >60 mL/min (ref >60)
Glucose, Bld: 117 mg/dL — ABNORMAL HIGH (ref 70–99)
Phosphorus: 6.3 mg/dL — ABNORMAL HIGH (ref 2.5–4.6)
Potassium: 3.4 mmol/L — ABNORMAL LOW (ref 3.5–5.1)
Sodium: 136 mmol/L (ref 135–145)

## 2022-07-01 LAB — CBC
HCT: 39.8 % (ref 36.0–46.0)
Hemoglobin: 11 g/dL — ABNORMAL LOW (ref 12.0–15.0)
MCH: 20.7 pg — ABNORMAL LOW (ref 26.0–34.0)
MCHC: 27.6 g/dL — ABNORMAL LOW (ref 30.0–36.0)
MCV: 74.8 fL — ABNORMAL LOW (ref 80.0–100.0)
Platelets: 364 10*3/uL (ref 150–400)
RBC: 5.32 MIL/uL — ABNORMAL HIGH (ref 3.87–5.11)
RDW: 27 % — ABNORMAL HIGH (ref 11.5–15.5)
WBC: 9.5 10*3/uL (ref 4.0–10.5)
nRBC: 0 % (ref 0.0–0.2)

## 2022-07-01 LAB — GLUCOSE, CAPILLARY
Glucose-Capillary: 113 mg/dL — ABNORMAL HIGH (ref 70–99)
Glucose-Capillary: 121 mg/dL — ABNORMAL HIGH (ref 70–99)
Glucose-Capillary: 131 mg/dL — ABNORMAL HIGH (ref 70–99)
Glucose-Capillary: 134 mg/dL — ABNORMAL HIGH (ref 70–99)
Glucose-Capillary: 137 mg/dL — ABNORMAL HIGH (ref 70–99)
Glucose-Capillary: 145 mg/dL — ABNORMAL HIGH (ref 70–99)
Glucose-Capillary: 146 mg/dL — ABNORMAL HIGH (ref 70–99)

## 2022-07-01 MED ORDER — POTASSIUM CHLORIDE CRYS ER 20 MEQ PO TBCR: 40.0000 meq | EXTENDED_RELEASE_TABLET | Freq: Once | ORAL | Status: DC

## 2022-07-01 MED ORDER — POTASSIUM CHLORIDE 20 MEQ PO PACK
40.0000 meq | PACK | Freq: Once | ORAL | Status: AC
  Administered 2022-07-01: 40 meq via NASOGASTRIC
  Filled 2022-07-01: qty 2

## 2022-07-01 MED ORDER — POTASSIUM CHLORIDE 10 MEQ/100ML IV SOLN
10.0000 meq | INTRAVENOUS | Status: DC
  Administered 2022-07-01: 10 meq via INTRAVENOUS
  Filled 2022-07-01 (×3): qty 100

## 2022-07-01 NOTE — Consult Note (Signed)
PHARMACY CONSULT NOTE - ELECTROLYTES  Pharmacy Consult for Electrolyte Monitoring and Replacement   Recent Labs: Potassium (mmol/L)  Date Value  07/01/2022 3.4 (L)   Magnesium (mg/dL)  Date Value  06/30/2022 2.3   Calcium (mg/dL)  Date Value  07/01/2022 9.2   Albumin (g/dL)  Date Value  07/01/2022 2.9 (L)   Phosphorus (mg/dL)  Date Value  07/01/2022 6.3 (H)   Sodium (mmol/L)  Date Value  07/01/2022 136   Assessment 47 y.o. female presenting with AHRF. PMH significant for asthma, T2DM, HTN, OSA, tobacco use (32 pack-years), obesity. Pharmacy has been consulted to monitor and replace electrolytes while in CCU.  Diet/feeding: Glucerna 1.5@65ml$ /hr + FWF 8m q4h  Pertinent medications:  furosemide IV 40 mg BID  Goal of Therapy: Electrolytes WNL  Plan:  - Kcl 40 mEq PO x 1.  - recheck electrolytes in am  Thank you for allowing pharmacy to be a part of this patient's care.  KOswald Hillock PharmD, BCPS Clinical Pharmacist   07/01/2022 7:16 AM

## 2022-07-01 NOTE — Progress Notes (Signed)
Report called to [facility], care nurse to receive patient. RT also aware; will transfer when bed available.

## 2022-07-01 NOTE — Progress Notes (Signed)
NAME:  Desiree Stone, MRN:  [id number], DOB:  09-20-1975, LOS: 24 ADMISSION DATE:  06/06/2022, CONSULTATION DATE:  06/07/2022 REFERRING MD:  Dr. Tamala Julian, CHIEF COMPLAINT:  Shortness of breath   Brief Pt Description / Synopsis:  47 year old female admitted with acute hypoxic and hypercapnic respiratory failure in the setting of acute COPD exacerbation, new onset CHF, and suspected community-acquired pneumonia requiring intubation and mechanical ventilation.  History of Present Illness:  47 yo F presenting to [facility] ED from home with complaints of increasing shortness of breath. History provided per chart review, some items confirmed with her mother via phone interview. Patient reported on arrival to increased dyspnea over the past few months, worsening acutely in the last couple of days. She stated she could hardly walk down the hallway at the school and had to stop and rest. She denied outpatient oxygen use and has been unable to use her CPAP as she is unable to sleep in her bed. She instead has been sleeping in her chair for the past several weeks. She endorsed continued cigarette smoking. In addition she reported a productive cough with thick sputum and palpitations, as well as lower extremity edema. She denied difficulty swallowing, increased abdominal distention, or heart failure history. 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 Lab work significant for mild hyponatremia, hyperglycemia, leukocytosis, elevated but flat troponin and mildly elevated BNP. She was negative for COVID, RSV & influenza. Her respiratory status deteriorated and she was requiring 4 L [place]. CTa imaging was negative for PE, but did reveal cardiomegaly and an enlarged pulmonary artery suggestive of pulmonary hypertension. TRH was consulted for admission. The  patient's respiratory status did not improve while waiting for bed placement in the ED, with ABG demonstrating respiratory acidosis. The patient was placed on BIPAP. Follow up ABG's showed persistent respiratory acidosis even with setting adjustments. Eventually the decision was made to urgently intubate the patient and place her on mechanical ventilatory support. Medications given: lasix 40 mg, IV contrast, fentanyl, rocuronium & etomidate, lovenox, duoneb, solu medrol Initial Vitals: 98.4, 24, 98, 136/99 & 90% on 2 L [place] Significant labs: (Labs/ Imaging personally reviewed) I, Domingo Pulse [place], AGACNP-BC, personally viewed and interpreted this ECG. EKG Interpretation: Date: 06/06/22, EKG Time: 12:29, Rate: 104, Rhythm: ST, QRS Axis:  LAD, Intervals: normal, ST/T Wave abnormalities: non specific T wave abnormalities, Narrative Interpretation: ST with LAD and non specific T wave abnormalities Chemistry: Na+: 134, K+: 3.9, BUN/Cr.: 6/ 0.52, Serum CO2/ AG: 31/10, Cl: 93   CXR 06/06/22: no active cardiopulmonary disease. No evidence of pneumonia or pulmonary edema. Bronchitis/reactive airway disease, likely chronic. Borderline cardiomegaly CT soft tissue neck w contrast 06/06/22: no acute finding in the neck CT angio chest PE 06/06/22: no large central PE. The distal pulmonary arteries are not well evaluated due to the patient body habitus. No other evidence of acute cardiopulmonary pathology. Cardiomegaly with enlarged pulmonary artery suggesting pulmonary hypertension and refluxed contrast into the IVC, suggesting right heart dysfunction. Left adrenal nodule has been present since 2012 consistent with a benign adenoma.   PCCM consulted for admission due to worsening acute respiratory failure suspect multifocal secondary to acute new onset heart failure & suspected COPD in the setting of suspected pulmonary hypertension requiring emergent intubation and mechanical ventilatory support.  Please see  "significant  hospital events" section below for full detailed hospital course.  Pertinent  Medical History   Past Medical History:  Diagnosis Date   Asthma    Diabetes mellitus without complication ([place])    Hypertension     Micro Data:  1/16: SARS-CoV-2/influenza/RSV PCR>> negative 1/16: Group A strep PCR>> negative 1/17: MRSA PCR>> negative 1/17: HIV screen>> nonreactive 1/18: Blood culture x 2>> no growth  1/18: Tracheal aspirate>> normal respiratory flora  Antimicrobials:   Anti-infectives (From admission, onward)    Start     Dose/Rate Route Frequency Ordered Stop   06/21/22 1015  ceFEPIme (MAXIPIME) 2 g in sodium chloride 0.9 % 100 mL IVPB  Status:  Discontinued        2 g 200 mL/hr over 30 Minutes Intravenous Every 8 hours 06/21/22 0921 06/26/22 1007   06/13/22 2200  Ampicillin-Sulbactam (UNASYN) 3 g in sodium chloride 0.9 % 100 mL IVPB        3 g 200 mL/hr over 30 Minutes Intravenous Every 6 hours 06/13/22 1243 06/16/22 2150   06/12/22 0500  linezolid (ZYVOX) tablet 600 mg  Status:  Discontinued        600 mg Per Tube Every 12 hours 06/11/22 2301 06/13/22 1243   06/11/22 1400  piperacillin-tazobactam (ZOSYN) IVPB 3.375 g  Status:  Discontinued        3.375 g 12.5 mL/hr over 240 Minutes Intravenous Every 8 hours 06/11/22 1131 06/13/22 1243   06/11/22 1230  linezolid (ZYVOX) tablet 600 mg  Status:  Discontinued        600 mg Per Tube Every 12 hours 06/11/22 1131 06/11/22 2301   06/09/22 1100  cefTRIAXone (ROCEPHIN) 2 g in sodium chloride 0.9 % 100 mL IVPB  Status:  Discontinued        2 g 200 mL/hr over 30 Minutes Intravenous Every 24 hours 06/09/22 1006 06/11/22 1129   06/07/22 1100  azithromycin (ZITHROMAX) tablet 250 mg        250 mg Per Tube Daily 06/07/22 1006 06/11/22 0843       Significant Hospital Events: Including procedures, antibiotic start and stop dates in addition to other pertinent events   06/07/22: Admit to ICU with worsening acute respiratory  failure suspect secondary to acute new onset heart failure int he setting of suspected pulmonary hypertension & chronic COPD, OSA, asthma requiring emergent intubation and mechanical ventilatory support.  06/08/22- patient is s/p SBT with severe secretions, she did diurese >800cc today with lasix challenge, CXR with progressive pleural effusions. She has family coming in today. She's uisng OG and weve tranistioned to prednisone taper from solumedrol. Adding aldactone per tube. Iron workup today.  06/09/22-on minimal vent support, will attempt SBT.  Tracheal aspirate growing gram-positive cocci, ceftriaxone added 1/20 extubated and failed, re-intubated 1/21 severe COPD, remains on vent 1/22 severe COPD, remains on vent 1/23 severe upper airway swelling, ENT consulted 1/24 severe resp failure 1/26: Remains on vent, hemodynamically stable, tentative plan for Trach next Monday 1/29. 1/27: Remains on vent, no acute changes or events, awaiting Trach on Monday. D/C Solumedrol  1/28: Remains on vent, no acute changes.  BP soft, Lasix decreased to 40 mg daily. Low grade temperature overnight (T max 100), Obtain CT Chest 1/28: CT Chest>>Bilateral lower lobe consolidation, and right middle lobe       subsegmental atelectasis. No evidence of central endobronchial obstruction.       no evidence of pleural effusion. 2/01: CT Abd>>Portion of the transverse colon overlies the distal stomach.  Consider visualization of the transverse colon during percutaneous       gastrostomy tube placement. Persistent consolidation in both lower lobes.       Chronic left adrenal nodule. Minimal change since 2011 and likely a benign lesion.       Gallbladder distention without inflammatory changes. 2/02: Tolerated trach collar throughout the day  2/02: Speech therapy consulted for PMV trial, however pt did not tolerate PMV placement due to inability to redirect airflow superiorly for phonation and breathing.  Possibly will  require trach downsize when deemed appropriate 2/04: No acute events overnight.  Currently tolerating trach collar  2/06: No acute vents, tolerating TC during the day and resting on vent at night. Speech therapy/PT/OT following. ENT consulted pt pending downsize of trach on       02/12  06/30/22- patient improved slightly , plan for downsize trache collar.  She has defervesced post Cefepime course now planning for percutaneous PEG. We dcd non essential medications today. She's on trache collar off vent and she may qualify for CIR.  07/01/22- patient had NG clogged.  She will need that replaced to get nourishment.  She has not required NIV for last few days and is lucid awake with stable vital signs.  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  Pt tolerated TCT overnight   Objective   Blood pressure (!) 140/89, pulse 95, temperature 99 F (37.2 C), temperature source Oral, resp. rate (!) 22, height 4' 11"$  (1.499 m), weight 136 kg, last menstrual period 05/30/2022, SpO2 93 %.    FiO2 (%):  [28 %-35 %] 35 %   Intake/Output Summary (Last 24 hours) at 07/01/2022 1026 Last data filed at 07/01/2022 0800 Gross per 24 hour  Intake 472.49 ml  Output 1100 ml  Net -627.51 ml    Filed Weights   06/27/22 0500 06/28/22 0500 06/29/22 0431  Weight: 135 kg 135.6 kg 136 kg    Examination: General: Acute on chronically obese female, laying in bed, NAD on mechanical ventilation in SBT  HENT: Atraumatic, normocephalic, neck supple, difficult to assess JVD due to body habitus Lungs: Diminished throughout, even, non labored, size 8 xlt midline dry/ intact   Cardiovascular: NSR, s1s2, no m/r/g, 2+ radial/2+ distal pulses, trace bilateral lower extremity edema  Abdomen: +BS x4, obese, soft, non distended, non tender  Extremities: No deformities, normal bulk and tone, moves all extremities  Neuro: Awake and  following commands.  PERRLA GU: External catheter draining clear/yellow urine   Resolved Hospital Problem list   Acute Encephalopathy   Assessment & Plan:   #Acute hypoxic and hypercapnic respiratory failure in the setting of acute COPD exacerbation, new onset CHF, and pneumonia PMHx: OSA on CPAP, asthma, tobacco use - SBT/TCT as tolerated  - Maintain O2 sats 88 to 92% - Follow intermittent Chest X-ray & ABG as needed - Continue VAP Bundle - Bronchodilators a - Diuresis as renal function and blood pressure permits  #Decompensated CHF  #Elevated Troponin secondary to demand ischemia PMHx: HTN, HLD Patient reports orthopnea, dyspnea on exertion & BLE edema. CTa suggestive of Right heart dysfunction and pulmonary HTN  Echocardiogram 06/07/22: LVEF 60 to [id number], normal diastolic parameters, RV function normal - Continuous cardiac monitoring - Maintain MAP >65 - Diuresis as renal function and blood pressure permits~pt currently net positive continue lasix bid   #HAP~treated   - Trend WBC  and monitor fever curve  #Hypokalemia~improving   - Trend BMP - Replace electrolytes as indicated   #Diarrhea- RESOLVED - GI panel results pending   #Type II diabetes mellitus~difficult to control   - SSI, scheduled novolog, and semglee  - Diabetes coordinator consulted appreciate input    Best Practice (right click and "Reselect all SmartList Selections" daily)   Diet/type: tubefeeds; speech therapy consulted  DVT prophylaxis: Lovenox GI prophylaxis: Pepcid  Lines: PICC line, and is still needed Foley:  N/A Code Status:  full code Last date of multidisciplinary goals of care discussion [06/29/2022]  02/8: Updated pt regarding current plan of care and all questions answered  Labs   CBC: Recent Labs  Lab 06/27/22 0604 06/28/22 0600 06/29/22 0436 06/30/22 0709 07/01/22 0427  WBC 14.2* 13.3* 12.0* 10.6* 9.5  NEUTROABS  --   --  8.8* 7.4  --   HGB 10.2* 10.3* 10.7* 11.2* 11.0*  HCT  38.0 37.8 39.8 40.4 39.8  MCV 74.2* 74.3* 74.7* 74.5* 74.8*  PLT 392 387 381 380 364     Basic Metabolic Panel: Recent Labs  Lab 06/26/22 0347 06/27/22 0604 06/28/22 0600 06/29/22 0436 06/30/22 0709 07/01/22 0427  NA 142 142 142 141 137 136  K 3.9 3.8 3.6 3.5 4.0 3.4*  CL 104 99 102 98 95* 93*  CO2 29 31 32 33* 33* 34*  GLUCOSE 244* 232* 152* 176* 155* 117*  BUN 20 20 24* 31* 26* 23*  CREATININE 0.45 0.42* 0.38* 0.53 0.49 0.47  CALCIUM 8.5* 9.1 9.4 9.4 9.1 9.2  MG 2.1 2.2 2.2 2.3 2.3  --   PHOS 3.1 3.7 4.6 5.6* 5.4* 6.3*    GFR: Estimated Creatinine Clearance: 109 mL/min (by C-G formula based on SCr of 0.47 mg/dL). Recent Labs  Lab 06/28/22 0600 06/29/22 0436 06/30/22 0709 07/01/22 0427  WBC 13.3* 12.0* 10.6* 9.5     Liver Function Tests: Recent Labs  Lab 06/24/22 1647 06/25/22 0353 06/26/22 0347 07/01/22 0427  ALBUMIN 2.5* 2.4* 2.4* 2.9*    No results for input(s): "LIPASE", "AMYLASE" in the last 168 hours. No results for input(s): "AMMONIA" in the last 168 hours.  ABG    Component Value Date/Time   PHART 7.42 06/07/2022 0828   PCO2ART 60 (H) 06/07/2022 0828   PO2ART 68 (L) 06/07/2022 0828   HCO3 38.9 (H) 06/07/2022 0828   O2SAT 95.7 06/07/2022 0828     Coagulation Profile: No results for input(s): "INR", "PROTIME" in the last 168 hours.  Cardiac Enzymes: No results for input(s): "CKTOTAL", "CKMB", "CKMBINDEX", "TROPONINI" in the last 168 hours.  HbA1C: Hgb A1c MFr Bld  Date/Time Value Ref Range Status  06/07/2022 04:54 AM 9.0 (H) 4.8 - 5.6 % Final    Comment:    (NOTE) Pre diabetes:          5.7%-6.4%  Diabetes:              >6.4%  Glycemic control for   <7.0% adults with diabetes     CBG: Recent Labs  Lab 06/30/22 1554 06/30/22 1939 07/01/22 0003 07/01/22 0403 07/01/22 0804  GLUCAP 137* 95 113* 121* 134*     Review of Systems:   Unable to assess due to intubation and sedation  Past Medical History:  She,  has a past  medical history of Asthma, Diabetes mellitus without complication ([place]), and Hypertension.   Surgical History:   Past Surgical History:  Procedure Laterality Date   carpel tunnel  COLONOSCOPY WITH PROPOFOL N/A 12/08/2014   Procedure: COLONOSCOPY WITH PROPOFOL;  Surgeon: Lollie Sails, MD;  Location: [facility] ENDOSCOPY;  Service: Endoscopy;  Laterality: N/A;   ESOPHAGOGASTRODUODENOSCOPY     TRACHEOSTOMY TUBE PLACEMENT N/A 06/19/2022   Procedure: TRACHEOSTOMY;  Surgeon: Beverly Gust, MD;  Location: [facility] ORS;  Service: ENT;  Laterality: N/A;     Social History:   reports that she quit smoking about 5 years ago. Her smoking use included cigarettes. She has never used smokeless tobacco. She reports that she does not drink alcohol and does not use drugs.   Family History:  Her family history includes Breast cancer (age of onset: 21) in her paternal aunt; Cancer in her paternal grandmother.   Allergies Allergies  Allergen Reactions   Abilify [Aripiprazole] Other (See Comments)    Syncope   Effexor [Venlafaxine] Other (See Comments)    Hair Loss   Wellbutrin [Bupropion] Other (See Comments)    Acne      Home Medications  Prior to Admission medications   Medication Sig Start Date End Date Taking? Authorizing Provider  albuterol (PROVENTIL HFA;VENTOLIN HFA) 108 (90 BASE) MCG/ACT inhaler Inhale into the lungs every 6 (six) hours as needed for wheezing or shortness of breath.   Yes [provider]  atorvastatin (LIPITOR) 40 MG tablet Take 40 mg by mouth at bedtime. 05/06/20  Yes [provider]  cetirizine (ZYRTEC) 10 MG tablet Take 10 mg by mouth daily.   Yes [provider]  clindamycin (CLEOCIN T) 1 % external solution Apply 1 Application topically 2 (two) times daily.   Yes [provider]  cyanocobalamin 1000 MCG tablet Take 1,000 mcg by mouth daily.   Yes [provider]  enalapril (VASOTEC) 20 MG tablet Take 20 mg by mouth daily.    Yes [provider]  famotidine (PEPCID) 20 MG tablet Take 20 mg by mouth 2 (two) times daily.   Yes [provider]  fluticasone (FLOVENT HFA) 110 MCG/ACT inhaler Inhale 2 puffs into the lungs 2 (two) times daily.   Yes [provider]  hydrochlorothiazide (HYDRODIURIL) 25 MG tablet Take 25 mg by mouth daily.   Yes [provider]  hydrOXYzine (ATARAX) 25 MG tablet Take 25 mg by mouth 3 (three) times daily as needed.   Yes [provider]  ipratropium (ATROVENT HFA) 17 MCG/ACT inhaler Inhale 2 puffs into the lungs every 4 (four) hours as needed for wheezing.   Yes [provider]  liraglutide (VICTOZA) 18 MG/3ML SOPN Inject 2.4 mg into the skin daily.   Yes [provider]  metFORMIN (GLUCOPHAGE-XR) 500 MG 24 hr tablet Take 1,000 mg by mouth 2 (two) times daily with a meal.   Yes [provider]     Critical care time: 30 minutes     Donell Beers, [place] Pager [phone] (please enter 7 digits) [place] Pager [phone] (please enter 7 digits)       ICU ATTENDING ATTESTATION:  Patient seen and examined and relevant ancillary tests reviewed.   I agree with the assessment and plan of care as outlined by Donell Beers NP.    BP (!) 140/89 (BP Location: Right Arm)   Pulse 95   Temp 99 F (37.2 C) (Oral)   Resp (!) 22   Ht 4' 11"$  (1.499 m)   Wt 136 kg   LMP 05/30/2022   SpO2 93%   BMI 60.56 kg/m   CBC    Component Value  Date/Time   WBC 9.5 07/01/2022 0427   RBC 5.32 (H) 07/01/2022 0427   HGB 11.0 (L) 07/01/2022 0427   HGB 12.6 05/13/2020 1534   HCT 39.8 07/01/2022 0427   HCT 41.7 05/13/2020 1534   PLT 364 07/01/2022 0427   PLT 297 05/13/2020 1534   MCV 74.8 (L) 07/01/2022 0427   MCV 71 (L) 05/13/2020 1534   MCH 20.7 (L) 07/01/2022 0427   MCHC 27.6 (L) 07/01/2022 0427   RDW 27.0 (H) 07/01/2022 0427   RDW 18.1 (H) 05/13/2020 1534   LYMPHSABS 2.3 06/30/2022 0709    LYMPHSABS 3.8 (H) 05/13/2020 1534   MONOABS 0.6 06/30/2022 0709   EOSABS 0.2 06/30/2022 0709   EOSABS 0.1 05/13/2020 1534   BASOSABS 0.0 06/30/2022 0709   BASOSABS 0.1 05/13/2020 1534        Latest Ref Rng & Units 07/01/2022    4:27 AM 06/30/2022    7:09 AM 06/29/2022    4:36 AM  BMP  Glucose 70 - 99 mg/dL 117  155  176   BUN 6 - 20 mg/dL 23  26  31   $ Creatinine 0.44 - 1.00 mg/dL 0.47  0.49  0.53   Sodium 135 - 145 mmol/L 136  137  141   Potassium 3.5 - 5.1 mmol/L 3.4  4.0  3.5   Chloride 98 - 111 mmol/L 93  95  98   CO2 22 - 32 mmol/L 34  33  33   Calcium 8.9 - 10.3 mg/dL 9.2  9.1  9.4      Critical care provider statement:   Total critical care time: 33 minutes   Performed by: Lanney Gins MD   Critical care time was exclusive of separately billable procedures and treating other patients.   Critical care was necessary to treat or prevent imminent or life-threatening deterioration.   Critical care was time spent personally by me on the following activities: development of treatment plan with patient and/or surrogate as well as nursing, discussions with consultants, evaluation of patient's response to treatment, examination of patient, obtaining history from patient or surrogate, ordering and performing treatments and interventions, ordering and review of laboratory studies, ordering and review of radiographic studies, pulse oximetry and re-evaluation of patient's condition.    Ottie Glazier, M.D.  Pulmonary & Critical Care Medicine

## 2022-07-02 ENCOUNTER — Inpatient Hospital Stay: Payer: Medicaid Other

## 2022-07-02 DIAGNOSIS — J9602 Acute respiratory failure with hypercapnia: Secondary | ICD-10-CM | POA: Diagnosis not present

## 2022-07-02 DIAGNOSIS — J9601 Acute respiratory failure with hypoxia: Secondary | ICD-10-CM | POA: Diagnosis not present

## 2022-07-02 LAB — RENAL FUNCTION PANEL
Albumin: 3.1 g/dL — ABNORMAL LOW (ref 3.5–5.0)
Anion gap: 9 (ref 5–15)
BUN: 24 mg/dL — ABNORMAL HIGH (ref 6–20)
CO2: 31 mmol/L (ref 22–32)
Calcium: 9 mg/dL (ref 8.9–10.3)
Chloride: 98 mmol/L (ref 98–111)
Creatinine, Ser: 0.54 mg/dL (ref 0.44–1.00)
GFR, Estimated: >60 mL/min (ref >60)
Glucose, Bld: 151 mg/dL — ABNORMAL HIGH (ref 70–99)
Phosphorus: 5.4 mg/dL — ABNORMAL HIGH (ref 2.5–4.6)
Potassium: 3.7 mmol/L (ref 3.5–5.1)
Sodium: 138 mmol/L (ref 135–145)

## 2022-07-02 LAB — GLUCOSE, CAPILLARY
Glucose-Capillary: 182 mg/dL — ABNORMAL HIGH (ref 70–99)
Glucose-Capillary: 141 mg/dL — ABNORMAL HIGH (ref 70–99)
Glucose-Capillary: 160 mg/dL — ABNORMAL HIGH (ref 70–99)
Glucose-Capillary: 137 mg/dL — ABNORMAL HIGH (ref 70–99)
Glucose-Capillary: 145 mg/dL — ABNORMAL HIGH (ref 70–99)

## 2022-07-02 LAB — CBC
HCT: 40.5 % (ref 36.0–46.0)
Hemoglobin: 11.1 g/dL — ABNORMAL LOW (ref 12.0–15.0)
MCH: 20.7 pg — ABNORMAL LOW (ref 26.0–34.0)
MCHC: 27.4 g/dL — ABNORMAL LOW (ref 30.0–36.0)
MCV: 75.6 fL — ABNORMAL LOW (ref 80.0–100.0)
Platelets: 359 10*3/uL (ref 150–400)
RBC: 5.36 MIL/uL — ABNORMAL HIGH (ref 3.87–5.11)
RDW: 27.3 % — ABNORMAL HIGH (ref 11.5–15.5)
WBC: 11.1 10*3/uL — ABNORMAL HIGH (ref 4.0–10.5)
nRBC: 0 % (ref 0.0–0.2)

## 2022-07-02 MED ORDER — POTASSIUM CHLORIDE 20 MEQ PO PACK
20.0000 meq | PACK | Freq: Once | ORAL | Status: AC
  Administered 2022-07-02: 20 meq via NASOGASTRIC
  Filled 2022-07-02: qty 1

## 2022-07-02 MED ORDER — LOPERAMIDE HCL 1 MG/7.5ML PO SUSP: 4.0000 mg | ORAL | Status: DC | PRN

## 2022-07-02 MED ORDER — LOPERAMIDE HCL 2 MG PO CAPS: 4.0000 mg | ORAL_CAPSULE | ORAL | Status: DC | PRN

## 2022-07-02 NOTE — Progress Notes (Signed)
PROGRESS NOTE    Desiree Stone  [id number] DOB: May 21, 1976 DOA: 06/06/2022 PCP: [facility]    Brief Narrative:  47 year old female admitted with acute hypoxic and hypercapnic respiratory failure in the setting of acute COPD exacerbation, new onset CHF, and suspected community-acquired pneumonia requiring intubation and mechanical ventilation.   Per chart review patient endorsed increasing dyspnea over the past few months.  Worsening acutely in a couple days before admission.  No history of outpatient oxygen use.  Patient was an active cigarette smoker.  On admission to the emergency department ABG with severe respiratory acidosis.  Initially placed on BiPAP.  Follow-up ABGs with persistent respiratory acidosis in the setting adjustment.  Decision made to urgently intubate patient and placed on mechanical ventilation Elder Love support.  Patient was initially admitted to ICU on 1/17.  Remained mechanically ventilated until Monday 1/29.  Tracheostomy placed at that time.  Patient remained on vent for extended period of time and was gradually weaned off.  Patient developed developed fever that was treated with a course of intravenous cefepime.  She is on trach collar off the ventilator.  May qualify for CIR.  Had NG tube in place, that became clogged on 2/10.  Replaced and placement confirmed by imaging.  Plan for percutaneous PEG placement and potential placement at CIR.   Assessment & Plan:   Principal Problem:   Acute respiratory failure with hypoxia and hypercapnia (HCC) Active Problems:   Obstructive sleep apnea   Type 2 diabetes mellitus (HCC)   Asthma   Essential hypertension   Acute respiratory failure (HCC)  #Acute hypoxic and hypercapnic respiratory failure in the setting of acute COPD exacerbation, new onset CHF, and pneumonia PMHx: OSA on CPAP, asthma, tobacco use Patient no longer ventilator dependent.  Tracheostomy in place. Will need PEG placement.  IR  consult placed    #Decompensated CHF  #Elevated Troponin secondary to demand ischemia PMHx: HTN, HLD Patient reports orthopnea, dyspnea on exertion & BLE edema. CTa suggestive of Right heart dysfunction and pulmonary HTN  Echocardiogram 06/07/22: LVEF 60 to [id number], normal diastolic parameters, RV function normal - Continuous cardiac monitoring - Maintain MAP >65 - Diuresis as renal function and blood pressure permits~pt currently net positive continue lasix bid    #HAP~treated   - Trend WBC and monitor fever curve -No further antibiotics at this time   #Hypokalemia~improving   - Trend BMP - Replace electrolytes as indicated    #Diarrhea- RESOLVED -No signs of infectious diarrhea   #Type II diabetes mellitus~difficult to control   - SSI, scheduled novolog, and semglee  - Diabetes coordinator consulted appreciate input    DVT prophylaxis: SQ Lovenox Code Status: Full Family Communication: None today Disposition Plan: Status is: Inpatient Remains inpatient appropriate because: ICU downgrade.  Status post tracheostomy.  Needs PEG placement.  At that time should be medically stable for discharge.   Level of care: Progressive  Consultants:  IR  Procedures:  Tracheostomy  Antimicrobials: None   Subjective: Seen and examined.  Mentating clearly.  Answers all questions appropriately.  No pain complaints.  Objective: Vitals:   07/02/22 0356 07/02/22 0820 07/02/22 0826 07/02/22 0839  BP: 133/83  116/70   Pulse: 94  91 90  Resp: 18  18 19  $ Temp: 98 F (36.7 C)  98.1 F (36.7 C)   TempSrc:      SpO2: 91% 93% 96% 95%  Weight:      Height:        Intake/Output  Summary (Last 24 hours) at 07/02/2022 1123 Last data filed at 07/02/2022 0700 Gross per 24 hour  Intake 4626.43 ml  Output 800 ml  Net 3826.43 ml   Filed Weights   06/27/22 0500 06/28/22 0500 06/29/22 0431  Weight: 135 kg 135.6 kg 136 kg    Examination:  General exam: NAD.  Appears chronically  ill Respiratory system: Status post tracheostomy.  Coarse breath sounds bilaterally.  Normal work of breathing.  Trach collar Cardiovascular system: S1-S2, RRR, no murmurs, no pedal edema Gastrointestinal system: Obese, soft, NT/ND, normal bowel sounds Central nervous system: Alert and oriented. No focal neurological deficits. Extremities: Symmetric 5 x 5 power. Skin: No rashes, lesions or ulcers Psychiatry: Judgement and insight appear normal. Mood & affect appropriate.     Data Reviewed: I have personally reviewed following labs and imaging studies  CBC: Recent Labs  Lab 06/28/22 0600 06/29/22 0436 06/30/22 0709 07/01/22 0427 07/02/22 0606  WBC 13.3* 12.0* 10.6* 9.5 11.1*  NEUTROABS  --  8.8* 7.4  --   --   HGB 10.3* 10.7* 11.2* 11.0* 11.1*  HCT 37.8 39.8 40.4 39.8 40.5  MCV 74.3* 74.7* 74.5* 74.8* 75.6*  PLT 387 381 380 364 [id number]   Basic Metabolic Panel: Recent Labs  Lab 06/26/22 0347 06/27/22 0604 06/28/22 0600 06/29/22 0436 06/30/22 0709 07/01/22 0427 07/02/22 0606  NA 142 142 142 141 137 136 138  K 3.9 3.8 3.6 3.5 4.0 3.4* 3.7  CL 104 99 102 98 95* 93* 98  CO2 29 31 32 33* 33* 34* 31  GLUCOSE 244* 232* 152* 176* 155* 117* 151*  BUN 20 20 24* 31* 26* 23* 24*  CREATININE 0.45 0.42* 0.38* 0.53 0.49 0.47 0.54  CALCIUM 8.5* 9.1 9.4 9.4 9.1 9.2 9.0  MG 2.1 2.2 2.2 2.3 2.3  --   --   PHOS 3.1 3.7 4.6 5.6* 5.4* 6.3* 5.4*   GFR: Estimated Creatinine Clearance: 109 mL/min (by C-G formula based on SCr of 0.54 mg/dL). Liver Function Tests: Recent Labs  Lab 06/26/22 0347 07/01/22 0427 07/02/22 0606  ALBUMIN 2.4* 2.9* 3.1*   No results for input(s): "LIPASE", "AMYLASE" in the last 168 hours. No results for input(s): "AMMONIA" in the last 168 hours. Coagulation Profile: No results for input(s): "INR", "PROTIME" in the last 168 hours. Cardiac Enzymes: No results for input(s): "CKTOTAL", "CKMB", "CKMBINDEX", "TROPONINI" in the last 168 hours. BNP (last 3  results) No results for input(s): "PROBNP" in the last 8760 hours. HbA1C: No results for input(s): "HGBA1C" in the last 72 hours. CBG: Recent Labs  Lab 07/01/22 1644 07/01/22 2023 07/01/22 2345 07/02/22 0353 07/02/22 0827  GLUCAP 131* 145* 146* 182* 141*   Lipid Profile: No results for input(s): "CHOL", "HDL", "LDLCALC", "TRIG", "CHOLHDL", "LDLDIRECT" in the last 72 hours. Thyroid Function Tests: No results for input(s): "TSH", "T4TOTAL", "FREET4", "T3FREE", "THYROIDAB" in the last 72 hours. Anemia Panel: Recent Labs    06/30/22 0709  VITAMINB12 513  FOLATE 17.9  FERRITIN 215  TIBC 248*  IRON 49   Sepsis Labs: No results for input(s): "PROCALCITON", "LATICACIDVEN" in the last 168 hours.  No results found for this or any previous visit (from the past 240 hour(s)).       Radiology Studies: DG Chest Port 1 View  Result Date: 07/02/2022 CLINICAL DATA:  NG tube placement. EXAM: PORTABLE CHEST 1 VIEW COMPARISON:  06/22/2022 FINDINGS: 0305 hours. NG tube is looped in the stomach with the tip overlying the region of the gastric cardia.  Proximal side port is well below the GE junction. Endotracheal tube tip projects 3.5 cm above the base of the carina. Low volume lordotic film. The cardio pericardial silhouette is enlarged. Bibasilar atelectasis noted. IMPRESSION: NG tube is looped in the stomach with the tip overlying the region of the gastric cardia. Electronically Signed   By: Misty Stanley M.D.   On: 07/02/2022 07:32   DG Abd 1 View  Result Date: 07/01/2022 CLINICAL DATA:  NG tube placement. EXAM: ABDOMEN - 1 VIEW COMPARISON:  06/16/2022. FINDINGS: Nasal/orogastric tube passes below the diaphragm, well into the stomach. Normal visualized bowel gas pattern. IMPRESSION: 1. Well-positioned nasal/orogastric tube. Electronically Signed   By: Lajean Manes M.D.   On: 07/01/2022 13:31        Scheduled Meds:  atorvastatin  40 mg Per Tube QHS   Chlorhexidine Gluconate Cloth  6  each Topical Q0600   enoxaparin (LOVENOX) injection  0.5 mg/kg Subcutaneous Q24H   free water  50 mL Per Tube Q4H   furosemide  40 mg Intravenous BID   insulin aspart  0-9 Units Subcutaneous Q4H   insulin aspart  7 Units Subcutaneous Q4H   insulin glargine-yfgn  30 Units Subcutaneous BID   ipratropium-albuterol  3 mL Nebulization Q6H   sertraline  50 mg Per Tube QHS   sodium chloride flush  3 mL Intravenous Q12H   Continuous Infusions:  sodium chloride Stopped (06/30/22 1100)   famotidine (PEPCID) IV 20 mg (07/02/22 1037)   feeding supplement (GLUCERNA 1.5 CAL) Stopped (06/30/22 1500)     LOS: 25 days  \ Sidney Ace, MD [facility]   If 7PM-7AM, please contact night-coverage  07/02/2022, 11:23 AM

## 2022-07-02 NOTE — Progress Notes (Addendum)
PT notified that PT NG tube was dislodged partially. Tube feeds stopped at this time to notify on call provider for orders. No acute distress to PT. Pt transferred from the bed to [facility] for BM.   NG tube noted at 65cm currently from 57cm at start of shift tonight. Xray completed for placement.

## 2022-07-02 NOTE — Consult Note (Signed)
PHARMACY CONSULT NOTE - ELECTROLYTES  Pharmacy Consult for Electrolyte Monitoring and Replacement   Recent Labs: Potassium (mmol/L)  Date Value  07/02/2022 3.7   Magnesium (mg/dL)  Date Value  06/30/2022 2.3   Calcium (mg/dL)  Date Value  07/02/2022 9.0   Albumin (g/dL)  Date Value  07/02/2022 3.1 (L)   Phosphorus (mg/dL)  Date Value  07/02/2022 5.4 (H)   Sodium (mmol/L)  Date Value  07/02/2022 138   Assessment 47 y.o. female presenting with AHRF. PMH significant for asthma, T2DM, HTN, OSA, tobacco use (32 pack-years), obesity. Pharmacy has been consulted to monitor and replace electrolytes while in CCU.  Diet/feeding: Glucerna 1.5@65ml$ /hr + FWF 34m q4h  Pertinent medications:  furosemide IV 40 mg BID  Goal of Therapy: Electrolytes WNL  Plan:  - Kcl 20 mEq x 1 via tube. Pt transferred to med floor. Pharmacy will sign off. Please re-consult if needed.   - recheck electrolytes in am  Thank you for allowing pharmacy to be a part of this patient's care.  KOswald Hillock PharmD, BCPS Clinical Pharmacist   07/02/2022 7:04 AM

## 2022-07-03 ENCOUNTER — Inpatient Hospital Stay: Payer: Medicaid Other

## 2022-07-03 DIAGNOSIS — J9601 Acute respiratory failure with hypoxia: Secondary | ICD-10-CM | POA: Diagnosis not present

## 2022-07-03 DIAGNOSIS — J9602 Acute respiratory failure with hypercapnia: Secondary | ICD-10-CM | POA: Diagnosis not present

## 2022-07-03 LAB — GLUCOSE, CAPILLARY
Glucose-Capillary: 102 mg/dL — ABNORMAL HIGH (ref 70–99)
Glucose-Capillary: 123 mg/dL — ABNORMAL HIGH (ref 70–99)
Glucose-Capillary: 135 mg/dL — ABNORMAL HIGH (ref 70–99)
Glucose-Capillary: 161 mg/dL — ABNORMAL HIGH (ref 70–99)
Glucose-Capillary: 168 mg/dL — ABNORMAL HIGH (ref 70–99)
Glucose-Capillary: 173 mg/dL — ABNORMAL HIGH (ref 70–99)
Glucose-Capillary: 190 mg/dL — ABNORMAL HIGH (ref 70–99)

## 2022-07-03 LAB — RENAL FUNCTION PANEL
Albumin: 3.1 g/dL — ABNORMAL LOW (ref 3.5–5.0)
Anion gap: 10 (ref 5–15)
BUN: 24 mg/dL — ABNORMAL HIGH (ref 6–20)
CO2: 30 mmol/L (ref 22–32)
Calcium: 9.1 mg/dL (ref 8.9–10.3)
Chloride: 99 mmol/L (ref 98–111)
Creatinine, Ser: 0.5 mg/dL (ref 0.44–1.00)
GFR, Estimated: >60 mL/min (ref >60)
Glucose, Bld: 167 mg/dL — ABNORMAL HIGH (ref 70–99)
Phosphorus: 4.7 mg/dL — ABNORMAL HIGH (ref 2.5–4.6)
Potassium: 3.4 mmol/L — ABNORMAL LOW (ref 3.5–5.1)
Sodium: 139 mmol/L (ref 135–145)

## 2022-07-03 LAB — CBC
HCT: 41.3 % (ref 36.0–46.0)
Hemoglobin: 11.2 g/dL — ABNORMAL LOW (ref 12.0–15.0)
MCH: 20.8 pg — ABNORMAL LOW (ref 26.0–34.0)
MCHC: 27.1 g/dL — ABNORMAL LOW (ref 30.0–36.0)
MCV: 76.6 fL — ABNORMAL LOW (ref 80.0–100.0)
Platelets: 333 10*3/uL (ref 150–400)
RBC: 5.39 MIL/uL — ABNORMAL HIGH (ref 3.87–5.11)
RDW: 27.2 % — ABNORMAL HIGH (ref 11.5–15.5)
WBC: 12.7 10*3/uL — ABNORMAL HIGH (ref 4.0–10.5)
nRBC: 0 % (ref 0.0–0.2)

## 2022-07-03 MED ORDER — MELATONIN 5 MG PO TABS
5.0000 mg | ORAL_TABLET | Freq: Once | ORAL | Status: DC
  Filled 2022-07-03: qty 1

## 2022-07-03 MED ORDER — POTASSIUM CHLORIDE 20 MEQ PO PACK
40.0000 meq | PACK | Freq: Once | ORAL | Status: AC
  Administered 2022-07-03: 40 meq
  Filled 2022-07-03: qty 2

## 2022-07-03 MED ORDER — MELATONIN 5 MG PO TABS
5.0000 mg | ORAL_TABLET | Freq: Once | ORAL | Status: AC
  Administered 2022-07-03: 5 mg

## 2022-07-03 NOTE — Progress Notes (Signed)
Inpatient Rehab Admissions Coordinator:   Attempted to reach pt's room x2 today to discuss cost of care.  No answer and per documentation does not appear trach has been downsized yet.  Will continue to follow.   Shann Medal, PT, DPT Admissions Coordinator [phone] 07/03/22  3:58 PM

## 2022-07-03 NOTE — Progress Notes (Signed)
       CROSS COVER NOTE  NAME: MACKENSI MAHADEO MRN: [id number] DOB : [birth year] ATTENDING PHYSICIAN: Sidney Ace, MD    Date of Service   07/03/2022   HPI/Events of Note   Medication request received for sleep aid.  Interventions   Assessment/Plan:  Melatonin      To reach the provider On-Call:   7AM- 7PM see care teams to locate the attending and reach out to them via [email or web address]: [email or web address] 7PM-7AM contact night-coverage If you still have difficulty reaching the appropriate provider, please page the [facility] (Director on Call) for [facility] on amion for assistance  This document was prepared using Systems analyst and may include unintentional dictation errors.  Neomia Glass DNP, MBA, FNP-BC, PMHNP-BC Nurse Practitioner [facility] Pager [phone]

## 2022-07-03 NOTE — Progress Notes (Signed)
PROGRESS NOTE    Desiree Stone  [id number] DOB: 03-Mar-1976 DOA: 06/06/2022 PCP: [facility]    Brief Narrative:  47 year old female admitted with acute hypoxic and hypercapnic respiratory failure in the setting of acute COPD exacerbation, new onset CHF, and suspected community-acquired pneumonia requiring intubation and mechanical ventilation.   Per chart review patient endorsed increasing dyspnea over the past few months.  Worsening acutely in a couple days before admission.  No history of outpatient oxygen use.  Patient was an active cigarette smoker.  On admission to the emergency department ABG with severe respiratory acidosis.  Initially placed on BiPAP.  Follow-up ABGs with persistent respiratory acidosis in the setting adjustment.  Decision made to urgently intubate patient and placed on mechanical ventilation Elder Love support.  Patient was initially admitted to ICU on 1/17.  Remained mechanically ventilated until Monday 1/29.  Tracheostomy placed at that time.  Patient remained on vent for extended period of time and was gradually weaned off.  Patient developed developed fever that was treated with a course of intravenous cefepime.  She is on trach collar off the ventilator.  May qualify for CIR.  Had NG tube in place, that became clogged on 2/10.  Replaced and placement confirmed by imaging.    2/12: No clinical status changes.  Per documentation there plans for trach downsizing.  I have reached out to ENT   Assessment & Plan:   Principal Problem:   Acute respiratory failure with hypoxia and hypercapnia (HCC) Active Problems:   Obstructive sleep apnea   Type 2 diabetes mellitus (HCC)   Asthma   Essential hypertension   Acute respiratory failure (HCC)  #Acute hypoxic and hypercapnic respiratory failure in the setting of acute COPD exacerbation, new onset CHF, and pneumonia PMHx: OSA on CPAP, asthma, tobacco use Patient no longer ventilator dependent.   Tracheostomy in place. ENT contacted for possible trach downsizing.  If trach successfully downsized we can work on Passy-Muir valve and initiation of p.o. diet    #Decompensated CHF  #Elevated Troponin secondary to demand ischemia PMHx: HTN, HLD Patient reports orthopnea, dyspnea on exertion & BLE edema. CTa suggestive of Right heart dysfunction and pulmonary HTN  Echocardiogram 06/07/22: LVEF 60 to 123456, normal diastolic parameters, RV function normal - Continuous cardiac monitoring - Maintain MAP >65 - Diuresis as renal function and blood pressure permits~pt currently net positive continue lasix bid    #HAP~treated   - Trend WBC and monitor fever curve -No further antibiotics at this time   #Hypokalemia~improving   - Trend BMP - Replace electrolytes as indicated    #Diarrhea- RESOLVED -No signs of infectious diarrhea   #Type II diabetes mellitus~difficult to control   - SSI, scheduled novolog, and semglee  - Diabetes coordinator consulted appreciate input    DVT prophylaxis: SQ Lovenox Code Status: Full Family Communication: None today Disposition Plan: Status is: Inpatient Remains inpatient appropriate because: ICU downgrade.  Status post tracheostomy.  Needs trach downsized and Passy-Muir valve and p.o. intake   Level of care: Progressive  Consultants:  IR  Procedures:  Tracheostomy  Antimicrobials: None   Subjective: Seen and examined.  Mentating clearly.  Answers all questions appropriately.  Endorses pain. Left lower quadrant Objective: Vitals:   07/03/22 0438 07/03/22 0752 07/03/22 0823 07/03/22 0849  BP:   124/81   Pulse:   (!) 101   Resp:      Temp:   98.5 F (36.9 C)   TempSrc:   Oral  SpO2: 94% 96% 100%   Weight:    126.8 kg  Height:        Intake/Output Summary (Last 24 hours) at 07/03/2022 1214 Last data filed at 07/03/2022 0800 Gross per 24 hour  Intake 56.8 ml  Output 400 ml  Net -343.2 ml   Filed Weights   06/28/22 0500 06/29/22  0431 07/03/22 0849  Weight: 135.6 kg 136 kg 126.8 kg    Examination:  General exam: No acute distress Respiratory system: Status post tracheostomy.  Coarse breath sounds bilaterally.  Normal work of breathing.  Trach collar Cardiovascular system: S1-S2, RRR, no murmurs, no pedal edema Gastrointestinal system: Obese, soft, NT/ND, normal bowel sounds, painful firm swelling in left lower quadrant Central nervous system: Alert and oriented. No focal neurological deficits. Extremities: Symmetric 5 x 5 power. Skin: No rashes, lesions or ulcers Psychiatry: Judgement and insight appear normal. Mood & affect appropriate.     Data Reviewed: I have personally reviewed following labs and imaging studies  CBC: Recent Labs  Lab 06/29/22 0436 06/30/22 0709 07/01/22 0427 07/02/22 0606 07/03/22 0354  WBC 12.0* 10.6* 9.5 11.1* 12.7*  NEUTROABS 8.8* 7.4  --   --   --   HGB 10.7* 11.2* 11.0* 11.1* 11.2*  HCT 39.8 40.4 39.8 40.5 41.3  MCV 74.7* 74.5* 74.8* 75.6* 76.6*  PLT 381 380 364 359 0000000   Basic Metabolic Panel: Recent Labs  Lab 06/27/22 0604 06/28/22 0600 06/29/22 0436 06/30/22 0709 07/01/22 0427 07/02/22 0606 07/03/22 0354  NA 142 142 141 137 136 138 139  K 3.8 3.6 3.5 4.0 3.4* 3.7 3.4*  CL 99 102 98 95* 93* 98 99  CO2 31 32 33* 33* 34* 31 30  GLUCOSE 232* 152* 176* 155* 117* 151* 167*  BUN 20 24* 31* 26* 23* 24* 24*  CREATININE 0.42* 0.38* 0.53 0.49 0.47 0.54 0.50  CALCIUM 9.1 9.4 9.4 9.1 9.2 9.0 9.1  MG 2.2 2.2 2.3 2.3  --   --   --   PHOS 3.7 4.6 5.6* 5.4* 6.3* 5.4* 4.7*   GFR: Estimated Creatinine Clearance: 104 mL/min (by C-G formula based on SCr of 0.5 mg/dL). Liver Function Tests: Recent Labs  Lab 07/01/22 0427 07/02/22 0606 07/03/22 0354  ALBUMIN 2.9* 3.1* 3.1*   No results for input(s): "LIPASE", "AMYLASE" in the last 168 hours. No results for input(s): "AMMONIA" in the last 168 hours. Coagulation Profile: No results for input(s): "INR", "PROTIME" in the  last 168 hours. Cardiac Enzymes: No results for input(s): "CKTOTAL", "CKMB", "CKMBINDEX", "TROPONINI" in the last 168 hours. BNP (last 3 results) No results for input(s): "PROBNP" in the last 8760 hours. HbA1C: No results for input(s): "HGBA1C" in the last 72 hours. CBG: Recent Labs  Lab 07/02/22 2105 07/03/22 0039 07/03/22 0511 07/03/22 0825 07/03/22 0906  GLUCAP 145* 102* 190* 161* 173*   Lipid Profile: No results for input(s): "CHOL", "HDL", "LDLCALC", "TRIG", "CHOLHDL", "LDLDIRECT" in the last 72 hours. Thyroid Function Tests: No results for input(s): "TSH", "T4TOTAL", "FREET4", "T3FREE", "THYROIDAB" in the last 72 hours. Anemia Panel: No results for input(s): "VITAMINB12", "FOLATE", "FERRITIN", "TIBC", "IRON", "RETICCTPCT" in the last 72 hours.  Sepsis Labs: No results for input(s): "PROCALCITON", "LATICACIDVEN" in the last 168 hours.  No results found for this or any previous visit (from the past 240 hour(s)).       Radiology Studies: DG Chest Port 1 View  Result Date: 07/02/2022 CLINICAL DATA:  NG tube placement. EXAM: PORTABLE CHEST 1 VIEW COMPARISON:  06/22/2022 FINDINGS: 0305 hours. NG tube is looped in the stomach with the tip overlying the region of the gastric cardia. Proximal side port is well below the GE junction. Endotracheal tube tip projects 3.5 cm above the base of the carina. Low volume lordotic film. The cardio pericardial silhouette is enlarged. Bibasilar atelectasis noted. IMPRESSION: NG tube is looped in the stomach with the tip overlying the region of the gastric cardia. Electronically Signed   By: Misty Stanley M.D.   On: 07/02/2022 07:32   DG Abd 1 View  Result Date: 07/01/2022 CLINICAL DATA:  NG tube placement. EXAM: ABDOMEN - 1 VIEW COMPARISON:  06/16/2022. FINDINGS: Nasal/orogastric tube passes below the diaphragm, well into the stomach. Normal visualized bowel gas pattern. IMPRESSION: 1. Well-positioned nasal/orogastric tube. Electronically  Signed   By: Lajean Manes M.D.   On: 07/01/2022 13:31        Scheduled Meds:  atorvastatin  40 mg Per Tube QHS   Chlorhexidine Gluconate Cloth  6 each Topical Q0600   enoxaparin (LOVENOX) injection  0.5 mg/kg Subcutaneous Q24H   free water  50 mL Per Tube Q4H   furosemide  40 mg Intravenous BID   insulin aspart  0-9 Units Subcutaneous Q4H   insulin aspart  7 Units Subcutaneous Q4H   insulin glargine-yfgn  30 Units Subcutaneous BID   ipratropium-albuterol  3 mL Nebulization Q6H   sertraline  50 mg Per Tube QHS   sodium chloride flush  3 mL Intravenous Q12H   Continuous Infusions:  sodium chloride Stopped (06/30/22 1100)   famotidine (PEPCID) IV 20 mg (07/03/22 0842)   feeding supplement (GLUCERNA 1.5 CAL) Stopped (06/30/22 1500)     LOS: 26 days  \ Sidney Ace, MD [facility]   If 7PM-7AM, please contact night-coverage  07/03/2022, 12:14 PM

## 2022-07-03 NOTE — Progress Notes (Signed)
SLP Cancellation Note  Patient Details Name: Desiree Stone MRN: [id number] DOB: August 09, 1975   Cancelled treatment:       Reason Eval/Treat Not Completed: Patient not medically ready (Pt's trach has not been downsized yet. Will continue to f/u.)  [name], M.S., [facility] [phone] ([place])   [name] 07/03/2022, 3:10 PM

## 2022-07-03 NOTE — Plan of Care (Signed)
  Problem: Education: Goal: Ability to describe self-care measures that may prevent or decrease complications (Diabetes Survival Skills Education) will improve Outcome: Progressing Goal: Individualized Educational Video(s) Outcome: Progressing   Problem: Coping: Goal: Ability to adjust to condition or change in health will improve Outcome: Progressing   Problem: Fluid Volume: Goal: Ability to maintain a balanced intake and output will improve Outcome: Progressing   Problem: Health Behavior/Discharge Planning: Goal: Ability to identify and utilize available resources and services will improve Outcome: Progressing Goal: Ability to manage health-related needs will improve Outcome: Progressing   Problem: Coping: Goal: Ability to adjust to condition or change in health will improve Outcome: Progressing   Problem: Fluid Volume: Goal: Ability to maintain a balanced intake and output will improve Outcome: Progressing   Problem: Health Behavior/Discharge Planning: Goal: Ability to identify and utilize available resources and services will improve Outcome: Progressing Goal: Ability to manage health-related needs will improve Outcome: Progressing   Problem: Health Behavior/Discharge Planning: Goal: Ability to identify and utilize available resources and services will improve Outcome: Progressing Goal: Ability to manage health-related needs will improve Outcome: Progressing   Problem: Nutritional: Goal: Maintenance of adequate nutrition will improve Outcome: Progressing Goal: Progress toward achieving an optimal weight will improve Outcome: Progressing   Problem: Skin Integrity: Goal: Risk for impaired skin integrity will decrease Outcome: Progressing   Problem: Tissue Perfusion: Goal: Adequacy of tissue perfusion will improve Outcome: Progressing   Problem: Education: Goal: Knowledge of General Education information will improve Description: Including pain rating scale,  medication(s)/side effects and non-pharmacologic comfort measures Outcome: Progressing   Problem: Health Behavior/Discharge Planning: Goal: Ability to manage health-related needs will improve Outcome: Progressing   Problem: Clinical Measurements: Goal: Ability to maintain clinical measurements within normal limits will improve Outcome: Progressing Goal: Will remain free from infection Outcome: Progressing Goal: Diagnostic test results will improve Outcome: Progressing Goal: Respiratory complications will improve Outcome: Progressing Goal: Cardiovascular complication will be avoided Outcome: Progressing   Problem: Activity: Goal: Risk for activity intolerance will decrease Outcome: Progressing   Problem: Nutrition: Goal: Adequate nutrition will be maintained Outcome: Progressing   Problem: Coping: Goal: Level of anxiety will decrease Outcome: Progressing   Problem: Elimination: Goal: Will not experience complications related to bowel motility Outcome: Progressing Goal: Will not experience complications related to urinary retention Outcome: Progressing   Problem: Pain Managment: Goal: General experience of comfort will improve Outcome: Progressing   Problem: Safety: Goal: Ability to remain free from injury will improve Outcome: Progressing   Problem: Skin Integrity: Goal: Risk for impaired skin integrity will decrease Outcome: Progressing

## 2022-07-03 NOTE — Progress Notes (Signed)
Occupational Therapy Treatment Patient Details Name: Desiree Stone MRN: [id number] DOB: Oct 06, 1975 Today's Date: 07/03/2022   History of present illness Patient is a 47 year old female with acute hypoxic and hypercapnic respiratory failure, acute COPD exacerbation, new onset CHF, and suspected community-acquired pneumonia requiring intubation and mechanical ventilation. History of tobacco use   OT comments  Pt received semi-reclined in bed, mother in room. Appearing alert and motivated; willing to work with OT on functional mobility. T/f MIN A; +2 assist during session for management of O2/chair during mobility; MIN A needed for actual mobility with wide RW. See flowsheet below for further details of session. Left seated in recliner; mother in room; with all needs in reach. Pt remains highly motivated, is making terrific progress in therapy, and is an excellent candidate for acute inpatient rehab.   Recommendations for follow up therapy are one component of a multi-disciplinary discharge planning process, led by the attending physician.  Recommendations may be updated based on patient status, additional functional criteria and insurance authorization.    Follow Up Recommendations  Acute inpatient rehab (3hours/day)     Assistance Recommended at Discharge Frequent or constant Supervision/Assistance  Patient can return home with the following  Two people to help with walking and/or transfers;A lot of help with bathing/dressing/bathroom;Assist for transportation;Assistance with cooking/housework;Help with stairs or ramp for entrance   Equipment Recommendations  Other (comment) (defer to next venue of care)    Recommendations for Other Services      Precautions / Restrictions Precautions Precautions: Fall Restrictions Weight Bearing Restrictions: No       Mobility Bed Mobility Overal bed mobility: Needs Assistance Bed Mobility: Supine to Sit     Supine to sit: Supervision      General bed mobility comments: increased time and effort required. cues for safety as patient is mildly impulsive with activity    Transfers Overall transfer level: Needs assistance Equipment used: Rolling walker (2 wheels) Transfers: Sit to/from Stand, Bed to chair/wheelchair/BSC Sit to Stand: Min guard, +2 safety/equipment           General transfer comment: cues for safety, hand placement with sitting and standing. seated rest break required with hallway ambulation. second person required for chair follow/equipment management (O2 line, chair follow).     Balance Overall balance assessment: Needs assistance Sitting-balance support: Feet supported Sitting balance-Leahy Scale: Good     Standing balance support: Bilateral upper extremity supported Standing balance-Leahy Scale: Fair Standing balance comment: with RW for UE support                           ADL either performed or assessed with clinical judgement   ADL                       Lower Body Dressing: Minimal assistance Lower Body Dressing Details (indicate cue type and reason): anticipate overall MIN A; pt able to lean forward and on BIL socks today with extra time; OT assisted MIN A on L foot to push up sock.                    Extremity/Trunk Assessment Upper Extremity Assessment Upper Extremity Assessment: Generalized weakness;Overall [facility] for tasks assessed   Lower Extremity Assessment Lower Extremity Assessment: Defer to PT evaluation        Vision       Perception     Praxis  Cognition Arousal/Alertness: Awake/alert Behavior During Therapy: WFL for tasks assessed/performed Overall Cognitive Status: Within Functional Limits for tasks assessed                                 General Comments: patient is cooperative and able to follow commands consistently; still unable to speak 2/2 trach. communicates with gestures, mouthing words, and writing.         Exercises      Shoulder Instructions       General Comments Pt able to walk in hallway, FiO2 35% 6L O2, remained 100%. MIN A during mobility (extra assistance for equipment); pt able to walk for approx 3 minutes; needing about 1 minute rest break between. Remains highly motivated and pleasant.    Pertinent Vitals/ Pain       Pain Assessment Pain Assessment: No/denies pain  Home Living                                          Prior Functioning/Environment              Frequency  Min 3X/week        Progress Toward Goals  OT Goals(current goals can now be found in the care plan section)  Progress towards OT goals: Progressing toward goals  Acute Rehab OT Goals Patient Stated Goal: to get better and go home OT Goal Formulation: With patient Time For Goal Achievement: 07/10/22 Potential to Achieve Goals: Good ADL Goals Pt Will Perform Grooming: with min assist;standing Pt Will Perform Lower Body Dressing: with min assist;sit to/from stand Pt Will Transfer to Toilet: with min assist;ambulating Pt Will Perform Toileting - Clothing Manipulation and hygiene: with min assist;sit to/from stand  Plan Discharge plan remains appropriate;Frequency remains appropriate    Co-evaluation      Reason for Co-Treatment: Complexity of the patient's impairments (multi-system involvement);For patient/therapist safety;To address functional/ADL transfers PT goals addressed during session: Mobility/safety with mobility OT goals addressed during session: Proper use of Adaptive equipment and DME;ADL's and self-care      AM-PAC OT "6 Clicks" Daily Activity     Outcome Measure   Help from another person eating meals?: None Help from another person taking care of personal grooming?: None Help from another person toileting, which includes using toliet, bedpan, or urinal?: A Lot Help from another person bathing (including washing, rinsing, drying)?: A Lot Help from  another person to put on and taking off regular upper body clothing?: A Little Help from another person to put on and taking off regular lower body clothing?: A Lot 6 Click Score: 17    End of Session Equipment Utilized During Treatment: Rolling walker (2 wheels);Oxygen;Other (comment) (recliner chair)  OT Visit Diagnosis: Unsteadiness on feet (R26.81);Repeated falls (R29.6);Muscle weakness (generalized) (M62.81)   Activity Tolerance Patient tolerated treatment well   Patient Left in chair;with call bell/phone within reach;with family/visitor present (mother in room)   Nurse Communication Mobility status        Time: [id number] OT Time Calculation (min): 30 min  Charges: OT General Charges $OT Visit: 1 Visit OT Treatments $Therapeutic Activity: 8-22 mins  Waymon Amato, MS, OTR/L   Vania Rea 07/03/2022, 1:11 PM

## 2022-07-03 NOTE — Consult Note (Signed)
Desiree Stone, Baus [id number] 11-12-1975 Sidney Ace, MD  Reason for Consult: Downsizing trach tube  HPI: The patient is a 47 year old African-American female who was admitted with acute hypoxic and hypercapnic respiratory failure in the setting of acute COPD extubation, new onset CHF, and suspected community-acquired pneumonia requiring intubation and mechanical ventilation.  Patient is also known to have significant obstructive sleep apnea and morbid obesity.  The patient required tracheostomy on 06/19/2022 because of failure to wean.  She has since been lately weaned from the ventilator and now has just been on trach collar for oxygen over the last several days.  She has a strong cough and is improving rapidly.  Consultation is for evaluation of her airway and downsizing of her trach tube.  Allergies:  Allergies  Allergen Reactions   Abilify [Aripiprazole] Other (See Comments)    Syncope   Effexor [Venlafaxine] Other (See Comments)    Hair Loss   Wellbutrin [Bupropion] Other (See Comments)    Acne     ROS: Review of systems normal other than 12 systems except per HPI.  PMH:  Past Medical History:  Diagnosis Date   Asthma    Diabetes mellitus without complication ([place])    Hypertension     FH:  Family History  Problem Relation Age of Onset   Breast cancer Paternal Aunt 38   Cancer Paternal Grandmother        Colon    SH:  Social History   Socioeconomic History   Marital status: Single    Spouse name: Not on file   Number of children: Not on file   Years of education: Not on file   Highest education level: Not on file  Occupational History   Not on file  Tobacco Use   Smoking status: Former    Packs/day: 0.00    Types: Cigarettes    Quit date: 04/04/2017    Years since quitting: 5.2   Smokeless tobacco: Never  Vaping Use   Vaping Use: Never used  Substance and Sexual Activity   Alcohol use: No   Drug use: No   Sexual activity: Not Currently    Birth  control/protection: None  Other Topics Concern   Not on file  Social History Narrative   Not on file   Social Determinants of Health   Financial Resource Strain: Not on file  Food Insecurity: No Food Insecurity (06/17/2022)   Hunger Vital Sign    Worried About Running Out of Food in the Last Year: Never true    Ran Out of Food in the Last Year: Never true  Transportation Needs: No Transportation Needs (06/17/2022)   PRAPARE - Hydrologist (Medical): No    Lack of Transportation (Non-Medical): No  Physical Activity: Sufficiently Active (05/23/2017)   Exercise Vital Sign    Days of Exercise per Week: 5 days    Minutes of Exercise per Session: 60 min  Stress: No Stress Concern Present (05/23/2017)   [place]    Feeling of Stress : Not at all  Social Connections: Moderately Isolated (05/23/2017)   Social Connection and Isolation Panel [NHANES]    Frequency of Communication with Friends and Family: More than three times a week    Frequency of Social Gatherings with Friends and Family: Twice a week    Attends Religious Services: Never    Marine scientist or Organizations: No    Attends Archivist Meetings:  Never    Marital Status: Never married  Intimate Partner Violence: Not At Risk (06/17/2022)   Humiliation, Afraid, Rape, and Kick questionnaire    Fear of Current or Ex-Partner: No    Emotionally Abused: No    Physically Abused: No    Sexually Abused: No    PSH:  Past Surgical History:  Procedure Laterality Date   carpel tunnel     COLONOSCOPY WITH PROPOFOL N/A 12/08/2014   Procedure: COLONOSCOPY WITH PROPOFOL;  Surgeon: Lollie Sails, MD;  Location: [facility] ENDOSCOPY;  Service: Endoscopy;  Laterality: N/A;   ESOPHAGOGASTRODUODENOSCOPY     TRACHEOSTOMY TUBE PLACEMENT N/A 06/19/2022   Procedure: TRACHEOSTOMY;  Surgeon: Beverly Gust, MD;  Location: [facility] ORS;  Service:  ENT;  Laterality: N/A;    Physical  Exam: The patient is sitting at the bedside and is morbidly obese.  She has an 8 Shiley XLT proximal length tube in place.  It secured around the neck with the trach tie and is good position in the neck.  Cuff is deflated.  She has a strong cough and good breath sounds.  She has not required continuous oximetry.  A fresh 6 Shiley XLT proximal length tube that is cuffless is readied.  The trach tube was changed out without difficulty.  She continues to breathe easily.  She was suctioned of a little bit of mucus but actually had a very strong cough and could cough it right out the trach tube.  Once the mucus was clear a speaking valve was placed on.  Patient was breathing very easily on this and was able to vocalize well.   A/P: The patient had respiratory failure requiring intubation and eventually tracheostomy.  It has been 2 weeks since her tracheostomy.  The trach tube is now downsized to a 6 cuffless Shiley XLT P tube.  She can use a speaking valve this afternoon and evening but should have that removed to go to sleep tonight.  She can put her back on tomorrow morning and start using it all day long.  If she continues to do well she can use this at night as well.  Now that she has a speaking valve on she can have a swallowing study and see if she can get started on an oral diet and potentially get the NG tube removed from her nose.  If she continues to do well then she may be able to go to capping trials during the day.  If she tries a capping trial at night she will need to have her CPAP on to control the sleep apnea.  She should be back on her CPAP before decannulation occurs.   Huey Romans 07/03/2022 5:53 PM

## 2022-07-03 NOTE — Progress Notes (Signed)
Physical Therapy Treatment Patient Details Name: Desiree Stone MRN: [id number] DOB: 11/06/1975 Today's Date: 07/03/2022   History of Present Illness Patient is a 47 year old female with acute hypoxic and hypercapnic respiratory failure, acute COPD exacerbation, new onset CHF, and suspected community-acquired pneumonia requiring intubation and mechanical ventilation. History of tobacco use    PT Comments    Patient is making excellent progress with functional independence and increased activity tolerance this session. The patient walked 2 bouts of 55f with rolling walker and chair following. Seated rest break required between walking bouts. Heart rate 120's with activity and Sp02 100% after walking. The patient continues to have left leg weakness, numbness and would benefit from continued PT to maximize independence and decrease caregiver burden. CIR recommended at discharge.    Recommendations for follow up therapy are one component of a multi-disciplinary discharge planning process, led by the attending physician.  Recommendations may be updated based on patient status, additional functional criteria and insurance authorization.  Follow Up Recommendations  Acute inpatient rehab (3hours/day)     Assistance Recommended at Discharge Intermittent Supervision/Assistance  Patient can return home with the following A little help with walking and/or transfers;A little help with bathing/dressing/bathroom;Help with stairs or ramp for entrance;Assist for transportation   Equipment Recommendations  Other (comment)    Recommendations for Other Services       Precautions / Restrictions Precautions Precautions: Fall Restrictions Weight Bearing Restrictions: No     Mobility  Bed Mobility Overal bed mobility: Needs Assistance Bed Mobility: Supine to Sit     Supine to sit: Supervision     General bed mobility comments: increased time and effort required. cues for safety as patient is  mildly impulsive with activity    Transfers Overall transfer level: Needs assistance Equipment used: Rolling walker (2 wheels) Transfers: Sit to/from Stand, Bed to chair/wheelchair/BSC Sit to Stand: Min guard, +2 safety/equipment           General transfer comment: cues for safety, hand placement with sitting and standing. seated rest break required with hallway ambulation. second person required for chair follow/equipment management    Ambulation/Gait Ambulation/Gait assistance: Min assist Gait Distance (Feet): 30 Feet (x 2 bouts) Assistive device: Rolling walker (2 wheels) (bariatirc) Gait Pattern/deviations: Step-through pattern, Decreased stride length, Decreased stance time - left Gait velocity: decreased     General Gait Details: verbal cues for rolling walker and BLE sequencing. steadying assistance provided. no knee buckling with weight bearing, howeve patient does complain of continued left leg numbness. short rest break required between bouts of walking   Stairs             Wheelchair Mobility    Modified Rankin (Stroke Patients Only)       Balance Overall balance assessment: Needs assistance Sitting-balance support: Feet supported Sitting balance-Leahy Scale: Good     Standing balance support: Bilateral upper extremity supported Standing balance-Leahy Scale: Fair Standing balance comment: with RW for UE support                            Cognition Arousal/Alertness: Awake/alert Behavior During Therapy: WFL for tasks assessed/performed Overall Cognitive Status: Difficult to assess                                 General Comments: patient is cooperative and able to follow commands consistently  Exercises      General Comments General comments (skin integrity, edema, etc.): patient ambulated with trach mask with Fi02 35%, 6 L02.      Pertinent Vitals/Pain Pain Assessment Pain Assessment: Faces Faces Pain  Scale: Hurts a little bit Pain Location: L leg Pain Descriptors / Indicators: Numbness Pain Intervention(s): Limited activity within patient's tolerance, Monitored during session    Home Living                          Prior Function            PT Goals (current goals can now be found in the care plan section) Acute Rehab PT Goals Patient Stated Goal: to walk, go home PT Goal Formulation: With patient Time For Goal Achievement: 07/10/22 Potential to Achieve Goals: Good Progress towards PT goals: Progressing toward goals    Frequency    Min 2X/week      PT Plan Current plan remains appropriate    Co-evaluation PT/OT/SLP Co-Evaluation/Treatment: Yes Reason for Co-Treatment: Complexity of the patient's impairments (multi-system involvement);For patient/therapist safety;To address functional/ADL transfers PT goals addressed during session: Mobility/safety with mobility        AM-PAC PT "6 Clicks" Mobility   Outcome Measure  Help needed turning from your back to your side while in a flat bed without using bedrails?: A Lot Help needed moving from lying on your back to sitting on the side of a flat bed without using bedrails?: A Lot Help needed moving to and from a bed to a chair (including a wheelchair)?: Total Help needed standing up from a chair using your arms (e.g., wheelchair or bedside chair)?: A Little Help needed to walk in hospital room?: A Lot Help needed climbing 3-5 steps with a railing? : Total 6 Click Score: 11    End of Session Equipment Utilized During Treatment: Oxygen Activity Tolerance: Patient tolerated treatment well Patient left: in chair;with call bell/phone within reach;with family/visitor present Nurse Communication: Mobility status PT Visit Diagnosis: Unsteadiness on feet (R26.81);Muscle weakness (generalized) (M62.81)     Time: [id number] PT Time Calculation (min) (ACUTE ONLY): 31 min  Charges:  $Therapeutic Activity: 8-22  mins                     [name], PT, MPT    Percell Locus 07/03/2022, 12:55 PM

## 2022-07-04 DIAGNOSIS — J9602 Acute respiratory failure with hypercapnia: Secondary | ICD-10-CM | POA: Diagnosis not present

## 2022-07-04 DIAGNOSIS — J9601 Acute respiratory failure with hypoxia: Secondary | ICD-10-CM | POA: Diagnosis not present

## 2022-07-04 LAB — GLUCOSE, CAPILLARY
Glucose-Capillary: 134 mg/dL — ABNORMAL HIGH (ref 70–99)
Glucose-Capillary: 141 mg/dL — ABNORMAL HIGH (ref 70–99)
Glucose-Capillary: 148 mg/dL — ABNORMAL HIGH (ref 70–99)
Glucose-Capillary: 165 mg/dL — ABNORMAL HIGH (ref 70–99)
Glucose-Capillary: 173 mg/dL — ABNORMAL HIGH (ref 70–99)
Glucose-Capillary: 200 mg/dL — ABNORMAL HIGH (ref 70–99)
Glucose-Capillary: 97 mg/dL (ref 70–99)

## 2022-07-04 MED ORDER — IPRATROPIUM-ALBUTEROL 0.5-2.5 (3) MG/3ML IN SOLN
3.0000 mL | Freq: Two times a day (BID) | RESPIRATORY_TRACT | Status: DC
  Administered 2022-07-04 – 2022-07-05 (×2): 3 mL via RESPIRATORY_TRACT
  Filled 2022-07-04 (×2): qty 3

## 2022-07-04 MED ORDER — MELATONIN 5 MG PO TABS: 5.0000 mg | ORAL_TABLET | Freq: Once | ORAL | Status: DC

## 2022-07-04 MED ORDER — MELATONIN 5 MG PO TABS
5.0000 mg | ORAL_TABLET | Freq: Once | ORAL | Status: AC
  Administered 2022-07-04: 5 mg via ORAL
  Filled 2022-07-04: qty 1

## 2022-07-04 MED ORDER — GLUCERNA 1.5 CAL PO LIQD: 1000.0000 mL | ORAL | Status: DC

## 2022-07-04 MED ORDER — FREE WATER: 135.0000 mL | Status: DC

## 2022-07-04 MED ORDER — FUROSEMIDE 40 MG PO TABS
40.0000 mg | ORAL_TABLET | Freq: Every day | ORAL | Status: DC
  Administered 2022-07-05: 40 mg
  Filled 2022-07-04: qty 1

## 2022-07-04 NOTE — Progress Notes (Signed)
SLP Cancellation Note  Patient Details Name: [name] MRN: [id number] DOB: Jan 01, 1976   Cancelled treatment:       Reason Eval/Treat Not Completed:  (chart reviewed; met w/ pt in room then consulted NSG and MD) Pt was seen this morning for PMV tx, then BSE. Pt was wearing the PMV adequately and conversing in conversation w/ increased comfort of breathing.  Pt's trach was downsized last evening by ENT -- she stated she notices "coughing from the irritation", including more airflow and phlegm. Pt also endorsed "feeling like there is something in my throat all the time" referring to the NGT(?).  Pt has been taking ice chips(therapeutic) prior to [facility] downsize w/out difficulty.   Discussed pt's presentation w/ MD who agreed w/ removal of the NGT prior to full BSE, for assessment of swallowing and hopefully initiation of an oral diet. NSG updated.       Orinda Kenner, MS, CCC-SLP Speech Language Pathologist Rehab Services; [place] [phone] (ascom) Leathia Farnell 07/04/2022, 11:37 AM

## 2022-07-04 NOTE — Progress Notes (Signed)
Inpatient Rehab Admissions Coordinator:   Was able to speak to patient over the phone today.  I reviewed CIR goals/expectations.  Pt confirms plan for her mom to come stay with her at discharge (spoke to [place] on 2/9).  We reviewed cost of care and medicaid.  Note that pt has already been referred to [place] for screening.  She is in agreement to proceed with CIR admit when medically cleared and bed available.  Will follow.   Shann Medal, PT, DPT Admissions Coordinator [phone] 07/04/22  1:23 PM

## 2022-07-04 NOTE — Progress Notes (Signed)
MD order to remove NGT to trial oral feedings.  Patient tolerated removal well.  Mild discomfort in throat.  Speech to evaluate later today.

## 2022-07-04 NOTE — Progress Notes (Signed)
PROGRESS NOTE    Desiree Stone  [id number] DOB: Aug 04, 1975 DOA: 06/06/2022 PCP: [facility]    Brief Narrative:  47 year old female admitted with acute hypoxic and hypercapnic respiratory failure in the setting of acute COPD exacerbation, new onset CHF, and suspected community-acquired pneumonia requiring intubation and mechanical ventilation.   Per chart review patient endorsed increasing dyspnea over the past few months.  Worsening acutely in a couple days before admission.  No history of outpatient oxygen use.  Patient was an active cigarette smoker.  On admission to the emergency department ABG with severe respiratory acidosis.  Initially placed on BiPAP.  Follow-up ABGs with persistent respiratory acidosis in the setting adjustment.  Decision made to urgently intubate patient and placed on mechanical ventilation Elder Love support.  Patient was initially admitted to ICU on 1/17.  Remained mechanically ventilated until Monday 1/29.  Tracheostomy placed at that time.  Patient remained on vent for extended period of time and was gradually weaned off.  Patient developed developed fever that was treated with a course of intravenous cefepime.  She is on trach collar off the ventilator.  May qualify for CIR.  Had NG tube in place, that became clogged on 2/10.  Replaced and placement confirmed by imaging.    2/12: No clinical status changes.  Per documentation there plans for trach downsizing.  I have reached out to ENT  2/13: Tracheostomy downsized by ENT.  Passy-Muir valve in place.  SLP reengaged.  Will attempt to discontinue NGT and efforts to start p.o. intake.   Assessment & Plan:   Principal Problem:   Acute respiratory failure with hypoxia and hypercapnia (HCC) Active Problems:   Obstructive sleep apnea   Type 2 diabetes mellitus (HCC)   Asthma   Essential hypertension   Acute respiratory failure (HCC)  #Acute hypoxic and hypercapnic respiratory failure in  the setting of acute COPD exacerbation, new onset CHF, and pneumonia PMHx: OSA on CPAP, asthma, tobacco use Patient no longer ventilator dependent.  Tracheostomy in place. Patient status post trach downsizing by ENT.  Passy-Muir valve in place.  SLP reconsulted for advancement of p.o. intake    #Decompensated CHF  #Elevated Troponin secondary to demand ischemia PMHx: HTN, HLD Patient reports orthopnea, dyspnea on exertion & BLE edema. CTa suggestive of Right heart dysfunction and pulmonary HTN  Echocardiogram 06/07/22: LVEF 60 to [id number], normal diastolic parameters, RV function normal - Continuous cardiac monitoring - Maintain MAP >65 - Diuresis as renal function and blood pressure permits~urine output has not been accurately recorded.  Per RN patient has been urinating without difficulty.  Discontinue IV Lasix.  Start p.o. Lasix 40 mg daily on 2/13   .#HAP~treated   - Trend WBC and monitor fever curve -No further antibiotics at this time   #Hypokalemia~improving   - Trend BMP - Replace electrolytes as indicated    #Diarrhea-  -No signs of infectious diarrhea.  Suspect secondary to tube feeds.  As needed Imodium   #Type II diabetes mellitus~difficult to control   - SSI, scheduled novolog, and semglee  - Diabetes coordinator consulted appreciate input   Abdominal wall abscess Complex fluid collection noted in left lower quadrant on nonvascular ultrasound.  No current indication for incision and drainage.  Placed warm compress   DVT prophylaxis: SQ Lovenox Code Status: Full Family Communication: Family member at bedside 2/13 Disposition Plan: Status is: Inpatient Remains inpatient appropriate because: Status post tracheostomy.  Tracheostomy has been downsized.  Passy-Muir valve in place.  Discharge  once confirmation of patient tolerance of p.o. intake.  Plan for CIR at time of discharge.   Level of care: Progressive  Consultants:  IR  Procedures:   Tracheostomy  Antimicrobials: None   Subjective: Seen and examined.  Mentating clearly.  Answers all questions appropriately.  Endorses pain. Left lower quadrant Objective: Vitals:   07/04/22 0255 07/04/22 0412 07/04/22 0833 07/04/22 0930  BP:  132/79 137/81   Pulse:  (!) 102 (!) 104   Resp: (!) 23 20 20   $ Temp:  97.7 F (36.5 C) 98.7 F (37.1 C)   TempSrc:  Oral Oral   SpO2: 97% 93% 100%   Weight:    129.6 kg  Height:        Intake/Output Summary (Last 24 hours) at 07/04/2022 1030 Last data filed at 07/04/2022 0413 Gross per 24 hour  Intake 50 ml  Output 400 ml  Net -350 ml   Filed Weights   06/29/22 0431 07/03/22 0849 07/04/22 0930  Weight: 136 kg 126.8 kg 129.6 kg    Examination:  General exam: NAD Respiratory system: Status post tracheostomy.  Coarse breath sounds bilaterally.  Normal work of breathing.  Trach collar 40% Cardiovascular system: S1-S2, RRR, no murmurs, no pedal edema Gastrointestinal system: Obese, soft, NT/ND, normal bowel sounds, painful firm swelling in left lower quadrant Central nervous system: Alert and oriented. No focal neurological deficits. Extremities: Symmetric 5 x 5 power. Skin: No rashes, lesions or ulcers Psychiatry: Judgement and insight appear normal. Mood & affect appropriate.     Data Reviewed: I have personally reviewed following labs and imaging studies  CBC: Recent Labs  Lab 06/29/22 0436 06/30/22 0709 07/01/22 0427 07/02/22 0606 07/03/22 0354  WBC 12.0* 10.6* 9.5 11.1* 12.7*  NEUTROABS 8.8* 7.4  --   --   --   HGB 10.7* 11.2* 11.0* 11.1* 11.2*  HCT 39.8 40.4 39.8 40.5 41.3  MCV 74.7* 74.5* 74.8* 75.6* 76.6*  PLT 381 380 364 359 0000000   Basic Metabolic Panel: Recent Labs  Lab 06/28/22 0600 06/29/22 0436 06/30/22 0709 07/01/22 0427 07/02/22 0606 07/03/22 0354  NA 142 141 137 136 138 139  K 3.6 3.5 4.0 3.4* 3.7 3.4*  CL 102 98 95* 93* 98 99  CO2 32 33* 33* 34* 31 30  GLUCOSE 152* 176* 155* 117* 151*  167*  BUN 24* 31* 26* 23* 24* 24*  CREATININE 0.38* 0.53 0.49 0.47 0.54 0.50  CALCIUM 9.4 9.4 9.1 9.2 9.0 9.1  MG 2.2 2.3 2.3  --   --   --   PHOS 4.6 5.6* 5.4* 6.3* 5.4* 4.7*   GFR: Estimated Creatinine Clearance: 105.6 mL/min (by C-G formula based on SCr of 0.5 mg/dL). Liver Function Tests: Recent Labs  Lab 07/01/22 0427 07/02/22 0606 07/03/22 0354  ALBUMIN 2.9* 3.1* 3.1*   No results for input(s): "LIPASE", "AMYLASE" in the last 168 hours. No results for input(s): "AMMONIA" in the last 168 hours. Coagulation Profile: No results for input(s): "INR", "PROTIME" in the last 168 hours. Cardiac Enzymes: No results for input(s): "CKTOTAL", "CKMB", "CKMBINDEX", "TROPONINI" in the last 168 hours. BNP (last 3 results) No results for input(s): "PROBNP" in the last 8760 hours. HbA1C: No results for input(s): "HGBA1C" in the last 72 hours. CBG: Recent Labs  Lab 07/03/22 1514 07/03/22 2005 07/03/22 2359 07/04/22 0409 07/04/22 0834  GLUCAP 135* 168* 134* 173* 165*   Lipid Profile: No results for input(s): "CHOL", "HDL", "LDLCALC", "TRIG", "CHOLHDL", "LDLDIRECT" in the last 72 hours. Thyroid  Function Tests: No results for input(s): "TSH", "T4TOTAL", "FREET4", "T3FREE", "THYROIDAB" in the last 72 hours. Anemia Panel: No results for input(s): "VITAMINB12", "FOLATE", "FERRITIN", "TIBC", "IRON", "RETICCTPCT" in the last 72 hours.  Sepsis Labs: No results for input(s): "PROCALCITON", "LATICACIDVEN" in the last 168 hours.  No results found for this or any previous visit (from the past 240 hour(s)).       Radiology Studies: US Abdomen Limited  Result Date: 07/03/2022 CLINICAL DATA:  Left lower abdominal tenderness and masslike area for 1 day. EXAM: ULTRASOUND ABDOMEN LIMITED COMPARISON:  Abdominal CT of 06/22/2022 FINDINGS: 3.4 x 1.3 x 2.4 cm heterogeneously hypoechoic lesion within the left lower abdominal wall, corresponding to the area of clinical concern. This demonstrates  peripheral vascularity. IMPRESSION: Nonspecific 3.4 cm complex cystic lesion within the area of concern of the anterior abdominal wall. Appearance is nonspecific. Considerations include phlegmon/developing abscess, hematoma (with possible superimposed infection) or a necrotic soft tissue mass. Electronically Signed   By: Abigail Miyamoto M.D.   On: 07/03/2022 13:30        Scheduled Meds:  atorvastatin  40 mg Per Tube QHS   Chlorhexidine Gluconate Cloth  6 each Topical Q0600   enoxaparin (LOVENOX) injection  0.5 mg/kg Subcutaneous Q24H   free water  135 mL Per Tube Q4H   furosemide  40 mg Per Tube Daily   insulin aspart  0-9 Units Subcutaneous Q4H   insulin aspart  7 Units Subcutaneous Q4H   insulin glargine-yfgn  30 Units Subcutaneous BID   ipratropium-albuterol  3 mL Nebulization Q6H   sertraline  50 mg Per Tube QHS   sodium chloride flush  3 mL Intravenous Q12H   Continuous Infusions:  sodium chloride Stopped (06/30/22 1100)   famotidine (PEPCID) IV 20 mg (07/03/22 2111)   feeding supplement (GLUCERNA 1.5 CAL)       LOS: 27 days  \ Sidney Ace, MD [facility]   If 7PM-7AM, please contact night-coverage  07/04/2022, 10:30 AM

## 2022-07-04 NOTE — Progress Notes (Signed)
Occupational Therapy Treatment Patient Details Name: Desiree Stone MRN: [id number] DOB: 10/11/1975 Today's Date: 07/04/2022   History of present illness Patient is a 47 year old female with acute hypoxic and hypercapnic respiratory failure, acute COPD exacerbation, new onset CHF, and suspected community-acquired pneumonia requiring intubation and mechanical ventilation. History of tobacco use   OT comments  Ms Milbrath was seen for OT/PT co-treatment on this date. Upon arrival to room pt seated EOB, agreeable to tx. Pt requires MIN A + RW for functional mobility, +2 for safety. MOD A for LB access seated EOB. Requires BUE support static standing, will plan to trial standing grooming no AD use next session. Pt making good progress toward goals, will continue to follow POC. Discharge recommendation remains appropriate.     Recommendations for follow up therapy are one component of a multi-disciplinary discharge planning process, led by the attending physician.  Recommendations may be updated based on patient status, additional functional criteria and insurance authorization.    Follow Up Recommendations  Acute inpatient rehab (3hours/day)     Assistance Recommended at Discharge Frequent or constant Supervision/Assistance  Patient can return home with the following  Two people to help with walking and/or transfers;A lot of help with bathing/dressing/bathroom;Assist for transportation;Assistance with cooking/housework;Help with stairs or ramp for entrance   Equipment Recommendations  Other (comment) (defer)    Recommendations for Other Services      Precautions / Restrictions Precautions Precautions: Fall Restrictions Weight Bearing Restrictions: No       Mobility Bed Mobility               General bed mobility comments: received sitting    Transfers Overall transfer level: Needs assistance Equipment used: Rolling walker (2 wheels) Transfers: Sit to/from Stand Sit to  Stand: Min assist, +2 safety/equipment                 Balance Overall balance assessment: Needs assistance Sitting-balance support: Feet supported Sitting balance-Leahy Scale: Good     Standing balance support: Bilateral upper extremity supported Standing balance-Leahy Scale: Fair                             ADL either performed or assessed with clinical judgement   ADL Overall ADL's : Needs assistance/impaired                                       General ADL Comments: MIN A + RW toilet t/f, +2 for safety. MOD A for LB access seated EOB. 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                   Pertinent Vitals/ Pain       Pain Assessment Pain Assessment: No/denies pain   Frequency  Min 3X/week        Progress Toward Goals  OT Goals(current goals can now be found in the care plan section)  Progress towards  OT goals: Progressing toward goals  Acute Rehab OT Goals Patient Stated Goal: to return to work OT Goal Formulation: With patient Time For Goal Achievement: 07/10/22 Potential to Achieve Goals: Good ADL Goals Pt Will Perform Grooming: with min assist;standing Pt Will Perform Lower Body Dressing: with min assist;sit to/from stand Pt Will Transfer to Toilet: with min assist;ambulating Pt Will Perform Toileting - Clothing Manipulation and hygiene: with min assist;sit to/from stand  Plan Discharge plan remains appropriate;Frequency remains appropriate    Co-evaluation    PT/OT/SLP Co-Evaluation/Treatment: Yes Reason for Co-Treatment: Complexity of the patient's impairments (multi-system  involvement);For patient/therapist safety;To address functional/ADL transfers PT goals addressed during session: Mobility/safety with mobility OT goals addressed during session: Proper use of Adaptive equipment and DME;ADL's and self-care      AM-PAC OT "6 Clicks" Daily Activity     Outcome Measure   Help from another person eating meals?: None Help from another person taking care of personal grooming?: None Help from another person toileting, which includes using toliet, bedpan, or urinal?: A Lot Help from another person bathing (including washing, rinsing, drying)?: A Lot Help from another person to put on and taking off regular upper body clothing?: A Little Help from another person to put on and taking off regular lower body clothing?: A Lot 6 Click Score: 17    End of Session    OT Visit Diagnosis: Unsteadiness on feet (R26.81);Repeated falls (R29.6);Muscle weakness (generalized) (M62.81)   Activity Tolerance Patient tolerated treatment well   Patient Left in chair;with call bell/phone within reach;with family/visitor present   Nurse Communication Mobility status        Time: [id number] OT Time Calculation (min): 26 min  Charges: OT General Charges $OT Visit: 1 Visit OT Treatments $Self Care/Home Management : 8-22 mins  Dessie Coma, M.S. OTR/L  07/04/22, 1:15 PM  ascom [phone]

## 2022-07-04 NOTE — Evaluation (Signed)
Clinical/Bedside Swallow Evaluation Patient Details  Name: Desiree Stone MRN: [id number] Date of Birth: 06/22/1975  Today's Date: 07/04/2022 Time: SLP Start Time (ACUTE ONLY): 1430 SLP Stop Time (ACUTE ONLY): 1520 SLP Time Calculation (min) (ACUTE ONLY): 50 min  Past Medical History:  Past Medical History:  Diagnosis Date   Asthma    Diabetes mellitus without complication ([place])    Hypertension    Past Surgical History:  Past Surgical History:  Procedure Laterality Date   carpel tunnel     COLONOSCOPY WITH PROPOFOL N/A 12/08/2014   Procedure: COLONOSCOPY WITH PROPOFOL;  Surgeon: Lollie Sails, MD;  Location: [facility] ENDOSCOPY;  Service: Endoscopy;  Laterality: N/A;   ESOPHAGOGASTRODUODENOSCOPY     TRACHEOSTOMY TUBE PLACEMENT N/A 06/19/2022   Procedure: TRACHEOSTOMY;  Surgeon: Beverly Gust, MD;  Location: [facility] ORS;  Service: ENT;  Laterality: N/A;   HPI:  Pt is a 47 year old female admitted with acute hypoxic and hypercapnic respiratory failure in the setting of acute COPD exacerbation, new onset CHF, Obesity, ongoing tobacco use, and suspected community-acquired pneumonia.  She endorsed that over the last several months, she has been experiencing gradually worsening shortness of breath and dyspnea on exertion in addition to orthopnea. In addition, she endorses a productive cough with thick sputum and palpitations.  Post admission, she required intubation and mechanical ventilation d/t ongoing Pulmonary decline.  Pt was orally intubated from 06/08/22-06/19/22 when tracheostomy was performed. Pt s/p tracheostomy; NGT remains in place.   CXR on 06/22/22: Bilateral mid and lower lung interstitial thickening with  left-greater-than-right basilar heterogeneous airspace opacities,  similar to prior. Findings could represent atelectasis or pneumonia.  3. Small left pleural effusion. Update: ENT downsized pt's trach on 07/03/2022 to a Shiley #6 diamerer. She is now wearing PMV. independently w/out  discomfort; vocal quality is declined.    Assessment / Plan / Recommendation  Clinical Impression  Pt seen for full assessment of swallowing now that she is wearing the PMV s/p downsize of trach by ENT last evening. She is alert, verbally responsive and engaged in conversation w/ SLP and others in room. Pt is on TC O2 support of 8L; afebrile. Pt sitting in chair.   OF NOTE: pt's Large Bore NGT removed earlier per MD order s/p pt's report of a constant feeling of "something hanging in my throat that I feel every time I swallow". Also noted declined vocal quality -- pt endorsed "months" of "severe coughing all the time" at home prior to admit.  Pt and Mother present (and NSG) reported pt has been eating ice chips, and drinking water "sometimes", per pt.  Pt presented w/ an intermittent cough while talking w/ SLP/Staff PRIOR TO po's.   Pt explained general aspiration precautions and the need to wear PMV w/ all oral intake; she agreed verbally to the need for following all instructions/education. Pt fed self po trials of ice chips, thin liquids, puree and minced/softened solids.  Pt exhibited an inconsistent, delayed dry cough AS AT BASELINE PRIOR TO PO PRESENTATION during po trials of various consistencies. No decline in vocal quality nor O2 sats during/post po trials. No decline in overall status post po trials. Coughing did not appear immediate to the swallowing -- pt endorsed laryngopharyngeal irritation s/p large bore NGT and recent trach change; increased phlegm; increased coughing last night during sleep. Pt is at risk for aspiration/aspiration pneumonia in setting of oral intubation and lengthy illness. Oral phase appeared grossly [facility] for bolus management, mastication of softened solids, and timely A-P  transfer for swallowing; oral clearing achieved w/ all consistencies. Pt appeared min cautious as she took bites/sips at times. NSG denied any deficits in swallowing as well.    Pt appears to present  w/ potential risk for pharyngeal phase dysphagia in setting of lengthy illness and deconditioning, oral intubation, tracheostomy.  Pt is eager to eat/drink and has been eating ice chips and drinking water "sometimes" per pt report. Pt is able to wear the PMV PRN at this time(s/p trach downsize by ENT on 07/03/2022).   Recommend a trial diet initiation of a Dysphagia level 1 w/ thin liquids via Cup w/ STRICT aspiration precautions as posted in room(including small/single bites/sips, clear well w/ f/u swallow as needed, reduce talking during po's); pt and Mother/NSG given education on such as well. MUST wear the PMV for all oral intake. STOP oral intake if any decline in status or overt s/s of aspiration noted -- NSG updated, agreed.  ST services will f/u w/ toleration of diet tomorrow; f/u w/ objective swallow assessment(MBSS) as indicated. MD updated on above POC and agreed. Pt agreed.  SLP Visit Diagnosis: Dysphagia, unspecified (R13.10) (in setting of Tracheostomy)    Aspiration Risk  Risk for inadequate nutrition/hydration;Mild aspiration risk;Moderate aspiration risk    Diet Recommendation   trial diet initiation of a Dysphagia level 1 w/ thin liquids via Cup w/ STRICT aspiration precautions as posted in room, chart  Medication Administration: Crushed with puree    Other  Recommendations Recommended Consults:  (Dietician f/u) Oral Care Recommendations: Oral care BID;Oral care before and after PO;Patient independent with oral care (setup)    Recommendations for follow up therapy are one component of a multi-disciplinary discharge planning process, led by the attending physician.  Recommendations may be updated based on patient status, additional functional criteria and insurance authorization.  Follow up Recommendations Acute inpatient rehab (3hours/day)      Assistance Recommended at Discharge  PRN-intermittent for support  Functional Status Assessment Patient has had a recent decline in  their functional status and demonstrates the ability to make significant improvements in function in a reasonable and predictable amount of time.  Frequency and Duration min 2x/week  2 weeks       Prognosis Prognosis for improved oropharyngeal function: Fair (-Good) Barriers to Reach Goals: Time post onset;Severity of deficits Barriers/Prognosis Comment: lengthy illness, intubation/tracheostomy      Swallow Study   General Date of Onset: 06/06/22 HPI: Pt is a 47 year old female admitted with acute hypoxic and hypercapnic respiratory failure in the setting of acute COPD exacerbation, new onset CHF, Obesity, ongoing tobacco use, and suspected community-acquired pneumonia.  She endorsed that over the last several months, she has been experiencing gradually worsening shortness of breath and dyspnea on exertion in addition to orthopnea. In addition, she endorses a productive cough with thick sputum and palpitations.  Post admission, she required intubation and mechanical ventilation d/t ongoing Pulmonary decline.  Pt was orally intubated from 06/08/22-06/19/22 when tracheostomy was performed. Pt s/p tracheostomy; NGT remains in place.   CXR on 06/22/22: Bilateral mid and lower lung interstitial thickening with  left-greater-than-right basilar heterogeneous airspace opacities,  similar to prior. Findings could represent atelectasis or pneumonia.  3. Small left pleural effusion. Update: ENT downsized pt's trach on 07/03/2022 to a Shiley #6 diamerer. She is now wearing PMV. independently w/out discomfort; vocal quality is declined. Type of Study: Bedside Swallow Evaluation Previous Swallow Assessment: none Diet Prior to this Study: NPO;Large bore NG tube (removed prior to full BSE)  Temperature Spikes Noted: No (wbc 12.7) Respiratory Status: Trach Collar (8L) History of Recent Intubation:  (see chart notes of this admit) Behavior/Cognition: Alert;Cooperative;Pleasant mood Oral Cavity Assessment: Within  Functional Limits Oral Care Completed by SLP: Yes Oral Cavity - Dentition: Adequate natural dentition Vision: Functional for self-feeding Self-Feeding Abilities: Able to feed self Patient Positioning: Upright in chair Baseline Vocal Quality: Hoarse;Low vocal intensity (gravely; PMV in place) Volitional Cough: Strong Volitional Swallow: Able to elicit    Oral/Motor/Sensory Function Overall Oral Motor/Sensory Function: Within functional limits   Ice Chips Ice chips: Impaired Presentation: Spoon;Self Fed (10 trials) Oral Phase Impairments:  (wfl) Pharyngeal Phase Impairments: Cough - Delayed Other Comments: intermittent b/t trials   Thin Liquid Thin Liquid: Impaired Presentation: Cup;Self Fed (10+ trials) Oral Phase Impairments:  (wfl) Pharyngeal  Phase Impairments: Cough - Delayed Other Comments: intermittent b/t trials    Nectar Thick Nectar Thick Liquid: Not tested   Honey Thick Honey Thick Liquid: Not tested   Puree Puree: Impaired Presentation: Spoon;Self Fed (10+ trials) Oral Phase Impairments:  (wfl) Pharyngeal Phase Impairments: Cough - Delayed Other Comments: intermittent b/t trials   Solid     Solid: Impaired Presentation: Self Fed (5 trials) Oral Phase Impairments:  (slow, careful chewing) Oral Phase Functional Implications:  (min prolonged - cautious) Pharyngeal Phase Impairments: Cough - Delayed Other Comments: intermittent b/t trials         Orinda Kenner, MS, CCC-SLP Speech Language Pathologist Rehab Services; [place] [phone] (ascom) Ellayna Hilligoss 07/04/2022,6:31 PM

## 2022-07-04 NOTE — PMR Pre-admission (Signed)
PMR Admission Coordinator Pre-Admission Assessment  Patient: Desiree Stone is an 47 y.o., female MRN: [id number] DOB: May 12, 1976 Height: 4' 11"$  (149.9 cm) Weight: 129.6 kg  Insurance Information HMO:     PPO:      PCP:      IPA:      80/20:      OTHER:  PRIMARY: uninsured      Policy#:       Subscriber:  CM Name:       Phone#:      Fax#:  Pre-Cert#:       Employer:  Benefits:  Phone #:      Name:  Eff. Date:      Deduct:       Out of Pocket Max:       Life Max:  CIR:       SNF:  Outpatient:      Co-Pay:  Home Health:       Co-Pay:  DME:      Co-Pay:  Providers:  SECONDARY:       Policy#:      Phone#:   Development worker, community:       Phone#:   The Engineer, petroleum" for patients in Inpatient Rehabilitation Facilities with attached "Privacy Act [place] Records" was provided and verbally reviewed with: N/A  Emergency Contact Information Contact Information     Name Relation Home Work [place] Father [phone]  [place] E Mother [phone]  [phone]   [place] Fulling Sister [phone]  [phone]       Current Medical History  Patient Admitting Diagnosis: debility   History of Present Illness: ***    Patient's medical record from [facility] has been reviewed by the rehabilitation admission coordinator and physician.  Past Medical History  Past Medical History:  Diagnosis Date   Asthma    Diabetes mellitus without complication (Jack)    Hypertension     Has the patient had major surgery during 100 days prior to admission? Yes  Family History   family history includes Breast cancer (age of onset: 84) in her paternal aunt; Cancer in her paternal grandmother.  Current Medications  Current Facility-Administered Medications:    0.9 %  sodium chloride infusion, , Intravenous, PRN, Flora Lipps, MD, Stopped at 06/30/22 1100   acetaminophen (TYLENOL) tablet 650 mg, 650 mg, Per Tube, Q4H PRN, 650 mg at  07/01/22 2055 **OR** acetaminophen (TYLENOL) suppository 650 mg, 650 mg, Rectal, Q6H PRN, Rust-Chester, Britton L, NP   atorvastatin (LIPITOR) tablet 40 mg, 40 mg, Per Tube, QHS, Rust-Chester, Britton L, NP, 40 mg at 07/03/22 2113   Chlorhexidine Gluconate Cloth 2 % PADS 6 each, 6 each, Topical, Q0600, Kasa, Kurian, MD, 6 each at 06/30/22 2217   enoxaparin (LOVENOX) injection 65 mg, 0.5 mg/kg, Subcutaneous, Q24H, Wynelle Cleveland, RPH, 65 mg at 07/03/22 2108   famotidine (PEPCID) IVPB 20 mg premix, 20 mg, Intravenous, Q12H, Dallie Piles, RPH, Last Rate: 100 mL/hr at 07/04/22 1147, 20 mg at 07/04/22 1147   feeding supplement (GLUCERNA 1.5 CAL) liquid 1,000 mL, 1,000 mL, Per Tube, Continuous, Sreenath, Sudheer B, MD, Stopped at 07/04/22 1141   free water 135 mL, 135 mL, Per Tube, Q4H, Sreenath, Sudheer B, MD   furosemide (LASIX) tablet 40 mg, 40 mg, Per Tube, Daily, Sreenath, Sudheer B, MD   Gerhardt's butt cream, , Topical, QID PRN, Dallie Piles, RPH, 1 Application at [id number]   insulin aspart (novoLOG) injection  0-9 Units, 0-9 Units, Subcutaneous, Q4H, Dallie Piles, RPH, 1 Units at 07/04/22 1306   insulin aspart (novoLOG) injection 7 Units, 7 Units, Subcutaneous, Q4H, Dallie Piles, RPH, 7 Units at 07/04/22 0906   insulin glargine-yfgn (SEMGLEE) injection 30 Units, 30 Units, Subcutaneous, BID, Dallie Piles, RPH, 30 Units at 07/04/22 1144   ipratropium-albuterol (DUONEB) 0.5-2.5 (3) MG/3ML nebulizer solution 3 mL, 3 mL, Nebulization, BID, Sreenath, Sudheer B, MD   loperamide HCl (IMODIUM) 1 MG/7.5ML suspension 4 mg, 4 mg, Per Tube, PRN, Delena Bali, RPH   magic mouthwash w/lidocaine, 5 mL, Oral, TID PRN, Dallie Piles, RPH, 5 mL at 07/03/22 1119   ondansetron (ZOFRAN) tablet 4 mg, 4 mg, Per Tube, Q6H PRN **OR** ondansetron (ZOFRAN) injection 4 mg, 4 mg, Intravenous, Q6H PRN, Rust-Chester, Huel Cote, NP   Oral care mouth rinse, 15 mL, Mouth Rinse, PRN, Flora Lipps, MD    oxyCODONE (Oxy IR/ROXICODONE) immediate release tablet 10 mg, 10 mg, Per Tube, Q6H PRN, Teressa Lower, NP   senna-docusate (Senokot-S) tablet 2 tablet, 2 tablet, Per Tube, QHS PRN, Brantley Stage, Walid A, RPH   sertraline (ZOLOFT) tablet 50 mg, 50 mg, Per Tube, QHS, Teressa Lower, NP, 50 mg at 07/03/22 2113   sodium chloride flush (NS) 0.9 % injection 3 mL, 3 mL, Intravenous, Q12H, Kasa, Kurian, MD, 3 mL at 07/04/22 1000  Patients Current Diet:  Diet Order             DIET - DYS 1 Room service appropriate? Yes with Assist; Fluid consistency: Thin  Diet effective now                   Precautions / Restrictions Precautions Precautions: Fall Restrictions Weight Bearing Restrictions: No   Has the patient had 2 or more falls or a fall with injury in the past year? No  Prior Activity Level Community (5-7x/wk): working FT, driving, no DME  Prior Functional Level Self Care: Did the patient need help bathing, dressing, using the toilet or eating? Independent  Indoor Mobility: Did the patient need assistance with walking from room to room (with or without device)? Independent  Stairs: Did the patient need assistance with internal or external stairs (with or without device)? Independent  Functional Cognition: Did the patient need help planning regular tasks such as shopping or remembering to take medications? Independent  Patient Information Are you of Hispanic, Latino/a,or Spanish origin?: A. No, not of Hispanic, Latino/a, or Spanish origin What is your race?: B. Black or African American Do you need or want an interpreter to communicate with a doctor or health care staff?: 0. No  Patient's Response To:  Health Literacy and Transportation Is the patient able to respond to health literacy and transportation needs?: Yes Health Literacy - How often do you need to have someone help you when you read instructions, pamphlets, or other written material from your doctor or pharmacy?:  Never In the past 12 months, has lack of transportation kept you from medical appointments or from getting medications?: No In the past 12 months, has lack of transportation kept you from meetings, work, or from getting things needed for daily living?: No  Development worker, international aid / [place] Devices/Equipment: None Home Equipment: None  Prior Device Use: Indicate devices/aids used by the patient prior to current illness, exacerbation or injury? None of the above  Current Functional Level Cognition  Overall Cognitive Status: Within Functional Limits for tasks assessed Difficult to assess  due to: Tracheostomy Orientation Level: Oriented X4 General Comments: PMSV in place. patient confirms that she lives with her children with 2-3 steps to enter her home with no rails. her mother can assist her at discharge.    Extremity Assessment (includes Sensation/Coordination)  Upper Extremity Assessment: Generalized weakness, Overall WFL for tasks assessed  Lower Extremity Assessment: Defer to PT evaluation    ADLs  Overall ADL's : Needs assistance/impaired Lower Body Dressing: Minimal assistance Lower Body Dressing Details (indicate cue type and reason): anticipate overall MIN A; pt able to lean forward and on BIL socks today with extra time; OT assisted MIN A on L foot to push up sock. Toileting- Clothing Manipulation and Hygiene: Bed level, Total assistance, +2 for physical assistance Toileting - Clothing Manipulation Details (indicate cue type and reason): pt with bowel incontinence in bed; nursing staff x2 assisting pt for hygiene; new purewick placed General ADL Comments: MIN A + RW toilet t/f, +2 for safety. MOD A for LB access seated EOB. Requires BUE support static standing    Mobility  Overal bed mobility: Needs Assistance Bed Mobility: Supine to Sit Rolling: Min assist, Mod assist Supine to sit: Supervision Sit to supine: Mod assist, +2 for physical assistance General  bed mobility comments: patient sitting up on arrival and post session    Transfers  Overall transfer level: Needs assistance Equipment used: Rolling walker (2 wheels) Transfers: Sit to/from Stand Sit to Stand: Min assist, +2 physical assistance Bed to/from chair/wheelchair/BSC transfer type:: Stand pivot Squat pivot transfers: Mod assist, +2 physical assistance Transfer via Lift Equipment: Lasker transfer comment: verbal cues for hand placement and anterior weight shifting    Ambulation / Gait / Stairs / Wheelchair Mobility  Ambulation/Gait Ambulation/Gait assistance: Min assist, +2 safety/equipment (second person for chair follow) Gait Distance (Feet): 40 Feet (x 2 bouts) Assistive device: Rolling walker (2 wheels) (bariatric rolling walker) Gait Pattern/deviations: Step-to pattern, Decreased stance time - left General Gait Details: steadying assistance provided with cues for sequencing of BLE and rolling walker and offloading LLE for safety due to continued leg numbness. seated rest break requried between bouts of walking. PMSV in place and patient ambulating while on trach mask oxygen, Fi0235%, 6 L02. Sp02 93% or higher with heart rate up to 120's Gait velocity: decreased Pre-gait activities: L lateral weight shifts on last 2 standing bouts (x10/bout)    Posture / Balance Dynamic Sitting Balance Sitting balance - Comments: right lateral lean, likely due to air mattress settings. with feet support, sitting balance is fair Balance Overall balance assessment: Needs assistance Sitting-balance support: Feet supported Sitting balance-Leahy Scale: Good Sitting balance - Comments: right lateral lean, likely due to air mattress settings. with feet support, sitting balance is fair Standing balance support: Bilateral upper extremity supported Standing balance-Leahy Scale: Fair Standing balance comment: with RW for UE support    Special needs/care consideration Oxygen 35% trach  collar, Trach size 6 shiley uncuffed, Skin trach site, and Diabetic management yes   Previous Home Environment (from acute therapy documentation) Living Arrangements: Alone Available Help at Discharge: Family Type of Home: Apartment Home Layout: One level Home Access: Level entry Bathroom Shower/Tub: Chiropodist: [place]: No  Discharge Living Setting Plans for Discharge Living Setting: Patient's home (mom to stay with her) Type of Home at Discharge: House Discharge Home Layout: One level Discharge Home Access: Stairs to enter Entrance Stairs-Rails: None Entrance Stairs-Number of Steps: 3 Discharge Bathroom Shower/Tub: Tub/shower unit Discharge Bathroom Toilet:  Standard Discharge Bathroom Accessibility:  (unsure, states maybe a RW but would be tight) Does the patient have any problems obtaining your medications?: No  Social/Family/Support Systems Anticipated Caregiver: pt's mom, Desiree Stone Anticipated Caregiver's Contact Information: [phone] Ability/Limitations of Caregiver: min assist Caregiver Availability: 24/7 Discharge Plan Discussed with Primary Caregiver: Yes Is Caregiver In Agreement with Plan?: Yes Does Caregiver/Family have Issues with Lodging/Transportation while Pt is in Rehab?: No  Goals Patient/Family Goal for Rehab: PT/OT supervision to mod I, SLP mod I Expected length of stay: 8-10 days Additional Information: new trach.  Dispo: plan to d/c to pt's home, with pt's mom, Desiree Stone, to provide 24/7 supervision if needed Pt/Family Agrees to Admission and willing to participate: Yes Program Orientation Provided & Reviewed with Pt/Caregiver Including Roles  & Responsibilities: Yes  Barriers to Discharge: Trach, Home environment access/layout, Insurance for SNF coverage  Decrease burden of Care through IP rehab admission: nA  Possible need for SNF placement upon discharge: Not anticipated.  Plan to discharge to pt's home with  mom, Desiree Stone, coming to provide initial 24/7 support at discharge.   Patient Condition: I have reviewed medical records from [facility], spoken with CM, and patient and family member. I discussed via phone for inpatient rehabilitation assessment.  Patient will benefit from ongoing PT, OT, and SLP, can actively participate in 3 hours of therapy a day 5 days of the week, and can make measurable gains during the admission.  Patient will also benefit from the coordinated team approach during an Inpatient Acute Rehabilitation admission.  The patient will receive intensive therapy as well as Rehabilitation physician, nursing, social worker, and care management interventions.  Due to bowel management, safety, skin/wound care, disease management, medication administration, pain management, and patient education the patient requires 24 hour a day rehabilitation nursing.  The patient is currently min assist with mobility and basic ADLs.  Discharge setting and therapy post discharge at home with home health is anticipated.  Patient has agreed to participate in the Acute Inpatient Rehabilitation Program and will admit {Time; today/tomorrow:10263}.  Preadmission Screen Completed By:  Michel Santee, PT, DPT  07/04/2022 4:08 PM ______________________________________________________________________   Discussed status with Dr. [name] on *** at *** and received approval for admission today.  Admission Coordinator:  Michel Santee, PT, time Marland KitchenSudie Grumbling ***   Assessment/Plan: Diagnosis: Does the need for close, 24 hr/day Medical supervision in concert with the patient's rehab needs make it unreasonable for this patient to be served in a less intensive setting? {yes_no_potentially:3041433} Co-Morbidities requiring supervision/potential complications: *** Due to {due WC:4653188, does the patient require 24 hr/day rehab nursing? {yes_no_potentially:3041433} Does the patient require coordinated care of a physician, rehab nurse, PT,  OT, and SLP to address physical and functional deficits in the context of the above medical diagnosis(es)? {yes_no_potentially:3041433} Addressing deficits in the following areas: {deficits:3041436} Can the patient actively participate in an intensive therapy program of at least 3 hrs of therapy 5 days a week? {yes_no_potentially:3041433} The potential for patient to make measurable gains while on inpatient rehab is {potential:3041437} Anticipated functional outcomes upon discharge from inpatient rehab: {functional outcomes:304600100} PT, {functional outcomes:304600100} OT, {functional outcomes:304600100} SLP Estimated rehab length of stay to reach the above functional goals is: *** Anticipated discharge destination: {anticipated dc setting:21604} 10. Overall Rehab/Functional Prognosis: {potential:3041437}   MD Signature: ***

## 2022-07-04 NOTE — Progress Notes (Signed)
Physical Therapy Treatment Patient Details Name: Desiree Stone MRN: [id number] DOB: 22-May-1976 Today's Date: 07/04/2022   History of Present Illness Patient is a 47 year old female with acute hypoxic and hypercapnic respiratory failure, acute COPD exacerbation, new onset CHF, and suspected community-acquired pneumonia requiring intubation and mechanical ventilation. History of tobacco use    PT Comments    Patient is agreeable to PT and motivated to participate. Supportive mother present again today. The patient had increased ambulation distance this session with 2 bouts of 41f walked with assistance using bariatric rolling walker and chair follow for safety. Tips for energy conservation, taking breaks as needed. PMSV in place throughout session today and Sp02 93% or higher on 6L02. Heart rate up to 120's with walking. Recommend to continue PT to maximize independence and facilitate return to prior level of function. Continue to recommend acute inpatient rehab at discharge.    Recommendations for follow up therapy are one component of a multi-disciplinary discharge planning process, led by the attending physician.  Recommendations may be updated based on patient status, additional functional criteria and insurance authorization.  Follow Up Recommendations  Acute inpatient rehab (3hours/day)     Assistance Recommended at Discharge Intermittent Supervision/Assistance  Patient can return home with the following A little help with walking and/or transfers;A little help with bathing/dressing/bathroom;Help with stairs or ramp for entrance;Assist for transportation   Equipment Recommendations   (to be determined at next venue of care)    Recommendations for Other Services       Precautions / Restrictions Precautions Precautions: Fall Restrictions Weight Bearing Restrictions: No     Mobility  Bed Mobility               General bed mobility comments: patient sitting up on  arrival and post session    Transfers Overall transfer level: Needs assistance Equipment used: Rolling walker (2 wheels) Transfers: Sit to/from Stand Sit to Stand: Min assist, +2 physical assistance           General transfer comment: verbal cues for hand placement and anterior weight shifting    Ambulation/Gait Ambulation/Gait assistance: Min assist, +2 safety/equipment (second person for chair follow) Gait Distance (Feet): 40 Feet (x 2 bouts) Assistive device: Rolling walker (2 wheels) (bariatric rolling walker) Gait Pattern/deviations: Step-to pattern, Decreased stance time - left Gait velocity: decreased     General Gait Details: steadying assistance provided with cues for sequencing of BLE and rolling walker and offloading LLE for safety due to continued leg numbness. seated rest break requried between bouts of walking. PMSV in place and patient ambulating while on trach mask oxygen, Fi0235%, 6 L02. Sp02 93% or higher with heart rate up to 120's   Stairs             Wheelchair Mobility    Modified Rankin (Stroke Patients Only)       Balance Overall balance assessment: Needs assistance Sitting-balance support: Feet supported Sitting balance-Leahy Scale: Good     Standing balance support: Bilateral upper extremity supported Standing balance-Leahy Scale: Fair                              Cognition Arousal/Alertness: Awake/alert Behavior During Therapy: WFL for tasks assessed/performed Overall Cognitive Status: Within Functional Limits for tasks assessed  General Comments: PMSV in place. patient confirms that she lives with her children with 2-3 steps to enter her home with no rails. her mother can assist her at discharge.        Exercises      General Comments        Pertinent Vitals/Pain Pain Assessment Pain Assessment: No/denies pain    Home Living                           Prior Function            PT Goals (current goals can now be found in the care plan section) Acute Rehab PT Goals Patient Stated Goal: to walk, go home PT Goal Formulation: With patient Time For Goal Achievement: 07/10/22 Potential to Achieve Goals: Good Progress towards PT goals: Progressing toward goals    Frequency    Min 2X/week      PT Plan Current plan remains appropriate    Co-evaluation PT/OT/SLP Co-Evaluation/Treatment: Yes Reason for Co-Treatment: Complexity of the patient's impairments (multi-system involvement);For patient/therapist safety;To address functional/ADL transfers PT goals addressed during session: Mobility/safety with mobility OT goals addressed during session: Proper use of Adaptive equipment and DME;ADL's and self-care      AM-PAC PT "6 Clicks" Mobility   Outcome Measure  Help needed turning from your back to your side while in a flat bed without using bedrails?: A Lot Help needed moving from lying on your back to sitting on the side of a flat bed without using bedrails?: A Lot Help needed moving to and from a bed to a chair (including a wheelchair)?: A Lot Help needed standing up from a chair using your arms (e.g., wheelchair or bedside chair)?: A Little Help needed to walk in hospital room?: A Lot Help needed climbing 3-5 steps with a railing? : A Lot 6 Click Score: 13    End of Session Equipment Utilized During Treatment: Oxygen Activity Tolerance: Patient tolerated treatment well Patient left: in chair;with call bell/phone within reach;with family/visitor present Nurse Communication: Mobility status PT Visit Diagnosis: Unsteadiness on feet (R26.81);Muscle weakness (generalized) (M62.81)     Time: [id number] PT Time Calculation (min) (ACUTE ONLY): 28 min  Charges:  $Therapeutic Activity: 8-22 mins                     Desiree Stone, PT, MPT    Desiree Stone 07/04/2022, 1:25 PM

## 2022-07-04 NOTE — TOC Progression Note (Signed)
Transition of Care [facility]) - Progression Note    Patient Details  Name: Desiree Stone MRN: [id number] Date of Birth: 05/08/1976  [facility] CM/SW Contact  Laurena Slimmer, RN Phone Number: 07/04/2022, 1:24 PM  Clinical Narrative:    Case reviewed for needs and disposition. Patient accepted for CIR. Will transfer when bed available per CIR coordinator.    Expected Discharge Plan: IP Rehab Facility Barriers to Discharge: Continued Medical Work up  Expected Discharge Plan and Services   Discharge Planning Services: CM Consult Post Acute Care Choice: IP Rehab Living arrangements for the past 2 months: Single Family Home                                       Social Determinants of Health (SDOH) Interventions SDOH Screenings   Food Insecurity: No Food Insecurity (06/17/2022)  Housing: Low Risk  (06/17/2022)  Transportation Needs: No Transportation Needs (06/17/2022)  Utilities: Not At Risk (06/17/2022)  Physical Activity: Sufficiently Active (05/23/2017)  Social Connections: Moderately Isolated (05/23/2017)  Stress: No Stress Concern Present (05/23/2017)  Tobacco Use: Medium Risk (06/20/2022)    Readmission Risk Interventions     No data to display

## 2022-07-04 NOTE — Progress Notes (Cosign Needed Addendum)
Speech Language Pathology Treatment: Nada Boozer Speaking valve  Patient Details Name: Desiree Stone MRN: [id number] DOB: September 08, 1975 Today's Date: 07/04/2022 Time: [id number] SLP Time Calculation (min) (ACUTE ONLY): 30 min  Assessment / Plan / Recommendation    Clinical Impression  Pt seen today for PMSV treatment. Pt sitting up on side of bed, holding onto walker for support, w/ PMSV donned upon SLP entrance. Pt appeared alert, motivated, and cooperative throughout treatment. Pt left sitting up on side of bed w/ family member present and RN notified to help pt to bathroom.  Pt on 8L O2 via trach collar; FiO2 35%; afebrile; WBC slightly elevated.  Pt reported wearing trach collar for nearly two hours prior to SLP arrival. Pt tolerating PMSV well c/b stable/calm respiratory rate (unlabored) and NO overt declined respiratory presentation such as panting or wheezing. Pt engaged in conversation for several minutes without overt SOB. Noted intermittent coughing throughout treatment session and pt report of "tickle" in her throat. Suspect increased airflow and phlegm contributing to this presentation. Pt reported she has not noticed SOB or apparent difficulties breathing. Pt also reported being up all night long due to frequently coughing. Noted pt's vocal quality min/mod hoarse/gravelly.  Pt educated on strategies for functional vocal quality including minimizing background noise (closing door, muting television), close proximity to communication partner and within line of sight, and speaking w/ normal/typical vocal quality rather than whispering to reduce vocal strain. Pt appreciative and exhibited verbal comprehension of these strategies.  Acute ST services will continue to f/u in next 1-2 days for PMSV tolerance and education. Pt/RN updated and agreed.     HPI HPI: Pt is a 47 year old female admitted with acute hypoxic and hypercapnic respiratory failure in the setting of acute COPD  exacerbation, new onset CHF, Obesity, ongoing tobacco use, and suspected community-acquired pneumonia.  She endorsed that over the last several months, she has been experiencing gradually worsening shortness of breath and dyspnea on exertion in addition to orthopnea. In addition, she endorses a productive cough with thick sputum and palpitations.  Post admission, she required intubation and mechanical ventilation d/t ongoing Pulmonary decline.  Pt was orally intubated from 06/08/22-06/19/22 when tracheostomy was performed. Pt s/p tracheostomy; NGT remains in place.   CXR on 06/22/22: Bilateral mid and lower lung interstitial thickening with  left-greater-than-right basilar heterogeneous airspace opacities,  similar to prior. Findings could represent atelectasis or pneumonia.  3. Small left pleural effusion.      SLP Plan  Continue with current plan of care      Recommendations for follow up therapy are one component of a multi-disciplinary discharge planning process, led by the attending physician.  Recommendations may be updated based on patient status, additional functional criteria and insurance authorization.    Recommendations         Patient may use Passy-Muir Speech Valve: During all waking hours (remove during sleep) PMSV Supervision: Intermittent Support w/ donning/doffing PMSV and care of PMSV         Oral Care Recommendations: Oral care QID;Oral care prior to ice chip/H20 Follow Up Recommendations: Skilled nursing-short term rehab (<3 hours/day) Assistance recommended at discharge: Frequent or constant Supervision/Assistance SLP Visit Diagnosis: Aphonia (R49.1) Plan: Continue with current plan of care          Randall Hiss Graduate Clinician [place], Speech Pathology   Randall Hiss  07/04/2022, 12:58 PM

## 2022-07-04 NOTE — Progress Notes (Signed)
Nutrition Follow-up  DOCUMENTATION CODES:   Morbid obesity  INTERVENTION:   -TF via NGT:   Glucerna 1.5 @ 55 ml/hr  135 ml free water flush every 4 hours  Tube feeding regimen provides 1980 kcal (100% of needs), 109 grams of protein, and 1002 ml of H2O. Total free water: 1812 ml daily    -RD will follow for diet advancement and add supplements/ adjust TF regimen as appropriate  NUTRITION DIAGNOSIS:   Inadequate oral intake related to inability to eat (pt sedated and ventilated) as evidenced by NPO status.  Ongoing  GOAL:   Patient will meet greater than or equal to 90% of their needs  Met with TF  MONITOR:   Diet advancement, Labs, Weight trends, TF tolerance, I & O's, Skin  REASON FOR ASSESSMENT:   Ventilator    ASSESSMENT:   47 y/o female with h/o OSA, HTN, DM, asthma, morbid obesity and uterine fibroid who is admitted with new COPD and CHF. Pt with new PNA.  1/29- s/p trach 2/9- NGT occluded 2/10- NGT replaced; position verified by x-ray 2/15- trach downsized to 6 cuffless  Reviewed I/O's: -293 ml x 24 hours and +9.9 L since 06/20/22  UOP: 400 ml x 24 hours   Case discussed with SLP; trach was downsized yesterday. Plan for PSMV speaking trials today and possible diet advancement tomorrow.   Case discussed with RN, who reports pt has PSMV now and on trach collar. Pt tolerating TF well, but eager to eat.   Spoke with pt at bedside, who was pleasant and in good spirits today. She shares that she continues to tolerate TF well. She is eager to eat; RD updated pt on plan of care and purpose of continuing NGT feedings for now. Pt understanding and expressed appreciation for visit.   Reviewed wt hx; pt has experienced a 4% wt loss over the past week, which is significant for time frame. Suspect some wt loss may be related to diuresis.   Medications reviewed and include lasix  Labs reviewed: K: 3.4, CBGS: 134-173 (inpatient orders for glycemic control are 0-9  units insulin aspart every 4 hours, 7 units insulin aspart every 4 hours, ad 30 units insulin glargine-yfgn BID).    Diet Order:   Diet Order             Diet NPO time specified  Diet effective midnight                   EDUCATION NEEDS:   No education needs have been identified at this time  Skin:  Skin Assessment: Reviewed RN Assessment (MASD buttocks, incision neck) Skin Integrity Issues:: Incisions Incisions: closed neck s/p trach placement  Last BM:  07/04/22 (type 7)  Height:   Ht Readings from Last 1 Encounters:  06/08/22 4' 11"$  (1.499 m)    Weight:   Wt Readings from Last 1 Encounters:  07/04/22 129.6 kg    Ideal Body Weight:  44.5 kg  BMI:  Body mass index is 57.72 kg/m.  Estimated Nutritional Needs:   Kcal:  1800-2000  Protein:  95-110 grams  Fluid:  > 1.8 L    [name], RD, LDN, [place] Registered Dietitian II Certified Diabetes Care and Education Specialist Please refer to [facility] for RD and/or RD on-call/weekend/after hours pager

## 2022-07-05 ENCOUNTER — Encounter: Payer: Self-pay | Admitting: Pulmonary Disease

## 2022-07-05 ENCOUNTER — Other Ambulatory Visit: Payer: Self-pay

## 2022-07-05 ENCOUNTER — Encounter (HOSPITAL_COMMUNITY): Payer: Self-pay | Admitting: Physical Medicine & Rehabilitation

## 2022-07-05 ENCOUNTER — Inpatient Hospital Stay (HOSPITAL_COMMUNITY)
Admission: RE | Admit: 2022-07-05 | Discharge: 2022-07-13 | DRG: 945 | Disposition: A | Discharge status: Home / Self Care | Payer: Medicaid Other | Source: Other Acute Inpatient Hospital | Attending: Physical Medicine & Rehabilitation | Admitting: Physical Medicine & Rehabilitation

## 2022-07-05 DIAGNOSIS — Z8701 Personal history of pneumonia (recurrent): Secondary | ICD-10-CM

## 2022-07-05 DIAGNOSIS — R1310 Dysphagia, unspecified: Secondary | ICD-10-CM | POA: Diagnosis present

## 2022-07-05 DIAGNOSIS — E785 Hyperlipidemia, unspecified: Secondary | ICD-10-CM | POA: Diagnosis present

## 2022-07-05 DIAGNOSIS — F419 Anxiety disorder, unspecified: Secondary | ICD-10-CM | POA: Diagnosis present

## 2022-07-05 DIAGNOSIS — J9622 Acute and chronic respiratory failure with hypercapnia: Secondary | ICD-10-CM

## 2022-07-05 DIAGNOSIS — J9601 Acute respiratory failure with hypoxia: Secondary | ICD-10-CM | POA: Diagnosis not present

## 2022-07-05 DIAGNOSIS — J441 Chronic obstructive pulmonary disease with (acute) exacerbation: Secondary | ICD-10-CM | POA: Diagnosis present

## 2022-07-05 DIAGNOSIS — F17200 Nicotine dependence, unspecified, uncomplicated: Secondary | ICD-10-CM

## 2022-07-05 DIAGNOSIS — F17210 Nicotine dependence, cigarettes, uncomplicated: Secondary | ICD-10-CM | POA: Diagnosis present

## 2022-07-05 DIAGNOSIS — R5383 Other fatigue: Secondary | ICD-10-CM | POA: Diagnosis present

## 2022-07-05 DIAGNOSIS — I2781 Cor pulmonale (chronic): Secondary | ICD-10-CM | POA: Diagnosis present

## 2022-07-05 DIAGNOSIS — F32A Depression, unspecified: Secondary | ICD-10-CM | POA: Diagnosis present

## 2022-07-05 DIAGNOSIS — J449 Chronic obstructive pulmonary disease, unspecified: Secondary | ICD-10-CM | POA: Insufficient documentation

## 2022-07-05 DIAGNOSIS — R4589 Other symptoms and signs involving emotional state: Secondary | ICD-10-CM

## 2022-07-05 DIAGNOSIS — D509 Iron deficiency anemia, unspecified: Secondary | ICD-10-CM | POA: Diagnosis present

## 2022-07-05 DIAGNOSIS — R200 Anesthesia of skin: Secondary | ICD-10-CM | POA: Diagnosis present

## 2022-07-05 DIAGNOSIS — Z803 Family history of malignant neoplasm of breast: Secondary | ICD-10-CM

## 2022-07-05 DIAGNOSIS — D72829 Elevated white blood cell count, unspecified: Secondary | ICD-10-CM | POA: Diagnosis present

## 2022-07-05 DIAGNOSIS — E876 Hypokalemia: Secondary | ICD-10-CM | POA: Diagnosis present

## 2022-07-05 DIAGNOSIS — R5381 Other malaise: Principal | ICD-10-CM | POA: Diagnosis present

## 2022-07-05 DIAGNOSIS — J45909 Unspecified asthma, uncomplicated: Secondary | ICD-10-CM | POA: Diagnosis present

## 2022-07-05 DIAGNOSIS — I5031 Acute diastolic (congestive) heart failure: Secondary | ICD-10-CM | POA: Diagnosis present

## 2022-07-05 DIAGNOSIS — I509 Heart failure, unspecified: Secondary | ICD-10-CM

## 2022-07-05 DIAGNOSIS — Z6841 Body Mass Index (BMI) 40.0 and over, adult: Secondary | ICD-10-CM

## 2022-07-05 DIAGNOSIS — Z79899 Other long term (current) drug therapy: Secondary | ICD-10-CM

## 2022-07-05 DIAGNOSIS — E871 Hypo-osmolality and hyponatremia: Secondary | ICD-10-CM | POA: Diagnosis present

## 2022-07-05 DIAGNOSIS — M25552 Pain in left hip: Secondary | ICD-10-CM | POA: Diagnosis present

## 2022-07-05 DIAGNOSIS — R052 Subacute cough: Secondary | ICD-10-CM | POA: Diagnosis present

## 2022-07-05 DIAGNOSIS — J9611 Chronic respiratory failure with hypoxia: Secondary | ICD-10-CM | POA: Diagnosis present

## 2022-07-05 DIAGNOSIS — I10 Essential (primary) hypertension: Secondary | ICD-10-CM | POA: Diagnosis present

## 2022-07-05 DIAGNOSIS — E1169 Type 2 diabetes mellitus with other specified complication: Secondary | ICD-10-CM

## 2022-07-05 DIAGNOSIS — Z7985 Long-term (current) use of injectable non-insulin antidiabetic drugs: Secondary | ICD-10-CM

## 2022-07-05 DIAGNOSIS — Z7984 Long term (current) use of oral hypoglycemic drugs: Secondary | ICD-10-CM

## 2022-07-05 DIAGNOSIS — I110 Hypertensive heart disease with heart failure: Secondary | ICD-10-CM | POA: Diagnosis present

## 2022-07-05 DIAGNOSIS — G8929 Other chronic pain: Secondary | ICD-10-CM | POA: Diagnosis present

## 2022-07-05 DIAGNOSIS — E872 Acidosis, unspecified: Secondary | ICD-10-CM | POA: Diagnosis present

## 2022-07-05 DIAGNOSIS — J9602 Acute respiratory failure with hypercapnia: Secondary | ICD-10-CM | POA: Diagnosis not present

## 2022-07-05 DIAGNOSIS — M549 Dorsalgia, unspecified: Secondary | ICD-10-CM | POA: Diagnosis present

## 2022-07-05 DIAGNOSIS — E1165 Type 2 diabetes mellitus with hyperglycemia: Secondary | ICD-10-CM | POA: Diagnosis present

## 2022-07-05 DIAGNOSIS — E119 Type 2 diabetes mellitus without complications: Secondary | ICD-10-CM | POA: Diagnosis present

## 2022-07-05 DIAGNOSIS — Z888 Allergy status to other drugs, medicaments and biological substances status: Secondary | ICD-10-CM

## 2022-07-05 DIAGNOSIS — Z430 Encounter for attention to tracheostomy: Secondary | ICD-10-CM | POA: Diagnosis not present

## 2022-07-05 DIAGNOSIS — R531 Weakness: Secondary | ICD-10-CM | POA: Diagnosis present

## 2022-07-05 DIAGNOSIS — J9612 Chronic respiratory failure with hypercapnia: Secondary | ICD-10-CM | POA: Diagnosis present

## 2022-07-05 DIAGNOSIS — G4733 Obstructive sleep apnea (adult) (pediatric): Secondary | ICD-10-CM

## 2022-07-05 DIAGNOSIS — Z7951 Long term (current) use of inhaled steroids: Secondary | ICD-10-CM

## 2022-07-05 DIAGNOSIS — R490 Dysphonia: Secondary | ICD-10-CM | POA: Diagnosis present

## 2022-07-05 DIAGNOSIS — E662 Morbid (severe) obesity with alveolar hypoventilation: Secondary | ICD-10-CM | POA: Diagnosis present

## 2022-07-05 DIAGNOSIS — Z91199 Patient's noncompliance with other medical treatment and regimen due to unspecified reason: Secondary | ICD-10-CM

## 2022-07-05 LAB — GLUCOSE, CAPILLARY
Glucose-Capillary: 107 mg/dL — ABNORMAL HIGH (ref 70–99)
Glucose-Capillary: 110 mg/dL — ABNORMAL HIGH (ref 70–99)
Glucose-Capillary: 221 mg/dL — ABNORMAL HIGH (ref 70–99)
Glucose-Capillary: 190 mg/dL — ABNORMAL HIGH (ref 70–99)
Glucose-Capillary: 180 mg/dL — ABNORMAL HIGH (ref 70–99)
Glucose-Capillary: 139 mg/dL — ABNORMAL HIGH (ref 70–99)

## 2022-07-05 MED ORDER — INSULIN GLARGINE-YFGN 100 UNIT/ML ~~LOC~~ SOLN
30.0000 [IU] | Freq: Two times a day (BID) | SUBCUTANEOUS | Status: DC
  Administered 2022-07-05 – 2022-07-13 (×16): 30 [IU] via SUBCUTANEOUS
  Filled 2022-07-05 (×17): qty 0.3

## 2022-07-05 MED ORDER — GLUCERNA 1.5 CAL PO LIQD
660.0000 mL | ORAL | Status: DC
  Filled 2022-07-05: qty 711

## 2022-07-05 MED ORDER — FUROSEMIDE 40 MG PO TABS
40.0000 mg | ORAL_TABLET | Freq: Every day | ORAL | Status: DC
  Administered 2022-07-06 – 2022-07-13 (×8): 40 mg via ORAL
  Filled 2022-07-05 (×8): qty 1

## 2022-07-05 MED ORDER — PROCHLORPERAZINE 25 MG RE SUPP: 12.5000 mg | Freq: Four times a day (QID) | RECTAL | Status: DC | PRN

## 2022-07-05 MED ORDER — SENNOSIDES-DOCUSATE SODIUM 8.6-50 MG PO TABS: 2.0000 | ORAL_TABLET | Freq: Every evening | ORAL | Status: DC | PRN

## 2022-07-05 MED ORDER — MAGIC MOUTHWASH W/LIDOCAINE: 5.0000 mL | Freq: Three times a day (TID) | ORAL | Status: DC | PRN

## 2022-07-05 MED ORDER — ORAL CARE MOUTH RINSE: 15.0000 mL | OROMUCOSAL | Status: DC | PRN

## 2022-07-05 MED ORDER — SODIUM CHLORIDE 0.9% FLUSH
3.0000 mL | Freq: Two times a day (BID) | INTRAVENOUS | Status: DC
  Administered 2022-07-05 – 2022-07-07 (×2): 3 mL via INTRAVENOUS

## 2022-07-05 MED ORDER — POLYETHYLENE GLYCOL 3350 17 G PO PACK: 17.0000 g | PACK | Freq: Every day | ORAL | Status: DC | PRN

## 2022-07-05 MED ORDER — FAMOTIDINE 20 MG PO TABS: 20.0000 mg | ORAL_TABLET | Freq: Two times a day (BID) | ORAL | Status: DC

## 2022-07-05 MED ORDER — ENOXAPARIN SODIUM 80 MG/0.8ML IJ SOSY
65.0000 mg | PREFILLED_SYRINGE | INTRAMUSCULAR | Status: DC
  Administered 2022-07-05 – 2022-07-12 (×8): 65 mg via SUBCUTANEOUS
  Filled 2022-07-05 (×8): qty 0.8

## 2022-07-05 MED ORDER — PROCHLORPERAZINE MALEATE 5 MG PO TABS: 5.0000 mg | ORAL_TABLET | Freq: Four times a day (QID) | ORAL | Status: DC | PRN

## 2022-07-05 MED ORDER — ATORVASTATIN CALCIUM 40 MG PO TABS
40.0000 mg | ORAL_TABLET | Freq: Every day | ORAL | Status: DC
  Administered 2022-07-05 – 2022-07-12 (×8): 40 mg via ORAL
  Filled 2022-07-05 (×8): qty 1

## 2022-07-05 MED ORDER — FLEET ENEMA 7-19 GM/118ML RE ENEM: 1.0000 | ENEMA | Freq: Once | RECTAL | Status: DC | PRN

## 2022-07-05 MED ORDER — FUROSEMIDE 40 MG PO TABS: 40.0000 mg | ORAL_TABLET | Freq: Every day | ORAL | Status: DC

## 2022-07-05 MED ORDER — TRAZODONE HCL 50 MG PO TABS: 25.0000 mg | ORAL_TABLET | Freq: Every evening | ORAL | Status: DC | PRN

## 2022-07-05 MED ORDER — OXYCODONE HCL 5 MG PO TABS: 10.0000 mg | ORAL_TABLET | Freq: Four times a day (QID) | ORAL | Status: DC | PRN

## 2022-07-05 MED ORDER — ATORVASTATIN CALCIUM 40 MG PO TABS: 40.0000 mg | ORAL_TABLET | Freq: Every day | ORAL | Status: DC

## 2022-07-05 MED ORDER — DIPHENHYDRAMINE HCL 12.5 MG/5ML PO ELIX: 12.5000 mg | ORAL_SOLUTION | Freq: Four times a day (QID) | ORAL | Status: DC | PRN

## 2022-07-05 MED ORDER — PROCHLORPERAZINE EDISYLATE 10 MG/2ML IJ SOLN: 5.0000 mg | Freq: Four times a day (QID) | INTRAMUSCULAR | Status: DC | PRN

## 2022-07-05 MED ORDER — ALUM & MAG HYDROXIDE-SIMETH 200-200-20 MG/5ML PO SUSP: 30.0000 mL | ORAL | Status: DC | PRN

## 2022-07-05 MED ORDER — OXYCODONE HCL 5 MG PO TABS
10.0000 mg | ORAL_TABLET | Freq: Four times a day (QID) | ORAL | Status: DC | PRN
  Administered 2022-07-05 – 2022-07-12 (×11): 10 mg via ORAL
  Filled 2022-07-05 (×11): qty 2

## 2022-07-05 MED ORDER — INSULIN ASPART 100 UNIT/ML IJ SOLN
0.0000 [IU] | Freq: Three times a day (TID) | INTRAMUSCULAR | Status: DC
  Administered 2022-07-05: 2 [IU] via SUBCUTANEOUS
  Administered 2022-07-06: 3 [IU] via SUBCUTANEOUS
  Administered 2022-07-06 – 2022-07-11 (×8): 1 [IU] via SUBCUTANEOUS
  Administered 2022-07-11: 2 [IU] via SUBCUTANEOUS
  Administered 2022-07-12: 1 [IU] via SUBCUTANEOUS

## 2022-07-05 MED ORDER — IPRATROPIUM-ALBUTEROL 0.5-2.5 (3) MG/3ML IN SOLN
3.0000 mL | RESPIRATORY_TRACT | Status: DC | PRN
  Administered 2022-07-08: 3 mL via RESPIRATORY_TRACT
  Filled 2022-07-05: qty 3

## 2022-07-05 MED ORDER — LOPERAMIDE HCL 1 MG/7.5ML PO SUSP: 4.0000 mg | ORAL | Status: DC | PRN

## 2022-07-05 MED ORDER — GERHARDT'S BUTT CREAM: TOPICAL_CREAM | Freq: Four times a day (QID) | CUTANEOUS | Status: DC | PRN

## 2022-07-05 MED ORDER — ACETAMINOPHEN 325 MG PO TABS
325.0000 mg | ORAL_TABLET | ORAL | Status: DC | PRN
  Administered 2022-07-11 – 2022-07-13 (×3): 650 mg via ORAL
  Filled 2022-07-05 (×4): qty 2

## 2022-07-05 MED ORDER — FAMOTIDINE 20 MG PO TABS
20.0000 mg | ORAL_TABLET | Freq: Two times a day (BID) | ORAL | Status: DC
  Administered 2022-07-05 – 2022-07-13 (×16): 20 mg via ORAL
  Filled 2022-07-05 (×16): qty 1

## 2022-07-05 MED ORDER — EXERCISE FOR HEART AND HEALTH BOOK
Freq: Once | Status: AC
  Filled 2022-07-05: qty 1

## 2022-07-05 MED ORDER — CHLORHEXIDINE GLUCONATE CLOTH 2 % EX PADS
6.0000 | MEDICATED_PAD | Freq: Every day | CUTANEOUS | Status: DC
  Administered 2022-07-06: 6 via TOPICAL

## 2022-07-05 MED ORDER — ACETAMINOPHEN 325 MG PO TABS: 650.0000 mg | ORAL_TABLET | ORAL | Status: DC | PRN

## 2022-07-05 MED ORDER — IPRATROPIUM-ALBUTEROL 0.5-2.5 (3) MG/3ML IN SOLN
3.0000 mL | Freq: Two times a day (BID) | RESPIRATORY_TRACT | Status: DC
  Administered 2022-07-06 – 2022-07-09 (×8): 3 mL via RESPIRATORY_TRACT
  Filled 2022-07-05 (×6): qty 3

## 2022-07-05 MED ORDER — MELATONIN 5 MG PO TABS: 5.0000 mg | ORAL_TABLET | Freq: Every evening | ORAL | Status: DC | PRN

## 2022-07-05 MED ORDER — ACETAMINOPHEN 650 MG RE SUPP: 650.0000 mg | Freq: Four times a day (QID) | RECTAL | Status: DC | PRN

## 2022-07-05 MED ORDER — BISACODYL 10 MG RE SUPP: 10.0000 mg | Freq: Every day | RECTAL | Status: DC | PRN

## 2022-07-05 MED ORDER — INSULIN GLARGINE-YFGN 100 UNIT/ML ~~LOC~~ SOLN: 30.0000 [IU] | Freq: Two times a day (BID) | SUBCUTANEOUS | Status: DC

## 2022-07-05 MED ORDER — SERTRALINE HCL 50 MG PO TABS
50.0000 mg | ORAL_TABLET | Freq: Every day | ORAL | Status: DC
  Administered 2022-07-05 – 2022-07-12 (×8): 50 mg via ORAL
  Filled 2022-07-05 (×10): qty 1

## 2022-07-05 MED ORDER — FREE WATER: 100.0000 mL | Freq: Three times a day (TID) | Status: DC

## 2022-07-05 MED ORDER — SERTRALINE HCL 50 MG PO TABS: 50.0000 mg | ORAL_TABLET | Freq: Every day | ORAL | Status: DC

## 2022-07-05 MED ORDER — INSULIN ASPART 100 UNIT/ML IJ SOLN
7.0000 [IU] | Freq: Three times a day (TID) | INTRAMUSCULAR | Status: DC
  Administered 2022-07-06 – 2022-07-13 (×22): 7 [IU] via SUBCUTANEOUS

## 2022-07-05 MED ORDER — GUAIFENESIN-DM 100-10 MG/5ML PO SYRP
5.0000 mL | ORAL_SOLUTION | Freq: Four times a day (QID) | ORAL | Status: DC | PRN
  Administered 2022-07-05 – 2022-07-06 (×2): 10 mL via ORAL
  Filled 2022-07-05 (×2): qty 10

## 2022-07-05 MED ORDER — ATORVASTATIN CALCIUM 20 MG PO TABS: 40.0000 mg | ORAL_TABLET | Freq: Every day | ORAL | Status: DC

## 2022-07-05 MED ORDER — INSULIN ASPART 100 UNIT/ML IJ SOLN: 7.0000 [IU] | Freq: Three times a day (TID) | INTRAMUSCULAR | Status: DC

## 2022-07-05 NOTE — Progress Notes (Signed)
Inpatient Rehab Admissions Coordinator:   Pt going to room 4w10 at [facility].  [facility] arranged for 2pm pickup.  RN can call for report at [phone].    Shann Medal, PT, DPT Admissions Coordinator [phone] 07/05/22  11:36 AM

## 2022-07-05 NOTE — Progress Notes (Signed)
Occupational Therapy Treatment Patient Details Name: Desiree Stone MRN: [id number] DOB: 22-Jul-1975 Today's Date: 07/05/2022   History of present illness Patient is a 47 year old female with acute hypoxic and hypercapnic respiratory failure, acute COPD exacerbation, new onset CHF, and suspected community-acquired pneumonia requiring intubation and mechanical ventilation. History of tobacco use   OT comments  Ms Ludwick was seen for OT treatment on this date. Upon arrival to room pt seated EOB, agreeable to tx. Pt requires MIN A + HHA simulated BSC t/f. MIN A tooth brushing standing sink side. Tolerated functional reaching standing sink side. Pt making good progress toward goals, will continue to follow POC. Discharge recommendation remains appropriate.     Recommendations for follow up therapy are one component of a multi-disciplinary discharge planning process, led by the attending physician.  Recommendations may be updated based on patient status, additional functional criteria and insurance authorization.    Follow Up Recommendations  Acute inpatient rehab (3hours/day)     Assistance Recommended at Discharge Frequent or constant Supervision/Assistance  Patient can return home with the following  Two people to help with walking and/or transfers;A lot of help with bathing/dressing/bathroom;Assist for transportation;Assistance with cooking/housework;Help with stairs or ramp for entrance   Equipment Recommendations  Other (comment)    Recommendations for Other Services      Precautions / Restrictions Precautions Precautions: Fall Restrictions Weight Bearing Restrictions: No       Mobility Bed Mobility               General bed mobility comments: recieved/left sitting    Transfers Overall transfer level: Needs assistance Equipment used: None Transfers: Sit to/from Stand Sit to Stand: Min guard           General transfer comment: MIN A as pt fatigues, completes  x5 stands     Balance Overall balance assessment: Needs assistance Sitting-balance support: Feet supported Sitting balance-Leahy Scale: Good     Standing balance support: Single extremity supported Standing balance-Leahy Scale: Fair                             ADL either performed or assessed with clinical judgement   ADL Overall ADL's : Needs assistance/impaired                                       General ADL Comments: MIN A + HHA simulated BSC t/f. MIN A tooth brushing standing sink side      Cognition Arousal/Alertness: Awake/alert Behavior During Therapy: WFL for tasks assessed/performed Overall Cognitive Status: Within Functional Limits for tasks assessed                                                     Pertinent Vitals/ Pain       Pain Assessment Pain Assessment: No/denies pain   Frequency  Min 3X/week        Progress Toward Goals  OT Goals(current goals can now be found in the care plan section)  Progress towards OT goals: Progressing toward goals  Acute Rehab OT Goals Patient Stated Goal: to return to work OT Goal Formulation: With patient Time For Goal Achievement: 07/10/22 Potential to Achieve Goals: Good ADL Goals  Pt Will Perform Grooming: with min assist;standing Pt Will Perform Lower Body Dressing: with min assist;sit to/from stand Pt Will Transfer to Toilet: with min assist;ambulating Pt Will Perform Toileting - Clothing Manipulation and hygiene: with min assist;sit to/from stand  Plan Discharge plan remains appropriate;Frequency remains appropriate    Co-evaluation                 AM-PAC OT "6 Clicks" Daily Activity     Outcome Measure   Help from another person eating meals?: None Help from another person taking care of personal grooming?: None Help from another person toileting, which includes using toliet, bedpan, or urinal?: A Lot Help from another person bathing  (including washing, rinsing, drying)?: A Lot Help from another person to put on and taking off regular upper body clothing?: A Little Help from another person to put on and taking off regular lower body clothing?: A Lot 6 Click Score: 17    End of Session    OT Visit Diagnosis: Unsteadiness on feet (R26.81);Repeated falls (R29.6);Muscle weakness (generalized) (M62.81)   Activity Tolerance Patient tolerated treatment well   Patient Left in bed;with call bell/phone within reach;with family/visitor present   Nurse Communication          Time: [id number] OT Time Calculation (min): 26 min  Charges: OT General Charges $OT Visit: 1 Visit OT Treatments $Self Care/Home Management : 8-22 mins $Therapeutic Activity: 8-22 mins  Dessie Coma, M.S. OTR/L  07/05/22, 12:40 PM  ascom [phone]

## 2022-07-05 NOTE — Progress Notes (Addendum)
Inpatient Rehab Admissions Coordinator:   I have a bed for this patient to admit to CIR today.  Awaiting confirmation from Dr. Mal Misty that pt is ready.    11:19: Dr. Mal Misty confirmed pt ready to admit to CIR.  Will let TOC, pt/family know.   Shann Medal, PT, DPT Admissions Coordinator [phone] 07/05/22  10:16 AM

## 2022-07-05 NOTE — Progress Notes (Signed)
Physical Therapy Treatment Patient Details Name: ELIANNE HANEK MRN: [id number] DOB: 1975-11-21 Today's Date: 07/05/2022   History of Present Illness Patient is a 47 year old female with acute hypoxic and hypercapnic respiratory failure, acute COPD exacerbation, new onset CHF, and suspected community-acquired pneumonia requiring intubation and mechanical ventilation. History of tobacco use   PT Comments    Patient just finished with OT and is excited but nervous to discharge to rehab. The continues to report left leg numbness that worsens with prolonged sitting. Educated patient on weight shifting techniques to perform in sitting which patient demonstrated independently with no loss of balance. Therapeutic exercises performed in sitting for strengthening of L leg. Discharge to CIR anticipated this afternoon.    Recommendations for follow up therapy are one component of a multi-disciplinary discharge planning process, led by the attending physician.  Recommendations may be updated based on patient status, additional functional criteria and insurance authorization.  Follow Up Recommendations  Acute inpatient rehab (3hours/day)     Assistance Recommended at Discharge Intermittent Supervision/Assistance  Patient can return home with the following A little help with walking and/or transfers;A little help with bathing/dressing/bathroom;Help with stairs or ramp for entrance;Assist for transportation   Equipment Recommendations  Other (comment) (to be determined at next level of care)    Recommendations for Other Services       Precautions / Restrictions Precautions Precautions: Fall Restrictions Weight Bearing Restrictions: No     Mobility  Bed Mobility               General bed mobility comments: not assessed as patient sitting on edge of bed throughout session    Transfers                   General transfer comment: patient complains of LLE numbness and feels it  could be related to low back issues. educated patient on weight shifting in sitting, routine repositioning techniques as patient reports sitting too long in the same position causes increased numbness in left leg. patient is able to weight shifting side to side and anteriorly independently with no loss of balance and with no UE support    Ambulation/Gait                   Stairs             Wheelchair Mobility    Modified Rankin (Stroke Patients Only)       Balance Overall balance assessment: Needs assistance Sitting-balance support: Feet supported, No upper extremity supported Sitting balance-Leahy Scale: Good                                      Cognition Arousal/Alertness: Awake/alert Behavior During Therapy: WFL for tasks assessed/performed Overall Cognitive Status: Within Functional Limits for tasks assessed                                 General Comments: PMSV in place throughout        Exercises General Exercises - Lower Extremity Long Arc Quad: AROM, Strengthening, Left, 5 reps, Seated, Other (comment) Hip Flexion/Marching: AROM, Strengthening, Left, 5 reps, Seated Other Exercises Other Exercises: exercises performed for strengthening with cues for technique    General Comments General comments (skin integrity, edema, etc.): patient has lots of questions about rehab and what to expect.  discharge now anticipated for today      Pertinent Vitals/Pain Pain Assessment Pain Assessment: No/denies pain    Home Living                          Prior Function            PT Goals (current goals can now be found in the care plan section) Acute Rehab PT Goals Patient Stated Goal: to walk, go home PT Goal Formulation: With patient Time For Goal Achievement: 07/10/22 Potential to Achieve Goals: Good Progress towards PT goals: Progressing toward goals    Frequency    Min 2X/week      PT Plan Current  plan remains appropriate    Co-evaluation              AM-PAC PT "6 Clicks" Mobility   Outcome Measure  Help needed turning from your back to your side while in a flat bed without using bedrails?: A Lot Help needed moving from lying on your back to sitting on the side of a flat bed without using bedrails?: A Lot Help needed moving to and from a bed to a chair (including a wheelchair)?: A Lot Help needed standing up from a chair using your arms (e.g., wheelchair or bedside chair)?: A Little Help needed to walk in hospital room?: A Lot Help needed climbing 3-5 steps with a railing? : A Lot 6 Click Score: 13    End of Session Equipment Utilized During Treatment: Oxygen Activity Tolerance: Patient tolerated treatment well Patient left: with family/visitor present (seated on edge of bed) Nurse Communication: Mobility status PT Visit Diagnosis: Unsteadiness on feet (R26.81);Muscle weakness (generalized) (M62.81)     Time: [id number] PT Time Calculation (min) (ACUTE ONLY): 20 min  Charges:  $Therapeutic Activity: 8-22 mins                    [name], PT, MPT    Percell Locus 07/05/2022, 1:11 PM

## 2022-07-05 NOTE — Discharge Summary (Signed)
Physician Discharge Summary   Patient: Desiree Stone MRN: [id number] DOB: 01/05/1976  Admit date:     06/06/2022  Discharge date: 07/05/22  Discharge Physician: Jennye Boroughs   PCP: [facility]   Recommendations at discharge:    Follow up with PCP in 2 weeks  Discharge Diagnoses: Principal Problem:   Acute respiratory failure with hypoxia and hypercapnia (HCC) Active Problems:   Obstructive sleep apnea   Type 2 diabetes mellitus (HCC)   Asthma   Essential hypertension   Acute respiratory failure ([place])  Resolved Problems:   Acute respiratory failure with hypoxia and hypercapnia [facility])  Hospital Course:   Desiree Stone is a 47 year old woman with medical history significant for morbid obesity, OSA on CPAP, asthma, hypertension, tobacco use disorder, type II DM, who presented to the hospital with cough productive of mucus and worsening shortness of breath.  She said she had been experiencing shortness of breath for at least 2 months.  Symptoms progressively worsened to the point where she could hardly walk down the hallway at school, and had to stop and rest after walking a short distance.  She states she has been sleeping in a chair for several weeks.  She has been unable to tolerate CPAP at home.  She was admitted to the hospital for acute hypoxic and hypercapnic respiratory failure.  She was initially treated with BiPAP for increased work of breathing.  However, she failed BiPAP and was subsequently intubated and placed on mechanical ventilation.  Workup revealed COPD exacerbation and new onset acute diastolic CHF. She was treated with IV steroids, bronchodilators, empiric IV antibiotics and IV Lasix. She developed hospital-acquired pneumonia which was treated with empiric IV antibiotics.    She could not be liberated from the ventilator.  Tracheostomy was performed on 06/19/2022.  She was eventually weaned off of mechanical ventilation.  She had NG tube for  enteral nutrition.  NG tube was discontinued as patient was able to tolerate a dysphagia 1 diet.  Tracheostomy was downsized on 07/04/2022 by otolaryngologist.   She also had complex fluid collection noted in the lower left quadrant on abdominal ultrasound suspicious for abdominal wall abscess.  Abdominal wall abscess ruptured spontaneously and was draining serosanguineous fluid on 07/05/2022.  She was evaluated by PT and OT who recommended further rehabilitation at the inpatient rehab facility.  Her condition has improved and she is deemed stable for discharge. Discharge plan discussed with her mother at the bedside   Assessment and Plan:       Consultants: Intensivist, otolaryngologist Procedures performed: None  Disposition: Rehabilitation facility Diet recommendation:  Discharge Diet Orders (From admission, onward)     Start     Ordered   07/05/22 0000  DIET - DYS 1       Comments: Medication crushed with puree  Question:  Fluid consistency:  Answer:  Thin   07/05/22 1141           Cardiac and Carb modified diet DISCHARGE MEDICATION: Allergies as of 07/05/2022       Reactions   Abilify [aripiprazole] Other (See Comments)   Syncope   Effexor [venlafaxine] Other (See Comments)   Hair Loss   Wellbutrin [bupropion] Other (See Comments)   Acne        Medication List     STOP taking these medications    clindamycin 1 % external solution Commonly known as: CLEOCIN T       TAKE these medications    albuterol 108 (  90 Base) MCG/ACT inhaler Commonly known as: VENTOLIN HFA Inhale into the lungs every 6 (six) hours as needed for wheezing or shortness of breath.   atorvastatin 40 MG tablet Commonly known as: LIPITOR Take 40 mg by mouth at bedtime.   cetirizine 10 MG tablet Commonly known as: ZYRTEC Take 10 mg by mouth daily.   cyanocobalamin 1000 MCG tablet Take 1,000 mcg by mouth daily.   enalapril 20 MG tablet Commonly known as: VASOTEC Take 20 mg by  mouth daily.   famotidine 20 MG tablet Commonly known as: PEPCID Take 20 mg by mouth 2 (two) times daily.   fluticasone 110 MCG/ACT inhaler Commonly known as: FLOVENT HFA Inhale 2 puffs into the lungs 2 (two) times daily.   hydrochlorothiazide 25 MG tablet Commonly known as: HYDRODIURIL Take 25 mg by mouth daily.   hydrOXYzine 25 MG tablet Commonly known as: ATARAX Take 25 mg by mouth 3 (three) times daily as needed.   insulin glargine-yfgn 100 UNIT/ML injection Commonly known as: SEMGLEE Inject 0.3 mLs (30 Units total) into the skin 2 (two) times daily.   ipratropium 17 MCG/ACT inhaler Commonly known as: ATROVENT HFA Inhale 2 puffs into the lungs every 4 (four) hours as needed for wheezing.   liraglutide 18 MG/3ML Sopn Commonly known as: VICTOZA Inject 2.4 mg into the skin daily.   metFORMIN 500 MG 24 hr tablet Commonly known as: GLUCOPHAGE-XR Take 1,000 mg by mouth 2 (two) times daily with a meal.        Discharge Exam: Filed Weights   07/03/22 0849 07/04/22 0930 07/05/22 0804  Weight: 126.8 kg 129.6 kg 129.2 kg   GEN: NAD SKIN: Warm and dry.  Serosanguineous discharge from small wound on left lower quadrant EYES: EOMI ENT: MMM, + tracheostomy CV: RRR PULM: CTA B ABD: soft, obese, NT, +BS CNS: AAO x 3, non focal EXT: No edema or tenderness   Condition at discharge: good  The results of significant diagnostics from this hospitalization (including imaging, microbiology, ancillary and laboratory) are listed below for reference.   Imaging Studies: US Abdomen Limited  Result Date: 07/03/2022 CLINICAL DATA:  Left lower abdominal tenderness and masslike area for 1 day. EXAM: ULTRASOUND ABDOMEN LIMITED COMPARISON:  Abdominal CT of 06/22/2022 FINDINGS: 3.4 x 1.3 x 2.4 cm heterogeneously hypoechoic lesion within the left lower abdominal wall, corresponding to the area of clinical concern. This demonstrates peripheral vascularity. IMPRESSION: Nonspecific 3.4 cm  complex cystic lesion within the area of concern of the anterior abdominal wall. Appearance is nonspecific. Considerations include phlegmon/developing abscess, hematoma (with possible superimposed infection) or a necrotic soft tissue mass. Electronically Signed   By: Abigail Miyamoto M.D.   On: 07/03/2022 13:30   DG Chest Port 1 View  Result Date: 07/02/2022 CLINICAL DATA:  NG tube placement. EXAM: PORTABLE CHEST 1 VIEW COMPARISON:  06/22/2022 FINDINGS: 0305 hours. NG tube is looped in the stomach with the tip overlying the region of the gastric cardia. Proximal side port is well below the GE junction. Endotracheal tube tip projects 3.5 cm above the base of the carina. Low volume lordotic film. The cardio pericardial silhouette is enlarged. Bibasilar atelectasis noted. IMPRESSION: NG tube is looped in the stomach with the tip overlying the region of the gastric cardia. Electronically Signed   By: Misty Stanley M.D.   On: 07/02/2022 07:32   DG Abd 1 View  Result Date: 07/01/2022 CLINICAL DATA:  NG tube placement. EXAM: ABDOMEN - 1 VIEW COMPARISON:  06/16/2022. FINDINGS: Nasal/orogastric  tube passes below the diaphragm, well into the stomach. Normal visualized bowel gas pattern. IMPRESSION: 1. Well-positioned nasal/orogastric tube. Electronically Signed   By: Lajean Manes M.D.   On: 07/01/2022 13:31   US Venous Img Upper Bilat (DVT)  Result Date: 06/22/2022 CLINICAL DATA:  Fevers and upper extremity swelling, known PICC on the right. EXAM: BILATERAL UPPER EXTREMITY VENOUS DOPPLER ULTRASOUND TECHNIQUE: Gray-scale sonography with graded compression, as well as color Doppler and duplex ultrasound were performed to evaluate the bilateral upper extremity deep venous systems from the level of the subclavian vein and including the jugular, axillary, basilic, radial, ulnar and upper cephalic vein. Spectral Doppler was utilized to evaluate flow at rest and with distal augmentation maneuvers. COMPARISON:  None  Available. FINDINGS: RIGHT UPPER EXTREMITY Internal Jugular Vein: No evidence of thrombus. Normal compressibility, respiratory phasicity and response to augmentation. Subclavian Vein: No evidence of thrombus. Normal compressibility, respiratory phasicity and response to augmentation. Axillary Vein: No evidence of thrombus. Normal compressibility, respiratory phasicity and response to augmentation. Cephalic Vein: No evidence of thrombus. Normal compressibility, respiratory phasicity and response to augmentation. Basilic Vein: No evidence of thrombus. Normal compressibility, respiratory phasicity and response to augmentation. Brachial Veins: No evidence of thrombus. Normal compressibility, respiratory phasicity and response to augmentation. Radial Veins: No evidence of thrombus. Normal compressibility, respiratory phasicity and response to augmentation. Ulnar Veins: No evidence of thrombus. Normal compressibility, respiratory phasicity and response to augmentation. Venous Reflux:  None. Other Findings:  None. LEFT UPPER EXTREMITY Internal Jugular Vein: No evidence of thrombus. Normal compressibility, respiratory phasicity and response to augmentation. Subclavian Vein: No evidence of thrombus. Normal compressibility, respiratory phasicity and response to augmentation. Axillary Vein: No evidence of thrombus. Normal compressibility, respiratory phasicity and response to augmentation. Cephalic Vein: No evidence of thrombus. Normal compressibility, respiratory phasicity and response to augmentation. Basilic Vein: No evidence of thrombus. Normal compressibility, respiratory phasicity and response to augmentation. Brachial Veins: No evidence of thrombus. Normal compressibility, respiratory phasicity and response to augmentation. Radial Veins: No evidence of thrombus. Normal compressibility, respiratory phasicity and response to augmentation. Ulnar Veins: No evidence of thrombus. Normal compressibility, respiratory phasicity  and response to augmentation. Venous Reflux:  None. Other Findings:  None. IMPRESSION: No evidence of DVT within either upper extremity. Somewhat limited evaluation of the right brachial and basilic veins is noted due to the bandaging from the known PICC Electronically Signed   By: Inez Catalina M.D.   On: 06/22/2022 17:33   US Venous Img Lower Bilateral (DVT)  Result Date: 06/22/2022 CLINICAL DATA:  Bilateral lower extremity edema EXAM: BILATERAL LOWER EXTREMITY VENOUS DOPPLER ULTRASOUND TECHNIQUE: Gray-scale sonography with graded compression, as well as color Doppler and duplex ultrasound were performed to evaluate the lower extremity deep venous systems from the level of the common femoral vein and including the common femoral, femoral, profunda femoral, popliteal and calf veins including the posterior tibial, peroneal and gastrocnemius veins when visible. The superficial great saphenous vein was also interrogated. Spectral Doppler was utilized to evaluate flow at rest and with distal augmentation maneuvers in the common femoral, femoral and popliteal veins. COMPARISON:  None Available. FINDINGS: RIGHT LOWER EXTREMITY Common Femoral Vein: No evidence of thrombus. Normal compressibility, respiratory phasicity and response to augmentation. Saphenofemoral Junction: No evidence of thrombus. Normal compressibility and flow on color Doppler imaging. Profunda Femoral Vein: No evidence of thrombus. Normal compressibility and flow on color Doppler imaging. Femoral Vein: No evidence of thrombus. Normal compressibility, respiratory phasicity and response to augmentation. Popliteal Vein: No  evidence of thrombus. Normal compressibility, respiratory phasicity and response to augmentation. Calf Veins: No evidence of thrombus. Normal compressibility and flow on color Doppler imaging. Superficial Great Saphenous Vein: No evidence of thrombus. Normal compressibility. Venous Reflux:  None. Other Findings:  None. LEFT LOWER  EXTREMITY Common Femoral Vein: No evidence of thrombus. Normal compressibility, respiratory phasicity and response to augmentation. Saphenofemoral Junction: No evidence of thrombus. Normal compressibility and flow on color Doppler imaging. Profunda Femoral Vein: No evidence of thrombus. Normal compressibility and flow on color Doppler imaging. Femoral Vein: No evidence of thrombus. Normal compressibility, respiratory phasicity and response to augmentation. Popliteal Vein: No evidence of thrombus. Normal compressibility, respiratory phasicity and response to augmentation. Calf Veins: No evidence of thrombus. Normal compressibility and flow on color Doppler imaging. Superficial Great Saphenous Vein: No evidence of thrombus. Normal compressibility. Venous Reflux:  None. Other Findings:  None. IMPRESSION: No evidence of deep venous thrombosis in either lower extremity. Electronically Signed   By: Inez Catalina M.D.   On: 06/22/2022 17:31   CT ABDOMEN WO CONTRAST  Result Date: 06/22/2022 CLINICAL DATA:  Evaluate anatomy for percutaneous gastrostomy tube placement. Respiratory distress. EXAM: CT ABDOMEN WITHOUT CONTRAST TECHNIQUE: Multidetector CT imaging of the abdomen was performed following the standard protocol without IV contrast. RADIATION DOSE REDUCTION: This exam was performed according to the departmental dose-optimization program which includes automated exposure control, adjustment of the mA and/or kV according to patient size and/or use of iterative reconstruction technique. COMPARISON:  Chest CT 06/18/2022 and CT abdomen 01/30/2010 FINDINGS: Lower chest: Mildly prominent lymph nodes in the lower axillary regions bilaterally. Hepatobiliary: Gallbladder is mildly distended without surrounding inflammatory changes. No acute abnormality to the liver on this study without intravascular contrast. Chronic focal soft tissue just posterior to the stomach that is likely contiguous with the caudate lobe and could  represent a papillary process of the caudate lobe. Pancreas: Unremarkable. No pancreatic ductal dilatation or surrounding inflammatory changes. Spleen: Normal in size without focal abnormality. Adrenals/Urinary Tract: Left adrenal nodule measures approximately 2.1 cm minimally changed since 2011. This is most compatible with a benign nodule based on the stability. Right adrenal gland is unremarkable. Slightly limited evaluation of the kidneys due to motion artifact. No hydronephrosis. Stomach/Bowel: Feeding tube extends into the stomach and terminates in the distal stomach near the antrum. Portion of the transverse colon is anterior to the distal stomach. Normal appearance of the stomach. Normal appearance of the visualized small and large bowel. Appendix is partially visualized without acute inflammatory changes. Vascular/Lymphatic: Mild atherosclerotic calcifications in the abdominal aorta and iliac arteries without aortic aneurysm. No significant lymph node enlargement in the abdomen. Other: Negative for ascites.  Negative for free air. Musculoskeletal: Mild subcutaneous edema. No acute bone abnormality. IMPRESSION: 1. Portion of the transverse colon overlies the distal stomach. Consider visualization of the transverse colon during percutaneous gastrostomy tube placement. 2. Persistent consolidation in both lower lobes. 3. Chronic left adrenal nodule. Minimal change since 2011 and likely a benign lesion. 4. Gallbladder distention without inflammatory changes. Electronically Signed   By: Markus Daft M.D.   On: 06/22/2022 16:00   DG Chest Port 1 View  Result Date: 06/22/2022 CLINICAL DATA:  Acute on chronic respiratory failure with hypoxia and hypercapnia. Intubated in ICU. EXAM: PORTABLE CHEST 1 VIEW COMPARISON:  AP chest 06/18/2022 FINDINGS: Apparent tracheostomy tube overlies the midline trachea, appearing new from prior. The prior endotracheal tube appears to have been removed. A nasogastric tube again  descends below the diaphragm  with the tip excluded by collimation. Right upper extremity PICC tip again overlies the superior vena cava/right atrial junction. Cardiac silhouette is again mildly to moderately enlarged. Mildly decreased lung volumes. Bilateral mid and lower lung interstitial thickening with left-greater-than-right basilar heterogeneous airspace opacities. Small left pleural effusion. No pneumothorax is seen. No acute skeletal abnormality. IMPRESSION: 1. Tracheostomy tube overlies the midline trachea, appearing new from prior. Interval removal of prior endotracheal tube. 2. Bilateral mid and lower lung interstitial thickening with left-greater-than-right basilar heterogeneous airspace opacities, similar to prior. Findings could represent atelectasis or pneumonia. 3. Small left pleural effusion. Electronically Signed   By: Yvonne Kendall M.D.   On: 06/22/2022 08:29   CT CHEST WO CONTRAST  Result Date: 06/18/2022 CLINICAL DATA:  Acute respiratory failure with hypoxia. Pleural effusion, known or suspected (Ped 0-17y) EXAM: CT CHEST WITHOUT CONTRAST TECHNIQUE: Multidetector CT imaging of the chest was performed following the standard protocol without IV contrast. RADIATION DOSE REDUCTION: This exam was performed according to the departmental dose-optimization program which includes automated exposure control, adjustment of the mA and/or kV according to patient size and/or use of iterative reconstruction technique. COMPARISON:  06/06/2022 FINDINGS: Cardiovascular: Cardiomegaly. No evidence of pericardial effusion. Right arm PICC line, endotracheal tube, and nasogastric tube remain in place. Mediastinum/Nodes: No masses or pathologically enlarged lymph nodes identified on this unenhanced exam. Stable goiter. Lungs/Pleura: No evidence of pleural effusion. Bilateral lower lobe consolidation is seen with central air bronchograms. Right middle lobe subsegmental atelectasis also noted. No evidence of central  endobronchial obstruction. Upper Abdomen:  Unremarkable. Musculoskeletal:  No suspicious bone lesions. IMPRESSION: Bilateral lower lobe consolidation, and right middle lobe subsegmental atelectasis. No evidence of central endobronchial obstruction. No evidence of pleural effusion. Electronically Signed   By: Marlaine Hind M.D.   On: 06/18/2022 15:33   DG Chest Port 1 View  Result Date: 06/18/2022 CLINICAL DATA:  Pleural effusion. EXAM: PORTABLE CHEST 1 VIEW COMPARISON:  Chest x-ray June 17, 2022 FINDINGS: A feeding tube terminates below today's film. The ETT terminates in the mid trachea in good position. No pneumothorax. Stable right PICC line terminating in the central SVC. Opacity in the left base obscures the left hemidiaphragm. Haziness in the right base. Increased interstitial markings. IMPRESSION: 1. Cardiomegaly and pulmonary venous congestion. Possible small layering effusion on the right. More focal opacity in the left retrocardiac region, incompletely assessed on this portable study. 2. Support apparatus as above. Electronically Signed   By: Dorise Bullion III M.D.   On: 06/18/2022 09:58   DG Chest Port 1 View  Result Date: 06/17/2022 CLINICAL DATA:  [id number] with acute on chronic respiratory failure, hypoxia, hypercapnia. EXAM: PORTABLE CHEST 1 VIEW COMPARISON:  Portable chest 06/15/2022 FINDINGS: 5:03 a.m. ETT tip is 3.5 cm from carina. Feeding tube enters the stomach with the radiopaque tip not filmed. Right PICC terminates at the superior cavoatrial junction. There is stable cardiomegaly. There is central vascular prominence without overt edema. There are bilateral small to moderate layering pleural effusions with patchy dense consolidation in the left lower lobe and less dense hazy opacity in the right base which could be simple atelectasis or pneumonitis. The upper zones are generally clear except for perihilar linear atelectasis. Overall aeration seems unchanged. The mediastinal  configuration is stable. There is thoracic spondylosis. IMPRESSION: 1. Stable cardiomegaly. Central vascular prominence without overt edema. 2. Stable pleural effusions and left-greater-than-right lower zonal opacities. Electronically Signed   By: Telford Nab M.D.   On: 06/17/2022 07:00  DG Abd Portable 1V  Result Date: 06/16/2022 CLINICAL DATA:  Check feeding catheter placement EXAM: PORTABLE ABDOMEN - 1 VIEW COMPARISON:  06/15/2022 FINDINGS: Weighted feeding catheter is noted in the mid to distal stomach. No obstructive changes are seen. IMPRESSION: Feeding catheter as described. Electronically Signed   By: Inez Catalina M.D.   On: 06/16/2022 18:51   DG Abd 1 View  Result Date: 06/15/2022 CLINICAL DATA:  Feeding tube placement EXAM: ABDOMEN - 1 VIEW COMPARISON:  06/15/2022 FINDINGS: The nasogastric tube is been removed. A feeding tube is present tip projecting over the anticipated location of the stomach body. Scattered gas in the transverse colon. IMPRESSION: 1. Feeding tube tip projects over the anticipated location of the stomach body. Electronically Signed   By: Van Clines M.D.   On: 06/15/2022 13:37   DG Abd 1 View  Result Date: 06/15/2022 CLINICAL DATA:  Encounter for orogastric tube placement EXAM: ABDOMEN - 1 VIEW COMPARISON:  Portable exam 1012 hours compared to 06/10/2022 FINDINGS: Tip of orogastric tube projects over mid stomach. Nonobstructive bowel gas pattern. Atelectasis versus infiltrate LEFT lung base. No acute osseous findings. IMPRESSION: Tip of orogastric tube projects over mid stomach. Electronically Signed   By: Lavonia Dana M.D.   On: 06/15/2022 10:37   DG Chest Port 1 View  Result Date: 06/15/2022 CLINICAL DATA:  Endotracheally intubated EXAM: PORTABLE CHEST 1 VIEW COMPARISON:  Radiograph 06/13/2022 FINDINGS: Endotracheal tube overlies the midthoracic trachea approximately 3.0 cm above the carina. Unchanged cardiomediastinal silhouette. There are diffuse  interstitial and lower lung airspace opacities, increased in the right lung base in comparison to prior. Probable small left pleural effusion. No evidence of pneumothorax. Bones are unchanged. IMPRESSION: Unchanged cardiomegaly with persistent interstitial and lower lung predominant airspace disease, increased in the right lung base, favored to be pulmonary edema with bibasilar atelectasis and left pleural effusion. Electronically Signed   By: Maurine Simmering M.D.   On: 06/15/2022 08:58   DG Chest Port 1 View  Result Date: 06/13/2022 CLINICAL DATA:  [id number] Acute on chronic respiratory failure with hypoxia and hypercapnia ([place]) [id number] EXAM: PORTABLE CHEST 1 VIEW COMPARISON:  06/10/2022 chest radiograph. FINDINGS: Endotracheal tube tip is 1.1 cm above the carina. Enteric tube enters stomach with the tip not seen on this image. Right PICC terminates over the cavoatrial junction. Stable cardiomediastinal silhouette with mild cardiomegaly. No pneumothorax. No right pleural effusion. Possible stable small left pleural effusion. Mild diffuse prominence of the parahilar interstitial markings, improved. Stable dense left retrocardiac consolidation. IMPRESSION: 1. Endotracheal tube tip is 1.1 cm above the carina, consider retracting 1 cm. 2. Stable mild cardiomegaly. Improved mild diffuse prominence of the parahilar interstitial markings, favor improving mild pulmonary edema. 3. Possible stable small left pleural effusion. 4. Stable dense left retrocardiac consolidation, favor atelectasis. Electronically Signed   By: Ilona Sorrel M.D.   On: 06/13/2022 08:13   DG Chest Port 1 View  Result Date: 06/10/2022 CLINICAL DATA:  Endotracheal and OG tube placement. EXAM: PORTABLE CHEST 1 VIEW COMPARISON:  06/10/2022 FINDINGS: 1326 hours. Endotracheal tube tip is 3.1 cm above the base of the carina. The NG tube passes into the stomach although the distal tip position is not included on the film. The cardio pericardial silhouette  is enlarged. Vascular congestion again noted persistent left base collapse/consolidation. IMPRESSION: 1. Endotracheal tube tip is 3.1 cm above the base of the carina. 2. Persistent left base collapse/consolidation. Electronically Signed   By: Misty Stanley M.D.   On:  06/10/2022 14:16   DG Abd 1 View  Result Date: 06/10/2022 CLINICAL DATA:  Orogastric tube placement EXAM: ABDOMEN - 1 VIEW COMPARISON:  Portable exam 1327 hours compared to 06/07/2022 FINDINGS: Tip of orogastric tube projects over gastric antrum. Air-filled nondistended loops of large and small bowel. IMPRESSION: Tip of orogastric tube projects over gastric antrum. Electronically Signed   By: Lavonia Dana M.D.   On: 06/10/2022 13:56   [place] EKG SITE RITE  Result Date: 06/10/2022 If [facility] image not attached, placement could not be confirmed due to current cardiac rhythm.  DG Chest Port 1 View  Result Date: 06/10/2022 CLINICAL DATA:  47 year old female with acute on chronic respiratory failure. EXAM: PORTABLE CHEST 1 VIEW COMPARISON:  Portable chest 06/08/2022 and earlier. FINDINGS: Portable AP semi upright view at 0507 hours. Visible endotracheal and enteric tubes appear stable, distal enteric tube difficult to visualize in the upper abdomen today. Stable lung volumes and mediastinal contours. Retrocardiac hypo ventilation with some air bronchograms probably increased from the chest CTA 06/06/2022 which demonstrated only atelectasis. Stable right lung. No pneumothorax or pulmonary edema. Stable visualized osseous structures. Paucity bowel gas in the visible abdomen. IMPRESSION: 1.  Stable lines and tubes. 2. Left lower lobe collapse or consolidation appears increased since the CTA on 06/06/2022. 3. Otherwise stable atelectasis. Electronically Signed   By: Genevie Ann M.D.   On: 06/10/2022 08:33   ECHOCARDIOGRAM COMPLETE  Result Date: 06/08/2022    ECHOCARDIOGRAM REPORT   Patient Name:   NILE SHEDDEN Date of Exam: 06/07/2022 Medical Rec #:   [id number]         Height:       59.0 in Accession #:    [id number]        Weight:       299.8 lb Date of Birth:  January 20, 1976        BSA:          2.191 m Patient Age:    78 years          BP:           121/76 mmHg Patient Gender: F                 HR:           84 bpm. Exam Location:  [facility] Procedure: 2D Echo, Cardiac Doppler, Color Doppler and Intracardiac            Opacification Agent Indications:     Cardiomegaly  History:         Patient has no prior history of Echocardiogram examinations.                  Risk Factors:Hypertension, Diabetes and Current Smoker.  Sonographer:     Wenda Low Referring Phys:  [id number] [place] L [place] Diagnosing Phys: Neoma Laming  Sonographer Comments: Patient is obese and echo performed with patient supine and on artificial respirator. IMPRESSIONS  1. Left ventricular ejection fraction, by estimation, is 60 to 65%. The left ventricle has normal function. The left ventricle has no regional wall motion abnormalities. There is mild concentric left ventricular hypertrophy. Left ventricular diastolic parameters were normal.  2. Right ventricular systolic function is normal. The right ventricular size is normal.  3. Left atrial size was mildly dilated.  4. Right atrial size was mildly dilated.  5. The mitral valve is normal in structure. Trivial mitral valve regurgitation. No evidence of mitral stenosis.  6. The aortic valve is normal  in structure. Aortic valve regurgitation is not visualized. Aortic valve sclerosis is present, with no evidence of aortic valve stenosis.  7. The inferior vena cava is normal in size with greater than 50% respiratory variability, suggesting right atrial pressure of 3 mmHg. FINDINGS  Left Ventricle: Left ventricular ejection fraction, by estimation, is 60 to 65%. The left ventricle has normal function. The left ventricle has no regional wall motion abnormalities. Definity contrast agent was given IV to delineate the left ventricular   endocardial borders. The left ventricular internal cavity size was normal in size. There is mild concentric left ventricular hypertrophy. Left ventricular diastolic parameters were normal. Right Ventricle: The right ventricular size is normal. No increase in right ventricular wall thickness. Right ventricular systolic function is normal. Left Atrium: Left atrial size was mildly dilated. Right Atrium: Right atrial size was mildly dilated. Pericardium: There is no evidence of pericardial effusion. Mitral Valve: The mitral valve is normal in structure. Trivial mitral valve regurgitation. No evidence of mitral valve stenosis. MV peak gradient, 4.0 mmHg. The mean mitral valve gradient is 2.0 mmHg. Tricuspid Valve: The tricuspid valve is normal in structure. Tricuspid valve regurgitation is not demonstrated. No evidence of tricuspid stenosis. Aortic Valve: The aortic valve is normal in structure. Aortic valve regurgitation is not visualized. Aortic valve sclerosis is present, with no evidence of aortic valve stenosis. Aortic valve mean gradient measures 5.0 mmHg. Aortic valve peak gradient measures 9.2 mmHg. Aortic valve area, by VTI measures 2.10 cm. Pulmonic Valve: The pulmonic valve was normal in structure. Pulmonic valve regurgitation is not visualized. No evidence of pulmonic stenosis. Aorta: The aortic root is normal in size and structure. Venous: The inferior vena cava is normal in size with greater than 50% respiratory variability, suggesting right atrial pressure of 3 mmHg. IAS/Shunts: No atrial level shunt detected by color flow Doppler.  LEFT VENTRICLE PLAX 2D LVIDd:         4.40 cm   Diastology LVIDs:         2.90 cm   LV e' medial:    6.42 cm/s LV PW:         1.70 cm   LV E/e' medial:  11.5 LV IVS:        1.70 cm   LV e' lateral:   9.03 cm/s LVOT diam:     2.00 cm   LV E/e' lateral: 8.2 LV SV:         63 LV SV Index:   29 LVOT Area:     3.14 cm  RIGHT VENTRICLE RV Basal diam:  3.80 cm RV Mid diam:    3.10  cm RV S prime:     13.70 cm/s LEFT ATRIUM             Index        RIGHT ATRIUM           Index LA diam:        3.40 cm 1.55 cm/m   RA Area:     19.10 cm LA Vol (A2C):   70.2 ml 32.05 ml/m  RA Volume:   58.40 ml  26.66 ml/m LA Vol (A4C):   31.9 ml 14.56 ml/m LA Biplane Vol: 49.3 ml 22.50 ml/m  AORTIC VALVE                     PULMONIC VALVE AV Area (Vmax):    2.21 cm      PV Vmax:  1.22 m/s AV Area (Vmean):   2.16 cm      PV Peak grad:  6.0 mmHg AV Area (VTI):     2.10 cm AV Vmax:           152.00 cm/s AV Vmean:          102.000 cm/s AV VTI:            0.301 m AV Peak Grad:      9.2 mmHg AV Mean Grad:      5.0 mmHg LVOT Vmax:         107.00 cm/s LVOT Vmean:        70.000 cm/s LVOT VTI:          0.201 m LVOT/AV VTI ratio: 0.67  AORTA Ao Root diam: 3.50 cm MITRAL VALVE MV Area (PHT): 2.75 cm    SHUNTS MV Area VTI:   2.54 cm    Systemic VTI:  0.20 m MV Peak grad:  4.0 mmHg    Systemic Diam: 2.00 cm MV Mean grad:  2.0 mmHg MV Vmax:       1.00 m/s MV Vmean:      66.5 cm/s MV Decel Time: 276 msec MV E velocity: 73.90 cm/s MV A velocity: 62.90 cm/s MV E/A ratio:  1.17 Shaukat Khan Electronically signed by Neoma Laming Signature Date/Time: 06/08/2022/1:50:29 PM    Final    DG Chest Port 1 View  Result Date: 06/08/2022 CLINICAL DATA:  Acute respiratory failure, hypoxia EXAM: PORTABLE CHEST 1 VIEW COMPARISON:  06/07/2022 FINDINGS: Endotracheal tube and NG tube unchanged. Stable large cardiac silhouette. Interval increase in bilateral pleural effusions. Mild venous congestion. LEFT lobe atelectasis. IMPRESSION: 1. Stable support apparatus. 2. Increase in bilateral pleural effusions and central venous congestion Electronically Signed   By: Suzy Bouchard M.D.   On: 06/08/2022 08:21   DG Abd Portable 1 View  Result Date: 06/07/2022 CLINICAL DATA:  Orogastric tube EXAM: PORTABLE ABDOMEN - 1 VIEW COMPARISON:  None similar and recent FINDINGS: Enteric tube with tip and side port at the gastric bubble. Image  blurring, no gas dilated bowel or concerning mass effect. Under penetrated study. IMPRESSION: Enteric tube with tip and side port at the stomach. Electronically Signed   By: Jorje Guild M.D.   On: 06/07/2022 05:01   DG Chest Portable 1 View  Result Date: 06/07/2022 CLINICAL DATA:  Status post intubation and NG tube placement. EXAM: PORTABLE CHEST 1 VIEW COMPARISON:  06/06/2022 FINDINGS: 0434 hours. The cardio pericardial silhouette is enlarged. There is pulmonary vascular congestion without overt pulmonary edema. Retrocardiac opacity is likely atelectasis. Endotracheal tube tip is 1.5 cm above the base of the carina. The NG tube passes into the stomach although the distal tip position is not included on the film. Telemetry leads overlie the chest. IMPRESSION: 1. Endotracheal tube tip is 1.5 cm above the base of the carina. 2. Pulmonary vascular congestion without overt pulmonary edema. Electronically Signed   By: Misty Stanley M.D.   On: 06/07/2022 04:55   CT Angio Chest PE W and/or Wo Contrast  Result Date: 06/06/2022 CLINICAL DATA:  Shortness of breath EXAM: CT ANGIOGRAPHY CHEST WITH CONTRAST TECHNIQUE: Multidetector CT imaging of the chest was performed using the standard protocol during bolus administration of intravenous contrast. Multiplanar CT image reconstructions and MIPs were obtained to evaluate the vascular anatomy. RADIATION DOSE REDUCTION: This exam was performed according to the departmental dose-optimization program which includes automated exposure control, adjustment of the mA and/or kV  according to patient size and/or use of iterative reconstruction technique. CONTRAST:  139m OMNIPAQUE IOHEXOL 350 MG/ML SOLN COMPARISON:  Same day chest radiograph, CT chest 07/19/2010 FINDINGS: Cardiovascular: There is adequate opacification of the pulmonary arteries to the lobar level. The distal pulmonary arteries are suboptimally assessed due to patient body habitus. Within this confine, there is no  definite evidence of pulmonary embolism. The heart is enlarged. There is no pericardial effusion. The main pulmonary artery is enlarged measuring up to 3.7 cm. There is reflux contrast into the IVC/hepatic veins. The thoracic aorta is unremarkable. Mediastinum/Nodes: The thyroid is unremarkable. The esophagus is grossly unremarkable. There is no mediastinal, hilar, or axillary lymphadenopathy. Lungs/Pleura: The trachea and central airways are patent. There is no focal consolidation or pulmonary edema. There is no pleural effusion or pneumothorax. There are no suspicious nodules. Upper Abdomen: There is a 2.2 cm left adrenal nodule which has been present since 2012, consistent with a benign adenoma requiring no specific imaging follow-up. Musculoskeletal: There is no acute osseous abnormality or suspicious osseous lesion. Review of the MIP images confirms the above findings. IMPRESSION: 1. No large central pulmonary embolism. The distal pulmonary arteries are not well evaluated due to patient body habitus. No other evidence of acute cardiopulmonary pathology. 2. Cardiomegaly with enlarged main pulmonary artery suggesting pulmonary hypertension, and refluxed contrast into the IVC suggesting right heart dysfunction. 3. Left adrenal nodule has been present since 2012 consistent with a benign adenoma requiring no specific imaging follow-up. Electronically Signed   By: PValetta MoleM.D.   On: 06/06/2022 16:04   CT Soft Tissue Neck W Contrast  Result Date: 06/06/2022 CLINICAL DATA:  Shortness of breath. EXAM: CT NECK WITH CONTRAST TECHNIQUE: Multidetector CT imaging of the neck was performed using the standard protocol following the bolus administration of intravenous contrast. RADIATION DOSE REDUCTION: This exam was performed according to the departmental dose-optimization program which includes automated exposure control, adjustment of the mA and/or kV according to patient size and/or use of iterative reconstruction  technique. CONTRAST:  1050mOMNIPAQUE IOHEXOL 350 MG/ML SOLN COMPARISON:  CT neck 09/10/2017 FINDINGS: Pharynx and larynx: The nasal cavity is clear. Mild prominence of the adenoid tonsils is similar to 2019. The oral cavity and oropharynx are unremarkable. The parapharyngeal spaces are clear. The hypopharynx and larynx are unremarkable. The vocal folds are grossly unremarkable. The epiglottis is normal. There is no retropharyngeal fluid collection.  The airway is patent. Salivary glands: The parotid and submandibular glands are unremarkable. Thyroid: Unremarkable. Lymph nodes: There is no pathologic lymphadenopathy in the neck. Vascular: There is a medialized course of the bilateral common carotid arteries. Limited intracranial: The imaged portions of the intracranial compartment are unremarkable. Visualized orbits: Not included within the field of view. Mastoids and visualized paranasal sinuses: There is trace fluid in the right mastoid tip. The right middle ear cavity is clear. The left mastoid air cells and middle ear cavity are clear. The imaged paranasal sinuses are clear. Skeleton: There is no acute osseous abnormality or suspicious osseous lesion. Upper chest: Assessed on the separately dictated CTA chest. Other: None. IMPRESSION: No acute finding in the neck. Electronically Signed   By: PeValetta Mole.D.   On: 06/06/2022 15:54   DG Chest 2 View  Result Date: 06/06/2022 CLINICAL DATA:  Shortness of breath, cough. EXAM: CHEST - 2 VIEW COMPARISON:  Chest x-ray dated 07/20/2021. FINDINGS: Borderline cardiomegaly. Mild prominence of the central bronchovascular markings, similar to previous exam. Lungs appear otherwise clear. No  confluent opacity to suggest a consolidating pneumonia. No pleural effusion or pneumothorax is seen. Osseous structures about the chest are unremarkable. IMPRESSION: 1. No active cardiopulmonary disease. No evidence of pneumonia or pulmonary edema. 2. Bronchitis/reactive airways  disease, likely chronic. 3. Borderline cardiomegaly. Electronically Signed   By: Franki Cabot M.D.   On: 06/06/2022 13:17    Microbiology: Results for orders placed or performed during the hospital encounter of 06/06/22  Resp panel by RT-PCR (RSV, Flu A&B, Covid) Anterior Nasal Swab     Status: None   Collection Time: 06/06/22 12:39 PM   Specimen: Anterior Nasal Swab  Result Value Ref Range Status   SARS Coronavirus 2 by RT PCR NEGATIVE NEGATIVE Final    Comment: (NOTE) SARS-CoV-2 target nucleic acids are NOT DETECTED.  The SARS-CoV-2 RNA is generally detectable in upper respiratory specimens during the acute phase of infection. The lowest concentration of SARS-CoV-2 viral copies this assay can detect is 138 copies/mL. A negative result does not preclude SARS-Cov-2 infection and should not be used as the sole basis for treatment or other patient management decisions. A negative result may occur with  improper specimen collection/handling, submission of specimen other than nasopharyngeal swab, presence of viral mutation(s) within the areas targeted by this assay, and inadequate number of viral copies(<138 copies/mL). A negative result must be combined with clinical observations, patient history, and epidemiological information. The expected result is Negative.  Fact Sheet for Patients:  EntrepreneurPulse.com.au  Fact Sheet for Healthcare Providers:  IncredibleEmployment.be  This test is no t yet approved or cleared by the [place] FDA and  has been authorized for detection and/or diagnosis of SARS-CoV-2 by FDA under an Emergency Use Authorization (EUA). This EUA will remain  in effect (meaning this test can be used) for the duration of the COVID-19 declaration under Section 564(b)(1) of the Act, 21 U.S.C.section 360bbb-3(b)(1), unless the authorization is terminated  or revoked sooner.       Influenza A by PCR NEGATIVE NEGATIVE Final    Influenza B by PCR NEGATIVE NEGATIVE Final    Comment: (NOTE) The Xpert Xpress SARS-CoV-2/FLU/RSV plus assay is intended as an aid in the diagnosis of influenza from Nasopharyngeal swab specimens and should not be used as a sole basis for treatment. Nasal washings and aspirates are unacceptable for Xpert Xpress SARS-CoV-2/FLU/RSV testing.  Fact Sheet for Patients: EntrepreneurPulse.com.au  Fact Sheet for Healthcare Providers: IncredibleEmployment.be  This test is not yet approved or cleared by the [place] FDA and has been authorized for detection and/or diagnosis of SARS-CoV-2 by FDA under an Emergency Use Authorization (EUA). This EUA will remain in effect (meaning this test can be used) for the duration of the COVID-19 declaration under Section 564(b)(1) of the Act, 21 U.S.C. section 360bbb-3(b)(1), unless the authorization is terminated or revoked.     Resp Syncytial Virus by PCR NEGATIVE NEGATIVE Final    Comment: (NOTE) Fact Sheet for Patients: EntrepreneurPulse.com.au  Fact Sheet for Healthcare Providers: IncredibleEmployment.be  This test is not yet approved or cleared by the [place] FDA and has been authorized for detection and/or diagnosis of SARS-CoV-2 by FDA under an Emergency Use Authorization (EUA). This EUA will remain in effect (meaning this test can be used) for the duration of the COVID-19 declaration under Section 564(b)(1) of the Act, 21 U.S.C. section 360bbb-3(b)(1), unless the authorization is terminated or revoked.  Performed at [facility], [place], [place] [address]   Group A Strep by PCR [facility] Only)  Status: None   Collection Time: 06/06/22 12:39 PM   Specimen: Anterior Nasal Swab; Sterile Swab  Result Value Ref Range Status   Group A Strep by PCR NOT DETECTED NOT DETECTED Final    Comment: Performed at [facility], [address] [place], [place] [address]  Respiratory (~20 pathogens) panel by PCR     Status: None   Collection Time: 06/06/22 12:39 PM   Specimen: Nasopharyngeal Swab; Respiratory  Result Value Ref Range Status   Adenovirus NOT DETECTED NOT DETECTED Final   Coronavirus 229E NOT DETECTED NOT DETECTED Final    Comment: (NOTE) The Coronavirus on the Respiratory Panel, DOES NOT test for the novel  Coronavirus (2019 nCoV)    Coronavirus HKU1 NOT DETECTED NOT DETECTED Final   Coronavirus NL63 NOT DETECTED NOT DETECTED Final   Coronavirus OC43 NOT DETECTED NOT DETECTED Final   Metapneumovirus NOT DETECTED NOT DETECTED Final   Rhinovirus / Enterovirus NOT DETECTED NOT DETECTED Final   Influenza A NOT DETECTED NOT DETECTED Final   Influenza B NOT DETECTED NOT DETECTED Final   Parainfluenza Virus 1 NOT DETECTED NOT DETECTED Final   Parainfluenza Virus 2 NOT DETECTED NOT DETECTED Final   Parainfluenza Virus 3 NOT DETECTED NOT DETECTED Final   Parainfluenza Virus 4 NOT DETECTED NOT DETECTED Final   Respiratory Syncytial Virus NOT DETECTED NOT DETECTED Final   Bordetella pertussis NOT DETECTED NOT DETECTED Final   Bordetella Parapertussis NOT DETECTED NOT DETECTED Final   Chlamydophila pneumoniae NOT DETECTED NOT DETECTED Final   Mycoplasma pneumoniae NOT DETECTED NOT DETECTED Final    Comment: Performed at [facility], [place] [address], [place], [place] [address]  MRSA Next Gen by PCR, Nasal     Status: None   Collection Time: 06/07/22  6:26 AM   Specimen: Nasal Mucosa; Nasal Swab  Result Value Ref Range Status   MRSA by PCR Next Gen NOT DETECTED NOT DETECTED Final    Comment: (NOTE) The GeneXpert MRSA Assay (FDA approved for NASAL specimens only), is one component of a comprehensive MRSA colonization surveillance program. It is not intended to diagnose MRSA infection nor to guide or monitor treatment for MRSA infections. Test performance is not FDA approved in patients less than 11  years old. Performed at [facility], [address] [place], [place] [address]   Culture, blood (Routine X 2) w Reflex to ID Panel     Status: None   Collection Time: 06/08/22 11:34 AM   Specimen: BLOOD  Result Value Ref Range Status   Specimen Description BLOOD BLOOD RIGHT HAND brh  Final   Special Requests BOTTLES DRAWN AEROBIC AND ANAEROBIC BCLV  Final   Culture   Final    NO GROWTH 5 DAYS Performed at [facility], [address], [place],  [address]    Report Status 06/13/2022 FINAL  Final  Culture, blood (Routine X 2) w Reflex to ID Panel     Status: None   Collection Time: 06/08/22 11:35 AM   Specimen: BLOOD  Result Value Ref Range Status   Specimen Description BLOOD BLOOD LEFT HAND [facility]  Final   Special Requests BOTTLES DRAWN AEROBIC AND ANAEROBIC BCAV  Final   Culture   Final    NO GROWTH 5 DAYS Performed at [facility], [address], [place],  [address]    Report Status 06/13/2022 FINAL  Final  Culture, Respiratory w Gram Stain     Status: None  Collection Time: 06/08/22 12:23 PM   Specimen: SPU; Respiratory  Result Value Ref Range Status   Specimen Description   Final    SPUTUM Performed at [facility], [address], [place], [place] [address]    Special Requests   Final    TASP Performed at [facility], [address] [place], [place] [address]    Gram Stain   Final    MODERATE WBC PRESENT,BOTH PMN AND MONONUCLEAR RARE GRAM POSITIVE COCCI IN PAIRS AND CHAINS    Culture   Final    FEW Normal respiratory flora-no Staph aureus or Pseudomonas seen Performed at [facility], [place] [address], [place], [place] [address]    Report Status 06/11/2022 FINAL  Final  Surgical PCR screen     Status: None   Collection Time: 06/19/22 12:10 AM   Specimen: Nasal Mucosa; Nasal Swab  Result Value Ref Range Status   MRSA, PCR NEGATIVE NEGATIVE Final   Staphylococcus aureus NEGATIVE NEGATIVE  Final    Comment: (NOTE) The Xpert SA Assay (FDA approved for NASAL specimens in patients 49 years of age and older), is one component of a comprehensive surveillance program. It is not intended to diagnose infection nor to guide or monitor treatment. Performed at [facility], [address] [place], [place] [address]   Culture, Respiratory w Gram Stain     Status: None   Collection Time: 06/21/22  9:02 AM   Specimen: Tracheal Aspirate; Respiratory  Result Value Ref Range Status   Specimen Description   Final    TRACHEAL ASPIRATE Performed at [facility], [address] [place], [place] [address]    Special Requests   Final    NONE Performed at [facility], [address] [place], [place] [address]    Gram Stain   Final    ABUNDANT WBC PRESENT, PREDOMINANTLY PMN MODERATE GRAM POSITIVE COCCI IN PAIRS RARE BUDDING YEAST SEEN RARE GRAM POSITIVE RODS    Culture   Final    FEW Normal respiratory flora-no Staph aureus or Pseudomonas seen Performed at [facility], [address], [place], [place] [address]    Report Status 06/23/2022 FINAL  Final    Labs: CBC: Recent Labs  Lab 06/29/22 0436 06/30/22 0709 07/01/22 0427 07/02/22 0606 07/03/22 0354  WBC 12.0* 10.6* 9.5 11.1* 12.7*  NEUTROABS 8.8* 7.4  --   --   --   HGB 10.7* 11.2* 11.0* 11.1* 11.2*  HCT 39.8 40.4 39.8 40.5 41.3  MCV 74.7* 74.5* 74.8* 75.6* 76.6*  PLT 381 380 364 359 0000000   Basic Metabolic Panel: Recent Labs  Lab 06/29/22 0436 06/30/22 0709 07/01/22 0427 07/02/22 0606 07/03/22 0354  NA 141 137 136 138 139  K 3.5 4.0 3.4* 3.7 3.4*  CL 98 95* 93* 98 99  CO2 33* 33* 34* 31 30  GLUCOSE 176* 155* 117* 151* 167*  BUN 31* 26* 23* 24* 24*  CREATININE 0.53 0.49 0.47 0.54 0.50  CALCIUM 9.4 9.1 9.2 9.0 9.1  MG 2.3 2.3  --   --   --   PHOS 5.6* 5.4* 6.3* 5.4* 4.7*   Liver Function Tests: Recent Labs  Lab 07/01/22 0427 07/02/22 0606 07/03/22 0354  ALBUMIN  2.9* 3.1* 3.1*   CBG: Recent Labs  Lab 07/04/22 2105 07/04/22 2343 07/05/22 0407 07/05/22 0801 07/05/22 1110  GLUCAP 141* 97 107* 110* 221*    Discharge time spent: greater than 30 minutes.  Signed:  Jennye Boroughs, MD [facility] 07/05/2022

## 2022-07-05 NOTE — Progress Notes (Signed)
Attempted to call report x 1  

## 2022-07-05 NOTE — Progress Notes (Signed)
PHARMACIST - PHYSICIAN COMMUNICATION  CONCERNING: IV to Oral Route Change Policy  RECOMMENDATION: This patient is receiving famotidine by the intravenous route.  Based on criteria approved by the Pharmacy and Therapeutics Committee, the intravenous medication(s) is/are being converted to the equivalent oral dose form(s).  DESCRIPTION: These criteria include: The patient is eating (either orally or via tube) and/or has been taking other orally administered medications for a least 24 hours The patient has no evidence of active gastrointestinal bleeding or impaired GI absorption (gastrectomy, short bowel, patient on TNA or NPO).  If you have questions about this conversion, please contact the [facility], [facility] 07/05/2022 10:23 AM

## 2022-07-05 NOTE — Progress Notes (Signed)
PMR Admission Coordinator Pre-Admission Assessment   Patient: Desiree Stone is an 47 y.o., female MRN: [id number] DOB: [birth year] Height: 4' 11"$  (149.9 cm) Weight: 129.2 kg   Insurance Information HMO:     PPO:      PCP:      IPA:      80/20:      OTHER:  PRIMARY: uninsured.  Provided verbal estimate cost of care on 2/13 and hard copy at time of admission  Policy#:       Subscriber:  CM Name:       Phone#:      Fax#:  Pre-Cert#:       Employer:  Benefits:  Phone #:      Name:  Eff. Date:      Deduct:       Out of Pocket Max:       Life Max:  CIR:       SNF:  Outpatient:      Co-Pay:  Home Health:       Co-Pay:  DME:      Co-Pay:  Providers:  SECONDARY:       Policy#:      Phone#:    Development worker, community:       Phone#:    The Engineer, petroleum" for patients in Inpatient Rehabilitation Facilities with attached "Privacy Act [place] Records" was provided and verbally reviewed with: N/A   Emergency Contact Information Contact Information       Name Relation Home Work Storden Father Kimball    Tamera Stands Mother [phone]   [phone]    Linnell Fulling Sister [phone]   [phone]           Current Medical History  Patient Admitting Diagnosis: debility    History of Present Illness: Pt is a 47 y/o female with PMH of asthma, DM, and HTN admitted to [facility] on 06/06/22 with c/o SOB progressing over the last few weeks.  On ED presentation pt hypertensive 136/99, HR 98, RR 24, satting 90% on 2L.  Initial workup remarkable for 134 Na, 189 glucose, 31 bicarb, 14.4 WBC.  Covid/flu/RSV negative.  CTA negative for PE but showed cardiomegaly and enlarged main pulmonary artery suggesting pulmonary HTN.  ABG reflective of severe respiratory acidosis and pt initially placed on bipap.  Follow up ABGs with persistent acidosis and decision was made to intubate.  Trach placed 1/29 and pt was gradually weaned off to TC.   Lurline Idol was downsized to shiley 6 uncuffed on 2/12. Echo on 1/17 showed EF 60-65%.  Pt required lasix for diuresis and currently on 40 mg daily.  Antibiotics completed for PNA.  Pt developed a complex fluid collection in LLQ on nonvascular ultrasounds, but no current indication for I&D, recommended warm compress.  Therapy ongoing and recommendations are for CIR>    Patient's medical record from [facility] has been reviewed by the rehabilitation admission coordinator and physician.   Past Medical History      Past Medical History:  Diagnosis Date   Asthma     Diabetes mellitus without complication ([place])     Hypertension        Has the patient had major surgery during 100 days prior to admission? Yes   Family History   family history includes Breast cancer (age of onset: 84) in her paternal aunt; Cancer in her paternal grandmother.   Current Medications   Current Facility-Administered Medications:    0.9 %  sodium chloride infusion, , Intravenous, PRN, Flora Lipps, MD, Stopped at 06/30/22 1100   acetaminophen (TYLENOL) tablet 650 mg, 650 mg, Oral, Q4H PRN **OR** acetaminophen (TYLENOL) suppository 650 mg, 650 mg, Rectal, Q6H PRN, Benita Gutter, RPH   atorvastatin (LIPITOR) tablet 40 mg, 40 mg, Oral, QHS, Benita Gutter, RPH   Chlorhexidine Gluconate Cloth 2 % PADS 6 each, 6 each, Topical, Q0600, Flora Lipps, MD, 6 each at 06/30/22 2217   enoxaparin (LOVENOX) injection 65 mg, 0.5 mg/kg, Subcutaneous, Q24H, Wynelle Cleveland, RPH, 65 mg at 07/04/22 2111   famotidine (PEPCID) tablet 20 mg, 20 mg, Oral, BID, Benita Gutter, RPH   feeding supplement (GLUCERNA 1.5 CAL) liquid 1,000 mL, 1,000 mL, Per Tube, Continuous, Sreenath, Sudheer B, MD, Stopped at 07/04/22 1141   free water 135 mL, 135 mL, Per Tube, Q4H, Sreenath, Sudheer B, MD   [START ON 07/06/2022] furosemide (LASIX) tablet 40 mg, 40 mg, Oral, Daily, Benita Gutter, RPH   Gerhardt's butt cream, , Topical, QID PRN, Dallie Piles,  RPH, 1 Application at [id number]   insulin aspart (novoLOG) injection 0-9 Units, 0-9 Units, Subcutaneous, Q4H, Dallie Piles, RPH, 1 Units at 07/04/22 2110   insulin aspart (novoLOG) injection 7 Units, 7 Units, Subcutaneous, Q4H, Dallie Piles, RPH, 7 Units at 07/05/22 0818   insulin glargine-yfgn (SEMGLEE) injection 30 Units, 30 Units, Subcutaneous, BID, Dallie Piles, RPH, 30 Units at 07/05/22 0818   ipratropium-albuterol (DUONEB) 0.5-2.5 (3) MG/3ML nebulizer solution 3 mL, 3 mL, Nebulization, BID, Sreenath, Sudheer B, MD, 3 mL at 07/05/22 0806   loperamide HCl (IMODIUM) 1 MG/7.5ML suspension 4 mg, 4 mg, Oral, PRN, Benita Gutter, RPH   magic mouthwash w/lidocaine, 5 mL, Oral, TID PRN, Dallie Piles, RPH, 5 mL at 07/03/22 1119   ondansetron (ZOFRAN) tablet 4 mg, 4 mg, Per Tube, Q6H PRN **OR** ondansetron (ZOFRAN) injection 4 mg, 4 mg, Intravenous, Q6H PRN, Rust-Chester, Huel Cote, NP   Oral care mouth rinse, 15 mL, Mouth Rinse, PRN, Flora Lipps, MD   oxyCODONE (Oxy IR/ROXICODONE) immediate release tablet 10 mg, 10 mg, Oral, Q6H PRN, Chappell, Alex B, RPH   senna-docusate (Senokot-S) tablet 2 tablet, 2 tablet, Oral, QHS PRN, Benita Gutter, RPH   sertraline (ZOLOFT) tablet 50 mg, 50 mg, Oral, QHS, Chappell, Alex B, RPH   sodium chloride flush (NS) 0.9 % injection 3 mL, 3 mL, Intravenous, Q12H, Kasa, Kurian, MD, 3 mL at 07/05/22 0818   Patients Current Diet:  Diet Order                  DIET - DYS 1 Room service appropriate? Yes with Assist; Fluid consistency: Thin  Diet effective now                         Precautions / Restrictions Precautions Precautions: Fall Restrictions Weight Bearing Restrictions: No    Has the patient had 2 or more falls or a fall with injury in the past year? No   Prior Activity Level Community (5-7x/wk): working FT, driving, no DME   Prior Functional Level Self Care: Did the patient need help bathing, dressing, using the toilet or  eating? Independent   Indoor Mobility: Did the patient need assistance with walking from room to room (with or without device)? Independent   Stairs: Did the patient need assistance with internal or external stairs (with or without device)? Independent   Functional Cognition: Did  the patient need help planning regular tasks such as shopping or remembering to take medications? Independent   Patient Information Are you of Hispanic, Latino/a,or Spanish origin?: A. No, not of Hispanic, Latino/a, or Spanish origin What is your race?: B. Black or African American Do you need or want an interpreter to communicate with a doctor or health care staff?: 0. No   Patient's Response To:  Health Literacy and Transportation Is the patient able to respond to health literacy and transportation needs?: Yes Health Literacy - How often do you need to have someone help you when you read instructions, pamphlets, or other written material from your doctor or pharmacy?: Never In the past 12 months, has lack of transportation kept you from medical appointments or from getting medications?: No In the past 12 months, has lack of transportation kept you from meetings, work, or from getting things needed for daily living?: No   Development worker, international aid / [place] Devices/Equipment: None Home Equipment: None   Prior Device Use: Indicate devices/aids used by the patient prior to current illness, exacerbation or injury? None of the above   Current Functional Level Cognition   Overall Cognitive Status: Within Functional Limits for tasks assessed Difficult to assess due to: Tracheostomy Orientation Level: Oriented X4 General Comments: PMSV in place. patient confirms that she lives with her children with 2-3 steps to enter her home with no rails. her mother can assist her at discharge.    Extremity Assessment (includes Sensation/Coordination)   Upper Extremity Assessment: Generalized weakness, Overall  WFL for tasks assessed  Lower Extremity Assessment: Defer to PT evaluation     ADLs   Overall ADL's : Needs assistance/impaired Lower Body Dressing: Minimal assistance Lower Body Dressing Details (indicate cue type and reason): anticipate overall MIN A; pt able to lean forward and on BIL socks today with extra time; OT assisted MIN A on L foot to push up sock. Toileting- Clothing Manipulation and Hygiene: Bed level, Total assistance, +2 for physical assistance Toileting - Clothing Manipulation Details (indicate cue type and reason): pt with bowel incontinence in bed; nursing staff x2 assisting pt for hygiene; new purewick placed General ADL Comments: MIN A + RW toilet t/f, +2 for safety. MOD A for LB access seated EOB. Requires BUE support static standing     Mobility   Overal bed mobility: Needs Assistance Bed Mobility: Supine to Sit Rolling: Min assist, Mod assist Supine to sit: Supervision Sit to supine: Mod assist, +2 for physical assistance General bed mobility comments: patient sitting up on arrival and post session     Transfers   Overall transfer level: Needs assistance Equipment used: Rolling walker (2 wheels) Transfers: Sit to/from Stand Sit to Stand: Min assist, +2 physical assistance Bed to/from chair/wheelchair/BSC transfer type:: Stand pivot Squat pivot transfers: Mod assist, +2 physical assistance Transfer via Lift Equipment: [place] transfer comment: verbal cues for hand placement and anterior weight shifting     Ambulation / Gait / Stairs / Wheelchair Mobility   Ambulation/Gait Ambulation/Gait assistance: Min assist, +2 safety/equipment (second person for chair follow) Gait Distance (Feet): 40 Feet (x 2 bouts) Assistive device: Rolling walker (2 wheels) (bariatric rolling walker) Gait Pattern/deviations: Step-to pattern, Decreased stance time - left General Gait Details: steadying assistance provided with cues for sequencing of BLE and rolling walker and  offloading LLE for safety due to continued leg numbness. seated rest break requried between bouts of walking. PMSV in place and patient ambulating while on trach mask oxygen,  Fi0235%, 6 L02. Sp02 93% or higher with heart rate up to 120's Gait velocity: decreased Pre-gait activities: L lateral weight shifts on last 2 standing bouts (x10/bout)     Posture / Balance Dynamic Sitting Balance Sitting balance - Comments: right lateral lean, likely due to air mattress settings. with feet support, sitting balance is fair Balance Overall balance assessment: Needs assistance Sitting-balance support: Feet supported Sitting balance-Leahy Scale: Good Sitting balance - Comments: right lateral lean, likely due to air mattress settings. with feet support, sitting balance is fair Standing balance support: Bilateral upper extremity supported Standing balance-Leahy Scale: Fair Standing balance comment: with RW for UE support     Special needs/care consideration Oxygen 35% trach collar, Trach size 6 shiley uncuffed, Skin trach site, and Diabetic management yes    Previous Home Environment (from acute therapy documentation) Living Arrangements: Alone Available Help at Discharge: Family Type of Home: Apartment Home Layout: One level Home Access: Level entry Bathroom Shower/Tub: Chiropodist: Haigler: No   Discharge Living Setting Plans for Discharge Living Setting: Patient's home (mom to stay with her) Type of Home at Discharge: House Discharge Home Layout: One level Discharge Home Access: Stairs to enter Entrance Stairs-Rails: None Entrance Stairs-Number of Steps: 3 Discharge Bathroom Shower/Tub: Tub/shower unit Discharge Bathroom Toilet: Standard Discharge Bathroom Accessibility:  (unsure, states maybe a RW but would be tight) Does the patient have any problems obtaining your medications?: No   Social/Family/Support Systems Anticipated Caregiver: pt's mom,  Leafy Kindle Anticipated Caregiver's Contact Information: [phone] Ability/Limitations of Caregiver: min assist Caregiver Availability: 24/7 Discharge Plan Discussed with Primary Caregiver: Yes Is Caregiver In Agreement with Plan?: Yes Does Caregiver/Family have Issues with Lodging/Transportation while Pt is in Rehab?: No   Goals Patient/Family Goal for Rehab: PT/OT supervision to mod I, SLP mod I Expected length of stay: 8-10 days Additional Information: new trach.  Dispo: plan to d/c to pt's home, with pt's mom, Stanton Kidney, to provide 24/7 supervision if needed Pt/Family Agrees to Admission and willing to participate: Yes Program Orientation Provided & Reviewed with Pt/Caregiver Including Roles  & Responsibilities: Yes  Barriers to Discharge: Trach, Home environment access/layout, Insurance for SNF coverage   Decrease burden of Care through IP rehab admission: nA   Possible need for SNF placement upon discharge: Not anticipated.  Plan to discharge to pt's home with mom, Stanton Kidney, coming to provide initial 24/7 support at discharge.    Patient Condition: I have reviewed medical records from [facility], spoken with CM, and patient and family member. I discussed via phone for inpatient rehabilitation assessment.  Patient will benefit from ongoing PT, OT, and SLP, can actively participate in 3 hours of therapy a day 5 days of the week, and can make measurable gains during the admission.  Patient will also benefit from the coordinated team approach during an Inpatient Acute Rehabilitation admission.  The patient will receive intensive therapy as well as Rehabilitation physician, nursing, social worker, and care management interventions.  Due to bowel management, safety, skin/wound care, disease management, medication administration, pain management, and patient education the patient requires 24 hour a day rehabilitation nursing.  The patient is currently min assist with mobility and basic ADLs.  Discharge setting  and therapy post discharge at home with home health is anticipated.  Patient has agreed to participate in the Acute Inpatient Rehabilitation Program and will admit today.   Preadmission Screen Completed By:  Michel Santee, PT, DPT  07/05/2022 11:19 AM ______________________________________________________________________  Discussed status with Dr. Curlene Dolphin on 07/05/22  at 11:19 AM  and received approval for admission today.   Admission Coordinator:  Michel Santee, PT, DPT time 11:19 AM Sudie Grumbling 07/05/22     Assessment/Plan: Diagnosis: Debility due to respiratory failure, CHF, COPD Does the need for close, 24 hr/day Medical supervision in concert with the patient's rehab needs make it unreasonable for this patient to be served in a less intensive setting? Yes Co-Morbidities requiring supervision/potential complications: HAP, Hypokalemia, Diarrhea, DM type II, abdominal wall abscess Due to bladder management, bowel management, safety, skin/wound care, disease management, medication administration, pain management, and patient education, does the patient require 24 hr/day rehab nursing? Yes Does the patient require coordinated care of a physician, rehab nurse, PT, OT, and SLP to address physical and functional deficits in the context of the above medical diagnosis(es)? Yes Addressing deficits in the following areas: balance, endurance, locomotion, strength, transferring, bowel/bladder control, bathing, dressing, feeding, grooming, toileting, speech, swallowing, and psychosocial support Can the patient actively participate in an intensive therapy program of at least 3 hrs of therapy 5 days a week? Yes The potential for patient to make measurable gains while on inpatient rehab is excellent Anticipated functional outcomes upon discharge from inpatient rehab: modified independent and supervision PT, modified independent and supervision OT, modified independent SLP Estimated rehab length of stay to  reach the above functional goals is: 8-10 Anticipated discharge destination: Home 10. Overall Rehab/Functional Prognosis: good     MD Signature: Jennye Boroughs

## 2022-07-05 NOTE — Progress Notes (Cosign Needed Addendum)
Speech Language Pathology Treatment: Nada Boozer Speaking valve  Patient Details Name: Desiree Stone MRN: [id number] DOB: December 14, 1975 Today's Date: 07/05/2022 Time: [id number] SLP Time Calculation (min) (ACUTE ONLY): 20 min  Assessment / Plan / Recommendation Clinical Impression  Pt sitting up on side of bed w/ tray table in front of her. Pt's meal approximately half eaten. Pt reported no difficulties w/ her breakfast or dinner last night. PMSV donned upon SLP entrance. Pt appeared alert, motivated, and cooperative throughout treatment. Pt left sitting up on side of bed eating lunch w/ family member present.   Pt on 8L O2 via trach collar; FiO2 30%; afebrile; WBC slightly elevated (on 2/12).  Pt presented w/ an intermittent cough/throat clearing while talking w/ SLP/Staff PRIOR TO po's.    Pt explained general aspiration precautions and the need to wear PMV w/ all oral intake; she agreed verbally to the need for following all instructions/education. Pt fed self po trials of thin liquids and puree, taking breaks and monitoring her breathing.   Pt exhibited an inconsistent, delayed dry cough AS AT BASELINE PRIOR TO PO PRESENTATION during po trials of various consistencies. No decline in vocal quality nor O2 sats during/post po trials. No decline in overall status post po trials. Coughing did not appear immediate to the swallowing -- pt endorsed laryngopharyngeal irritation s/p large bore NGT and recent trach change; increased phlegm; intermittent coughing last night during sleep; increased coughing when laying down. Pt is at risk for aspiration/aspiration pneumonia in setting of oral intubation and lengthy illness. Oral phase appeared grossly [facility] for bolus management and timely A-P transfer for swallowing; oral clearing achieved w/ all consistencies.     Pt appears to present w/ potential risk for pharyngeal phase dysphagia in setting of lengthy illness and deconditioning, oral intubation,  tracheostomy, and pulmonary status at baseline.  Pt is able to wear the PMV PRN at this time(s/p trach downsize by ENT on 07/03/2022).   Recommend continue Dysphagia level 1 diet w/ thin liquids via Cup w/ STRICT aspiration precautions as posted in room (including small/single bites/sips, rest breaks as needed, clear well w/ f/u swallow as needed, reduce talking during po's); pt and Mother/NSG given education/handout on such as well. MUST wear the PMV for all oral intake. STOP oral intake if any decline in status or overt s/s of aspiration noted -- NSG updated, agreed.   Recommend SLP f/u for ongoing dysphagia treatment including objective swallowing assessment as indicated and diet upgrade to solid foods as soon as possible at next venue of care as pt is eager to eat solid foods again. MD updated on above POC and agreed. Pt agreed. Pt is due to d/c to CIR this afternoon per chart notes and pt report.    HPI HPI: Pt is a 47 year old female admitted with acute hypoxic and hypercapnic respiratory failure in the setting of acute COPD exacerbation, new onset CHF, Obesity, ongoing tobacco use, and suspected community-acquired pneumonia.  She endorsed that over the last several months, she has been experiencing gradually worsening shortness of breath and dyspnea on exertion in addition to orthopnea. In addition, she endorses a productive cough with thick sputum and palpitations.  Post admission, she required intubation and mechanical ventilation d/t ongoing Pulmonary decline.  Pt was orally intubated from 06/08/22-06/19/22 when tracheostomy was performed. Pt s/p tracheostomy; NGT remains in place.   CXR on 06/22/22: Bilateral mid and lower lung interstitial thickening with  left-greater-than-right basilar heterogeneous airspace opacities,  similar to prior. Findings could  represent atelectasis or pneumonia.  3. Small left pleural effusion. Update: ENT downsized pt's trach on 07/03/2022 to a Shiley #6 diamerer. She is  now wearing PMV. independently w/out discomfort; vocal quality is declined.      SLP Plan  Continue with current plan of care      Recommendations for follow up therapy are one component of a multi-disciplinary discharge planning process, led by the attending physician.  Recommendations may be updated based on patient status, additional functional criteria and insurance authorization.    Recommendations  Diet recommendations: Dysphagia 1 (puree);Thin liquid Liquids provided via: Cup Medication Administration: Crushed with puree Supervision: Patient able to self feed;Intermittent supervision to cue for compensatory strategies Compensations: Slow rate;Minimize environmental distractions;Small sips/bites Postural Changes and/or Swallow Maneuvers: Out of bed for meals;Seated upright 90 degrees;Upright 30-60 min after meal      Patient may use Passy-Muir Speech Valve: During all waking hours (remove during sleep) PMSV Supervision: Intermittent         Oral Care Recommendations: Oral care BID;Oral care before and after PO;Patient independent with oral care Follow Up Recommendations: Acute inpatient rehab (3hours/day) Assistance recommended at discharge: Frequent or constant Supervision/Assistance SLP Visit Diagnosis: Dysphagia, unspecified (R13.10) Plan: Continue with current plan of care          [place], Speech Pathology   Desiree Stone  07/05/2022, 2:34 PM

## 2022-07-05 NOTE — Progress Notes (Cosign Needed Addendum)
Speech Language Pathology Treatment: Nada Boozer Speaking valve  Patient Details Name: Desiree Stone MRN: [id number] DOB: 07-24-1975 Today's Date: 07/05/2022 Time: [id number] SLP Time Calculation (min) (ACUTE ONLY): 20 min  Assessment / Plan / Recommendation Clinical Impression  Pt seen today for PMSV treatment. Pt sitting up on side of bed w/ tray table in front of her. PMSV donned upon SLP entrance. Pt appeared alert, motivated, and cooperative throughout treatment. Pt left sitting up on side of bed eating lunch w/ family member present.   Pt on 8L O2 via trach collar; FiO2 30%; afebrile; WBC slightly elevated (on 2/12).   ENT downsized pt's trach on 07/03/2022 to a Shiley XLT #6 diameter uncuffed.    Pt reported wearing PMSV all waking hours. Pt tolerating PMSV well c/b stable/calm respiratory rate (unlabored) and NO overt declined respiratory presentation such as panting or wheezing. Pt engaged in conversation for several minutes without overt SOB/WOB. Noted intermittent coughing (decreased from yesterday) and pt report of continued irritation in her throat. Suspect potential impact of increased airflow and phlegm contributing to this presentation as pt reported having to cough and clear phlegm frequently. Noted pt's vocal quality min/mod hoarse/gravelly w/ reduced volume (mild whisper/strained presentation). Pt's volume appears slightly improved from yesterday afternoon. Noted pt's vocal quality improved after sips of thin liquids. Pt reported she has not noticed SOB or apparent difficulties breathing. Pt has reported frequent/harsh coughing for lengthy periods at a time in the months prior to this admit and minimally "raspy" voice at baseline d/t smoking (2-3 packs/day) and frequent coughing; her mother agreed.   Pt continued to wear PMSV for duration of treatment session w/ no apparent decline in status nor pt report of discomfort in breathing. Noted continued moderately strained vocal  quality w/ decreased volume. Pt educated on strategies for functional vocal quality including easy breathing and full breath support for speech (take breaks every 2-3 words), minimizing background noise (closing door, muting television), close proximity to communication partner and within line of sight, and speaking w/ normal/typical vocal quality rather than whispering to reduce vocal strain.  Educational handouts describing these strategies reviewed and given to pt. Pt demonstrated understanding c/b asking appropriate f/u questions and utilizing strategies for remainder of session w/ min verbal cues and model. Pt needs continued support and cues for follow through w/ strategies.    Acute ST services will continue to f/u during pt's admit for PMSV tolerance and education. Recommend pt f/u w/ voice therapy at next venue of care to support verbal communication in ADLs. Pt/RN updated and agreed.      HPI HPI: Pt is a 47 year old female admitted with acute hypoxic and hypercapnic respiratory failure in the setting of acute COPD exacerbation, new onset CHF, Obesity, ongoing tobacco use, and suspected community-acquired pneumonia.  She endorsed that over the last several months, she has been experiencing gradually worsening shortness of breath and dyspnea on exertion in addition to orthopnea. In addition, she endorses a productive cough with thick sputum and palpitations.  Post admission, she required intubation and mechanical ventilation d/t ongoing Pulmonary decline.  Pt was orally intubated from 06/08/22-06/19/22 when tracheostomy was performed. Pt s/p tracheostomy; NGT remains in place.   CXR on 06/22/22: Bilateral mid and lower lung interstitial thickening with  left-greater-than-right basilar heterogeneous airspace opacities,  similar to prior. Findings could represent atelectasis or pneumonia.  3. Small left pleural effusion. Update: ENT downsized pt's trach on 07/03/2022 to a Shiley #6 diamerer. She is now  wearing  PMV. independently w/out discomfort; vocal quality is declined.      SLP Plan  Continue with current plan of care      Recommendations for follow up therapy are one component of a multi-disciplinary discharge planning process, led by the attending physician.  Recommendations may be updated based on patient status, additional functional criteria and insurance authorization.    Recommendations         Patient may use Passy-Muir Speech Valve: During all waking hours (remove during sleep) PMSV Supervision: Intermittent         Oral Care Recommendations: Oral care BID;Oral care before and after PO;Patient independent with oral care Follow Up Recommendations: Acute inpatient rehab (3hours/day) Assistance recommended at discharge: Frequent or constant Supervision/Assistance SLP Visit Diagnosis: Dysphagia, unspecified (R13.10) Plan: Continue with current plan of care         Randall Hiss Graduate Clinician [place], Speech Pathology  Randall Hiss 07/05/2022, 2:20 PM   The information in this patient note, response to treatment, and overall treatment plan developed has been reviewed and agreed upon after reviewing documentation. This session was performed under the supervision of this licensed clinician.   Orinda Kenner, [place], [place] [phone] 07/06/2022; 10:51AM

## 2022-07-05 NOTE — H&P (Signed)
Physical Medicine and Rehabilitation Admission H&P    Chief Complaint  Patient presents with   Shortness of Breath    HPI: Desiree Stone is a 47 year old female with history of HTN, T2DM, super morbid obesity-BMI 44, who was admitted to [facility] on 06/06/22 with 3 week history of SOB, productive cough with thick sputum and palpitations. She she reported SOB/DOE for several months, orthopnea and sleeping in recliner for weeks as well as LE edema.   Work up negative for viral illness.  2 D echo showed EF 60-65% with biatrial dilatation.  CTA chest showed cardiomegaly with enlarged main PA s/o PAH and refluxed contrast into IVC suggesting right heart dysfunction.  Her respiratory status declined due to persistent acidosis despite use of BIPAP, IV diureses and required intubation by 06/07/22. Dr. Lanney Gins felt that acute respiratory failure was in setting of hypercapnia with hypoxemia and possible CHF on arrival in setting of severe morbid obesity, suspected COPD (reported ongoing tobacco use--2-3 PPD) and deconditioning with likely sleep apnea.  She was not able to tolerate home CPAP previoulsy.  She was treated with IV diuresis and IV Solu-Medrol for COPD exacerbation w/progressive pleural effusion. Severe tracheal  secretions positive for gram positive cocci and she was treated with ceftriaxone for CAP.   She failed extubation attempts 01/20 required reintubation same day and noted to have severe upper air edema.  Leucocytosis felt to be due to steroids which were d/c on 01/27.  She developed low grade fever on 01/28 and CT chest revealing BLL consolidation with RML subsegmental atelectasis without effusions or obstruction and was treated with pulmonary hygiene. Dr. Tami Ribas consulted for trach placement on 01/29.  She continued to have intermittent fevers with agitation and cefepime added for VAP on 02/01. She was maintained on tube feeds for nutritional support and poorly controlled BS treated with  IV insulin. She was weaned to  8 L oxygen per ATC  and ENT consulted to downsized trach to XLT #6 on 02/12. He recommends resuming CPAP prior to decannulation.   She is tolerating PMSV trials and continues to have moderately hoarse voice with intermittent coughing --cough reported to be at baseline.  .   She reported LLQ tenderness w/mass 02/12 and ultrasound showed 3.4 cm complex cystic lesion --question developing abscess, hematoma w/ possible superimposed infection or necrotic soft tissue mass. NGT got clogged and was removed. BSS done 02/13 and she was started on D1, thins with strict aspiration precautions. She reports her breathing is much improved. She denies pain. Lurline Idol continues to be uncomfortable at times. Therapy has been working with patient who continues to be limited by weakness, reports LLE numbness with prolonged sitting activity, fatigue with rest breaks and tachycardia with ambulating short distances. CIR recommended due to functional decline.    Review of Systems  Constitutional:  Negative for chills and fever.  HENT:  Negative for congestion, hearing loss and tinnitus.   Eyes:  Negative for blurred vision and double vision.  Respiratory:  Positive for cough. Negative for sputum production.   Cardiovascular:  Negative for chest pain and palpitations.  Gastrointestinal:  Negative for abdominal pain, constipation and diarrhea.  Genitourinary:  Negative for dysuria and urgency.  Musculoskeletal:  Positive for back pain.  Skin:  Negative for rash.  Neurological:  Positive for sensory change (left foot and focal areas right scalp.) and focal weakness (left foot weakness and weakness.). Negative for dizziness and headaches.  Psychiatric/Behavioral:  The patient has insomnia.  Past Medical History:  Diagnosis Date   Asthma    Chronic back pain    Diabetes mellitus without complication ([place])    Hypertension    Iron deficiency anemia    Leiomyoma    s/p uterine artery  emoblization   Sleep apnea    Tobacco use disorder     Past Surgical History:  Procedure Laterality Date   carpel tunnel Bilateral    COLONOSCOPY WITH PROPOFOL N/A 12/08/2014   Procedure: COLONOSCOPY WITH PROPOFOL;  Surgeon: Lollie Sails, MD;  Location: [facility] ENDOSCOPY;  Service: Endoscopy;  Laterality: N/A;   ESOPHAGOGASTRODUODENOSCOPY     TRACHEOSTOMY TUBE PLACEMENT N/A 06/19/2022   Procedure: TRACHEOSTOMY;  Surgeon: Beverly Gust, MD;  Location: [facility] ORS;  Service: ENT;  Laterality: N/A;    Family History  Problem Relation Age of Onset   Breast cancer Paternal Aunt 70   Cancer Paternal Grandmother        Colon    Social History:  Lives with 2 children 62 and 79 years old. Was  working as Geophysical data processor. She reports that she smokes 2-3 PPD (quit briefly for 7 months years ago).  Her smoking use included cigarettes. She has never used smokeless tobacco. She reports that she does not drink alcohol and does not use drugs.   Allergies  Allergen Reactions   Abilify [Aripiprazole] Other (See Comments)    Syncope   Effexor [Venlafaxine] Other (See Comments)    Hair Loss   Wellbutrin [Bupropion] Other (See Comments)    Acne     Medications Prior to Admission  Medication Sig Dispense Refill   albuterol (PROVENTIL HFA;VENTOLIN HFA) 108 (90 BASE) MCG/ACT inhaler Inhale into the lungs every 6 (six) hours as needed for wheezing or shortness of breath.     atorvastatin (LIPITOR) 40 MG tablet Take 40 mg by mouth at bedtime.     cetirizine (ZYRTEC) 10 MG tablet Take 10 mg by mouth daily.     cyanocobalamin 1000 MCG tablet Take 1,000 mcg by mouth daily.     enalapril (VASOTEC) 20 MG tablet Take 20 mg by mouth daily.     famotidine (PEPCID) 20 MG tablet Take 20 mg by mouth 2 (two) times daily.     fluticasone (FLOVENT HFA) 110 MCG/ACT inhaler Inhale 2 puffs into the lungs 2 (two) times daily.     hydrochlorothiazide (HYDRODIURIL) 25 MG tablet Take 25 mg by mouth daily.      hydrOXYzine (ATARAX) 25 MG tablet Take 25 mg by mouth 3 (three) times daily as needed.     insulin glargine-yfgn (SEMGLEE) 100 UNIT/ML injection Inject 0.3 mLs (30 Units total) into the skin 2 (two) times daily.     ipratropium (ATROVENT HFA) 17 MCG/ACT inhaler Inhale 2 puffs into the lungs every 4 (four) hours as needed for wheezing.     liraglutide (VICTOZA) 18 MG/3ML SOPN Inject 2.4 mg into the skin daily.     metFORMIN (GLUCOPHAGE-XR) 500 MG 24 hr tablet Take 1,000 mg by mouth 2 (two) times daily with a meal.      Home: Home Living Family/patient expects to be discharged to:: Private residence Living Arrangements: Alone Available Help at Discharge: Family Type of Home: Apartment Home Access: Level entry Home Layout: One level Bathroom Shower/Tub: Chiropodist: Standard Home Equipment: None   Functional History: Prior Function Prior Level of Function : Independent/Modified Independent, Driving Mobility Comments: patient is independent with ambulation, works as a Freight forwarder, drives ADLs Comments: Ind  with self care and IADLs   Functional Status:  Mobility: Bed Mobility Overal bed mobility: Needs Assistance Bed Mobility: Supine to Sit Rolling: Min assist, Mod assist Supine to sit: Supervision Sit to supine: Mod assist, +2 for physical assistance General bed mobility comments: not assessed as patient sitting on edge of bed throughout session Transfers Overall transfer level: Needs assistance Equipment used: None Transfers: Sit to/from Stand Sit to Stand: Min guard Bed to/from chair/wheelchair/BSC transfer type:: Stand pivot Squat pivot transfers: Mod assist, +2 physical assistance Transfer via Lift Equipment: Carlin transfer comment: patient complains of LLE numbness and feels it could be related to low back issues. educated patient on weight shifting in sitting, routine repositioning techniques as patient reports sitting too long in the same  position causes increased numbness in left leg. patient is able to weight shifting side to side and anteriorly independently with no loss of balance and with no UE support Ambulation/Gait Ambulation/Gait assistance: Min assist, +2 safety/equipment (second person for chair follow) Gait Distance (Feet): 40 Feet (x 2 bouts) Assistive device: Rolling walker (2 wheels) (bariatric rolling walker) Gait Pattern/deviations: Step-to pattern, Decreased stance time - left General Gait Details: steadying assistance provided with cues for sequencing of BLE and rolling walker and offloading LLE for safety due to continued leg numbness. seated rest break requried between bouts of walking. PMSV in place and patient ambulating while on trach mask oxygen, Fi0235%, 6 L02. Sp02 93% or higher with heart rate up to 120's Gait velocity: decreased Pre-gait activities: L lateral weight shifts on last 2 standing bouts (x10/bout)   ADL: ADL Overall ADL's : Needs assistance/impaired Lower Body Dressing: Minimal assistance Lower Body Dressing Details (indicate cue type and reason): anticipate overall MIN A; pt able to lean forward and on BIL socks today with extra time; OT assisted MIN A on L foot to push up sock. Toileting- Clothing Manipulation and Hygiene: Bed level, Total assistance, +2 for physical assistance Toileting - Clothing Manipulation Details (indicate cue type and reason): pt with bowel incontinence in bed; nursing staff x2 assisting pt for hygiene; new purewick placed General ADL Comments: MIN A + HHA simulated BSC t/f. MIN A tooth brushing standing sink side   Cognition: Cognition Overall Cognitive Status: Within Functional Limits for tasks assessed Orientation Level: Oriented X4 Cognition Arousal/Alertness: Awake/alert Behavior During Therapy: WFL for tasks assessed/performed Overall Cognitive Status: Within Functional Limits for tasks assessed General Comments: PMSV in place throughout Difficult to  assess due to: Tracheostomy   Physical Exam: Blood pressure (!) 158/99, pulse (!) 101, temperature 98.6 F (37 C), temperature source Oral, resp. rate 17, height 4' 11"$  (1.499 m), weight 131.1 kg, last menstrual period 05/30/2022, SpO2 99 %. Physical Exam   General:  No apparent distress, Obese HEENT: Head is normocephalic, atraumatic, PERRLA, EOMI, sclera anicteric, oral mucosa pink and moist, ext ear canals clear,  Neck: Supple without JVD or lymphadenopathy, trach with Passy-Muir valve in place Heart: Reg rate and rhythm. No murmurs rubs or gallops Chest: CTA bilaterally without wheezes, rales, or rhonchi; no distress, trach collar on 6 L O2 Abdomen: Soft, non-tender, non-distended, bowel sounds positive. LLQ with ruptured cyst with minimal induration.  Extremities: No clubbing, cyanosis, or edema. Pulses are 2+ Psych: Pt's affect is appropriate. Pt is cooperative Skin: Clean and intact without signs of breakdown Neuro:  Alert and oriented x 4, follows commands, cranial nerves II through XII intact, no aphasia Dysphonia noted but able to converse without SOB or DOE.  Functional memory, Strength 5 out of 5 in bilateral upper extremity Strength right lower extremity 4+ out of 5 proximal, 5 out of 5 distal Left lower extremity 4+ out of 5 proximal, 4+ out of 5 ankle PF and DF Sensation intact light touch in all 4 extremities however decreased over left foot in stocking glove type distribution FTN intact b/l Musculoskeletal:  No joint swelling or tenderness noted  Left upper extremity IV-appears okay  Results for orders placed or performed during the hospital encounter of 06/06/22 (from the past 48 hour(s))  Glucose, capillary     Status: Abnormal   Collection Time: 07/03/22  8:05 PM  Result Value Ref Range   Glucose-Capillary 168 (H) 70 - 99 mg/dL    Comment: Glucose reference range applies only to samples taken after fasting for at least 8 hours.   Comment 1 Notify RN    Glucose, capillary     Status: Abnormal   Collection Time: 07/03/22 11:59 PM  Result Value Ref Range   Glucose-Capillary 134 (H) 70 - 99 mg/dL    Comment: Glucose reference range applies only to samples taken after fasting for at least 8 hours.  Glucose, capillary     Status: Abnormal   Collection Time: 07/04/22  4:09 AM  Result Value Ref Range   Glucose-Capillary 173 (H) 70 - 99 mg/dL    Comment: Glucose reference range applies only to samples taken after fasting for at least 8 hours.  Glucose, capillary     Status: Abnormal   Collection Time: 07/04/22  8:34 AM  Result Value Ref Range   Glucose-Capillary 165 (H) 70 - 99 mg/dL    Comment: Glucose reference range applies only to samples taken after fasting for at least 8 hours.  Glucose, capillary     Status: Abnormal   Collection Time: 07/04/22 12:03 PM  Result Value Ref Range   Glucose-Capillary 148 (H) 70 - 99 mg/dL    Comment: Glucose reference range applies only to samples taken after fasting for at least 8 hours.  Glucose, capillary     Status: Abnormal   Collection Time: 07/04/22  6:20 PM  Result Value Ref Range   Glucose-Capillary 200 (H) 70 - 99 mg/dL    Comment: Glucose reference range applies only to samples taken after fasting for at least 8 hours.  Glucose, capillary     Status: Abnormal   Collection Time: 07/04/22  9:05 PM  Result Value Ref Range   Glucose-Capillary 141 (H) 70 - 99 mg/dL    Comment: Glucose reference range applies only to samples taken after fasting for at least 8 hours.   Comment 1 Notify RN   Glucose, capillary     Status: None   Collection Time: 07/04/22 11:43 PM  Result Value Ref Range   Glucose-Capillary 97 70 - 99 mg/dL    Comment: Glucose reference range applies only to samples taken after fasting for at least 8 hours.  Glucose, capillary     Status: Abnormal   Collection Time: 07/05/22  4:07 AM  Result Value Ref Range   Glucose-Capillary 107 (H) 70 - 99 mg/dL    Comment: Glucose  reference range applies only to samples taken after fasting for at least 8 hours.  Glucose, capillary     Status: Abnormal   Collection Time: 07/05/22  8:01 AM  Result Value Ref Range   Glucose-Capillary 110 (H) 70 - 99 mg/dL    Comment: Glucose reference range applies only to samples taken after  fasting for at least 8 hours.  Glucose, capillary     Status: Abnormal   Collection Time: 07/05/22 11:10 AM  Result Value Ref Range   Glucose-Capillary 221 (H) 70 - 99 mg/dL    Comment: Glucose reference range applies only to samples taken after fasting for at least 8 hours.   No results found.    Blood pressure (!) 158/99, pulse (!) 101, temperature 98.6 F (37 C), temperature source Oral, resp. rate 17, height 4' 11"$  (1.499 m), weight 131.1 kg, last menstrual period 05/30/2022, SpO2 99 %.  Medical Problem List and Plan: 1. Functional deficits secondary to Debility due to respiratory failure, CHF, COPD , Pneumonia   -patient may  shower  -ELOS/Goals: 8-10 days, PT and OT sup to mod I, SLP mod I  -Admit to CIR, PT/OT/SLP 2.  Antithrombotics: -DVT/anticoagulation:  Pharmaceutical: Lovenox             -antiplatelet therapy: N/A 3. Pain Management: Oxycodone 42m prn.  4. Mood/Behavior/Sleep: LCSW to follow for evaluation and support. Zoloft 568mdaily             -antipsychotic agents: N/A             --Melatonin prn insomnia. Prn Trazodone 5. Neuropsych/cognition: This patient is capable of making decisions on his own behalf. 6. Skin/Wound Care: Routine pressure relief measures.              --add juven for nutritional support.  7. Fluids/Electrolytes/Nutrition:  Monitor I/O. Check CMET in am 8.Hypercarbic respiratory failure s/p trach:  -Supplemental O2 to maintain O2 sats above 88% 9. COPD: Budesonide nebs d/c 02/09 and on duo nebs BID now. 10. Congestive heart failure: Strict I/O--lasix decreased to 40 mg/daily on 02/13. Daily weight.             --weight at admission 300  lbs-->discharge wt 284 lbs             --Monitor  for signs of overload. 11. T2DM: hgb A1c-9.0 and poorly controlled.              --continue Insulin Glargline 30 units BID w/meal coverage, 7 units novolog mealtime  12. Hypokalemia: Has been supplemented intermittently --recheck K+ in am.  13. Leucocytosis: Improved but persistent and trending back up. WBC 12.7 07/03/22             --monitor for fevers and other signs of infection.  14. Iron deficiency anemia: Recheck CBC in am. HGB 11.2 2/12 15.  Depression: Zoloft daily 16. Dysphagia, Dys 1/thin diet  -Continue SLP    PaBary LerichePA-C 07/05/2022  I have personally performed a face to face diagnostic evaluation of this patient and formulated the key components of the plan.  Additionally, I have personally reviewed laboratory data, imaging studies, as well as relevant notes and concur with the physician assistant's documentation above.  The patient's status has not changed from the original H&P.  Any changes in documentation from the acute care chart have been noted above.  YuJennye BoroughsMD, FAMellody Drown

## 2022-07-05 NOTE — Progress Notes (Signed)
Inpatient Rehabilitation Admission Medication Review by a Pharmacist  A complete drug regimen review was completed for this patient to identify any potential clinically significant medication issues.  High Risk Drug Classes Is patient taking? Indication by Medication  Antipsychotic Yes Compazine - prn N/V  Anticoagulant Yes Lovenox - VTE ppx  Antibiotic No   Opioid Yes Oxycodone - prn pain  Antiplatelet No   Hypoglycemics/insulin Yes Insulin - DM  Vasoactive Medication Yes Furosemide - Fluid/CHF  Chemotherapy No   Other Yes Atorvastatin - HLD Famotidine - Reflux Duo-neb - COPD Sertraline - Mood  Magic Mouthwash - prn mouth pain  Trazodone - prn sleep     Type of Medication Issue Identified Description of Issue Recommendation(s)  Drug Interaction(s) (clinically significant)     Duplicate Therapy     Allergy     No Medication Administration End Date     Incorrect Dose     Additional Drug Therapy Needed     Significant med changes from prior encounter (inform family/care partners about these prior to discharge). Enalapril / HCTZ PTA Inhalers Victoza  Metformin To be addressed at discharge from rehab  Other       Clinically significant medication issues were identified that warrant physician communication and completion of prescribed/recommended actions by midnight of the next day:  No  Pharmacist comments: None  Time spent performing this drug regimen review (minutes): 30 minutes  Thank you Anette Guarneri, PharmD

## 2022-07-05 NOTE — H&P (Shared)
Physical Medicine and Rehabilitation Admission H&P    Chief Complaint  Patient presents with   Shortness of Breath    HPI: Desiree Stone is a 47 year old female with history of HTN, T2DM, super morbid obesity-BMI 24, who was admitted to [facility] on 06/06/22 with 3 week history of SOB, productive cough with thick sputum and palpitations. She she reported SOB/DOE for several months, orthopnea and sleeping in recliner for weeks as well as LE edema.   Work up negative for viral illness.  2 D echo showed EF 60-65% with biatrial dilatation.  CTA chest showed cardiomegaly with enlarged main PA s/o PAH and refluxed contrast into IVC suggesting right heart dysfunction.  Her respiratory status declined due to persistent acidosis despite use of BIPAP, IV diureses and required intubation by 06/07/22. Dr. Lanney Gins felt that acute respiratory failure was in setting of hypercapnia with hypoxemia and possible CHF on arrival in setting of severe morbid obesity, suspected COPD (reported ongoing tobacco use--2-3 PPD) and deconditioning with likely sleep apnea.  She was treated with IV diuresis and IV Solu-Medrol for COPD exacerbation w/progressive pleural effusion. Severe tracheal  secretions positive for gram positive cocci and she was treated with ceftriaxone for CAP.   She failed extubation attempts 01/20 required reintubation same day and noted to have severe upper air edema.  Leucocytosis felt to be due to steroids which were d/c on 01/27.  She developed low grade fever on 01/28 and CT chest revealing BLL consolidation with RML subsegmental atelectasis without effusions or obstruction and was treated with pulmonary hygiene. Dr. Tami Ribas consulted for trach placement on 01/29.  She continued to have intermittent fevers with agitation and cefepime added for VAP on 02/01. She was maintained on tube feeds for nutritional support and poorly controlled BS treated with IV insulin. She was weaned to  8 L oxygen per ATC   and ENT consulted to downsized trach to XLT #6 on 02/12. He recommends resuming CPAP prior to decannulation.   She is tolerating PMSV trials and continues to have moderately hoarse voice with intermittent coughing --cough reported to be at baseline.  .   She reported LLQ tenderness w/mass 02/12 and ultrasound showed 3.4 cm complex cystic lesion --question developing abscess, hematoma w/ possible superimposed infection or necrotic soft tissue mass. NGT got clogged and was removed. BSS done 02/13 and she was started on D1, thins with strict aspiration precautions. Therapy has been working with patient who continues to be limited by weakness, reports LLE numbness with prolonged sitting activity, fatigue with rest breaks and tachycardia with ambulating short distances. CIR recommended due to functional decline.    Review of Systems  Constitutional:  Negative for chills and fever.  HENT:  Negative for hearing loss and tinnitus.   Eyes:  Negative for blurred vision and double vision.  Respiratory:  Positive for cough. Negative for sputum production.   Cardiovascular:  Negative for chest pain and palpitations.  Gastrointestinal:  Negative for abdominal pain, constipation and diarrhea.  Genitourinary:  Negative for dysuria.  Musculoskeletal:  Positive for back pain.  Skin:  Negative for rash.  Neurological:  Positive for sensory change (left foot and focal areas right scalp.) and focal weakness (left foot weakness and weakness.). Negative for dizziness and headaches.  Psychiatric/Behavioral:  The patient has insomnia.      Past Medical History:  Diagnosis Date   Asthma    Chronic back pain    Diabetes mellitus without complication ([place])  Hypertension    Iron deficiency anemia    Leiomyoma    s/p uterine artery emoblization   Sleep apnea    Tobacco use disorder     Past Surgical History:  Procedure Laterality Date   carpel tunnel Bilateral    COLONOSCOPY WITH PROPOFOL N/A 12/08/2014    Procedure: COLONOSCOPY WITH PROPOFOL;  Surgeon: Lollie Sails, MD;  Location: [facility] ENDOSCOPY;  Service: Endoscopy;  Laterality: N/A;   ESOPHAGOGASTRODUODENOSCOPY     TRACHEOSTOMY TUBE PLACEMENT N/A 06/19/2022   Procedure: TRACHEOSTOMY;  Surgeon: Beverly Gust, MD;  Location: [facility] ORS;  Service: ENT;  Laterality: N/A;    Family History  Problem Relation Age of Onset   Breast cancer Paternal Aunt 15   Cancer Paternal Grandmother        Colon    Social History:  Lives with 2 children 38 and 49 years old. Was  working as Geophysical data processor. She reports that she smokes 2-3 PPD (quit briefly for 7 months years ago).  Her smoking use included cigarettes. She has never used smokeless tobacco. She reports that she does not drink alcohol and does not use drugs.   Allergies  Allergen Reactions   Abilify [Aripiprazole] Other (See Comments)    Syncope   Effexor [Venlafaxine] Other (See Comments)    Hair Loss   Wellbutrin [Bupropion] Other (See Comments)    Acne     Medications Prior to Admission  Medication Sig Dispense Refill   albuterol (PROVENTIL HFA;VENTOLIN HFA) 108 (90 BASE) MCG/ACT inhaler Inhale into the lungs every 6 (six) hours as needed for wheezing or shortness of breath.     atorvastatin (LIPITOR) 40 MG tablet Take 40 mg by mouth at bedtime.     cetirizine (ZYRTEC) 10 MG tablet Take 10 mg by mouth daily.     cyanocobalamin 1000 MCG tablet Take 1,000 mcg by mouth daily.     enalapril (VASOTEC) 20 MG tablet Take 20 mg by mouth daily.     famotidine (PEPCID) 20 MG tablet Take 20 mg by mouth 2 (two) times daily.     fluticasone (FLOVENT HFA) 110 MCG/ACT inhaler Inhale 2 puffs into the lungs 2 (two) times daily.     hydrochlorothiazide (HYDRODIURIL) 25 MG tablet Take 25 mg by mouth daily.     hydrOXYzine (ATARAX) 25 MG tablet Take 25 mg by mouth 3 (three) times daily as needed.     insulin glargine-yfgn (SEMGLEE) 100 UNIT/ML injection Inject 0.3 mLs (30 Units total) into the  skin 2 (two) times daily.     ipratropium (ATROVENT HFA) 17 MCG/ACT inhaler Inhale 2 puffs into the lungs every 4 (four) hours as needed for wheezing.     liraglutide (VICTOZA) 18 MG/3ML SOPN Inject 2.4 mg into the skin daily.     metFORMIN (GLUCOPHAGE-XR) 500 MG 24 hr tablet Take 1,000 mg by mouth 2 (two) times daily with a meal.        Home: Home Living Family/patient expects to be discharged to:: Private residence Living Arrangements: Alone Available Help at Discharge: Family Type of Home: Apartment Home Access: Level entry Home Layout: One level Bathroom Shower/Tub: Chiropodist: Standard Home Equipment: None   Functional History: Prior Function Prior Level of Function : Independent/Modified Independent, Driving Mobility Comments: patient is independent with ambulation, works as a Freight forwarder, drives ADLs Comments: Ind with self care and IADLs  Functional Status:  Mobility: Bed Mobility Overal bed mobility: Needs Assistance Bed Mobility: Supine to Sit Rolling: Min  assist, Mod assist Supine to sit: Supervision Sit to supine: Mod assist, +2 for physical assistance General bed mobility comments: not assessed as patient sitting on edge of bed throughout session Transfers Overall transfer level: Needs assistance Equipment used: None Transfers: Sit to/from Stand Sit to Stand: Min guard Bed to/from chair/wheelchair/BSC transfer type:: Stand pivot Squat pivot transfers: Mod assist, +2 physical assistance Transfer via Lift Equipment: [place] transfer comment: patient complains of LLE numbness and feels it could be related to low back issues. educated patient on weight shifting in sitting, routine repositioning techniques as patient reports sitting too long in the same position causes increased numbness in left leg. patient is able to weight shifting side to side and anteriorly independently with no loss of balance and with no UE  support Ambulation/Gait Ambulation/Gait assistance: Min assist, +2 safety/equipment (second person for chair follow) Gait Distance (Feet): 40 Feet (x 2 bouts) Assistive device: Rolling walker (2 wheels) (bariatric rolling walker) Gait Pattern/deviations: Step-to pattern, Decreased stance time - left General Gait Details: steadying assistance provided with cues for sequencing of BLE and rolling walker and offloading LLE for safety due to continued leg numbness. seated rest break requried between bouts of walking. PMSV in place and patient ambulating while on trach mask oxygen, Fi0235%, 6 L02. Sp02 93% or higher with heart rate up to 120's Gait velocity: decreased Pre-gait activities: L lateral weight shifts on last 2 standing bouts (x10/bout)    ADL: ADL Overall ADL's : Needs assistance/impaired Lower Body Dressing: Minimal assistance Lower Body Dressing Details (indicate cue type and reason): anticipate overall MIN A; pt able to lean forward and on BIL socks today with extra time; OT assisted MIN A on L foot to push up sock. Toileting- Clothing Manipulation and Hygiene: Bed level, Total assistance, +2 for physical assistance Toileting - Clothing Manipulation Details (indicate cue type and reason): pt with bowel incontinence in bed; nursing staff x2 assisting pt for hygiene; new purewick placed General ADL Comments: MIN A + HHA simulated BSC t/f. MIN A tooth brushing standing sink side  Cognition: Cognition Overall Cognitive Status: Within Functional Limits for tasks assessed Orientation Level: Oriented X4 Cognition Arousal/Alertness: Awake/alert Behavior During Therapy: WFL for tasks assessed/performed Overall Cognitive Status: Within Functional Limits for tasks assessed General Comments: PMSV in place throughout Difficult to assess due to: Tracheostomy  Physical Exam: Blood pressure 135/85, pulse (!) 101, temperature 98.7 F (37.1 C), temperature source Oral, resp. rate 18, height  4' 11"$  (1.499 m), weight 129.2 kg, last menstrual period 05/30/2022, SpO2 98 %. Physical Exam Vitals and nursing note reviewed.  Constitutional:      Appearance: She is obese.  Pulmonary:     Effort: Pulmonary effort is normal.     Breath sounds: Normal breath sounds.  Abdominal:     Comments: LLQ with ruptured cyst with minimal induration. +BS  Neurological:     Mental Status: She is alert and oriented to person, place, and time.     Comments: Dysphonia noted but able to converse without SOB or DOE.      Results for orders placed or performed during the hospital encounter of 06/06/22 (from the past 48 hour(s))  Glucose, capillary     Status: Abnormal   Collection Time: 07/03/22  8:05 PM  Result Value Ref Range   Glucose-Capillary 168 (H) 70 - 99 mg/dL    Comment: Glucose reference range applies only to samples taken after fasting for at least 8 hours.   Comment 1 Notify  RN   Glucose, capillary     Status: Abnormal   Collection Time: 07/03/22 11:59 PM  Result Value Ref Range   Glucose-Capillary 134 (H) 70 - 99 mg/dL    Comment: Glucose reference range applies only to samples taken after fasting for at least 8 hours.  Glucose, capillary     Status: Abnormal   Collection Time: 07/04/22  4:09 AM  Result Value Ref Range   Glucose-Capillary 173 (H) 70 - 99 mg/dL    Comment: Glucose reference range applies only to samples taken after fasting for at least 8 hours.  Glucose, capillary     Status: Abnormal   Collection Time: 07/04/22  8:34 AM  Result Value Ref Range   Glucose-Capillary 165 (H) 70 - 99 mg/dL    Comment: Glucose reference range applies only to samples taken after fasting for at least 8 hours.  Glucose, capillary     Status: Abnormal   Collection Time: 07/04/22 12:03 PM  Result Value Ref Range   Glucose-Capillary 148 (H) 70 - 99 mg/dL    Comment: Glucose reference range applies only to samples taken after fasting for at least 8 hours.  Glucose, capillary     Status:  Abnormal   Collection Time: 07/04/22  6:20 PM  Result Value Ref Range   Glucose-Capillary 200 (H) 70 - 99 mg/dL    Comment: Glucose reference range applies only to samples taken after fasting for at least 8 hours.  Glucose, capillary     Status: Abnormal   Collection Time: 07/04/22  9:05 PM  Result Value Ref Range   Glucose-Capillary 141 (H) 70 - 99 mg/dL    Comment: Glucose reference range applies only to samples taken after fasting for at least 8 hours.   Comment 1 Notify RN   Glucose, capillary     Status: None   Collection Time: 07/04/22 11:43 PM  Result Value Ref Range   Glucose-Capillary 97 70 - 99 mg/dL    Comment: Glucose reference range applies only to samples taken after fasting for at least 8 hours.  Glucose, capillary     Status: Abnormal   Collection Time: 07/05/22  4:07 AM  Result Value Ref Range   Glucose-Capillary 107 (H) 70 - 99 mg/dL    Comment: Glucose reference range applies only to samples taken after fasting for at least 8 hours.  Glucose, capillary     Status: Abnormal   Collection Time: 07/05/22  8:01 AM  Result Value Ref Range   Glucose-Capillary 110 (H) 70 - 99 mg/dL    Comment: Glucose reference range applies only to samples taken after fasting for at least 8 hours.  Glucose, capillary     Status: Abnormal   Collection Time: 07/05/22 11:10 AM  Result Value Ref Range   Glucose-Capillary 221 (H) 70 - 99 mg/dL    Comment: Glucose reference range applies only to samples taken after fasting for at least 8 hours.   No results found.    Blood pressure 135/85, pulse (!) 101, temperature 98.7 F (37.1 C), temperature source Oral, resp. rate 18, height 4' 11"$  (1.499 m), weight 129.2 kg, last menstrual period 05/30/2022, SpO2 98 %.  Medical Problem List and Plan: 1. Functional deficits secondary to ***  -patient may *** shower  -ELOS/Goals: *** 2.  Antithrombotics: -DVT/anticoagulation:  Pharmaceutical: Lovenox  -antiplatelet therapy: N/A 3. Pain  Management: Oxycodone prn.  4. Mood/Behavior/Sleep: LCSW to follow for evaluation and support.   -antipsychotic agents: N/A  --Melatonin prn  insomnia.  5. Neuropsych/cognition: This patient is capable of making decisions on his own behalf. 6. Skin/Wound Care: Routine pressure relief measures.   --add juven for nutritional support.  7. Fluids/Electrolytes/Nutrition:  Monitor I/O. Check CMET in am 8.Hypercarbic respiratory failure s/p trach: 9. COPD: Budesonide nebs d/c 02/09 and on duo nebs BID now. 10. Congestive heart failure: Strict I/O--lasix decreased to 40 mg/daily on 02/13  --weight at admission 300 lbs-->discharge wt 284 lbs  --Monitor  for signs of overload. 11. T2DM: hgb A1c-9.0 and poorly controlled.   --continue Insulin Glargline 30 units BID w/meal coverage 12. Hypokalemia: Has been supplemented intermittently --recheck K+ in am.  13. Leucocytosis: Improved but persistent and trending back up  --monitor for fevers and other signs of infection.  14. Iron deficiency anemia: Recheck CBC in am.  15.  Depression: Zoloft daily    ***  Bary Leriche, PA-C 07/05/2022

## 2022-07-05 NOTE — Progress Notes (Signed)
Report called to CIR

## 2022-07-06 DIAGNOSIS — R5381 Other malaise: Secondary | ICD-10-CM

## 2022-07-06 LAB — CBC WITH DIFFERENTIAL/PLATELET
Abs Immature Granulocytes: 0 10*3/uL (ref 0.00–0.07)
Basophils Absolute: 0.1 10*3/uL (ref 0.0–0.1)
Basophils Relative: 1 %
Eosinophils Absolute: 0.2 10*3/uL (ref 0.0–0.5)
Eosinophils Relative: 2 %
HCT: 39.8 % (ref 36.0–46.0)
Hemoglobin: 11.2 g/dL — ABNORMAL LOW (ref 12.0–15.0)
Lymphocytes Relative: 24 %
Lymphs Abs: 2.1 10*3/uL (ref 0.7–4.0)
MCH: 21.5 pg — ABNORMAL LOW (ref 26.0–34.0)
MCHC: 28.1 g/dL — ABNORMAL LOW (ref 30.0–36.0)
MCV: 76.5 fL — ABNORMAL LOW (ref 80.0–100.0)
Monocytes Absolute: 0.5 10*3/uL (ref 0.1–1.0)
Monocytes Relative: 6 %
Neutro Abs: 5.8 10*3/uL (ref 1.7–7.7)
Neutrophils Relative %: 67 %
Platelets: 255 10*3/uL (ref 150–400)
RBC: 5.2 MIL/uL — ABNORMAL HIGH (ref 3.87–5.11)
RDW: 26.9 % — ABNORMAL HIGH (ref 11.5–15.5)
WBC: 8.6 10*3/uL (ref 4.0–10.5)
nRBC: 0 % (ref 0.0–0.2)
nRBC: 0 /100 WBC

## 2022-07-06 LAB — COMPREHENSIVE METABOLIC PANEL
ALT: 24 U/L (ref 0–44)
AST: 15 U/L (ref 15–41)
Albumin: 2.8 g/dL — ABNORMAL LOW (ref 3.5–5.0)
Alkaline Phosphatase: 74 U/L (ref 38–126)
Anion gap: 11 (ref 5–15)
BUN: 6 mg/dL (ref 6–20)
CO2: 28 mmol/L (ref 22–32)
Calcium: 9.1 mg/dL (ref 8.9–10.3)
Chloride: 95 mmol/L — ABNORMAL LOW (ref 98–111)
Creatinine, Ser: 0.48 mg/dL (ref 0.44–1.00)
GFR, Estimated: >60 mL/min (ref >60)
Glucose, Bld: 117 mg/dL — ABNORMAL HIGH (ref 70–99)
Potassium: 3.3 mmol/L — ABNORMAL LOW (ref 3.5–5.1)
Sodium: 134 mmol/L — ABNORMAL LOW (ref 135–145)
Total Bilirubin: 0.3 mg/dL (ref 0.3–1.2)
Total Protein: 6.9 g/dL (ref 6.5–8.1)

## 2022-07-06 LAB — GLUCOSE, CAPILLARY
Glucose-Capillary: 120 mg/dL — ABNORMAL HIGH (ref 70–99)
Glucose-Capillary: 208 mg/dL — ABNORMAL HIGH (ref 70–99)
Glucose-Capillary: 132 mg/dL — ABNORMAL HIGH (ref 70–99)
Glucose-Capillary: 131 mg/dL — ABNORMAL HIGH (ref 70–99)

## 2022-07-06 MED ORDER — ADULT MULTIVITAMIN W/MINERALS CH
1.0000 | ORAL_TABLET | Freq: Every day | ORAL | Status: DC
  Administered 2022-07-06 – 2022-07-13 (×8): 1 via ORAL
  Filled 2022-07-06 (×8): qty 1

## 2022-07-06 NOTE — Plan of Care (Deleted)
  Problem: RH Balance Goal: LTG Patient will maintain dynamic sitting balance (PT) Description: LTG:  Patient will maintain dynamic sitting balance with assistance during mobility activities (PT) Flowsheets (Taken 07/06/2022 1608) LTG: Pt will maintain dynamic sitting balance during mobility activities with:: Independent with assistive device  Goal: LTG Patient will maintain dynamic standing balance (PT) Description: LTG:  Patient will maintain dynamic standing balance with assistance during mobility activities (PT) Flowsheets (Taken 07/06/2022 1608) LTG: Pt will maintain dynamic standing balance during mobility activities with:: Supervision/Verbal cueing   Problem: Sit to Stand Goal: LTG:  Patient will perform sit to stand with assistance level (PT) Description: LTG:  Patient will perform sit to stand with assistance level (PT) Flowsheets (Taken 07/06/2022 1608) LTG: PT will perform sit to stand in preparation for functional mobility with assistance level: Independent with assistive device   Problem: RH Bed Mobility Goal: LTG Patient will perform bed mobility with assist (PT) Description: LTG: Patient will perform bed mobility with assistance, with/without cues (PT). Flowsheets (Taken 07/06/2022 1608) LTG: Pt will perform bed mobility with assistance level of: Independent with assistive device    Problem: RH Bed to Chair Transfers Goal: LTG Patient will perform bed/chair transfers w/assist (PT) Description: LTG: Patient will perform bed to chair transfers with assistance (PT). Flowsheets (Taken 07/06/2022 1608) LTG: Pt will perform Bed to Chair Transfers with assistance level: Independent with assistive device    Problem: RH Car Transfers Goal: LTG Patient will perform car transfers with assist (PT) Description: LTG: Patient will perform car transfers with assistance (PT). Flowsheets (Taken 07/06/2022 1608) LTG: Pt will perform car transfers with assist:: Set up assist    Problem: RH  Furniture Transfers Goal: LTG Patient will perform furniture transfers w/assist (OT/PT) Description: LTG: Patient will perform furniture transfers  with assistance (OT/PT). Flowsheets (Taken 07/06/2022 1608) LTG: Pt will perform furniture transfers with assist:: Independent with assistive device    Problem: RH Wheelchair Mobility Goal: LTG Patient will propel w/c in controlled environment (PT) Description: LTG: Patient will propel wheelchair in controlled environment, # of feet with assist (PT) Flowsheets (Taken 07/06/2022 1608) LTG: Pt will propel w/c in controlled environ  assist needed:: Independent with assistive device LTG: Propel w/c distance in controlled environment: 34f Goal: LTG Patient will propel w/c in community environment (PT) Description: LTG: Patient will propel wheelchair in community environment, # of feet with assist (PT) Flowsheets (Taken 07/06/2022 1611) LTG: Pt will propel w/c in community environ  assist needed:: Independent with assistive device Distance: wheelchair distance in controlled environment: 25   Problem: RH Stairs Goal: LTG Patient will ambulate up and down stairs w/assist (PT) Description: LTG: Patient will ambulate up and down # of stairs with assistance (PT) 07/06/2022 1613 by BSudie Grumbling Reyce Lubeck, Student-PT Flowsheets (Taken 07/06/2022 1613) LTG: Pt will  ambulate up and down number of stairs: 8 07/06/2022 1612 by BSudie Grumbling Nathanyal Ashmead, Student-PT Flowsheets (Taken 07/06/2022 1612) LTG: Pt will  ambulate up and down number of stairs: 3 07/06/2022 1611 by BSudie Grumbling Shalamar Plourde, Student-PT Flowsheets (Taken 07/06/2022 1611) LTG: Pt will ambulate up/down stairs assist needed:: Supervision/Verbal cueing LTG: Pt will  ambulate up and down number of stairs: 12

## 2022-07-06 NOTE — Evaluation (Signed)
Occupational Therapy Assessment and Plan  Patient Details  Name: Desiree Stone MRN: [id number] Date of Birth: August 14, 1975  OT Diagnosis: lumbago (low back pain) and muscle weakness (generalized) Rehab Potential: Rehab Potential (ACUTE ONLY): Good ELOS: 9-12 days   Today's Date: 07/06/2022 OT Individual Time:  -  800-900 60 min       Hospital Problem: Principal Problem:   Debility   Past Medical History:  Past Medical History:  Diagnosis Date   Asthma    Chronic back pain    Diabetes mellitus without complication ([place])    Hypertension    Iron deficiency anemia    Leiomyoma    s/p uterine artery emoblization   Sleep apnea    Tobacco use disorder    Past Surgical History:  Past Surgical History:  Procedure Laterality Date   carpel tunnel Bilateral    COLONOSCOPY WITH PROPOFOL N/A 12/08/2014   Procedure: COLONOSCOPY WITH PROPOFOL;  Surgeon: Lollie Sails, MD;  Location: [facility] ENDOSCOPY;  Service: Endoscopy;  Laterality: N/A;   ESOPHAGOGASTRODUODENOSCOPY     TRACHEOSTOMY TUBE PLACEMENT N/A 06/19/2022   Procedure: TRACHEOSTOMY;  Surgeon: Beverly Gust, MD;  Location: [facility] ORS;  Service: ENT;  Laterality: N/A;    Assessment & Plan Clinical Impression: Patient is a 47 year old female with acute hypoxic and hypercapnic respiratory failure, acute COPD exacerbation, new onset CHF, and suspected community-acquired pneumonia requiring intubation and mechanical ventilation. History of tobacco use   Patient currently requires min-max with basic self-care skills secondary to muscle weakness, decreased cardiorespiratoy endurance and decreased oxygen support, and decreased standing balance, decreased postural control, and decreased balance strategies.  Prior to hospitalization, patient could complete BADL/IADL with independent .  Patient will benefit from skilled intervention to decrease level of assist with basic self-care skills and increase independence with basic self-care  skills prior to discharge home with care partner.  Anticipate patient will require intermittent supervision and follow up home health.  OT - End of Session Activity Tolerance: Tolerates 30+ min activity with multiple rests Endurance Deficit: Yes OT Assessment Rehab Potential (ACUTE ONLY): Good OT Barriers to Discharge: Decreased caregiver support;Inaccessible home environment;Home environment access/layout OT Patient demonstrates impairments in the following area(s): Balance;Endurance;Skin Integrity;Sensory OT Basic ADL's Functional Problem(s): Eating;Grooming;Bathing;Dressing;Toileting OT Advanced ADL's Functional Problem(s): Simple Meal Preparation OT Transfers Functional Problem(s): Toilet;Tub/Shower OT Additional Impairment(s): Fuctional Use of Upper Extremity OT Plan OT Intensity: Minimum of 1-2 x/day, 45 to 90 minutes OT Frequency: 5 out of 7 days OT Duration/Estimated Length of Stay: 9-12 days OT Treatment/Interventions: Balance/vestibular training;Discharge planning;Pain management;Self Care/advanced ADL retraining;Therapeutic Activities;UE/LE Coordination activities;Visual/perceptual remediation/compensation;Therapeutic Exercise;Skin care/wound managment;Patient/family education;Functional mobility training;Disease mangement/prevention;Community reintegration;DME/adaptive equipment instruction;Neuromuscular re-education;Psychosocial support;Splinting/orthotics;UE/LE Strength taining/ROM;Wheelchair propulsion/positioning OT Self Feeding Anticipated Outcome(s): MOD I OT Basic Self-Care Anticipated Outcome(s): MOD I OT Toileting Anticipated Outcome(s): MOD I toileting, S shower OT Bathroom Transfers Anticipated Outcome(s): MOD I toilet; S shower OT Recommendation Patient destination: Home Follow Up Recommendations: Home health OT Equipment Recommended: 3 in 1 bedside comode;To be determined Equipment Details: no room for shower seat in shower per pt report   OT  Evaluation Precautions/Restrictions  Precautions Precautions: Fall Precaution Comments: watch O2 keep >90% Restrictions Weight Bearing Restrictions: No General Chart Reviewed: Yes Family/Caregiver Present: No Vital Signs Therapy Vitals Pulse Rate: (!) 110 Resp: 18 Patient Position (if appropriate): Sitting Oxygen Therapy SpO2: 98 % O2 Device: Tracheostomy Collar O2 Flow Rate (L/min): 8 L/min FiO2 (%): 35 % Pain Pain Assessment Pain Score: 0-No pain Home Living/Prior Functioning Home Living  Family/patient expects to be discharged to:: Private residence Living Arrangements: Alone Available Help at Discharge: Family Type of Home: Apartment Home Access: Level entry [place]: One level Bathroom Shower/Tub: Chiropodist: Standard Prior Function Level of Independence: Independent with basic ADLs, Independent with homemaking with ambulation  Able to Take Stairs?: Yes Driving: Yes Vision Baseline Vision/History: 0 No visual deficits Ability to See in Adequate Light: 0 Adequate Patient Visual Report: No change from baseline Vision Assessment?: No apparent visual deficits Perception  Perception: Within Functional Limits Praxis Praxis: Intact Cognition Cognition Overall Cognitive Status: Within Functional Limits for tasks assessed Arousal/Alertness: (P) Awake/alert Orientation Level: Person;Place;Situation Person: Oriented Place: Oriented Situation: Oriented Memory: (P) Appears intact Awareness: Appears intact Problem Solving: Appears intact Safety/Judgment: Appears intact Brief Interview for Mental Status (BIMS) Repetition of Three Words (First Attempt): 3 Temporal Orientation: Year: Correct Temporal Orientation: Month: Accurate within 5 days Temporal Orientation: Day: Correct Recall: "Sock": Yes, no cue required Recall: "Blue": Yes, no cue required Recall: "Bed": Yes, no cue required BIMS Summary Score: 15 Sensation Sensation Light  Touch: Impaired by gross assessment Additional Comments: impaired LLE Coordination Gross Motor Movements are Fluid and Coordinated: Yes Fine Motor Movements are Fluid and Coordinated: Yes Motor  Motor Motor: Within Functional Limits  Trunk/Postural Assessment  Cervical Assessment Cervical Assessment: Within Functional Limits Thoracic Assessment Thoracic Assessment: Within Functional Limits Lumbar Assessment Lumbar Assessment: Within Functional Limits Postural Control Postural Control: Deficits on evaluation  Balance Balance Balance Assessed: Yes Dynamic Sitting Balance Dynamic Sitting - Level of Assistance: 5: Stand by assistance Static Standing Balance Static Standing - Level of Assistance: 5: Stand by assistance;4: Min assist Dynamic Standing Balance Dynamic Standing - Level of Assistance: 4: Min assist Extremity/Trunk Assessment RUE Assessment RUE Assessment: Within Functional Limits LUE Assessment LUE Assessment: Within Functional Limits  Care Tool Care Tool Self Care Eating        Oral Care    Oral Care Assist Level: Minimal Assistance - Patient > 75%    Bathing   Body parts bathed by patient: Right arm;Left arm;Chest;Abdomen;Front perineal area;Right upper leg;Left upper leg;Face Body parts bathed by helper: Buttocks;Right lower leg;Left lower leg   Assist Level: Moderate Assistance - Patient 50 - 74%    Upper Body Dressing(including orthotics)   What is the patient wearing?: Pull over shirt   Assist Level: Supervision/Verbal cueing    Lower Body Dressing (excluding footwear)   What is the patient wearing?: Pants Assist for lower body dressing: Maximal Assistance - Patient 25 - 49%    Putting on/Taking off footwear   What is the patient wearing?: Socks;Shoes Assist for footwear: Total Assistance - Patient < 25%       Care Tool Toileting Toileting activity   Assist for toileting: Maximal Assistance - Patient 25 - 49%     Care Tool Bed  Mobility Roll left and right activity        Sit to lying activity        Lying to sitting on side of bed activity         Care Tool Transfers Sit to stand transfer   Sit to stand assist level: Minimal Assistance - Patient > 75%    Chair/bed transfer   Chair/bed transfer assist level: Minimal Assistance - Patient > 75%     Toilet transfer   Assist Level: Minimal Assistance - Patient > 75%     Care Tool Cognition  Expression of Ideas and Wants    Understanding Verbal  and Non-Verbal Content     Memory/Recall Ability     Refer to Care Plan for Long Term Goals  SHORT TERM GOAL WEEK 1 OT Short Term Goal 1 (Week 1): Pt will thread BL Einto pants with AE PRN OT Short Term Goal 2 (Week 1): Pt will complete oral care in standing with no change in vitals to demo improved endurance OT Short Term Goal 3 (Week 1): Pt will complete 1/3 steps of toileting OT Short Term Goal 4 (Week 1): Pt will transfer to toilet with S and LRAD  Recommendations for other services: Therapeutic Recreation  Pet therapy, Kitchen group, and Outing/community reintegration   Skilled Therapeutic Intervention ADL ADL Eating: Set up Grooming: Minimal assistance Where Assessed-Grooming: Standing at sink Upper Body Bathing: Supervision/safety Where Assessed-Upper Body Bathing: Edge of bed Lower Body Bathing: Moderate assistance Where Assessed-Lower Body Bathing: Edge of bed Upper Body Dressing: Supervision/safety Where Assessed-Upper Body Dressing: Edge of bed Lower Body Dressing: Maximal assistance Where Assessed-Lower Body Dressing: Edge of bed Toileting: Maximal assistance Where Assessed-Toileting: Bedside Commode Toilet Transfer: Minimal assistance Toilet Transfer Method: Stand pivot Mobility  Transfers Sit to Stand: Minimal Assistance - Patient > 75% Stand to Sit: Minimal Assistance - Patient > 75%  1:1. Pt educated on OT role/purpose, CIR, ELOS, and debility recovery. Pt completes BADL at EOB  as pt states she is nervous to shower. Pt educated on DME for shower here and discusses DC setting with OT. Pt moves very well but has decreased endurance and reliant on use of walker or tray table for balance during cleansing peri/buttocks. Pt would benefit from toilet tongs to improve reach. Pt provided elastic laces to be installed in shoes and educated on use of AE to improve dressing tomorrow. Exited session with pt seated in EOB, exit alarm on and call light in reach  Discharge Criteria: Patient will be discharged from OT if patient refuses treatment 3 consecutive times without medical reason, if treatment goals not met, if there is a change in medical status, if patient makes no progress towards goals or if patient is discharged from hospital.  The above assessment, treatment plan, treatment alternatives and goals were discussed and mutually agreed upon: by patient  Tonny Branch 07/06/2022, 10:31 AM

## 2022-07-06 NOTE — Progress Notes (Signed)
Inpatient Rehabilitation Care Coordinator Assessment and Plan Patient Details  Name: Desiree Stone MRN: [id number] Date of Birth: Nov 29, 1975  Today's Date: 07/06/2022  Hospital Problems: Principal Problem:   Debility  Past Medical History:  Past Medical History:  Diagnosis Date   Asthma    Chronic back pain    Diabetes mellitus without complication ([place])    Hypertension    Iron deficiency anemia    Leiomyoma    s/p uterine artery emoblization   Sleep apnea    Tobacco use disorder    Past Surgical History:  Past Surgical History:  Procedure Laterality Date   carpel tunnel Bilateral    COLONOSCOPY WITH PROPOFOL N/A 12/08/2014   Procedure: COLONOSCOPY WITH PROPOFOL;  Surgeon: Lollie Sails, MD;  Location: [facility] ENDOSCOPY;  Service: Endoscopy;  Laterality: N/A;   ESOPHAGOGASTRODUODENOSCOPY     TRACHEOSTOMY TUBE PLACEMENT N/A 06/19/2022   Procedure: TRACHEOSTOMY;  Surgeon: Beverly Gust, MD;  Location: [facility] ORS;  Service: ENT;  Laterality: N/A;   Social History:  reports that she quit smoking about 5 years ago. Her smoking use included cigarettes. She has never used smokeless tobacco. She reports that she does not drink alcohol and does not use drugs.  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 Close with mother, sister, children and grandchildren Education: Nurse, mental health - How often do you need to have someone help you when you read instructions, pamphlets, or other written material from your doctor or pharmacy?: Never Writes: Yes Employment Status:  Employed Name of Employer: [employer], [employer]) Return to Work Plans: TBD Public relations account executive Issues: N/A Guardian/Conservator: N/A   Abuse/Neglect Abuse/Neglect Assessment Can Be Completed: Yes Physical Abuse: Denies Verbal Abuse: Denies Sexual Abuse: Denies Exploitation of patient/patient's resources: Denies Self-Neglect: Denies  Patient response to: Social Isolation - How often do you feel lonely or isolated from those around you?: Never  Emotional Status Pt's affect, behavior and adjustment status: Pleasant Recent Psychosocial Issues: Coping Psychiatric History: N/A Substance Abuse History: N/A  Patient / Family Perceptions, Expectations & Goals Pt/Family understanding of illness & functional limitations: yes Premorbid pt/family roles/activities: Previously independent, working and in school Anticipated changes in roles/activities/participation: Patient anticipates assistance from her mother at d/c Pt/family expectations/goals: Supervision to [place]: None Premorbid Home Care/DME Agencies: None Transportation available at discharge: mother or children Is the patient able to respond to transportation needs?: Yes In the past 12 months, has lack of transportation kept you from medical appointments or from getting medications?: No In the past 12 months, has lack of transportation kept you from meetings, work, or from getting things needed for daily living?: No Resource referrals recommended: Neuropsychology  Discharge Planning Living Arrangements: Alone Support Systems: Parent, Children Type of Residence: Private residence Insurance Resources: Teacher, adult education Resources: Employment Financial Screen Referred: Yes Living Expenses: Education officer, community Management: Patient Does the patient have any problems obtaining your medications?: Yes (Describe) (uninsured) Home Management: Independent Patient/Family Preliminary  Plans: Plans to manage Care Coordinator Barriers to Discharge: Lack of/limited family support, Insurance for SNF coverage Care Coordinator Anticipated Follow Up Needs: HH/OP Expected length of stay: 8-10 Days  Clinical Impression Sw met with  patient, introduced self and explained role. Patient anticipates discharging home with assistance from mother. Mother plans to come and stay with patient at d/c able to assist up to [place]. Patient is uninsured, no additional questions or concerns.   [name] 07/06/2022, 12:27 PM

## 2022-07-06 NOTE — Progress Notes (Signed)
[facility] Individual Statement of Services  Patient Name:  Desiree Stone  Date:  07/06/2022  Welcome to the [facility].  Our goal is to provide you with an individualized program based on your diagnosis and situation, designed to meet your specific needs.  With this comprehensive rehabilitation program, you will be expected to participate in at least 3 hours of rehabilitation therapies Monday-Friday, with modified therapy programming on the weekends.  Your rehabilitation program will include the following services:  Physical Therapy (PT), Occupational Therapy (OT), Speech Therapy (ST), 24 hour per day rehabilitation nursing, Therapeutic Recreaction (TR), Neuropsychology, Care Coordinator, Rehabilitation Medicine, Nutrition Services, Pharmacy Services, and Other  Weekly team conferences will be held on Wednesdays to discuss your progress.  Your Inpatient Rehabilitation Care Coordinator will talk with you frequently to get your input and to update you on team discussions.  Team conferences with you and your family in attendance may also be held.  Expected length of stay:  8-10 Days  Overall anticipated outcome: supervision to mod I   Depending on your progress and recovery, your program may change. Your Inpatient Rehabilitation Care Coordinator will coordinate services and will keep you informed of any changes. Your Inpatient Rehabilitation Care Coordinator's name and contact numbers are listed  below.  The following services may also be recommended but are not provided by the [facility]:   [place] will be made to provide these services after discharge if needed.  Arrangements include referral to agencies that provide these services.  Your insurance has been verified to be:   uninsured Your primary doctor is:  [facility]   Pertinent  information will be shared with your doctor and your insurance company.  Inpatient Rehabilitation Care Coordinator:  Erlene Quan, [place] or [phone]  Information discussed with and copy given to patient by: [name], 07/06/2022, 10:58 AM

## 2022-07-06 NOTE — Progress Notes (Signed)
Inpatient Rehabilitation  Patient information reviewed and entered into eRehab system by Javarian Jakubiak M. Liv Rallis, M.A., CCC/SLP, PPS Coordinator.  Information including medical coding, functional ability and quality indicators will be reviewed and updated through discharge.    

## 2022-07-06 NOTE — Evaluation (Signed)
Speech Language Pathology Assessment and Plan  Patient Details  Name: Desiree Stone MRN: [id number] Date of Birth: Oct 08, 1975  SLP Diagnosis: Dysphagia;Voice disorder  Rehab Potential: Excellent ELOS: 9-12 days   Today's Date: 07/06/2022 SLP Individual Time: 1100-1210 SLP Individual Time Calculation (min): 70 min  Hospital Problem: Principal Problem:   Debility  Past Medical History:  Past Medical History:  Diagnosis Date   Asthma    Chronic back pain    Diabetes mellitus without complication ([place])    Hypertension    Iron deficiency anemia    Leiomyoma    s/p uterine artery emoblization   Sleep apnea    Tobacco use disorder    Past Surgical History:  Past Surgical History:  Procedure Laterality Date   carpel tunnel Bilateral    COLONOSCOPY WITH PROPOFOL N/A 12/08/2014   Procedure: COLONOSCOPY WITH PROPOFOL;  Surgeon: Lollie Sails, MD;  Location: [facility] ENDOSCOPY;  Service: Endoscopy;  Laterality: N/A;   ESOPHAGOGASTRODUODENOSCOPY     TRACHEOSTOMY TUBE PLACEMENT N/A 06/19/2022   Procedure: TRACHEOSTOMY;  Surgeon: Beverly Gust, MD;  Location: [facility] ORS;  Service: ENT;  Laterality: N/A;    Assessment / Plan / Recommendation Clinical Impression Desiree Stone is a 47 year old female with history of HTN, T2DM, super morbid obesity-BMI 36, who was admitted to [facility] on 06/06/22 with 3 week history of SOB, productive cough with thick sputum and palpitations. She she reported SOB/DOE for several months, orthopnea and sleeping in recliner for weeks as well as LE edema.  Her respiratory status declined due to persistent acidosis despite use of BIPAP, IV diureses and required intubation by 06/07/22. Dr. Lanney Gins felt that acute respiratory failure was in setting of hypercapnia with hypoxemia and possible CHF on arrival in setting of severe morbid obesity, suspected COPD (reported ongoing tobacco use--2-3 PPD) and deconditioning with likely sleep apnea.  She failed  extubation attempts 01/20 required reintubation same day and noted to have severe upper air edema. Dr. Tami Ribas consulted for trach placement on 01/29.  She was maintained on tube feeds for nutritional support and poorly controlled BS treated with IV insulin. She was weaned to  8 L oxygen per ATC  and ENT consulted to downsized trach to XLT #6 on 02/12. He recommends resuming CPAP prior to decannulation. She is tolerating PMSV trials and continues to have moderately hoarse voice with intermittent coughing --cough reported to be at baseline. BSS done 02/13 and she was started on D1, thins with strict aspiration precautions. She reports her breathing is much improved. She denies pain. CIR recommended due to functional decline.   Speech/PMSV: Patient presented with a #6 uncuffed trach with PMSV in place upon arrival. PMSV remained in place for duration of session with vitals remaining WFL. Pt maintaining an O2 saturation of 97-100%; HR 100-104. Patient without evidence of air trapping. Pt was intelligible and breath support was adequate for verbal expression at the sentence level. Pt's vocal quality was characterized as hoarse and with reduced vocal intensity. Pt feels her current vocal quality is ~40-50% different from baseline. Pt supports a mildly "raspy" voice at baseline secondary to smoking 3 packs/day and frequent coughing. Pt was able to donn/doff PMSV with supervision A verbal cues. Pt is very eager/hopeful for trach removal. It appears there is discussion of capping trials very soon, possible even today. Continue PMSV during all waking hours and with all PO intake; remove with sleep.   Swallowing: Pt's OME was unremarkable. Of note, pt exhibited an intermittent  dry cough at baseline PRIOR to consumption of PO trials. Oropharyngeal swallow function appeared grossly [facility] for consumption of thin liquids via cup and straw, puree, and dys 2/3 textures. Pt exhibited delayed cough x1 following solids, however  difficult to determine if attributed to POs d/t dry cough at baseline. Vocal quality remained clear post swallows. Pt demonstrated safe eating behaviors and independently recalled/verbalized aspiration precautions recommended in acute care setting including small bites/sips, slow rate of consumption, minimize talking while eating, and take breaks as needed. Pt also aware that PMSV must be in place with all PO consumption. SLP discussed risk factors associated with aspiration including current presence of trach, prolonged intubation, and deconditioning. Pt verbalized understanding through teach back. SLP recommends a dysphagia 3 diet with thin liquids at this time, pills whole in puree or crushed as needed. Full supervision recommended to ensure implementation of swallowing compensatory strategies. Pt verbalized understanding and agreement with diet advancement and supervision status.  Cognition and language: Per informal evaluation, pt's cognitive-linguistic and expressive/receptive language skills appear WFL. Pt was oriented x4, demonstrated awareness of current deficits and their impact on current functioning, recalled events during hospitalization without hesitation, and displayed effective problem solving skills to various scenarios within current environment. Pt reported she feels she is at her cognitive baseline. May administer further testing as clinically indicated.   Skilled ST intervention is recommended to address PMSV care goals, vocal intensity strategies, and PO tolerance/advancement as appropriate.    Skilled Therapeutic Interventions          SLP facilitated speech/language/cognitive-linguistic/swallowing/PMSV evaluation. See report for full details.    SLP Assessment  Patient will need skilled Speech Lanaguage Pathology Services during CIR admission    Recommendations  Patient may use Passy-Muir Speech Valve: During all waking hours (remove during sleep);During PO intake/meals PMSV  Supervision: Intermittent SLP Diet Recommendations: Dysphagia 3 (Mech soft);Thin Liquid Administration via: Cup;Straw Medication Administration: Whole meds with puree Supervision: Patient able to self feed;Full supervision/cueing for compensatory strategies Compensations: Slow rate;Minimize environmental distractions;Small sips/bites Postural Changes and/or Swallow Maneuvers: Seated upright 90 degrees;Upright 30-60 min after meal Oral Care Recommendations: Oral care BID Patient destination: Home Follow up Recommendations: Other (comment) (TBD) Equipment Recommended: To be determined    SLP Frequency 1 to 3 out of 7 days   SLP Duration  SLP Intensity  SLP Treatment/Interventions 9-12 days  Minumum of 1-2 x/day, 30 to 90 minutes  Dysphagia/aspiration precaution training;Patient/family education;Speech/Language facilitation    Pain Pain Assessment Pain Scale: 0-10 Pain Score: 0-No pain  Prior Functioning Cognitive/Linguistic Baseline: Within functional limits Type of Home: House  Lives With: Son;Daughter;Family Available Help at Discharge: Family Education: Currently in [place] program Vocation: Full time employment  SLP Evaluation Cognition Overall Cognitive Status: Within Functional Limits for tasks assessed Arousal/Alertness: Awake/alert Orientation Level: Oriented X4 Year: 2024 Month: February Day of Week: Correct Attention: Focused;Selective;Sustained;Alternating Focused Attention: Appears intact Sustained Attention: Appears intact Selective Attention: Appears intact Alternating Attention: Appears intact Awareness: Appears intact Problem Solving: Appears intact Safety/Judgment: Appears intact  Comprehension Auditory Comprehension Overall Auditory Comprehension: Appears within functional limits for tasks assessed Expression Expression Primary Mode of Expression: Verbal Verbal Expression Overall Verbal Expression: Appears within functional limits for tasks  assessed Oral Motor Oral Motor/Sensory Function Overall Oral Motor/Sensory Function: Within functional limits Motor Speech Overall Motor Speech: Impaired Respiration: Within functional limits Level of Impairment: Word Phonation: Hoarse;Low vocal intensity Resonance: Within functional limits Articulation: Within functional limitis Intelligibility: Intelligible Motor Planning: Witnin functional limits Motor Speech Errors: Not  applicable Effective Techniques: Increased vocal intensity  Care Tool Care Tool Cognition Ability to hear (with hearing aid or hearing appliances if normally used Ability to hear (with hearing aid or hearing appliances if normally used): 0. Adequate - no difficulty in normal conservation, social interaction, listening to TV   Expression of Ideas and Wants Expression of Ideas and Wants: 3. Some difficulty - exhibits some difficulty with expressing needs and ideas (e.g, some words or finishing thoughts) or speech is not clear   Understanding Verbal and Non-Verbal Content Understanding Verbal and Non-Verbal Content: 4. Understands (complex and basic) - clear comprehension without cues or repetitions  Memory/Recall Ability Memory/Recall Ability : Current season;That he or she is in a hospital/hospital unit   PMSV Assessment  PMSV Trial PMSV was placed for: duration of SLP evaluation (1100-1210) Able to redirect subglottic air through upper airway: Yes Able to Attain Phonation: Yes Voice Quality: Hoarse;Low vocal intensity Able to Expectorate Secretions: No attempts Level of Secretion Expectoration with PMSV: Not observed Breath Support for Phonation: Adequate Intelligibility: Intelligible Respirations During Trial: 19 SpO2 During Trial: 97 % Pulse During Trial: 100 Behavior: Alert;Controlled;Cooperative;Expresses self well;Good eye contact;Responsive to questions  Bedside Swallowing Assessment General Date of Onset: 06/06/22 Previous Swallow Assessment:  07/04/22 Diet Prior to this Study: Dysphagia 1 (pureed);Thin liquids (Level 0) Temperature Spikes Noted: No Respiratory Status: Trach Trach Size and Type: #6 History of Recent Intubation:  (see notes in chart) Behavior/Cognition: Alert;Cooperative;Pleasant mood Oral Cavity - Dentition: Adequate natural dentition Self-Feeding Abilities: Able to feed self Vision: Functional for self-feeding Patient Positioning: Upright in bed Baseline Vocal Quality: Low vocal intensity;Hoarse Volitional Cough: Strong Volitional Swallow: Able to elicit  Oral Care Assessment Oral Assessment  (WDL): Within Defined Limits Lips: Symmetrical Teeth: Intact Tongue: Pink;Moist Mucous Membrane(s): Moist;Pink Saliva: Moist, saliva free flowing Level of Consciousness: Alert Is patient on any of following O2 devices?: Tracheostomy with trach collar 24hrs/day Nutritional status: Dysphagia Oral Assessment Risk : High Risk Ice Chips Ice chips: Not tested Thin Liquid Thin Liquid: Within functional limits Presentation: Cup;Straw Nectar Thick Nectar Thick Liquid: Not tested Honey Thick Honey Thick Liquid: Not tested Puree Puree: Within functional limits Solid Solid: Within functional limits Presentation: Self Fed BSE Assessment Risk for Aspiration Impact on safety and function: Mild aspiration risk Other Related Risk Factors: Tracheostomy;Prolonged intubation;Deconditioning  Short Term Goals: Week 1: SLP Short Term Goal 1 (Week 1): Patient will donn/doff PMSV with independence across 2 consecutive sessions SLP Short Term Goal 2 (Week 1): Patient will implement vocal intensity/breath support techniques with sup A verbal cues SLP Short Term Goal 3 (Week 1): Patient will independently recall and verbalize vocal hygiene considerations following education SLP Short Term Goal 4 (Week 1): Patient will tolerate current diet with minimal overt or subtle s/sx of aspiration and with sup A verbal cues for  implementation of swallowing compensatory strategies  Refer to Care Plan for Long Term Goals  Recommendations for other services: None   Discharge Criteria: Patient will be discharged from SLP if patient refuses treatment 3 consecutive times without medical reason, if treatment goals not met, if there is a change in medical status, if patient makes no progress towards goals or if patient is discharged from hospital.  The above assessment, treatment plan, treatment alternatives and goals were discussed and mutually agreed upon: by patient  Patty Sermons 07/06/2022, 4:38 PM

## 2022-07-06 NOTE — Consult Note (Signed)
NAME:  Desiree Stone, MRN:  [id number], DOB:  Oct 14, 1975, LOS: 1 ADMISSION DATE:  07/05/2022, CONSULTATION DATE:  07/06/22 REFERRING MD:  Letta Pate, CHIEF COMPLAINT:  SOB   History of Present Illness:  47 year old woman who presented to [facility] 1/17 with multifactorial acute hypoxemic respiratory failure (COPD, new heart failure, ?asthma).  Unable to be extubated and some question of upper airway swelling so underwent trach by ENT on 06/19/22.  Now TC, in IP rehab so PCCM consulted to assist with trach wean.  Current prox XLT cuffless in place.  Using PMV easily.  Wants trach out.  Baseline seems like insidious onset CHF symptoms and inability to tolerate CPAP mask.  Unfortunately lost to f/u to review downloads.  Also needs to establish care with someone for her COPD/asthma.  MMRC   0 Dyspnea only with strenuous exercise  1 Dyspnea when hurrying or walking up a slight hill  2 Walks slower than people of the same age because of dyspnea or has to stop for breath when walking at own pace  3 Stops for breath after walking 100 yards (67 m) or after a few minutes  4 Too dyspneic to leave house or breathless when dressing     Pertinent  Medical History   Past Medical History:  Diagnosis Date   Asthma    Chronic back pain    Diabetes mellitus without complication (HCC)    Hypertension    Iron deficiency anemia    Leiomyoma    s/p uterine artery emoblization   Sleep apnea    Tobacco use disorder      Significant Hospital Events: Including procedures, antibiotic start and stop dates in addition to other pertinent events   06/07/22: Admit to ICU with worsening acute respiratory failure suspect secondary to acute new onset heart failure int he setting of suspected pulmonary hypertension & chronic COPD, OSA, asthma requiring emergent intubation and mechanical ventilatory support.  06/08/22- patient is s/p SBT with severe secretions, she did diurese >800cc today with lasix challenge, CXR  with progressive pleural effusions. She has family coming in today. She's uisng OG and weve tranistioned to prednisone taper from solumedrol. Adding aldactone per tube. Iron workup today.  06/09/22-on minimal vent support, will attempt SBT.  Tracheal aspirate growing gram-positive cocci, ceftriaxone added 1/20 extubated and failed, re-intubated 1/21 severe COPD, remains on vent 1/22 severe COPD, remains on vent 1/23 severe upper airway swelling, ENT consulted 1/24 severe resp failure 1/26: Remains on vent, hemodynamically stable, tentative plan for Trach next Monday 1/29. 1/27: Remains on vent, no acute changes or events, awaiting Trach on Monday. D/C Solumedrol  1/28: Remains on vent, no acute changes.  BP soft, Lasix decreased to 40 mg daily. Low grade temperature overnight (T max 100), Obtain CT Chest 1/28: CT Chest>>Bilateral lower lobe consolidation, and right middle lobe       subsegmental atelectasis. No evidence of central endobronchial obstruction.       no evidence of pleural effusion. 2/01: CT Abd>>Portion of the transverse colon overlies the distal stomach.       Consider visualization of the transverse colon during percutaneous       gastrostomy tube placement. Persistent consolidation in both lower lobes.       Chronic left adrenal nodule. Minimal change since 2011 and likely a benign lesion.       Gallbladder distention without inflammatory changes. 2/02: Tolerated trach collar throughout the day  2/02: Speech therapy consulted for PMV trial, however  pt did not tolerate PMV placement due to inability to redirect airflow superiorly for phonation and breathing.  Possibly will require trach downsize when deemed appropriate 2/04: No acute events overnight.  Currently tolerating trach collar  2/06: No acute vents, tolerating TC during the day and resting on vent at night. Speech therapy/PT/OT following. ENT consulted pt pending downsize of trach on       02/12  06/30/22- patient  improved slightly , plan for downsize trache collar.  She has defervesced post Cefepime course now planning for percutaneous PEG. We dcd non essential medications today. She's on trache collar off vent and she may qualify for CIR.  07/01/22- patient had NG clogged.  She will need that replaced to get nourishment.  She has not required NIV for last few days and is lucid awake with stable vital signs.  She is with overall poor prognosis due to COPD an severe moribid obesity with BMI >60 2/15 admit to [facility] IP rehab, [facility] PCCM consulted  Interim History / Subjective:  Consulted  Objective   Blood pressure 128/77, pulse 84, temperature 97.9 F (36.6 C), temperature source Oral, resp. rate 18, height 4' 11"$  (1.499 m), weight 126.8 kg, last menstrual period 05/30/2022, SpO2 94 %.    FiO2 (%):  [28 %-35 %] 35 %   Intake/Output Summary (Last 24 hours) at 07/06/2022 1345 Last data filed at 07/06/2022 1200 Gross per 24 hour  Intake 1013 ml  Output 300 ml  Net 713 ml   Filed Weights   07/05/22 1557 07/05/22 1558 07/06/22 0500  Weight: 131.1 kg 131.1 kg 126.8 kg    Examination: General: no distress HENT: malampatti 3 airway Lungs: clear, no wheezing Cardiovascular: regular, ext warm Abdomen: soft, +BS Extremities: no edema Neuro: moves ext to command Skin: shiley and PMV in place, loquacious  Resolved Hospital Problem list   N/A  Assessment & Plan:  Chronic hypoxemic and hypercarbic resp failure due to ?COPD, OHS/OSA, cor pulmonale, pneumonia- improving after abx, reconditioning, diuresis - Cork trach - See how she does with no CPAP (does not tolerate at home, needs refitting and potentially new sleep study) - If does not tolerate cork, will need OP f/u in our trach clinic - Continue nebs, diuresis PRN, progressive mobility - Will follow, hopefully can get trach out and arrange OP f/u in our [place] office for PFTs and to try to sort out better CPAP plan  Best Practice (right click and  "Reselect all SmartList Selections" daily)  Per primary  Labs   CBC: Recent Labs  Lab 06/30/22 0709 07/01/22 0427 07/02/22 0606 07/03/22 0354 07/06/22 0648  WBC 10.6* 9.5 11.1* 12.7* 8.6  NEUTROABS 7.4  --   --   --  5.8  HGB 11.2* 11.0* 11.1* 11.2* 11.2*  HCT 40.4 39.8 40.5 41.3 39.8  MCV 74.5* 74.8* 75.6* 76.6* 76.5*  PLT 380 364 359 [id number]    Basic Metabolic Panel: Recent Labs  Lab 06/30/22 0709 07/01/22 0427 07/02/22 0606 07/03/22 0354 07/06/22 0648  NA 137 136 138 139 134*  K 4.0 3.4* 3.7 3.4* 3.3*  CL 95* 93* 98 99 95*  CO2 33* 34* 31 30 28  $ GLUCOSE 155* 117* 151* 167* 117*  BUN 26* 23* 24* 24* 6  CREATININE 0.49 0.47 0.54 0.50 0.48  CALCIUM 9.1 9.2 9.0 9.1 9.1  MG 2.3  --   --   --   --   PHOS 5.4* 6.3* 5.4* 4.7*  --    GFR:  Estimated Creatinine Clearance: 104 mL/min (by C-G formula based on SCr of 0.48 mg/dL). Recent Labs  Lab 07/01/22 0427 07/02/22 0606 07/03/22 0354 07/06/22 0648  WBC 9.5 11.1* 12.7* 8.6    Liver Function Tests: Recent Labs  Lab 07/01/22 0427 07/02/22 0606 07/03/22 0354 07/06/22 0648  AST  --   --   --  15  ALT  --   --   --  24  ALKPHOS  --   --   --  74  BILITOT  --   --   --  0.3  PROT  --   --   --  6.9  ALBUMIN 2.9* 3.1* 3.1* 2.8*   No results for input(s): "LIPASE", "AMYLASE" in the last 168 hours. No results for input(s): "AMMONIA" in the last 168 hours.  ABG    Component Value Date/Time   PHART 7.42 06/07/2022 0828   PCO2ART 60 (H) 06/07/2022 0828   PO2ART 68 (L) 06/07/2022 0828   HCO3 38.9 (H) 06/07/2022 0828   O2SAT 95.7 06/07/2022 0828     Coagulation Profile: No results for input(s): "INR", "PROTIME" in the last 168 hours.  Cardiac Enzymes: No results for input(s): "CKTOTAL", "CKMB", "CKMBINDEX", "TROPONINI" in the last 168 hours.  HbA1C: Hgb A1c MFr Bld  Date/Time Value Ref Range Status  06/07/2022 04:54 AM 9.0 (H) 4.8 - 5.6 % Final    Comment:    (NOTE) Pre diabetes:           5.7%-6.4%  Diabetes:              >6.4%  Glycemic control for   <7.0% adults with diabetes     CBG: Recent Labs  Lab 07/05/22 1751 07/05/22 2043 07/05/22 2223 07/06/22 0630 07/06/22 1127  GLUCAP 190* 180* 139* 120* 208*    Review of Systems:    Positive Symptoms in bold:  Constitutional fevers, chills, weight loss, fatigue, anorexia, malaise  Eyes decreased vision, double vision, eye irritation  Ears, Nose, Mouth, Throat sore throat, trouble swallowing, sinus congestion  Cardiovascular chest pain, paroxysmal nocturnal dyspnea, lower ext edema, palpitations   Respiratory SOB, cough, DOE, hemoptysis, wheezing  Gastrointestinal nausea, vomiting, diarrhea  Genitourinary burning with urination, trouble urinating  Musculoskeletal joint aches, joint swelling, back pain  Integumentary  rashes, skin lesions  Neurological focal weakness, focal numbness, trouble speaking, headaches  Psychiatric depression, anxiety, confusion  Endocrine polyuria, polydipsia, cold intolerance, heat intolerance  Hematologic abnormal bruising, abnormal bleeding, unexplained nose bleeds  Allergic/Immunologic recurrent infections, hives, swollen lymph nodes     Past Medical History:  She,  has a past medical history of Asthma, Chronic back pain, Diabetes mellitus without complication ([place]), Hypertension, Iron deficiency anemia, Leiomyoma, Sleep apnea, and Tobacco use disorder.   Surgical History:   Past Surgical History:  Procedure Laterality Date   carpel tunnel Bilateral    COLONOSCOPY WITH PROPOFOL N/A 12/08/2014   Procedure: COLONOSCOPY WITH PROPOFOL;  Surgeon: Lollie Sails, MD;  Location: [facility] ENDOSCOPY;  Service: Endoscopy;  Laterality: N/A;   ESOPHAGOGASTRODUODENOSCOPY     TRACHEOSTOMY TUBE PLACEMENT N/A 06/19/2022   Procedure: TRACHEOSTOMY;  Surgeon: Beverly Gust, MD;  Location: [facility] ORS;  Service: ENT;  Laterality: N/A;     Social History:   reports that she quit smoking  about 5 years ago. Her smoking use included cigarettes. She has never used smokeless tobacco. She reports that she does not drink alcohol and does not use drugs.   Family History:  Her family history includes  Breast cancer (age of onset: 4) in her paternal aunt; Cancer in her paternal grandmother.   Allergies Allergies  Allergen Reactions   Abilify [Aripiprazole] Other (See Comments)    Syncope   Effexor [Venlafaxine] Other (See Comments)    Hair Loss   Wellbutrin [Bupropion] Other (See Comments)    Acne      Home Medications  Prior to Admission medications   Medication Sig Start Date End Date Taking? Authorizing Provider  albuterol (PROVENTIL HFA;VENTOLIN HFA) 108 (90 BASE) MCG/ACT inhaler Inhale into the lungs every 6 (six) hours as needed for wheezing or shortness of breath.    [provider]  atorvastatin (LIPITOR) 40 MG tablet Take 40 mg by mouth at bedtime. 05/06/20   [provider]  cetirizine (ZYRTEC) 10 MG tablet Take 10 mg by mouth daily.    [provider]  cyanocobalamin 1000 MCG tablet Take 1,000 mcg by mouth daily.    [provider]  enalapril (VASOTEC) 20 MG tablet Take 20 mg by mouth daily.    [provider]  famotidine (PEPCID) 20 MG tablet Take 20 mg by mouth 2 (two) times daily.    [provider]  fluticasone (FLOVENT HFA) 110 MCG/ACT inhaler Inhale 2 puffs into the lungs 2 (two) times daily.    [provider]  hydrochlorothiazide (HYDRODIURIL) 25 MG tablet Take 25 mg by mouth daily.    [provider]  hydrOXYzine (ATARAX) 25 MG tablet Take 25 mg by mouth 3 (three) times daily as needed.    [provider]  insulin glargine-yfgn (SEMGLEE) 100 UNIT/ML injection Inject 0.3 mLs (30 Units total) into the skin 2 (two) times daily. 07/05/22   Jennye Boroughs, MD  ipratropium (ATROVENT HFA) 17 MCG/ACT inhaler Inhale 2 puffs into the lungs every 4 (four) hours as needed for wheezing.     [provider]  liraglutide (VICTOZA) 18 MG/3ML SOPN Inject 2.4 mg into the skin daily.    [provider]  metFORMIN (GLUCOPHAGE-XR) 500 MG 24 hr tablet Take 1,000 mg by mouth 2 (two) times daily with a meal.    [provider]     Critical care time: N/A

## 2022-07-06 NOTE — Evaluation (Signed)
Physical Therapy Assessment and Plan  Patient Details  Name: Desiree Stone MRN: [id number] Date of Birth: [birth year]  PT Diagnosis: Abnormality of gait, Difficulty walking, Impaired sensation, and Muscle weakness Rehab Potential: Good ELOS: 10-12 days   Today's Date: 07/06/2022 PT Individual Time: 1300-1410 PT Individual Time Calculation (min): 70 min    Hospital Problem: Principal Problem:   Debility   Past Medical History:  Past Medical History:  Diagnosis Date   Asthma    Chronic back pain    Diabetes mellitus without complication ([place])    Hypertension    Iron deficiency anemia    Leiomyoma    s/p uterine artery emoblization   Sleep apnea    Tobacco use disorder    Past Surgical History:  Past Surgical History:  Procedure Laterality Date   carpel tunnel Bilateral    COLONOSCOPY WITH PROPOFOL N/A 12/08/2014   Procedure: COLONOSCOPY WITH PROPOFOL;  Surgeon: Lollie Sails, MD;  Location: [facility] ENDOSCOPY;  Service: Endoscopy;  Laterality: N/A;   ESOPHAGOGASTRODUODENOSCOPY     TRACHEOSTOMY TUBE PLACEMENT N/A 06/19/2022   Procedure: TRACHEOSTOMY;  Surgeon: Beverly Gust, MD;  Location: [facility] ORS;  Service: ENT;  Laterality: N/A;    Assessment & Plan Clinical Impression: Patient is a 47 y.o. year old female  with history of HTN, T2DM, super morbid obesity-BMI 22, who was admitted to [facility] on 06/06/22 with 3 week history of SOB, productive cough with thick sputum and palpitations. She she reported SOB/DOE for several months, orthopnea and sleeping in recliner for weeks as well as LE edema.   Work up negative for viral illness.  2 D echo showed EF 60-65% with biatrial dilatation.  CTA chest showed cardiomegaly with enlarged main PA s/o PAH and refluxed contrast into IVC suggesting right heart dysfunction.  Her respiratory status declined due to persistent acidosis despite use of BIPAP, IV diureses and required intubation by 06/07/22. Dr. Lanney Gins felt that acute  respiratory failure was in setting of hypercapnia with hypoxemia and possible CHF on arrival in setting of severe morbid obesity, suspected COPD (reported ongoing tobacco use--2-3 PPD) and deconditioning with likely sleep apnea.  She was not able to tolerate home CPAP previoulsy.  She was treated with IV diuresis and IV Solu-Medrol for COPD exacerbation w/progressive pleural effusion. Severe tracheal  secretions positive for gram positive cocci and she was treated with ceftriaxone for CAP. [name]  Patient transferred to CIR on 07/05/2022 .   Patient currently requires min with mobility secondary to muscle weakness, decreased cardiorespiratoy endurance and decreased oxygen support, and decreased sitting balance, decreased standing balance, decreased postural control, and decreased balance strategies.  Prior to hospitalization, patient was independent  with mobility and lived with Son, Daughter, Family in a House home.  Home access is 3Stairs to enter.  Patient will benefit from skilled PT intervention to maximize safe functional mobility, minimize fall risk, and decrease caregiver burden for planned discharge home with intermittent assist.  Anticipate patient will benefit from follow up [facility] at discharge.  PT - End of Session Activity Tolerance: Tolerates 10 - 20 min activity with multiple rests Endurance Deficit: Yes Endurance Deficit Description: Dyspnea PT Assessment Rehab Potential (ACUTE/IP ONLY): Good PT Barriers to Discharge: Inaccessible home environment;Decreased caregiver support;Home environment access/layout;Weight;New oxygen PT Barriers to Discharge Comments: STE Home, endurance, SpO2 PT Patient demonstrates impairments in the following area(s): Balance;Sensory;Endurance;Motor PT Transfers Functional Problem(s): Car PT Locomotion Functional Problem(s): Ambulation;Wheelchair Mobility;Stairs PT Plan PT Intensity: Minimum of 1-2 x/day ,45 to 90 minutes  PT Frequency: 5 out of 7 days PT Duration  Estimated Length of Stay: 10-12 days PT Treatment/Interventions: Financial risk analyst;Ambulation/gait training;Neuromuscular re-education;Psychosocial support;Stair training;UE/LE Strength taining/ROM;Wheelchair propulsion/positioning;Balance/vestibular training;Discharge planning;Functional electrical stimulation;Pain management;Therapeutic Activities;UE/LE Coordination activities;Disease management/prevention;Functional mobility training;Patient/family education;Splinting/orthotics;Therapeutic Exercise;Visual/perceptual remediation/compensation PT Transfers Anticipated Outcome(s): Mod I w/ transfers PT Locomotion Anticipated Outcome(s): Modified I w/ AD for short distances PT Recommendation Recommendations for Other Services: Therapeutic Recreation consult Therapeutic Recreation Interventions: Pet therapy;Kitchen group;Stress management;Outing/community reintergration Follow Up Recommendations: Home health PT Patient destination: Home Equipment Recommended: To be determined   PT Evaluation Precautions/Restrictions Precautions Precautions: Fall Precaution Comments: watch O2 keep >90% Restrictions Weight Bearing Restrictions: No General   Vital SignsOxygen Therapy SpO2: 99 % O2 Device: (S) Nasal Cannula O2 Flow Rate (L/min): 4 L/min Pain Pain Assessment Pain Scale: 0-10 Pain Score: 0-No pain Pain Interference Pain Interference Pain Effect on Sleep: 2. Occasionally Pain Interference with Therapy Activities: 1. Rarely or not at all Pain Interference with Day-to-Day Activities: 1. Rarely or not at all Home Living/Prior [place] Living Arrangements: Alone Available Help at Discharge: Family Type of Home: House Home Access: Stairs to enter Technical brewer of Steps: 3 Entrance Stairs-Rails: Can reach both Home Layout: One level Bathroom Shower/Tub: Multimedia programmer: Standard Bathroom Accessibility: No  (Inaccessible to [place] and Walker)  Lives With: Son;Daughter;Family Prior Function Level of Independence: Independent with basic ADLs;Independent with homemaking with ambulation;Independent with gait;Independent with transfers  Able to Take Stairs?: Yes Driving: Yes Vocation: Full time employment Vision/Perception  Vision - History Ability to See in Adequate Light: 0 Adequate  Cognition Overall Cognitive Status: Within Functional Limits for tasks assessed Arousal/Alertness: Awake/alert Orientation Level: Oriented X4 Year: 2024 Month: February Day of Week: Correct Attention: Focused;Selective;Sustained;Alternating Focused Attention: Appears intact Sustained Attention: Appears intact Selective Attention: Appears intact Alternating Attention: Appears intact Awareness: Appears intact Problem Solving: Appears intact Safety/Judgment: Appears intact Sensation Sensation Light Touch: Impaired Detail Peripheral sensation comments: L foot numbness w/ impaired sensation Proprioception: Impaired by gross assessment Coordination Gross Motor Movements are Fluid and Coordinated: No Coordination and Movement Description: Antalgic gait, decreased stance time on RLE Motor  Motor Motor: Other (comment) Motor - Skilled Clinical Observations: Antalgic gait   Trunk/Postural Assessment  Cervical Assessment Cervical Assessment: Within Functional Limits Thoracic Assessment Thoracic Assessment: Within Functional Limits Lumbar Assessment Lumbar Assessment: Within Functional Limits Postural Control Postural Control: Deficits on evaluation  Balance Balance Balance Assessed: Yes Standardized Balance Assessment Standardized Balance Assessment: Timed Up and Go Test Timed Up and Go Test TUG: Normal TUG Normal TUG (seconds): 32 Static Sitting Balance Static Sitting - Balance Support: Feet supported Static Sitting - Level of Assistance: 6: Modified independent (Device/Increase time) Dynamic  Sitting Balance Dynamic Sitting - Balance Support: During functional activity;Feet unsupported Dynamic Sitting - Level of Assistance: 5: Stand by assistance Static Standing Balance Static Standing - Balance Support: Bilateral upper extremity supported;During functional activity Static Standing - Level of Assistance: 5: Stand by assistance Dynamic Standing Balance Dynamic Standing - Balance Support: During functional activity Dynamic Standing - Level of Assistance: 4: Min assist Extremity Assessment      RLE Assessment RLE Assessment: Exceptions to [facility] RLE Strength RLE Overall Strength: Deficits Right Hip Flexion: 3/5 Right Hip ABduction: 4-/5 Right Hip ADduction: 4/5 Right Knee Flexion: 3+/5 Right Knee Extension: 4/5 Right Ankle Dorsiflexion: 3+/5 LLE Assessment LLE Assessment: Exceptions to [facility] LLE Strength LLE Overall Strength: Deficits Left Hip Flexion: 3-/5 Left Hip ABduction: 4-/5 Left Hip ADduction: 4/5 Left Knee Flexion:  3/5 Left Knee Extension: 4-/5 Left Ankle Dorsiflexion: 3/5  Care Tool Care Tool Bed Mobility Roll left and right activity   Roll left and right assist level: Minimal Assistance - Patient > 75%    Sit to lying activity   Sit to lying assist level: Minimal Assistance - Patient > 75%    Lying to sitting on side of bed activity   Lying to sitting on side of bed assist level: the ability to move from lying on the back to sitting on the side of the bed with no back support.: Minimal Assistance - Patient > 75%     Care Tool Transfers Sit to stand transfer   Sit to stand assist level: Contact Guard/Touching assist    Chair/bed transfer   Chair/bed transfer assist level: Minimal Assistance - Patient > 75%     Toilet transfer   Assist Level: Minimal Assistance - Patient > 75%    Car transfer   Car transfer assist level: Minimal Assistance - Patient > 75%      Care Tool Locomotion Ambulation   Assist level: Contact Guard/Touching  assist Assistive device: Walker-rolling Max distance: 57f  Walk 10 feet activity   Assist level: Minimal Assistance - Patient > 75% Assistive device: Walker-rolling   Walk 50 feet with 2 turns activity Walk 50 feet with 2 turns activity did not occur: Safety/medical concerns      Walk 150 feet activity Walk 150 feet activity did not occur: Safety/medical concerns      Walk 10 feet on uneven surfaces activity   Assist level: Contact Guard/Touching assist Assistive device:  (Arts development officer  Stairs   Assist level: Minimal Assistance - Patient > 75% Stairs assistive device: 2 hand rails Max number of stairs: 4  Walk up/down 1 step activity   Walk up/down 1 step (curb) assist level: Minimal Assistance - Patient > 75% Walk up/down 1 step or curb assistive device: 2 hand rails  Walk up/down 4 steps activity   Walk up/down 4 steps assist level: Minimal Assistance - Patient > 75% Walk up/down 4 steps assistive device: 2 hand rails  Walk up/down 12 steps activity Walk up/down 12 steps activity did not occur: Safety/medical concerns      Pick up small objects from floor   Pick up small object from the floor assist level: Independent with assistive device Pick up small object from the floor assistive device: Reacher  Wheelchair Is the patient using a wheelchair?: Yes Type of Wheelchair: Manual   Wheelchair assist level: Contact Guard/Touching assist Max wheelchair distance: 513f Wheel 50 feet with 2 turns activity   Assist Level: Minimal Assistance - Patient > 75%  Wheel 150 feet activity Wheelchair 150 feet activity did not occur: Safety/medical concerns      Refer to Care Plan for Long Term Goals  SHORT TERM GOAL WEEK 1 PT Short Term Goal 1 (Week 1): Pt w// ambulate 100 ft w/ CGA PT Short Term Goal 2 (Week 1): Pt will decrease TUG by > 5 seconds PT Short Term Goal 3 (Week 1): Pt will perform transfers Mod I  Recommendations for other services: Therapeutic Recreation  Pet  therapy, Kitchen group, Stress management, and Outing/community reintegration  Skilled Therapeutic Intervention Mobility Bed Mobility Bed Mobility: Sitting - Scoot to [employer]arshall [employer] Ils[employer] Bed Sitting - Scoot to [employer] Bed: Independent with assistive device Transfers Transfers: Sit to Stand;Stand to SiLockheed Martinransfers Sit to Stand: Contact Guard/Touching assist Stand to Sit: Contact Guard/Touching assist Stand  Pivot Transfers: Minimal Assistance - Patient > 75% Stand Pivot Transfer Details: Verbal cues for precautions/safety;Verbal cues for technique Transfer (Assistive device): Rolling walker Locomotion  Gait Ambulation: Yes Gait Assistance: Minimal Assistance - Patient > 75%;Contact Guard/Touching assist Gait Distance (Feet): 40 Feet Assistive device: Rolling walker Gait Assistance Details: Manual facilitation for weight shifting;Verbal cues for gait pattern;Verbal cues for safe use of DME/AE;Verbal cues for precautions/safety Gait Gait: Yes Gait Pattern: Impaired Gait Pattern: Decreased step length - left;Decreased step length - right;Decreased stance time - left;Antalgic;Wide base of support Gait velocity: decreased Stairs / Additional Locomotion Stairs: Yes Stairs Assistance: Minimal Assistance - Patient > 75% Stair Management Technique: Two rails Number of Stairs: 4 Height of Stairs: 6 Ramp: Contact Guard/touching assist Curb: Minimal Assistance - Patient >75% Wheelchair Mobility Wheelchair Mobility: Yes Wheelchair Assistance: Development worker, international aid: Both upper extremities Distance: 80f  Pt received sitting at EOB w/ Respiratory therapist condition assessment. Pt taken on trach support. Pt using 4L SpO2 via nasal cannula. Pt agreeable to therapy. Evaluation assessment conducted as noted above. Pt performed stand pivot transfers throughout session via RW w/ CGA or w/o RW w/ Min A. Pt performed sit to stand transfer w/ CGA throughout session. Car  transfer was Min A for LLE management. Pt expresses numbness in LLE and attributes that to laying down on her back for an extended period of time during hospital stay. Sensation testing showed impaired sensation through L foot. TUG assessment done using RW. Pt performed task in 32 seconds and was made aware of score indicating increased fall risk. Pt performed gait training 1x40 ft 1x 30 ft w/ CGA. SpO2 checked after first gait trail read 99%. Therapist observed antalgic gait w/ decreased stance time on LLE. Decreased wt shift to the L. Pt negotiated 4 stairs using BHR. Pt educated on sequencing of stairs. Instructed to ascend leading w/ RLE and descend leading w/ LLE. Therapist providing Min A for control of movement. Pt expressed the desire to use ramp w/o AD. Therapist there to provide Min A for gait and pt cued to slow movement down especially on descent. Pt performed WC mobility for ~50 ft. Min A for steering purposes. Pt took 2 rest breaks in between due to fatigue. Pt transported back to room dependently and performed bed mobility Mod I to scoot to EOB. Pt left sitting at EOB w/ call bell in reach and all needs met.   Discharge Criteria: Patient will be discharged from PT if patient refuses treatment 3 consecutive times without medical reason, if treatment goals not met, if there is a change in medical status, if patient makes no progress towards goals or if patient is discharged from hospital.  The above assessment, treatment plan, treatment alternatives and goals were discussed and mutually agreed upon: by patient  TStarleen Arms2/15/2024, 4:14 PM

## 2022-07-06 NOTE — Plan of Care (Cosign Needed)
Problem: RH Balance Goal: LTG Patient will maintain dynamic sitting balance (PT) Description: LTG:  Patient will maintain dynamic sitting balance with assistance during mobility activities (PT) Flowsheets (Taken 07/06/2022 1608) LTG: Pt will maintain dynamic sitting balance during mobility activities with:: Independent with assistive device  Goal: LTG Patient will maintain dynamic standing balance (PT) Description: LTG:  Patient will maintain dynamic standing balance with assistance during mobility activities (PT) Flowsheets (Taken 07/06/2022 1608) LTG: Pt will maintain dynamic standing balance during mobility activities with:: Supervision/Verbal cueing   Problem: Sit to Stand Goal: LTG:  Patient will perform sit to stand with assistance level (PT) Description: LTG:  Patient will perform sit to stand with assistance level (PT) Flowsheets (Taken 07/06/2022 1608) LTG: PT will perform sit to stand in preparation for functional mobility with assistance level: Independent with assistive device   Problem: RH Bed Mobility Goal: LTG Patient will perform bed mobility with assist (PT) Description: LTG: Patient will perform bed mobility with assistance, with/without cues (PT). Flowsheets (Taken 07/06/2022 1608) LTG: Pt will perform bed mobility with assistance level of: Independent with assistive device    Problem: RH Bed to Chair Transfers Goal: LTG Patient will perform bed/chair transfers w/assist (PT) Description: LTG: Patient will perform bed to chair transfers with assistance (PT). Flowsheets (Taken 07/06/2022 1608) LTG: Pt will perform Bed to Chair Transfers with assistance level: Independent with assistive device    Problem: RH Car Transfers Goal: LTG Patient will perform car transfers with assist (PT) Description: LTG: Patient will perform car transfers with assistance (PT). Flowsheets (Taken 07/06/2022 1608) LTG: Pt will perform car transfers with assist:: Set up assist    Problem: RH  Wheelchair Mobility Goal: LTG Patient will propel w/c in controlled environment (PT) Description: LTG: Patient will propel wheelchair in controlled environment, # of feet with assist (PT) Flowsheets (Taken 07/06/2022 1608) LTG: Pt will propel w/c in controlled environ  assist needed:: Independent with assistive device LTG: Propel w/c distance in controlled environment: 32f Goal: LTG Patient will propel w/c in community environment (PT) Description: LTG: Patient will propel wheelchair in community environment, # of feet with assist (PT) Flowsheets (Taken 07/06/2022 1611) LTG: Pt will propel w/c in community environ  assist needed:: Independent with assistive device Distance: wheelchair distance in controlled environment: 25   Problem: RH Stairs Goal: LTG Patient will ambulate up and down stairs w/assist (PT) Description: LTG: Patient will ambulate up and down # of stairs with assistance (PT) 07/06/2022 1613 by BStarleen Arms Student-PT Flowsheets (Taken 07/06/2022 1613) LTG: Pt will  ambulate up and down number of stairs: 8 07/06/2022 1612 by BSudie Grumbling Kaylon Hitz, Student-PT Flowsheets (Taken 07/06/2022 1612) LTG: Pt will  ambulate up and down number of stairs: 3 07/06/2022 1611 by BSudie Grumbling Izaya Netherton, Student-PT Flowsheets (Taken 07/06/2022 1611) LTG: Pt will ambulate up/down stairs assist needed:: Supervision/Verbal cueing LTG: Pt will  ambulate up and down number of stairs: 12   Problem: RH Ambulation Goal: LTG Patient will ambulate in controlled environment (PT) Description: LTG: Patient will ambulate in a controlled environment, # of feet with assistance (PT). Flowsheets (Taken 07/06/2022 1911) LTG: Pt will ambulate in controlled environ  assist needed:: Independent with assistive device LTG: Ambulation distance in controlled environment: 1062fGoal: LTG Patient will ambulate in home environment (PT) Description: LTG: Patient will ambulate in home environment, # of feet with assistance  (PT). Flowsheets (Taken 07/06/2022 1911) LTG: Pt will ambulate in home environ  assist needed:: Independent with assistive device LTG: Ambulation distance in home environment: 5084f  Goal: LTG Patient will ambulate in community environment (PT) Description: LTG: Patient will ambulate in community environment, # of feet with assistance (PT). Flowsheets (Taken 07/06/2022 1911) LTG: Pt will ambulate in community environ  assist needed:: Set up assist

## 2022-07-06 NOTE — Progress Notes (Signed)
Pt's trach was capped and pt was placed on a 4L [place]. Pt is tolerating well and maintaining an O2 saturation of 97-100% at this time.

## 2022-07-06 NOTE — Progress Notes (Signed)
Patient ID: Desiree Stone, female   DOB: 02-Aug-1975, 47 y.o.   MRN: [id number] Met with the patient to review current situation, rehab process, team conference and plan of care. Discussed respiratory issues since August 2023, doesn't remember half of her hospital stay. Feels like she is doing better and would like to go home trach free and not have to use AE. Reviewed medications, dietary modification recommendations and insulin administration using a pen. Continue to follow along to address educational needs to facilitate preparation for discharge. Margarito Liner

## 2022-07-06 NOTE — Progress Notes (Signed)
Initial Nutrition Assessment  DOCUMENTATION CODES:   Morbid obesity  INTERVENTION:  - Add MVI q day.   NUTRITION DIAGNOSIS:   Swallowing difficulty related to dysphagia as evidenced by other (comment) (altered diet (Dys 1)).  GOAL:   Patient will meet greater than or equal to 90% of their needs  MONITOR:   PO intake  REASON FOR ASSESSMENT:   Consult (consult for EN but now canceled)    ASSESSMENT:   47 y.o. female admits to CIR related to functional deficits. Pt with trach collar and tolerating PSMV. PMH includes: HTN, T2DM, and morbid obesity. Pt with recent admission at [facility] related to COPD exacerbation, CHF and respiratory failure.  Meds reviewed:  lipitor, pepcid, lasix, sliding scale insulin, semglee (30 units). Labs reviewed: Na low, K low, Chloride low. FS BG 107-221 mg/dL (blood sugars fairly controlled). HgA1c 9%.  Originally, MD consult for EN. However, consult cancelled. Pt with trach collar but no EN access. However diet got advanced to Dys 1 yesterday on 2/14. The pt has been tolerating diet well so far per record. RD will add MVI and continue to monitor PO intakes.   NUTRITION - FOCUSED PHYSICAL EXAM:  Unable to assess due to remote assessment.   Diet Order:   Diet Order             DIET - DYS 1 Room service appropriate? Yes with Assist; Fluid consistency: Thin  Diet effective now                   EDUCATION NEEDS:   Not appropriate for education at this time  Skin:  Skin Assessment: Skin Integrity Issues: Skin Integrity Issues:: Incisions Incisions: neck; s/p trach placement  Last BM:  2/14  Height:   Ht Readings from Last 1 Encounters:  07/05/22 4' 11"$  (1.499 m)    Weight:   Wt Readings from Last 1 Encounters:  07/06/22 126.8 kg    Ideal Body Weight:     BMI:  Body mass index is 56.46 kg/m.  Estimated Nutritional Needs:   Kcal:  [id number] kcals  Protein:  100-120 gm  Fluid:  >/= 1.9 L  Thalia Bloodgood, RD, LDN, CNSC.

## 2022-07-06 NOTE — Progress Notes (Signed)
PROGRESS NOTE   Subjective/Complaints:  No issues overnite , breathing via trach with PMV  ROS- neg CP, SOB, + cough, neg abd pain  Objective:   No results found. Recent Labs    07/06/22 0648  WBC 8.6  HGB 11.2*  HCT 39.8  PLT 255   Recent Labs    07/06/22 0648  NA 134*  K 3.3*  CL 95*  CO2 28  GLUCOSE 117*  BUN 6  CREATININE 0.48  CALCIUM 9.1    Intake/Output Summary (Last 24 hours) at 07/06/2022 [id number] Last data filed at 07/05/2022 2330 Gross per 24 hour  Intake 480 ml  Output 300 ml  Net 180 ml        Physical Exam: Vital Signs Blood pressure (!) 150/76, pulse (!) 110, temperature 98.2 F (36.8 C), temperature source Oral, resp. rate 18, height 4' 11"$  (1.499 m), weight 126.8 kg, last menstrual period 05/30/2022, SpO2 98 %.  ENT- #6 Shiley trach with PMV, no drainage  General: No acute distress Mood and affect are appropriate Heart: Regular rate and rhythm no rubs murmurs or extra sounds Lungs: Clear to auscultation, breathing unlabored, no rales or wheezes Abdomen: Positive bowel sounds, soft nontender to palpation, nondistended Extremities: No clubbing, cyanosis, or edema Skin: No evidence of breakdown, no evidence of rash Neurologic: Cranial nerves II through XII intact, motor strength is 5/5 in bilateral deltoid, bicep, tricep, grip, hip flexor, knee extensors, ankle dorsiflexor and plantar flexor Sensory exam normal sensation to light touch and proprioception in bilateral upper and lower extremities Cerebellar exam normal finger to nose to finger as well as heel to shin in bilateral upper and lower extremities Musculoskeletal: Full range of motion in all 4 extremities. No joint swelling   Assessment/Plan: 1. Functional deficits which require 3+ hours per day of interdisciplinary therapy in a comprehensive inpatient rehab setting. Physiatrist is providing close team supervision and 24 hour  management of active medical problems listed below. Physiatrist and rehab team continue to assess barriers to discharge/monitor patient progress toward functional and medical goals  Care Tool:  Bathing              Bathing assist       Upper Body Dressing/Undressing Upper body dressing        Upper body assist      Lower Body Dressing/Undressing Lower body dressing            Lower body assist       Toileting Toileting    Toileting assist       Transfers Chair/bed transfer  Transfers assist           Locomotion Ambulation   Ambulation assist              Walk 10 feet activity   Assist           Walk 50 feet activity   Assist           Walk 150 feet activity   Assist           Walk 10 feet on uneven surface  activity   Assist  Wheelchair     Assist               Wheelchair 50 feet with 2 turns activity    Assist            Wheelchair 150 feet activity     Assist          Blood pressure (!) 150/76, pulse (!) 110, temperature 98.2 F (36.8 C), temperature source Oral, resp. rate 18, height 4' 11"$  (1.499 m), weight 126.8 kg, last menstrual period 05/30/2022, SpO2 98 %.  Medical Problem List and Plan: 1. Functional deficits secondary to Debility due to respiratory failure, CHF, COPD , Pneumonia              -patient may  shower             -ELOS/Goals: 8-10 days, PT and OT sup to mod I, SLP mod I             -Admit to CIR, PT/OT/SLP 2.  Antithrombotics: -DVT/anticoagulation:  Pharmaceutical: Lovenox             -antiplatelet therapy: N/A 3. Pain Management: Oxycodone 29m prn.  4. Mood/Behavior/Sleep: LCSW to follow for evaluation and support. Zoloft 552mdaily             -antipsychotic agents: N/A             --Melatonin prn insomnia. Prn Trazodone 5. Neuropsych/cognition: This patient is capable of making decisions on his own behalf. 6. Skin/Wound Care: Routine  pressure relief measures.              --add juven for nutritional support.  7. Fluids/Electrolytes/Nutrition:  Monitor I/O. Check CMET in am 8.Hypercarbic respiratory failure s/p trach:             -Supplemental O2 to maintain O2 sats above 88% 9. COPD: Budesonide nebs d/c 02/09 and on duo nebs BID now. 10. Congestive heart failure: Strict I/O--lasix decreased to 40 mg/daily on 02/13. Daily weight.             --weight at admission 300 lbs-->discharge wt 284 lbs             --Monitor  for signs of overload. 11. T2DM: hgb A1c-9.0 and poorly controlled.              --continue Insulin Glargline 30 units BID w/meal coverage, 7 units novolog mealtime  CBG (last 3)  Recent Labs    07/05/22 2043 07/05/22 2223 07/06/22 0630  GLUCAP 180* 139* 120*   Controlled 2/15  12. Hypokalemia: Has been supplemented intermittently --recheck K+ in am.  13. Leucocytosis: Improved but persistent and trending back up. WBC 12.7 07/03/22             --monitor for fevers and other signs of infection.  14. Iron deficiency anemia: Recheck CBC in am. HGB 11.2 2/12 15.  Depression: Zoloft daily 16. Dysphagia, Dys 1/thin diet             -Continue SLP    LOS: 1 days A FACE TO FACE EVALUATION WAS PERFORMED  AnCharlett Blake/15/2024, 9:09 AM

## 2022-07-07 LAB — GLUCOSE, CAPILLARY
Glucose-Capillary: 130 mg/dL — ABNORMAL HIGH (ref 70–99)
Glucose-Capillary: 135 mg/dL — ABNORMAL HIGH (ref 70–99)
Glucose-Capillary: 146 mg/dL — ABNORMAL HIGH (ref 70–99)
Glucose-Capillary: 125 mg/dL — ABNORMAL HIGH (ref 70–99)
Glucose-Capillary: 138 mg/dL — ABNORMAL HIGH (ref 70–99)

## 2022-07-07 MED ORDER — HYDROXYZINE HCL 10 MG PO TABS
10.0000 mg | ORAL_TABLET | Freq: Three times a day (TID) | ORAL | Status: DC | PRN
  Administered 2022-07-07 – 2022-07-11 (×4): 10 mg via ORAL
  Filled 2022-07-07 (×7): qty 1

## 2022-07-07 MED ORDER — POTASSIUM CHLORIDE CRYS ER 10 MEQ PO TBCR
10.0000 meq | EXTENDED_RELEASE_TABLET | Freq: Two times a day (BID) | ORAL | Status: AC
  Administered 2022-07-07 – 2022-07-09 (×6): 10 meq via ORAL
  Filled 2022-07-07 (×5): qty 1

## 2022-07-07 NOTE — Progress Notes (Signed)
PROGRESS NOTE   Subjective/Complaints:  Capping trial rec by CCM/pulmonary med, tol capping x 24h without desat, RRT in to remove trach   ROS- neg CP, SOB, + cough, neg abd pain  Objective:   No results found. Recent Labs    07/06/22 0648  WBC 8.6  HGB 11.2*  HCT 39.8  PLT 255    Recent Labs    07/06/22 0648  NA 134*  K 3.3*  CL 95*  CO2 28  GLUCOSE 117*  BUN 6  CREATININE 0.48  CALCIUM 9.1     Intake/Output Summary (Last 24 hours) at 07/07/2022 0849 Last data filed at 07/06/2022 2055 Gross per 24 hour  Intake 727 ml  Output --  Net 727 ml         Physical Exam: Vital Signs Blood pressure 131/69, pulse (!) 111, temperature 98.4 F (36.9 C), temperature source Oral, resp. rate 18, height 4' 11"$  (1.499 m), weight 129.1 kg, last menstrual period 05/30/2022, SpO2 95 %.  ENT- #6 Shiley trach with red cap, no drainage  General: No acute distress Mood and affect are appropriate Heart: Regular rate and rhythm no rubs murmurs or extra sounds Lungs: Clear to auscultation, breathing unlabored, no rales or wheezes Abdomen: Positive bowel sounds, soft nontender to palpation, nondistended Extremities: No clubbing, cyanosis, or edema Skin: No evidence of breakdown, no evidence of rash Neurologic: Cranial nerves II through XII intact, motor strength is 5/5 in bilateral deltoid, bicep, tricep, grip, hip flexor, knee extensors, ankle dorsiflexor and plantar flexor Sensory exam normal sensation to light touch and proprioception in bilateral upper and lower extremities Cerebellar exam normal finger to nose to finger as well as heel to shin in bilateral upper and lower extremities Musculoskeletal: Full range of motion in all 4 extremities. No joint swelling   Assessment/Plan: 1. Functional deficits which require 3+ hours per day of interdisciplinary therapy in a comprehensive inpatient rehab setting. Physiatrist is  providing close team supervision and 24 hour management of active medical problems listed below. Physiatrist and rehab team continue to assess barriers to discharge/monitor patient progress toward functional and medical goals  Care Tool:  Bathing    Body parts bathed by patient: Right arm, Left arm, Chest, Abdomen, Front perineal area, Right upper leg, Left upper leg, Face   Body parts bathed by helper: Buttocks, Right lower leg, Left lower leg     Bathing assist Assist Level: Moderate Assistance - Patient 50 - 74%     Upper Body Dressing/Undressing Upper body dressing   What is the patient wearing?: Pull over shirt    Upper body assist Assist Level: Supervision/Verbal cueing    Lower Body Dressing/Undressing Lower body dressing      What is the patient wearing?: Pants     Lower body assist Assist for lower body dressing: Maximal Assistance - Patient 25 - 49%     Toileting Toileting    Toileting assist Assist for toileting: Maximal Assistance - Patient 25 - 49%     Transfers Chair/bed transfer  Transfers assist     Chair/bed transfer assist level: Minimal Assistance - Patient > 75%     Locomotion Ambulation   Ambulation  assist      Assist level: Contact Guard/Touching assist Assistive device: Walker-rolling Max distance: 27f   Walk 10 feet activity   Assist     Assist level: Minimal Assistance - Patient > 75% Assistive device: Walker-rolling   Walk 50 feet activity   Assist Walk 50 feet with 2 turns activity did not occur: Safety/medical concerns         Walk 150 feet activity   Assist Walk 150 feet activity did not occur: Safety/medical concerns         Walk 10 feet on uneven surface  activity   Assist     Assist level: Contact Guard/Touching assist Assistive device:  (Arts development officer   Wheelchair     Assist Is the patient using a wheelchair?: Yes Type of Wheelchair: Manual    Wheelchair assist level: Contact  Guard/Touching assist Max wheelchair distance: 53f   Wheelchair 50 feet with 2 turns activity    Assist        Assist Level: Minimal Assistance - Patient > 75%   Wheelchair 150 feet activity     Assist      Assist Level: Maximal Assistance - Patient 25 - 49%   Blood pressure 131/69, pulse (!) 111, temperature 98.4 F (36.9 C), temperature source Oral, resp. rate 18, height 4' 11"$  (1.499 m), weight 129.1 kg, last menstrual period 05/30/2022, SpO2 95 %.  Medical Problem List and Plan: 1. Functional deficits secondary to Debility due to respiratory failure, CHF, COPD , Pneumonia              -patient may  shower             -ELOS/Goals: 8-10 days, PT and OT sup to mod I, SLP mod I             -Admit to CIR, PT/OT/SLP 2.  Antithrombotics: -DVT/anticoagulation:  Pharmaceutical: Lovenox             -antiplatelet therapy: N/A 3. Pain Management: Oxycodone 1042mrn.  4. Mood/Behavior/Sleep: LCSW to follow for evaluation and support. Zoloft 75m78mily             -antipsychotic agents: N/A             --Melatonin prn insomnia. Prn Trazodone 5. Neuropsych/cognition: This patient is capable of making decisions on his own behalf. 6. Skin/Wound Care: Routine pressure relief measures.              --add juven for nutritional support.  7. Fluids/Electrolytes/Nutrition:  Monitor I/O. Check CMET in am 8.Hypercarbic respiratory failure s/p trach:             -Supplemental O2 to maintain O2 sats above 88% 9. COPD: Budesonide nebs d/c 02/09 and on duo nebs BID now. 10. Congestive heart failure: Strict I/O--lasix decreased to 40 mg/daily on 02/13. Daily weight.             --weight at admission 300 lbs-->discharge wt 284 lbs             --Monitor  for signs of overload. 11. T2DM: hgb A1c-9.0 and poorly controlled.              --continue Insulin Glargline 30 units BID w/meal coverage, 7 units novolog mealtime  CBG (last 3)  Recent Labs    07/06/22 2046 07/07/22 0604  07/07/22 0631  GLUCAP 131* 130* 135*    Controlled 2/16  12. Hypokalemia: Has been supplemented intermittently --recheck K+ in am.  13.  Leucocytosis: Improved but persistent and trending back up. WBC 12.7 07/03/22             --monitor for fevers and other signs of infection.  14. Iron deficiency anemia: Recheck CBC in am. HGB 11.2 2/12 15.  Depression: Zoloft daily 16. Dysphagia, Dys 1/thin diet             -Continue SLP    LOS: 2 days A FACE TO FACE EVALUATION WAS PERFORMED  Charlett Blake 07/07/2022, 8:49 AM

## 2022-07-07 NOTE — Progress Notes (Signed)
Physical Therapy Session Note  Patient Details  Name: Desiree Stone MRN: [id number] Date of Birth: 1975/08/07  Today's Date: 07/07/2022 PT Individual Time: 1055-1205 PT Individual Time Calculation (min): 70 min   Short Term Goals: Week 1:  PT Short Term Goal 1 (Week 1): Pt w// ambulate 100 ft w/ CGA PT Short Term Goal 2 (Week 1): Pt will decrease TUG by > 5 seconds PT Short Term Goal 3 (Week 1): Pt will perform transfers Mod I  Skilled Therapeutic Interventions/Progress Updates:  Pt received sitting upright at EOB and agreeable to therapy. Pt on 1L SpO2 throughout session via nasal cannula. Pt transported to gym dependently for energy conservation. Pt's SpO2 read 99% at the start of session. Pt denies any pain. LLE remains numb at rest and when weight bearing pt experiences and tingling sensation.    Transfers: Sit to stand supervision assist using RW--> SPC. Stand pivot transfers were performed w/ CGA using RW--> SPC.   Gait training ~73f w/ RW and CGA. Therapist managing dynamap to track pt's SpO2, which started to decrease around 45 ft of gait to 96%. Pt ambulated another 479fand SpO2 dropped to 93%. After ~30 second seated rest break pts O2 increased to 96%. Pt asymptomatic. In order to simulate pt walking into [facility]arrow bathroom w/o RW, therapist set up chair ~1.12f43fn front of an object fo pt could practice walking 6ft70fom the door to the toilet. Pt performed this 3x w/ CGA. 2x w/ no AD and 1x with SPC. Pt showed improved gait pattern w/ SPC. Pt ambulated ~15 ft x2 w/ SPC and CGA with vc for proper gait pattern and management of cane. Pt demonstrated improved stance time on LLE and a more functional gait pattern. Pt expressed that she [facility] the [facility] the [facility].   There-Ex for LE strengthening and wt shifts. Pt performed step ups on 3 in step.2x5 leading w/ RLE.Pt used RW to assist w/ balance and power w/ CGA. Pt expressed worry w/ stepping up leading w/ LLE. Instead therapist  instructed pt do a forward wt shift on LLE instead of stepping up completley Pt tolerated the exercise, vc necessary to lift L knee up to assist clearing the step to prevent toe dragging. Eccentric sit to stands 2x6 done using RW and supervision assist. Pt instructed to sit down slowly. Pt returned to room dependently and left seated in [facility] in reach and all needs met.    Therapy Documentation Precautions:  Precautions Precautions: Fall Precaution Comments: watch O2 keep >90% Restrictions Weight Bearing Restrictions: No General:   Pain:       Therapy/Group: Individual Therapy  Priyana Mccarey 07/07/2022, 11:45 AM

## 2022-07-07 NOTE — Progress Notes (Signed)
Pt de-cannulated at this time, VS stable.

## 2022-07-07 NOTE — Progress Notes (Addendum)
07/07/2022 Discussed with NP. Okay to decannulate. Please page [place] when closer to DC so we can set up outpatient pulm/sleep f/u.  Will put in for CPAP qHS to see if she tolerates.  Refer to ENT if no stoma closure in 3 weeks.  Erskine Emery MD PCCM     NAME:  Desiree Stone, MRN:  [id number], DOB:  12-19-1975, LOS: 2 ADMISSION DATE:  07/05/2022, CONSULTATION DATE:  07/06/22 REFERRING MD:  Letta Pate, CHIEF COMPLAINT:  SOB   History of Present Illness:  47 year old woman who presented to [facility] 1/17 with multifactorial acute hypoxemic respiratory failure (COPD, new heart failure, ?asthma).  Unable to be extubated and some question of upper airway swelling so underwent trach by ENT on 06/19/22.  Now TC, in IP rehab so PCCM consulted to assist with trach wean.  Current prox XLT cuffless in place.  Using PMV easily.  Wants trach out.  Baseline seems like insidious onset CHF symptoms and inability to tolerate CPAP mask.  Unfortunately lost to f/u to review downloads.  Also needs to establish care with someone for her COPD/asthma.  MMRC   0 Dyspnea only with strenuous exercise  1 Dyspnea when hurrying or walking up a slight hill  2 Walks slower than people of the same age because of dyspnea or has to stop for breath when walking at own pace  3 Stops for breath after walking 100 yards (73 m) or after a few minutes  4 Too dyspneic to leave house or breathless when dressing     Pertinent  Medical History   Past Medical History:  Diagnosis Date   Asthma    Chronic back pain    Diabetes mellitus without complication (HCC)    Hypertension    Iron deficiency anemia    Leiomyoma    s/p uterine artery emoblization   Sleep apnea    Tobacco use disorder      Significant Hospital Events: Including procedures, antibiotic start and stop dates in addition to other pertinent events   06/07/22: Admit to ICU with worsening acute respiratory failure suspect secondary to acute new onset heart failure  int he setting of suspected pulmonary hypertension & chronic COPD, OSA, asthma requiring emergent intubation and mechanical ventilatory support.  06/08/22- patient is s/p SBT with severe secretions, she did diurese >800cc today with lasix challenge, CXR with progressive pleural effusions. She has family coming in today. She's uisng OG and weve tranistioned to prednisone taper from solumedrol. Adding aldactone per tube. Iron workup today.  06/09/22-on minimal vent support, will attempt SBT.  Tracheal aspirate growing gram-positive cocci, ceftriaxone added 1/20 extubated and failed, re-intubated 1/21 severe COPD, remains on vent 1/22 severe COPD, remains on vent 1/23 severe upper airway swelling, ENT consulted 1/24 severe resp failure 1/26: Remains on vent, hemodynamically stable, tentative plan for Trach next Monday 1/29. 1/27: Remains on vent, no acute changes or events, awaiting Trach on Monday. D/C Solumedrol  1/28: Remains on vent, no acute changes.  BP soft, Lasix decreased to 40 mg daily. Low grade temperature overnight (T max 100), Obtain CT Chest 1/28: CT Chest>>Bilateral lower lobe consolidation, and right middle lobe       subsegmental atelectasis. No evidence of central endobronchial obstruction.       no evidence of pleural effusion. 2/01: CT Abd>>Portion of the transverse colon overlies the distal stomach.       Consider visualization of the transverse colon during percutaneous       gastrostomy tube  placement. Persistent consolidation in both lower lobes.       Chronic left adrenal nodule. Minimal change since 2011 and likely a benign lesion.       Gallbladder distention without inflammatory changes. 2/02: Tolerated trach collar throughout the day  2/02: Speech therapy consulted for PMV trial, however pt did not tolerate PMV placement due to inability to redirect airflow superiorly for phonation and breathing.  Possibly will require trach downsize when deemed appropriate 2/04: No  acute events overnight.  Currently tolerating trach collar  2/06: No acute vents, tolerating TC during the day and resting on vent at night. Speech therapy/PT/OT following. ENT consulted pt pending downsize of trach on       02/12  06/30/22- patient improved slightly , plan for downsize trache collar.  She has defervesced post Cefepime course now planning for percutaneous PEG. We dcd non essential medications today. She's on trache collar off vent and she may qualify for CIR.  07/01/22- patient had NG clogged.  She will need that replaced to get nourishment.  She has not required NIV for last few days and is lucid awake with stable vital signs.  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  To be removed  Objective   Blood pressure 131/69, pulse (!) 111, temperature 98.4 F (36.9 C), temperature source Oral, resp. rate 18, height 4' 11"$  (1.499 m), weight 129.1 kg, last menstrual period 05/30/2022, SpO2 95 %.    FiO2 (%):  [35 %] 35 %   Intake/Output Summary (Last 24 hours) at 07/07/2022 0843 Last data filed at 07/06/2022 2055 Gross per 24 hour  Intake 727 ml  Output --  Net 727 ml   Filed Weights   07/05/22 1558 07/06/22 0500 07/07/22 0456  Weight: 131.1 kg 126.8 kg 129.1 kg    Examination: General: Obese female sitting on side of the bed eating HEENT: MM pink/moist tracheostomy has been capped for 24 hours without distress Neuro: Grossly intact  CV: Heart sounds are regular PULM: Diminished in the bases  GI: soft, bsx4 active  GU: Extremities: warm/dry, voids 1+ edema  Skin: no rashes or lesions   Resolved Hospital Problem list   N/A  Assessment & Plan:  Chronic hypoxemic and hypercarbic resp failure due to ?COPD, OHS/OSA, cor pulmonale, pneumonia- improving after abx, reconditioning,  diuresis Tracheostomy has been capped for 24 hours and is in no distress. Will write order to DC tracheostomy Pulmonary critical care will be available as needed She will need outpatient follow-up at our [facility] office for CPAP and repeat pulmonary function test     Best Practice (right click and "Reselect all SmartList Selections" daily)  Per primary  Labs   CBC: Recent Labs  Lab 07/01/22 0427 07/02/22 0606 07/03/22 0354 07/06/22 0648  WBC 9.5 11.1* 12.7* 8.6  NEUTROABS  --   --   --  5.8  HGB 11.0* 11.1* 11.2* 11.2*  HCT 39.8 40.5 41.3 39.8  MCV 74.8* 75.6* 76.6* 76.5*  PLT 364 359 333 [id number]    Basic Metabolic Panel: Recent Labs  Lab 07/01/22 0427 07/02/22 0606 07/03/22 0354 07/06/22 0648  NA 136 138 139 134*  K 3.4* 3.7 3.4* 3.3*  CL 93* 98 99 95*  CO2 34* 31 30 28  $ GLUCOSE 117* 151* 167* 117*  BUN 23* 24* 24*  6  CREATININE 0.47 0.54 0.50 0.48  CALCIUM 9.2 9.0 9.1 9.1  PHOS 6.3* 5.4* 4.7*  --    GFR: Estimated Creatinine Clearance: 105.4 mL/min (by C-G formula based on SCr of 0.48 mg/dL). Recent Labs  Lab 07/01/22 0427 07/02/22 0606 07/03/22 0354 07/06/22 0648  WBC 9.5 11.1* 12.7* 8.6    Liver Function Tests: Recent Labs  Lab 07/01/22 0427 07/02/22 0606 07/03/22 0354 07/06/22 0648  AST  --   --   --  15  ALT  --   --   --  24  ALKPHOS  --   --   --  74  BILITOT  --   --   --  0.3  PROT  --   --   --  6.9  ALBUMIN 2.9* 3.1* 3.1* 2.8*   No results for input(s): "LIPASE", "AMYLASE" in the last 168 hours. No results for input(s): "AMMONIA" in the last 168 hours.  ABG    Component Value Date/Time   PHART 7.42 06/07/2022 0828   PCO2ART 60 (H) 06/07/2022 0828   PO2ART 68 (L) 06/07/2022 0828   HCO3 38.9 (H) 06/07/2022 0828   O2SAT 95.7 06/07/2022 0828     Coagulation Profile: No results for input(s): "INR", "PROTIME" in the last 168 hours.  Cardiac Enzymes: No results for input(s): "CKTOTAL", "CKMB", "CKMBINDEX", "TROPONINI" in the  last 168 hours.  HbA1C: Hgb A1c MFr Bld  Date/Time Value Ref Range Status  06/07/2022 04:54 AM 9.0 (H) 4.8 - 5.6 % Final    Comment:    (NOTE) Pre diabetes:          5.7%-6.4%  Diabetes:              >6.4%  Glycemic control for   <7.0% adults with diabetes     CBG: Recent Labs  Lab 07/06/22 1127 07/06/22 1637 07/06/22 2046 07/07/22 0604 07/07/22 0631  GLUCAP 208* 132* 131* 130* [place] Minor ACNP Acute Care Nurse Practitioner [place] Please consult Amion 07/07/2022, 8:43 AM

## 2022-07-07 NOTE — IPOC Note (Signed)
Overall Plan of Care [facility]) Patient Details Name: Desiree Stone MRN: [id number] DOB: [birth year]  Admitting Diagnosis: [facility] Problems: Principal Problem:   Debility     Functional Problem List: Nursing Safety, Bowel, Endurance, Medication Management, Nutrition, Pain  PT Balance, Sensory, Endurance, Motor  OT Balance, Endurance, Skin Integrity, Sensory  SLP Nutrition  TR         Basic ADL's: OT Eating, Grooming, Bathing, Dressing, Toileting     Advanced  ADL's: OT Simple Meal Preparation     Transfers: PT Car  OT Toilet, Tub/Shower     Locomotion: PT Ambulation, Wheelchair Mobility, Stairs     Additional Impairments: OT Fuctional Use of Upper Extremity  SLP Swallowing, Communication expression    TR      Anticipated Outcomes Item Anticipated Outcome  Self Feeding MOD I  Swallowing  mod I   Basic self-care  MOD I  Toileting  MOD I toileting, S shower   Bathroom Transfers MOD I toilet; S shower  Bowel/Bladder  manage bowel w mod I assist  Transfers  Mod I w/ transfers  Locomotion  Modified I w/ AD for short distances  Communication  mod I  Cognition  N/A  Pain  < 4 with prns  Safety/Judgment  manage w cues   Therapy Plan: PT Intensity: Minimum of 1-2 x/day ,45 to 90 minutes PT Frequency: 5 out of 7 days PT Duration Estimated Length of Stay: 10-12 days OT Intensity: Minimum of 1-2 x/day, 45 to 90 minutes OT Frequency: 5 out of 7 days OT Duration/Estimated Length of Stay: 9-12 days SLP Intensity: Minumum of 1-2 x/day, 30 to 90 minutes SLP Frequency: 1 to 3 out of 7 days SLP Duration/Estimated Length of Stay: 9-12 days   Team Interventions: Nursing Interventions Patient/Family Education, Pain Management, Dysphagia/Aspiration Precaution Training, Medication Management, Discharge Planning, Bowel Management, Disease Management/Prevention  PT interventions Community reintegration, DME/adaptive equipment instruction, Ambulation/gait  training, Neuromuscular re-education, Psychosocial support, Stair training, UE/LE Strength taining/ROM, Wheelchair propulsion/positioning, Training and development officer, Discharge planning, Functional electrical stimulation, Pain management, Therapeutic Activities, UE/LE Coordination activities, Disease management/prevention, Functional mobility training, Patient/family education, Splinting/orthotics, Therapeutic Exercise, Visual/perceptual remediation/compensation  OT Interventions Balance/vestibular training, Discharge planning, Pain management, Self Care/advanced ADL retraining, Therapeutic Activities, UE/LE Coordination activities, Visual/perceptual remediation/compensation, Therapeutic Exercise, Skin care/wound managment, Patient/family education, Functional mobility training, Disease mangement/prevention, Community reintegration, Engineer, drilling, Neuromuscular re-education, Psychosocial support, Splinting/orthotics, UE/LE Strength taining/ROM, Wheelchair propulsion/positioning  SLP Interventions Dysphagia/aspiration precaution training, Patient/family education, Speech/Language facilitation  TR Interventions    SW/CM Interventions Discharge Planning, Psychosocial Support, Disease Management/Prevention, Patient/Family Education   Barriers to Discharge MD  Medical stability, Trach, and Weight  Nursing Decreased caregiver support, Trach 1 level/level entry w children and parents to assist prn  PT Inaccessible home environment, Decreased caregiver support, Home environment access/layout, Weight, New oxygen STE Home, endurance, SpO2  OT Decreased caregiver support, Inaccessible home environment, Home environment access/layout    SLP Trach, Nutrition means    SW Lack of/limited family support, Insurance underwriter for SNF coverage     Team Discharge Planning: Destination: PT-Home ,OT- Home , SLP-Home Projected Follow-up: PT-Home health PT, OT-  Home health OT, SLP-Other (comment)  (TBD) Projected Equipment Needs: PT-To be determined, OT- 3 in 1 bedside comode, To be determined, SLP-To be determined Equipment Details: PT- , OT-no room for shower seat in shower per pt report Patient/family involved in discharge planning: PT- Patient,  OT-Patient, SLP-Patient  MD ELOS: 8-10d Medical Rehab Prognosis:  Good Assessment: The patient has  been admitted for CIR therapies with the diagnosis of debility. The team will be addressing functional mobility, strength, stamina, balance, safety, adaptive techniques and equipment, self-care, bowel and bladder mgt, patient and caregiver education, trach weaning. Goals have been set at Mod I. Anticipated discharge destination is Home.        See Team Conference Notes for weekly updates to the plan of care

## 2022-07-07 NOTE — Procedures (Signed)
Tracheostomy Change Note  Patient Details:   Name: Desiree Stone DOB: 1976/03/03 MRN: [id number]    Airway Documentation:     Evaluation  O2 sats: stable throughout Complications: No apparent complications Patient did tolerate procedure well. Bilateral Breath Sounds: Clear   Pt de-cannulated per order. Site assessed and cleaned, guaze placed and secured with tape. Pt tolerated procedure well with no apparent complications.    Lucita Lora 07/07/2022, 9:02 AM

## 2022-07-07 NOTE — Progress Notes (Signed)
Occupational Therapy Session Note  Patient Details  Name: Desiree Stone MRN: [id number] Date of Birth: 1976-01-28  Today's Date: 07/07/2022 OT Individual Time: [id number] OT Individual Time Calculation (min): 75 min    Short Term Goals: Week 1:  OT Short Term Goal 1 (Week 1): Pt will thread BL Einto pants with AE PRN OT Short Term Goal 2 (Week 1): Pt will complete oral care in standing with no change in vitals to demo improved endurance OT Short Term Goal 3 (Week 1): Pt will complete 1/3 steps of toileting OT Short Term Goal 4 (Week 1): Pt will transfer to toilet with S and LRAD  Skilled Therapeutic Interventions/Progress Updates:    Pt received sitting EOB with PMSV on and on 3L of O2.  Pt wanted to try herself on room air to see how she could tolerate it.  For 20 - 30 min O2 removed and pt was able to be active in the room with her sats close to 90-92%.  Pt moved with RW with only light CGA to close S as she stood, ambulated to wc at sink and then into bathroom. Pt opted to not shower yet.  (Trach to be removed this am).  Used this session to problem solve strategies for her to be able to cleanse self front and back sides with bathing and post toileting.  Pt did best when sitting on toilet and either straddling legs wide or standing with a wide plie stance.  She felt she was reaching well but did assist for a thorough cleansing.  After dressing encouraged her to use 2L of O2 to not over tax herself and when she does more challenging exercises with PT.  RT came to remove her trach.  Discussed home set up.  Pt overall did extremely well today and needs to focus on activity tolerance.  Pt resting EOB with all needs met.   Therapy Documentation Precautions:  Precautions Precautions: Fall Precaution Comments: watch O2 keep >90% Restrictions Weight Bearing Restrictions: No    Vital Signs: Therapy Vitals Pulse Rate: 87 Resp: 17 Patient Position (if appropriate): Sitting Oxygen  Therapy SpO2: 96 % O2 Device: Nasal Cannula O2 Flow Rate (L/min): 4 L/min Pain: Pain Assessment Pain Scale: 0-10 Pain Score: 0-No pain ADL: ADL Eating: Set up Grooming: Setup Where Assessed-Grooming: Standing at sink Upper Body Bathing: Supervision/safety Where Assessed-Upper Body Bathing: Sitting at sink Lower Body Bathing: Minimal assistance Where Assessed-Lower Body Bathing: Sitting at sink, Other (Comment) (toilet) Upper Body Dressing: Supervision/safety Where Assessed-Upper Body Dressing: Edge of bed Lower Body Dressing: Minimal assistance Where Assessed-Lower Body Dressing: Other (Comment) (toilet) Toileting: Moderate assistance Where Assessed-Toileting: Glass blower/designer: Therapist, music Method: Counselling psychologist: Grab bars  Therapy/Group: Individual Therapy  [place] 07/07/2022, 12:41 PM

## 2022-07-07 NOTE — Progress Notes (Signed)
Speech Language Pathology Daily Session Note  Patient Details  Name: Desiree Stone MRN: [id number] Date of Birth: 1976/03/29  Today's Date: 07/07/2022 SLP Individual Time: 1300-1355 SLP Individual Time Calculation (min): 55 min  Short Term Goals: Week 1: SLP Short Term Goal 1 (Week 1): Patient will donn/doff PMSV with independence across 2 consecutive sessions SLP Short Term Goal 2 (Week 1): Patient will implement vocal intensity/breath support techniques with sup A verbal cues SLP Short Term Goal 3 (Week 1): Patient will independently recall and verbalize vocal hygiene considerations following education SLP Short Term Goal 4 (Week 1): Patient will tolerate current diet with minimal overt or subtle s/sx of aspiration and with sup A verbal cues for implementation of swallowing compensatory strategies  Skilled Therapeutic Interventions: Pt seen this date for skilled ST intervention targeting communication/speech goals outlined above. Pt received awake/alert and OOB in w/c; nasal cannula donned, receiving 1 L O2. Decannulated by RT earlier this AM and tolerating well. Agreeable to intervention in hospital room. Pleasant and participatory throughout.  SLP facilitated today's session by providing skilled education re: use of diaphragmatic breathing for speech related tasks and importance + rationale for vocal hygiene. Pt was actively involved in all education and provided understanding via teach back. Informally, pt demonstrates adequate insight/awareness, recall, attention, and problem-solving throughout today's session. Pt declined PO intake due to satiety following lunch. Pt reports following previously provided SLP recommendations to include implementation of environmental modifications (turning TV off and getting close to communication partner when talking), not talking too loudly/shouting, and to use diaphragmatic breathing techniques to aid in vocal intensity vs yelling. Pt implemented all  techniques mentioned at the Mod I level. Despite pt's decreased vocal intensity, she was fully intelligible to this clinician. Will plan to follow up for diet tolerance and likely s/o.  Pt left in room and OOB in w/c with all safety measures activated and call bell within reach. Continue per current ST POC.  Pain No pain reported; NAD  Therapy/Group: Individual Therapy  Amberlin Utke A Moraima Burd 07/07/2022, 3:24 PM

## 2022-07-07 NOTE — Progress Notes (Signed)
OT therapist informed this nurse x2 that patient needed "something for anxiety". Informed therapist and patient there are no PRN medications for anxiety. Patient states she feels "itchy" and "anxious" but has not had any medication for anxiety on this unit prior. Informed patient I will let the MD know. Informed MD  [place].

## 2022-07-08 DIAGNOSIS — R5381 Other malaise: Principal | ICD-10-CM

## 2022-07-08 LAB — GLUCOSE, CAPILLARY
Glucose-Capillary: 106 mg/dL — ABNORMAL HIGH (ref 70–99)
Glucose-Capillary: 115 mg/dL — ABNORMAL HIGH (ref 70–99)
Glucose-Capillary: 133 mg/dL — ABNORMAL HIGH (ref 70–99)
Glucose-Capillary: 148 mg/dL — ABNORMAL HIGH (ref 70–99)

## 2022-07-08 NOTE — Progress Notes (Signed)
CPAP set up and placed next to pt. Pt will call if assistance is needed.

## 2022-07-08 NOTE — Progress Notes (Signed)
Speech Language Pathology Daily Session Note  Patient Details  Name: Desiree Stone MRN: [id number] Date of Birth: Feb 25, 1976  Today's Date: 07/08/2022 SLP Individual Time: 0730-0830 SLP Individual Time Calculation (min): 60 min  Short Term Goals: Week 1: SLP Short Term Goal 1 (Week 1): Patient will donn/doff PMSV with independence across 2 consecutive sessions SLP Short Term Goal 2 (Week 1): Patient will implement vocal intensity/breath support techniques with sup A verbal cues SLP Short Term Goal 3 (Week 1): Patient will independently recall and verbalize vocal hygiene considerations following education SLP Short Term Goal 4 (Week 1): Patient will tolerate current diet with minimal overt or subtle s/sx of aspiration and with sup A verbal cues for implementation of swallowing compensatory strategies  Skilled Therapeutic Interventions:   Pt was seen for skilled ST services with focus on dysphagia and voice. Upon entry, pt was sitting on side of bed. Pt reports trach was removed yesterday. SLP observed pt with breakfast. Pt tolerated current diet with no overt s/sx of aspiration. SLP rec trial of regular diet at next session. SLP edu on vocal hygiene strategies and provided pt with handout. Continued edu on diaphragmatic breathing is warranted. Pt was able to recall previous strategies given to her by primary SLP. Pt was left in bed, nasal cannula in place, all immediate needs within reach, and bed alarm activated. Cont with current POC.   Pain Pain Assessment Pain Scale: 0-10 Pain Score: 2  Pain Location: Nose Pain Descriptors / Indicators: Discomfort (nares dry) Pain Intervention(s): Other (Comment) (RT brought water for nasal cannula)  Therapy/Group: Individual Therapy  Verdene Lennert 07/08/2022, 8:27 AM

## 2022-07-08 NOTE — Progress Notes (Signed)
Physical Therapy Session Note  Patient Details  Name: Desiree Stone MRN: [id number] Date of Birth: [birth year]  Today's Date: 07/08/2022 PT Individual Time: 1123-1209 PT Individual Time Calculation (min): 46 min   Short Term Goals: Week 1:  PT Short Term Goal 1 (Week 1): Pt w// ambulate 100 ft w/ CGA PT Short Term Goal 2 (Week 1): Pt will decrease TUG by > 5 seconds PT Short Term Goal 3 (Week 1): Pt will perform transfers Mod I  Skilled Therapeutic Interventions/Progress Updates:    Pt received using the bathroom. Nurse helped pt to the bathroom. Pt continent of bowel and bladder and was independent w/ pericare. Pt ambulated out the bathroom w/ RW and CGA and performed hand hygiene standing at sink independently. Pt transported to therapy gym dependently. Sit to stand and stand pivot transfers done using SPC and CGA. Pt performed 45 ft x2 of gait training using SPC. SpO2 checked after last trial of gait read 88% pt instructed to perform purse liped breathing and w/in ~15 seconds O2 increased to 98%. Pt engaged in stair training 4 steps x2 at 6" height w/ CGA. Pt struggled w/ descent having to turn trunk to the R to advance LLE. Nu-step 53mn for LE strengthening in last 30 seconds pt expressed that she was feeling a "tingling" feeling in L medial thigh. Discomfort subsided when exercises ended. Pt continues to experience L foot numbness and tingling when wb. Pt transported back to room and transferred to siting a EOB w/ supervision assist and left w/ bed alarm on, call bell in reach and all needs met.   Therapy Documentation Precautions:  Precautions Precautions: Fall Precaution Comments: watch O2 keep >90% Restrictions Weight Bearing Restrictions: No General:     Pain: pt denies any pain        Therapy/Group: Individual Therapy  Toran Murch 07/08/2022, 11:41 AM

## 2022-07-08 NOTE — Progress Notes (Signed)
Patient got decannulated 2/16 and was not put on CPAP due to stoma healing. Patient also stated she has not worn CPAP since 2023 August, says she feels like she can't breathe with CPAP on. Called Respiratory charge to confirm and charge also stated due to her just getting de-cannulated to wait.

## 2022-07-08 NOTE — Progress Notes (Signed)
PROGRESS NOTE   Subjective/Complaints:  Pt doing well this morning, very happy with having trach decannulated. Cough now improved. Having some L hip soreness sometimes but pain meds help. Had a BM this morning. Urinating well. Denies any other complaints at this time.   ROS: +L hip pain occasionally. Denies fevers, chills, CP, SOB, cough, abd pain, n/v/d/c  Objective:   No results found. Recent Labs    07/06/22 0648  WBC 8.6  HGB 11.2*  HCT 39.8  PLT 255    Recent Labs    07/06/22 0648  NA 134*  K 3.3*  CL 95*  CO2 28  GLUCOSE 117*  BUN 6  CREATININE 0.48  CALCIUM 9.1     Intake/Output Summary (Last 24 hours) at 07/08/2022 0719 Last data filed at 07/07/2022 1820 Gross per 24 hour  Intake 954 ml  Output --  Net 954 ml         Physical Exam: Vital Signs Blood pressure 135/69, pulse (!) 109, temperature 98.3 F (36.8 C), temperature source Oral, resp. rate 18, height 4' 11"$  (1.499 m), weight 134.1 kg, SpO2 97 %.  General: No acute distress, sitting on edge of bed Mood and affect are appropriate ENT- trach decannulated, area covered with dressing. MMM.  Heart: Regular rate and rhythm no rubs murmurs or extra sounds Lungs: Clear to auscultation, breathing unlabored, no rales or wheezes, on RA mostly but did place [place] on later (2L) Abdomen: Positive bowel sounds, soft nontender to palpation, nondistended Extremities: No clubbing, cyanosis, or edema Skin: No evidence of breakdown, no evidence of rash  Prior exam: Neurologic: Cranial nerves II through XII intact, motor strength is 5/5 in bilateral deltoid, bicep, tricep, grip, hip flexor, knee extensors, ankle dorsiflexor and plantar flexor Sensory exam normal sensation to light touch and proprioception in bilateral upper and lower extremities Cerebellar exam normal finger to nose to finger as well as heel to shin in bilateral upper and lower  extremities Musculoskeletal: Full range of motion in all 4 extremities. No joint swelling   Assessment/Plan: 1. Functional deficits which require 3+ hours per day of interdisciplinary therapy in a comprehensive inpatient rehab setting. Physiatrist is providing close team supervision and 24 hour management of active medical problems listed below. Physiatrist and rehab team continue to assess barriers to discharge/monitor patient progress toward functional and medical goals  Care Tool:  Bathing    Body parts bathed by patient: Right arm, Left arm, Chest, Abdomen, Front perineal area, Right upper leg, Left upper leg, Face   Body parts bathed by helper: Buttocks, Right lower leg, Left lower leg     Bathing assist Assist Level: Moderate Assistance - Patient 50 - 74%     Upper Body Dressing/Undressing Upper body dressing   What is the patient wearing?: Pull over shirt    Upper body assist Assist Level: Supervision/Verbal cueing    Lower Body Dressing/Undressing Lower body dressing      What is the patient wearing?: Pants     Lower body assist Assist for lower body dressing: Maximal Assistance - Patient 25 - 49%     Toileting Toileting    Toileting assist Assist for toileting: Maximal  Assistance - Patient 25 - 49%     Transfers Chair/bed transfer  Transfers assist     Chair/bed transfer assist level: Minimal Assistance - Patient > 75%     Locomotion Ambulation   Ambulation assist      Assist level: Contact Guard/Touching assist Assistive device: Walker-rolling Max distance: 55f   Walk 10 feet activity   Assist     Assist level: Minimal Assistance - Patient > 75% Assistive device: Walker-rolling   Walk 50 feet activity   Assist Walk 50 feet with 2 turns activity did not occur: Safety/medical concerns         Walk 150 feet activity   Assist Walk 150 feet activity did not occur: Safety/medical concerns         Walk 10 feet on uneven  surface  activity   Assist     Assist level: Contact Guard/Touching assist Assistive device:  (Arts development officer   Wheelchair     Assist Is the patient using a wheelchair?: Yes Type of Wheelchair: Manual    Wheelchair assist level: Contact Guard/Touching assist Max wheelchair distance: 569f   Wheelchair 50 feet with 2 turns activity    Assist        Assist Level: Minimal Assistance - Patient > 75%   Wheelchair 150 feet activity     Assist      Assist Level: Maximal Assistance - Patient 25 - 49%   Blood pressure 135/69, pulse (!) 109, temperature 98.3 F (36.8 C), temperature source Oral, resp. rate 18, height 4' 11"$  (1.499 m), weight 134.1 kg, SpO2 97 %.  Medical Problem List and Plan: 1. Functional deficits secondary to Debility due to respiratory failure, CHF, COPD , Pneumonia              -patient may  shower             -ELOS/Goals: 8-10 days, PT and OT sup to mod I, SLP mod I             -Cont CIR, PT/OT/SLP -Pt wondering about d/c date-- team meeting schedule discussed, defer planning to weekday team 2.  Antithrombotics: -DVT/anticoagulation:  Pharmaceutical: Lovenox 6578mD             -antiplatelet therapy: N/A 3. Pain Management: Oxycodone 58m61mh prn.  4. Mood/Behavior/Sleep: LCSW to follow for evaluation and support. Zoloft 50mg37mly             -antipsychotic agents: N/A             --Melatonin 5mg Q4mprn insomnia. Prn Trazodone  -Hydroxyzine 58mg T20mRN for itching/anxiety 5. Neuropsych/cognition: This patient is capable of making decisions on his own behalf. 6. Skin/Wound Care: Routine pressure relief measures.  -add juven for nutritional support--not added, weekday team to assess if this is still needed 7. Fluids/Electrolytes/Nutrition:  Monitor I/O. Monitor weekly labs starting 07/10/22 -cont multivitamins 8.Hypercarbic respiratory failure s/p trach:             -Supplemental O2 to maintain O2 sats above 88%  -07/08/22 trach  decannulated on 07/07/22, doing well, monitor 9. COPD: Budesonide nebs d/c 02/09 and on duo nebs BID+q4h PRN now. 10. Congestive heart failure: Strict I/O--lasix decreased to 40 mg/daily on 02/13. Daily weight.             --weight at admission 300 lbs-->discharge wt 284 lbs (129kg)             --Monitor  for signs of overload.  -  07/08/22 weight up a bit, no edema noted, monitor for now [facility] Weights   07/06/22 0500 07/07/22 0456 07/08/22 0437  Weight: 126.8 kg 129.1 kg 134.1 kg    11. T2DM: hgb A1c-9.0 and poorly controlled.  --continue Insulin Glargine 30 units BID w/SSI meal coverage, 7 units novolog mealtime TID -07/08/22 well controlled, monitor CBG (last 3)  Recent Labs    07/07/22 1620 07/07/22 2058 07/08/22 0549  GLUCAP 125* 138* 106*     12. Hypokalemia: Has been supplemented intermittently -07/08/22 K+ 3.3 on 2/15, cont Klor 57mq BID, monitor weekly labs next on 07/10/22 13. Leucocytosis: Improved but persistent and trending back up. WBC 12.7 07/03/22             --monitor for fevers and other signs of infection.   -07/08/22 WBC down to 8.6 on 07/06/22, monitor weekly labs next 07/10/22 14. Iron deficiency anemia: Recheck CBC in am. HGB 11.2 2/12; stable, monitor weekly labs 15.  Depression: Zoloft 535mdaily 16. Dysphagia, Dys 1/thin diet             -Continue SLP 17. Dyslipidemia: Total 134/LDL 83/Trig 1641 (highest) in 05/2022; continue Lipitor 4073mD    LOS: 3 days A FACE TO FACWilberforce17/2024, 7:19 AM

## 2022-07-08 NOTE — Progress Notes (Signed)
Physical Therapy Session Note  Patient Details  Name: Desiree Stone MRN: [id number] Date of Birth: 1976-01-30  Today's Date: 07/08/2022 PT Missed Time: 45 Minutes Missed Time Reason: Patient fatigue;Unavailable (Comment) (unable to rouse from sleep)  Short Term Goals: Week 1:  PT Short Term Goal 1 (Week 1): Pt w// ambulate 100 ft w/ CGA PT Short Term Goal 2 (Week 1): Pt will decrease TUG by > 5 seconds PT Short Term Goal 3 (Week 1): Pt will perform transfers Mod I  Skilled Therapeutic Interventions/Progress Updates:   Pt missed 45 min of skilled therapy due to fatigue and inability to be roused from sleep. Will re-attempt as schedule and pt availability permits.   Therapy Documentation Precautions:  Precautions Precautions: Fall Precaution Comments: watch O2 keep >90% Restrictions Weight Bearing Restrictions: No General: PT Amount of Missed Time (min): 45 Minutes PT Missed Treatment Reason: Patient fatigue;Unavailable (Comment) (unable to rouse from sleep) Vital Signs: Therapy Vitals Temp: 98.5 F (36.9 C) Pulse Rate: (!) 110 Resp: 18 BP: (!) 122/55 Patient Position (if appropriate): Sitting Oxygen Therapy SpO2: 96 % O2 Device: Room Air Pain:  No indication of pain with pt asleep on entrance to room.   Therapy/Group: Individual Therapy  Alger Simons PT, DPT, CSRS 07/08/2022, 2:20 PM

## 2022-07-08 NOTE — Progress Notes (Signed)
Occupational Therapy Session Note  Patient Details  Name: Desiree Stone MRN: [id number] Date of Birth: Feb 21, 1976  Today's Date: 07/08/2022 OT Individual Time: 0915-1000 OT Individual Time Calculation (min): 45 min    Short Term Goals: Week 1:  OT Short Term Goal 1 (Week 1): Pt will thread BL Einto pants with AE PRN OT Short Term Goal 2 (Week 1): Pt will complete oral care in standing with no change in vitals to demo improved endurance OT Short Term Goal 3 (Week 1): Pt will complete 1/3 steps of toileting OT Short Term Goal 4 (Week 1): Pt will transfer to toilet with S and LRAD  Skilled Therapeutic Interventions/Progress Updates:    Pt received in bed sleeping but awoke easily. Pt agreeable to a shower.  Pt sat to EOB and sat for a few minutes then stood and used RW to bathroom all with Supervision.  She toileted S,  transferred to shower S,  but did need A to reach feet and bottom with bathing.  Returned to toilet to dress.  Min with pants.  Pt on room air the entire session and O2 sat 92-95%.  Great participation today. Resting in bed with all needs met.   Therapy Documentation Precautions:  Precautions Precautions: Fall Precaution Comments: watch O2 keep >90% Restrictions Weight Bearing Restrictions: No   Pain:  No c/o pain    Therapy/Group: Individual Therapy  [place] 07/08/2022, 12:54 PM

## 2022-07-09 LAB — GLUCOSE, CAPILLARY
Glucose-Capillary: 85 mg/dL (ref 70–99)
Glucose-Capillary: 132 mg/dL — ABNORMAL HIGH (ref 70–99)
Glucose-Capillary: 101 mg/dL — ABNORMAL HIGH (ref 70–99)
Glucose-Capillary: 111 mg/dL — ABNORMAL HIGH (ref 70–99)
Glucose-Capillary: 159 mg/dL — ABNORMAL HIGH (ref 70–99)

## 2022-07-09 NOTE — Progress Notes (Signed)
CPAP setup and placed next to patient. Pt will call if assistance is needed.

## 2022-07-09 NOTE — Progress Notes (Signed)
PROGRESS NOTE   Subjective/Complaints:  Pt doing well this morning, slept very well last night. Pain well controlled, still just with occasional L hip pain but seems to be improving. Doesn't think she's gained weight-- states the hospital was doing standing weights but here she's getting bed weights. Hasn't noticed any increased swelling or SOB/other symptoms. Feels fine. LBM yesterday, urinating well. No other complaints or concerns.   ROS: +L hip pain occasionally. Denies fevers, chills, CP, SOB, increasing swelling, cough, abd pain, n/v/d/c  Objective:   No results found. No results for input(s): "WBC", "HGB", "HCT", "PLT" in the last 72 hours.  No results for input(s): "NA", "K", "CL", "CO2", "GLUCOSE", "BUN", "CREATININE", "CALCIUM" in the last 72 hours.   Intake/Output Summary (Last 24 hours) at 07/09/2022 1155 Last data filed at 07/09/2022 0720 Gross per 24 hour  Intake 1195 ml  Output --  Net 1195 ml        Physical Exam: Vital Signs Blood pressure (!) 140/86, pulse 100, temperature 98.7 F (37.1 C), resp. rate 18, height 4' 11"$  (1.499 m), weight 130.7 kg, SpO2 100 %.  General: No acute distress, sitting on edge of bed doing word puzzles Mood and affect are appropriate ENT- trach decannulated, area covered with dressing. MMM. Slightly raspy voice but strong phonation Heart: Regular rate and rhythm no rubs murmurs or extra sounds Lungs: Clear to auscultation, breathing unlabored, no rales or wheezes, on RA mostly but did place [place] on later (2L) Abdomen: Positive bowel sounds, soft nontender to palpation, nondistended Extremities: No clubbing, cyanosis, or edema Skin: No evidence of breakdown, no evidence of rash  Prior exam: Neurologic: Cranial nerves II through XII intact, motor strength is 5/5 in bilateral deltoid, bicep, tricep, grip, hip flexor, knee extensors, ankle dorsiflexor and plantar flexor Sensory  exam normal sensation to light touch and proprioception in bilateral upper and lower extremities Cerebellar exam normal finger to nose to finger as well as heel to shin in bilateral upper and lower extremities Musculoskeletal: Full range of motion in all 4 extremities. No joint swelling   Assessment/Plan: 1. Functional deficits which require 3+ hours per day of interdisciplinary therapy in a comprehensive inpatient rehab setting. Physiatrist is providing close team supervision and 24 hour management of active medical problems listed below. Physiatrist and rehab team continue to assess barriers to discharge/monitor patient progress toward functional and medical goals  Care Tool:  Bathing    Body parts bathed by patient: Right arm, Left arm, Chest, Abdomen, Front perineal area, Right upper leg, Left upper leg, Face   Body parts bathed by helper: Buttocks, Right lower leg, Left lower leg     Bathing assist Assist Level: Moderate Assistance - Patient 50 - 74%     Upper Body Dressing/Undressing Upper body dressing   What is the patient wearing?: Pull over shirt    Upper body assist Assist Level: Supervision/Verbal cueing    Lower Body Dressing/Undressing Lower body dressing      What is the patient wearing?: Pants     Lower body assist Assist for lower body dressing: Maximal Assistance - Patient 25 - 49%     Toileting Toileting  Toileting assist Assist for toileting: Maximal Assistance - Patient 25 - 49%     Transfers Chair/bed transfer  Transfers assist     Chair/bed transfer assist level: Minimal Assistance - Patient > 75%     Locomotion Ambulation   Ambulation assist      Assist level: Contact Guard/Touching assist Assistive device: Walker-rolling Max distance: 58f   Walk 10 feet activity   Assist     Assist level: Minimal Assistance - Patient > 75% Assistive device: Walker-rolling   Walk 50 feet activity   Assist Walk 50 feet with 2 turns  activity did not occur: Safety/medical concerns         Walk 150 feet activity   Assist Walk 150 feet activity did not occur: Safety/medical concerns         Walk 10 feet on uneven surface  activity   Assist     Assist level: Contact Guard/Touching assist Assistive device:  (Arts development officer   Wheelchair     Assist Is the patient using a wheelchair?: Yes Type of Wheelchair: Manual    Wheelchair assist level: Contact Guard/Touching assist Max wheelchair distance: 536f   Wheelchair 50 feet with 2 turns activity    Assist        Assist Level: Minimal Assistance - Patient > 75%   Wheelchair 150 feet activity     Assist      Assist Level: Maximal Assistance - Patient 25 - 49%   Blood pressure (!) 140/86, pulse 100, temperature 98.7 F (37.1 C), resp. rate 18, height 4' 11"$  (1.499 m), weight 130.7 kg, SpO2 100 %.  Medical Problem List and Plan: 1. Functional deficits secondary to Debility due to respiratory failure, CHF, COPD , Pneumonia              -patient may  shower             -ELOS/Goals: 8-10 days, PT and OT sup to mod I, SLP mod I             -Cont CIR, PT/OT/SLP -Pt wondering about d/c date-- team meeting schedule discussed, defer planning to weekday team 2.  Antithrombotics: -DVT/anticoagulation:  Pharmaceutical: Lovenox 6563mD             -antiplatelet therapy: N/A 3. Pain Management: Oxycodone 14m63mh prn.  4. Mood/Behavior/Sleep: LCSW to follow for evaluation and support. Zoloft 50mg27mly             -antipsychotic agents: N/A             --Melatonin 5mg Q15mprn insomnia. Prn Trazodone  -Hydroxyzine 14mg T58mRN for itching/anxiety 5. Neuropsych/cognition: This patient is capable of making decisions on his own behalf. 6. Skin/Wound Care: Routine pressure relief measures.  -add juven for nutritional support--not added, weekday team to assess if this is still needed 7. Fluids/Electrolytes/Nutrition:  Monitor I/O. Monitor weekly labs  starting 07/10/22 -cont multivitamins 8.Hypercarbic respiratory failure s/p trach:             -Supplemental O2 to maintain O2 sats above 88%  -07/08/22 trach decannulated on 07/07/22, doing well, monitor 9. COPD: Budesonide nebs d/c 02/09 and on duo nebs BID+q4h PRN now. 10. Congestive heart failure: Strict I/O--lasix decreased to 40 mg/daily on 02/13. Daily weight.             --weight at admission 300 lbs-->discharge wt 284 lbs (129.1kg 07/07/22)             --Monitor  for signs of overload.  -07/08/22 weight up a bit, no edema noted, monitor for now -07/09/22 wt up again but suspect it's because it's a bed weight; standing weights done while hospitalized; had standing weight now and is 130.7kg which is much more stable from prior; cont to monitor. No signs of fluid overload.  Filed Weights   07/08/22 0437 07/09/22 0507 07/09/22 0926  Weight: 134.1 kg (!) 138.1 kg 130.7 kg    11. T2DM: hgb A1c-9.0 and poorly controlled.  --continue Insulin Glargine 30 units BID w/SSI meal coverage, 7 units novolog mealtime TID -07/09/22 well controlled, monitor CBG (last 3)  Recent Labs    07/08/22 2151 07/09/22 0504 07/09/22 1112  GLUCAP 148* 85 132*   12. Hypokalemia: Has been supplemented intermittently -07/08/22 K+ 3.3 on 2/15, cont Klor 27mq BID, monitor weekly labs next on 07/10/22 13. Leucocytosis: Improved but persistent and trending back up. WBC 12.7 07/03/22             --monitor for fevers and other signs of infection.   -07/08/22 WBC down to 8.6 on 07/06/22, monitor weekly labs next 07/10/22 14. Iron deficiency anemia: Recheck CBC in am. HGB 11.2 2/12; stable, monitor weekly labs next 07/10/22 15.  Depression: Zoloft 526mdaily 16. Dysphagia, Dys 1/thin diet             -Continue SLP 17. Dyslipidemia: Total 134/LDL 83/Trig 1641 (highest) in 05/2022; continue Lipitor 4037mD    LOS: 4 days A FACE TO [place]AC[place]uchanan [place]18/2024, 11:55 AM

## 2022-07-10 DIAGNOSIS — R4589 Other symptoms and signs involving emotional state: Secondary | ICD-10-CM

## 2022-07-10 LAB — CBC
HCT: 36.7 % (ref 36.0–46.0)
Hemoglobin: 10.7 g/dL — ABNORMAL LOW (ref 12.0–15.0)
MCH: 22.2 pg — ABNORMAL LOW (ref 26.0–34.0)
MCHC: 29.2 g/dL — ABNORMAL LOW (ref 30.0–36.0)
MCV: 76.1 fL — ABNORMAL LOW (ref 80.0–100.0)
Platelets: 221 10*3/uL (ref 150–400)
RBC: 4.82 MIL/uL (ref 3.87–5.11)
RDW: 27.6 % — ABNORMAL HIGH (ref 11.5–15.5)
WBC: 6.7 10*3/uL (ref 4.0–10.5)
nRBC: 0 % (ref 0.0–0.2)

## 2022-07-10 LAB — GLUCOSE, CAPILLARY
Glucose-Capillary: 95 mg/dL (ref 70–99)
Glucose-Capillary: 129 mg/dL — ABNORMAL HIGH (ref 70–99)
Glucose-Capillary: 113 mg/dL — ABNORMAL HIGH (ref 70–99)
Glucose-Capillary: 157 mg/dL — ABNORMAL HIGH (ref 70–99)

## 2022-07-10 LAB — BASIC METABOLIC PANEL
Anion gap: 14 (ref 5–15)
BUN: 5 mg/dL — ABNORMAL LOW (ref 6–20)
CO2: 25 mmol/L (ref 22–32)
Calcium: 9.1 mg/dL (ref 8.9–10.3)
Chloride: 99 mmol/L (ref 98–111)
Creatinine, Ser: 0.58 mg/dL (ref 0.44–1.00)
GFR, Estimated: >60 mL/min (ref >60)
Glucose, Bld: 95 mg/dL (ref 70–99)
Potassium: 4.2 mmol/L (ref 3.5–5.1)
Sodium: 138 mmol/L (ref 135–145)

## 2022-07-10 MED ORDER — POTASSIUM CHLORIDE CRYS ER 10 MEQ PO TBCR
10.0000 meq | EXTENDED_RELEASE_TABLET | Freq: Two times a day (BID) | ORAL | Status: DC
  Administered 2022-07-10 – 2022-07-13 (×7): 10 meq via ORAL
  Filled 2022-07-10 (×7): qty 1

## 2022-07-10 MED ORDER — IPRATROPIUM BROMIDE HFA 17 MCG/ACT IN AERS: 2.0000 | INHALATION_SPRAY | Freq: Four times a day (QID) | RESPIRATORY_TRACT | Status: DC | PRN

## 2022-07-10 MED ORDER — IPRATROPIUM BROMIDE HFA 17 MCG/ACT IN AERS
2.0000 | INHALATION_SPRAY | Freq: Four times a day (QID) | RESPIRATORY_TRACT | Status: DC
  Filled 2022-07-10 (×2): qty 12.9

## 2022-07-10 MED ORDER — MOMETASONE FURO-FORMOTEROL FUM 100-5 MCG/ACT IN AERO
2.0000 | INHALATION_SPRAY | Freq: Two times a day (BID) | RESPIRATORY_TRACT | Status: DC
  Administered 2022-07-10 – 2022-07-13 (×5): 2 via RESPIRATORY_TRACT
  Filled 2022-07-10 (×2): qty 8.8

## 2022-07-10 MED ORDER — ACETAMINOPHEN 325 MG PO TABS: 325.0000 mg | ORAL_TABLET | ORAL | Status: AC | PRN

## 2022-07-10 MED ORDER — BUDESONIDE 0.25 MG/2ML IN SUSP
0.2500 mg | Freq: Two times a day (BID) | RESPIRATORY_TRACT | Status: DC
  Filled 2022-07-10: qty 2

## 2022-07-10 NOTE — Plan of Care (Signed)
  Problem: RH Swallowing Goal: LTG Patient will consume least restrictive diet using compensatory strategies with assistance (SLP) Description: LTG:  Patient will consume least restrictive diet using compensatory strategies with assistance (SLP) 07/10/2022 1233 by Charna Elizabeth T, CCC-SLP Outcome: Completed/Met 07/10/2022 0850 by Charna Elizabeth T, CCC-SLP Flowsheets (Taken 07/06/2022 1530) LTG: Pt Patient will consume least restrictive diet using compensatory strategies with assistance of (SLP): Modified Independent Goal: LTG Pt will demonstrate functional change in swallow as evidenced by bedside/clinical objective assessment (SLP) Description: LTG: Patient will demonstrate functional change in swallow as evidenced by bedside/clinical objective assessment (SLP) 07/10/2022 1233 by Charna Elizabeth T, CCC-SLP Outcome: Completed/Met 07/10/2022 0850 by Charna Elizabeth T, CCC-SLP Flowsheets (Taken 07/06/2022 1530) LTG: Patient will demonstrate functional change in swallow as evidenced by bedside/clinical objective assessment: Oropharyngeal swallow   Problem: RH Expression Communication Goal: LTG Patient will increase speech intelligibility (SLP) Description: LTG: Patient will increase speech intelligibility at word/phrase/conversation level with cues, % of the time (SLP) 07/10/2022 [id number] by Charna Elizabeth T, CCC-SLP Outcome: Completed/Met 07/10/2022 0850 by Charna Elizabeth T, CCC-SLP Flowsheets (Taken 07/06/2022 1530) LTG: Patient will increase speech intelligibility (SLP): Modified Independent   Problem: RH Pre-functional/Other (Specify) Goal: RH LTG SLP (Specify) 1 Description: RH LTG SLP (Specify) 1 07/10/2022 0852 by Charna Elizabeth T, CCC-SLP Outcome: Completed/Met 07/10/2022 0850 by Charna Elizabeth T, CCC-SLP Flowsheets (Taken 07/06/2022 1530) LTG: Other SLP (Specify) 1: Patient will independently recall vocal hygiene and breath support techniques with 100% accuracy

## 2022-07-10 NOTE — Consult Note (Signed)
Neuropsychological Consultation   Patient:   Desiree Stone   DOB:   June 19, 1975  MR Number:  [id number]  Location:  [place] [facility] [place] [id number] [place] [address] Dept: [place]: [phone]           Date of Service:   07/10/2022  Start Time:   9 AM End Time:   10 AM  Provider/Observer:  Ilean Skill, Psy.D.       Clinical Neuropsychologist       Billing Code/Service: [phone]  Reason for Service:    Desiree Stone is a 47 year old female referred for neuropsychological consultation as part of her overall care during her inpatient hospitalization on the comprehensive inpatient rehabilitation unit.  The patient has been referred for therapeutic interventions due to functional decline and debility.  Patient has a past medical history including hypertension, type 2 diabetes, super morbid obesity.  Patient had previously been on CPAP machine but complications with mask led her to discontinue it.  Patient was admitted to [facility] on 06/06/2022 with 3-week history of shortness of breath, productive cough with thick sputum and palpitations.  Patient progressed with difficulties leading to intubation.  Patient failed extubation on 1/20 and required reintubation the same day.  The patient dealt with significant fevers and infection along with agitation.  Patient with significant functional deficits following extended hospital stay and was admitted to CIR due to functional deficits and debility.   Below is the HPI for the current admission for convenience:  HPI: Desiree Stone is a 47 year old female with history of HTN, T2DM, super morbid obesity-BMI 38, who was admitted to [facility] on 06/06/22 with 3 week history of SOB, productive cough with thick sputum and palpitations. She she reported SOB/DOE for several months, orthopnea and sleeping in recliner for weeks as well as  LE edema.   Work up negative for viral illness.  2 D echo showed EF 60-65% with biatrial dilatation.  CTA chest showed cardiomegaly with enlarged main PA s/o PAH and refluxed contrast into IVC suggesting right heart dysfunction.  Her respiratory status declined due to persistent acidosis despite use of BIPAP, IV diureses and required intubation by 06/07/22. Dr. Lanney Gins felt that acute respiratory failure was in setting of hypercapnia with hypoxemia and possible CHF on arrival in setting of severe morbid obesity, suspected COPD (reported ongoing tobacco use--2-3 PPD) and deconditioning with likely sleep apnea.  She was not able to tolerate home CPAP previoulsy.  She was treated with IV diuresis and IV Solu-Medrol for COPD exacerbation w/progressive pleural effusion. Severe tracheal  secretions positive for gram positive cocci and she was treated with ceftriaxone for CAP.    She failed extubation attempts 01/20 required reintubation same day and noted to have severe upper air edema.  Leucocytosis felt to be due to steroids which were d/c on 01/27.  She developed low grade fever on 01/28 and CT chest revealing BLL consolidation with RML subsegmental atelectasis without effusions or obstruction and was treated with pulmonary hygiene. Dr. Tami Ribas consulted for trach placement on 01/29.  She continued to have intermittent fevers with agitation and cefepime added for VAP on 02/01. She was maintained on tube feeds for nutritional support and poorly controlled BS treated with IV insulin. She was weaned to  8 L oxygen per ATC  and ENT consulted to downsized trach to XLT #6 on 02/12. He recommends resuming CPAP prior to  decannulation.    She is tolerating PMSV trials and continues to have moderately hoarse voice with intermittent coughing --cough reported to be at baseline.  .   She reported LLQ tenderness w/mass 02/12 and ultrasound showed 3.4 cm complex cystic lesion --question developing abscess, hematoma w/ possible  superimposed infection or necrotic soft tissue mass. NGT got clogged and was removed. BSS done 02/13 and she was started on D1, thins with strict aspiration precautions. She reports her breathing is much improved. She denies pain. Lurline Idol continues to be uncomfortable at times. Therapy has been working with patient who continues to be limited by weakness, reports LLE numbness with prolonged sitting activity, fatigue with rest breaks and tachycardia with ambulating short distances. CIR recommended due to functional decline.   Current Status:  Patient was awake and alert as I entered the room sitting on the edge of her chair.  Patient reported that she had been using a CPAP in the past and it was working well and she felt better.  However, issues with proper mask size and other difficulties with CPAP and not being able to get the provider of the CPAP to address these led to her stopping using it.  Patient has had significant weight gain in the past as well which exacerbated after stopping CPAP use.  Patient has also been a regular smoker.  Patient reports that she quit smoking for 7 months with the goal of getting gastric bypass but was never able to not test positive for nicotine as she lives in a house with other smokers.  Patient reports that she is motivated to continue not smoking upon discharge if she has been an extended time without any cigarettes.  The patient is working on trying to get CPAP issues worked out and I suggested she take her mask on that she is using in the hospital on hospital supplied CPAP device.  Patient reports that she is sleeping very well with mask.  Patient oriented x 4 and reports that her mood has been positive as she is seeing functional gains.  Patient is motivated to continue with therapeutic interventions and hopes to continue to make gains.  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  Behavioral Observation: Desiree Stone  presents  as a 47 y.o.-year-old Right handed African American Female who appeared her stated age. her dress was Appropriate and she was Well Groomed and her manners were Appropriate to the situation.  her participation was indicative of Appropriate and Attentive behaviors.  There were physical disabilities noted.  she displayed an appropriate level of cooperation and motivation.    Interactions:    Active Appropriate  Attention:   within normal limits and attention span and concentration were age appropriate  Memory:   within normal limits; recent and remote memory intact  Visuo-spatial:  within normal limits  Speech (Volume):  normal  Speech:   normal; normal  Thought Process:  Coherent and Relevant  Though Content:  WNL; not suicidal and not homicidal  Orientation:   person, place, time/date, and situation  Judgment:   Good  Planning:   Fair  Affect:    Appropriate  Mood:    Anxious  Insight:   Good  Intelligence:   normal  Medical History:   Past Medical History:  Diagnosis Date   Asthma    Chronic back pain    Diabetes mellitus without complication ([place])    Hypertension  Iron deficiency anemia    Leiomyoma    s/p uterine artery emoblization   Sleep apnea    Tobacco use disorder          Patient Active Problem List   Diagnosis Date Noted   Difficulty coping 07/10/2022   Debility 07/05/2022   Acute respiratory failure ([place]) 06/07/2022   Acute respiratory failure with hypoxia and hypercapnia ([facility]) 06/06/2022   Type 2 diabetes mellitus ([place]) 06/06/2022   Asthma 06/06/2022   Essential hypertension 03/30/2020   Obstructive sleep apnea 06/14/2016   Smoking 06/14/2016   Class 3 obesity in adult 04/12/2016   Insomnia disorder 04/12/2016   Family history of polyps in the colon 11/12/2014     Psychiatric History:  No prior psychiatric history  Family Med/Psych History:  Family History  Problem Relation Age of Onset   Breast cancer Paternal Aunt 61   Cancer  Paternal Grandmother        Colon   Impression/DX:  Desiree Stone is a 47 year old female referred for neuropsychological consultation as part of her overall care during her inpatient hospitalization on the comprehensive inpatient rehabilitation unit.  The patient has been referred for therapeutic interventions due to functional decline and debility.  Patient has a past medical history including hypertension, type 2 diabetes, super morbid obesity.  Patient had previously been on CPAP machine but complications with mask led her to discontinue it.  Patient was admitted to [facility] on 06/06/2022 with 3-week history of shortness of breath, productive cough with thick sputum and palpitations.  Patient progressed with difficulties leading to intubation.  Patient failed extubation on 1/20 and required reintubation the same day.  The patient dealt with significant fevers and infection along with agitation.  Patient with significant functional deficits following extended hospital stay and was admitted to CIR due to functional deficits and debility.  Disposition/Plan:  Today we worked on coping and adjustment issues with extended hospital stay after admission to the hospital on 06/06/2022.  Diagnosis:    Debility        Electronically Signed   _______________________ Ilean Skill, Psy.D. Clinical Neuropsychologist

## 2022-07-10 NOTE — Discharge Instructions (Signed)
Inpatient Rehab Discharge Instructions  NYALISE BANUELOS Discharge date and time:  07/13/22  Activities/Precautions/ Functional Status: Activity: no lifting, driving, or strenuous exercise till cleared by MD Diet: cardiac diet and diabetic diet Wound Care: keep wound clean and dry   Functional status:  ___ No restrictions     ___ Walk up steps independently ___ 24/7 supervision/assistance   ___ Walk up steps with assistance _X__ Intermittent supervision/assistance  ___ Bathe/dress independently ___ Walk with walker     ___ Bathe/dress with assistance ___ Walk Independently    _X__ Shower independently ___ Walk with assistance    ___ Shower with assistance _X__ No alcohol     ___ Return to work/school ________  COMMUNITY REFERRALS UPON DISCHARGE:     Medical Equipment/Items Ordered: Single [employer]                                                 Agency/Supplier: [id number] [phone]   Special Instructions:    My questions have been answered and I understand these instructions. I will adhere to these goals and the provided educational materials after my discharge from the hospital.  Patient/Caregiver Signature _______________________________ Date __________  Clinician Signature _______________________________________ Date __________  Please bring this form and your medication list with you to all your follow-up doctor's appointments.

## 2022-07-10 NOTE — Progress Notes (Signed)
Speech Language Pathology Discharge Summary  Patient Details  Name: Desiree Stone MRN: [id number] Date of Birth: Mar 28, 1976  Date of Discharge from SLP service:July 10, 2022  Patient has met 4 of 4 long term goals.  Patient to discharge at overall Modified Independent level.  Reasons goals not met: N/A   Clinical Impression/Discharge Summary: Patient has made excellent gains and has met 4 of 4 long-term goals this admission due to improved oropharyngeal swallow function and speech/voice. Pt is currently consuming a regular consistency diet with thin liquids with mod I for implementation of swallowing compensatory strategies. Pt's trach was decannulated 07/07/22 and pt is implementing speech/voice strategies for improved vocal intensity with mod I. Pt's vocal quality remains mild-to-moderately hoarse and nearing baseline per pt report, as pt endorsed hoarse vocal quality at baseline. Would recommend ENT f/u as outpatient if there are any further concerns with voice at discharge. Hoarseness and reduced vocal intensity does not currently impact pt's ability to verbally communicate functional needs. Education is complete, handouts provided, and pt's care partner is independent to provide the necessary physical at discharge. F/u SLP services do not appear clinically indicated at this time.   Care Partner:  Caregiver Able to Provide Assistance: Yes  Type of Caregiver Assistance: Physical  Recommendation:  None      Equipment: None   Reasons for discharge: Treatment goals met   Patient/Family Agrees with Progress Made and Goals Achieved: Yes    Patty Sermons 07/10/2022, 12:39 PM

## 2022-07-10 NOTE — Progress Notes (Signed)
PROGRESS NOTE   Subjective/Complaints:   Feels ok, states that sats have been ~95% off O2   Discussed smoking cessation former 2-3 ppd smoker, son  and brother smoke at home, pt's mom asked that they smoke outdoors   ROS: No  CP, SOB, increasing swelling, cough, abd pain, n/v/d/c  Objective:   No results found. No results for input(s): "WBC", "HGB", "HCT", "PLT" in the last 72 hours.  Recent Labs    07/10/22 0624  NA 138  K 4.2  CL 99  CO2 25  GLUCOSE 95  BUN 5*  CREATININE 0.58  CALCIUM 9.1     Intake/Output Summary (Last 24 hours) at 07/10/2022 0853 Last data filed at 07/09/2022 1745 Gross per 24 hour  Intake 832 ml  Output --  Net 832 ml         Physical Exam: Vital Signs Blood pressure (!) 159/94, pulse 99, temperature 98.2 F (36.8 C), resp. rate 18, height 4' 11"$  (1.499 m), weight 134.5 kg, SpO2 92 %.  General: No acute distress, sitting on edge of bed doing word puzzles Mood and affect are appropriate ENT- trach decannulated, area covered with dressing. MMM. Slightly raspy voice but strong phonation Heart: Regular rate and rhythm no rubs murmurs or extra sounds Lungs: Clear to auscultation, breathing unlabored, no rales or wheezes, Abdomen: Positive bowel sounds, soft nontender to palpation, nondistended Extremities: No clubbing, cyanosis, or edema Skin: No evidence of breakdown, no evidence of rash  Musculoskeletal: Full range of motion in all 4 extremities. No joint swelling   Assessment/Plan: 1. Functional deficits which require 3+ hours per day of interdisciplinary therapy in a comprehensive inpatient rehab setting. Physiatrist is providing close team supervision and 24 hour management of active medical problems listed below. Physiatrist and rehab team continue to assess barriers to discharge/monitor patient progress toward functional and medical goals  Care Tool:  Bathing    Body  parts bathed by patient: Right arm, Left arm, Chest, Abdomen, Front perineal area, Right upper leg, Left upper leg, Face   Body parts bathed by helper: Buttocks, Right lower leg, Left lower leg     Bathing assist Assist Level: Moderate Assistance - Patient 50 - 74%     Upper Body Dressing/Undressing Upper body dressing   What is the patient wearing?: Pull over shirt    Upper body assist Assist Level: Supervision/Verbal cueing    Lower Body Dressing/Undressing Lower body dressing      What is the patient wearing?: Pants     Lower body assist Assist for lower body dressing: Maximal Assistance - Patient 25 - 49%     Toileting Toileting    Toileting assist Assist for toileting: Maximal Assistance - Patient 25 - 49%     Transfers Chair/bed transfer  Transfers assist     Chair/bed transfer assist level: Minimal Assistance - Patient > 75%     Locomotion Ambulation   Ambulation assist      Assist level: Contact Guard/Touching assist Assistive device: Walker-rolling Max distance: 41f   Walk 10 feet activity   Assist     Assist level: Minimal Assistance - Patient > 75% Assistive device: Walker-rolling   Walk  50 feet activity   Assist Walk 50 feet with 2 turns activity did not occur: Safety/medical concerns         Walk 150 feet activity   Assist Walk 150 feet activity did not occur: Safety/medical concerns         Walk 10 feet on uneven surface  activity   Assist     Assist level: Contact Guard/Touching assist Assistive device:  Arts development officer)   Wheelchair     Assist Is the patient using a wheelchair?: Yes Type of Wheelchair: Manual    Wheelchair assist level: Contact Guard/Touching assist Max wheelchair distance: 62f    Wheelchair 50 feet with 2 turns activity    Assist        Assist Level: Minimal Assistance - Patient > 75%   Wheelchair 150 feet activity     Assist      Assist Level: Maximal Assistance -  Patient 25 - 49%   Blood pressure (!) 159/94, pulse 99, temperature 98.2 F (36.8 C), resp. rate 18, height 4' 11"$  (1.499 m), weight 134.5 kg, SpO2 92 %.  Medical Problem List and Plan: 1. Functional deficits secondary to Debility due to respiratory failure, CHF, COPD , Pneumonia              -patient may  shower             -ELOS/Goals: 8-10 days, PT and OT sup to mod I, SLP mod I, should be this week, hope to wean off O2 prior to d/c             -Cont CIR, PT/OT/SLP  2.  Antithrombotics: -DVT/anticoagulation:  Pharmaceutical: Lovenox 667mQD, may d/c if amb >50' at a time with PT              -antiplatelet therapy: N/A 3. Pain Management: Oxycodone 1076m6h prn.  4. Mood/Behavior/Sleep: LCSW to follow for evaluation and support. Zoloft 82m19mily             -antipsychotic agents: N/A             --Melatonin 5mg 25m prn insomnia. Prn Trazodone  -Hydroxyzine 10mg 5mPRN for itching/anxiety 5. Neuropsych/cognition: This patient is capable of making decisions on his own behalf. 6. Skin/Wound Care: Routine pressure relief measures.  -add juven for nutritional support--not added, weekday team to assess if this is still needed 7. Fluids/Electrolytes/Nutrition:  Monitor I/O. Monitor weekly labs starting 07/10/22 -cont multivitamins 8.Hypercarbic respiratory failure s/p trach:             -Supplemental O2 to maintain O2 sats above 88%  -07/08/22 trach decannulated on 07/07/22, doing well, monitor 9. COPD: Budesonide nebs d/c 02/09 and on duo nebs BID+q4h PRN now. 10. Congestive heart failure: Strict I/O--lasix decreased to 40 mg/daily on 02/13. Daily weight.             --weight at admission 300 lbs-->discharge wt 284 lbs (129.1kg 07/07/22)             --Monitor  for signs of overload.  -07/08/22 weight up a bit, no edema noted, monitor for now -07/09/22 wt up again but suspect it's because it's a bed weight; standing weights done while hospitalized; had standing weight now and is 130.7kg which  is much more stable from prior; cont to monitor. No signs of fluid overload.  Filed Weights   07/09/22 0507 07/09/22 0926 07/10/22 0313  Weight: (!) 138.1 kg 130.7 kg 134.5 kg    11.  T2DM: hgb A1c-9.0 and poorly controlled.  --continue Insulin Glargine 30 units BID w/SSI meal coverage, 7 units novolog mealtime TID -07/09/22 well controlled, monitor CBG (last 3)  Recent Labs    07/09/22 1706 07/09/22 2048 07/10/22 0551  GLUCAP 111* 159* 95    12. Hypokalemia: Has been supplemented intermittently -07/08/22 K+ 3.3 on 2/15, cont Klor 53mq BID,     Latest Ref Rng & Units 07/10/2022    6:24 AM 07/06/2022    6:48 AM 07/03/2022    3:54 AM  BMP  Glucose 70 - 99 mg/dL 95  117  167   BUN 6 - 20 mg/dL 5  6  24   $ Creatinine 0.44 - 1.00 mg/dL 0.58  0.48  0.50   Sodium 135 - 145 mmol/L 138  134  139   Potassium 3.5 - 5.1 mmol/L 4.2  3.3  3.4   Chloride 98 - 111 mmol/L 99  95  99   CO2 22 - 32 mmol/L 25  28  30   $ Calcium 8.9 - 10.3 mg/dL 9.1  9.1  9.1    K+ normalized , now off KCL but still on Lasix will recheck prior to discharge  13. Leucocytosis: resolved    Latest Ref Rng & Units 07/10/2022    6:24 AM 07/06/2022    6:48 AM 07/03/2022    3:54 AM  CBC  WBC 4.0 - 10.5 K/uL 6.7  8.6  12.7   Hemoglobin 12.0 - 15.0 g/dL 10.7  11.2  11.2   Hematocrit 36.0 - 46.0 % 36.7  39.8  41.3   Platelets 150 - 400 K/uL 221  255  333     14. Iron deficiency anemia: Hgb stable  15.  Depression: Zoloft 531mdaily 16. Dysphagia, Dys 1/thin diet             -Continue SLP 17. Dyslipidemia: Total 134/LDL 83/Trig 1641 (highest) in 05/2022; continue Lipitor 407mD    LOS: 5 days A FACE TO FACE EVALUATION WAS PERFORMED  AndCharlett Blake19/2024, 8:53 AM

## 2022-07-10 NOTE — Progress Notes (Signed)
Speech Language Pathology Daily Session Note  Patient Details  Name: Desiree Stone MRN: [id number] Date of Birth: 04-12-1976  Today's Date: 07/10/2022 SLP Individual Time: [id number] and 1200-1215 SLP Individual Time Calculation (min): 60 min  Short Term Goals: Week 1: SLP Short Term Goal 1 (Week 1): Patient will donn/doff PMSV with independence across 2 consecutive sessions SLP Short Term Goal 2 (Week 1): Patient will implement vocal intensity/breath support techniques with sup A verbal cues SLP Short Term Goal 3 (Week 1): Patient will independently recall and verbalize vocal hygiene considerations following education SLP Short Term Goal 4 (Week 1): Patient will tolerate current diet with minimal overt or subtle s/sx of aspiration and with sup A verbal cues for implementation of swallowing compensatory strategies  Skilled Therapeutic Interventions:  Session 1 0800-0845: Skilled ST treatment focused on voice goals. Pt was in bathroom upon arrival with nurse present . Pt performed peri care and washed hands independently. Pt ambulated to EOB with RW with sup A. Pt's trach was decannulated 2/16. Pt is currently on 2L of O2 via nasal cannula. Pt's voice still presenting mild-to-moderately hoarse with reduced vocal intensity. Pt reported vocal quality is getting closer to baseline. Pt exhibited excellent insight into deficits and anticipatory awareness of needs upon discharge. Pt reported her mother plans to stay with her for awhile. Pt independently recalled vocal hygiene and breath support techniques with 100% accuracy. Pt implemented all techniques at the Mod I level. Despite decreased vocal intensity, pt continues to be fully intelligible at the conversational level. Speech/voice goals met. Pt was observed consuming whole pills with thin liquid with no overt s/sx of aspiration. Pt agreeable to regular trials, however mentioned her blood sugar increased yesterday with a snack and preferred to defer  trials until lunch time. Plan to assess tolerance of regular consistencies during noon meal and likely s/o. Patient was left in bed with alarm activated and immediate needs within reach at end of session. Continue per current plan of care.      Session 2: Pt seen for consumption of therapeutic PO trials with regular textures and thin liquids. Pt exhibited thorough mastication of solids, swift swallow response, no s/s of aspiration. Recommend advancing diet to regular textures, thin liquids; meds whole in liquid. No further dysphagia. As mentioned above, speech/voice goals met. SLP services will sign off.   Pain  None/denied  Therapy/Group: Individual Therapy  Patty Sermons 07/10/2022, 8:11 AM

## 2022-07-10 NOTE — Plan of Care (Signed)
  Problem: RH Expression Communication Goal: LTG Patient will increase speech intelligibility (SLP) Description: LTG: Patient will increase speech intelligibility at word/phrase/conversation level with cues, % of the time (SLP) 07/10/2022 0852 by Charna Elizabeth T, CCC-SLP Outcome: Completed/Met 07/10/2022 0850 by Charna Elizabeth T, CCC-SLP Flowsheets (Taken 07/06/2022 1530) LTG: Patient will increase speech intelligibility (SLP): Modified Independent   Problem: RH Pre-functional/Other (Specify) Goal: RH LTG SLP (Specify) 1 Description: RH LTG SLP (Specify) 1 07/10/2022 0852 by Charna Elizabeth T, CCC-SLP Outcome: Completed/Met 07/10/2022 0850 by Charna Elizabeth T, CCC-SLP Flowsheets (Taken 07/06/2022 1530) LTG: Other SLP (Specify) 1: Patient will independently recall vocal hygiene and breath support techniques with 100% accuracy

## 2022-07-10 NOTE — Progress Notes (Signed)
Occupational Therapy Session Note  Patient Details  Name: Desiree Stone MRN: [id number] Date of Birth: [birth year]  Today's Date: 07/10/2022 OT Individual Time: 1102-1200 OT Individual Time Calculation (min): 58 min    Short Term Goals: Week 1:  OT Short Term Goal 1 (Week 1): Pt will thread BL Einto pants with AE PRN OT Short Term Goal 2 (Week 1): Pt will complete oral care in standing with no change in vitals to demo improved endurance OT Short Term Goal 3 (Week 1): Pt will complete 1/3 steps of toileting OT Short Term Goal 4 (Week 1): Pt will transfer to toilet with S and LRAD  Skilled Therapeutic Interventions/Progress Updates:  Pt greeted seated EOB, pt agreeable to OT intervention. Session focus on BADL reeducation, functional mobility, dynamic standing balance, standing tolerance, BUE strength/endurance and decreasing overall caregiver burden.       Pt requested to work on completing bath at sink from standing today. Pt completed wash up from standing with overall MIN A needing assist to reach bottom but really d/t body habitus. Education provided on using LH sponge to extend reach.   Pt completed dressing from sitting with set- up assist for UB dressing and MOD A for LB dressing but mostly d/t tight leggings. Pt transported in w/c to ADL apt with pt able to demo ability to step over walkin shower threshold with SPC with CGA. Pt reports a shower seat will not fit into her shower. Pt hopeful that she has enough standing tolerance to complete shower from standing by DC.   Therefore remainder of session focused on functional endurance pt given various standing therapeutic activities to complete with pt able to stand for 2 mins and 36 secs at most before needing to sit. Pt also completed below BUE therex with 2 lb dowel rod to facilitate improved overall functional endurance:  X20 bicep curls X20 chest presses X20 standing rows forward/backwards  Pt completed functional ambulation  back to room with RW and close supervision with chair follow.                 Ended session with pt seated EOM with all needs within reach and NT present.            Pt on RA during session with SpO2 dropping to lowest 88% but able to reobound to >90% in less than 30 secs, HR max 120 bpm.   Therapy Documentation Precautions:  Precautions Precautions: Fall Precaution Comments: watch O2 keep >90% Restrictions Weight Bearing Restrictions: No   Pain: No pain    Therapy/Group: Individual Therapy  Corinne Ports [facility] 07/10/2022, 12:20 PM

## 2022-07-10 NOTE — Progress Notes (Signed)
Physical Therapy Session Note  Patient Details  Name: Desiree Stone MRN: [id number] Date of Birth: 18-Nov-1975  Today's Date: 07/10/2022 PT Individual Time: 1347-1510 PT Individual Time Calculation (min): 83 min   Short Term Goals: Week 1:  PT Short Term Goal 1 (Week 1): Pt w// ambulate 100 ft w/ CGA PT Short Term Goal 2 (Week 1): Pt will decrease TUG by > 5 seconds PT Short Term Goal 3 (Week 1): Pt will perform transfers Mod I  Skilled Therapeutic Interventions/Progress Updates:    Pt received sitting upright at EOB and agreeable to physical therapy. Pt reports that L foot remains numb at rest and tingles when wb and now reports that entire L limb feels pain when she's on her feet for an extended amount of time. Pt expresses mild pain at start of session and is eager to get better. Pt transported to therapy gym dependently and SpO2 at start of session read 96%.  Transfers: Sit to stand transfers throughout session were supervision A using SPC or WC armrests. Pt requires no vc. Stand pivot transfers were performed w/ supervision A. Pt mod I w/ bed mobility to sit at EOB.   Gait training: ~100 ft using SPC and supervision A. Pt demonstrated a wide BOS w/ mod antalgic gait. States she doesn't want to put too much wt through LLE. SpO2 checked after read 91%. Pt performed pursed-lipped breathing and O2 increased to 100%. Pt performed 140 ft of gait w/ SPC and supervision. Pt cued to slow down to prevent LOB and lift knees up to facilitate BLE foot clearance. Pt was able to demonstrate this, however, become less when fatigue began. SpO2 checked after read 91%. After resting and performing pursed-lipped breathing O2 increased to 98% after about 2 minutes. ~50 ft of gait w/o AD and CGA back to pts room. Pt cued to decreased speed to ensure safety. Pt expressed joy with how far she walked w/o AD.   Step up onto 6" step 2x5 leading with each foot. Pt using BHR and supervision assist. Stepping up w/ L  foot seemingly more difficult than R. Single limb balance on airex pad w/ contralateral foot tap on 6" step 2x10. Pt instructed to keep most of her weight on stance leg. Side steps 2x30 ft w/ no AD and supervision A. Pt demonstrated fatigue during last set which was noted by mod left foot drag when adducting. Vc to decrease speed to allow for more control and coordination required for pt safety.   Pt transported back to room and used the restroom w/ distant supervision and was independent w/ pericare. Pt left sitting at EOB w/ bed alarm on, call bell in reach and all needs met.    Therapy Documentation Precautions:  Precautions Precautions: Fall Precaution Comments: watch O2 keep >90% Restrictions Weight Bearing Restrictions: No     Therapy/Group: Individual Therapy  Corene Resnick 07/10/2022, 5:04 PM

## 2022-07-10 NOTE — Plan of Care (Signed)
  Problem: RH Swallowing Goal: LTG Patient will consume least restrictive diet using compensatory strategies with assistance (SLP) Description: LTG:  Patient will consume least restrictive diet using compensatory strategies with assistance (SLP) Flowsheets (Taken 07/06/2022 1530) LTG: Pt Patient will consume least restrictive diet using compensatory strategies with assistance of (SLP): Modified Independent Goal: LTG Pt will demonstrate functional change in swallow as evidenced by bedside/clinical objective assessment (SLP) Description: LTG: Patient will demonstrate functional change in swallow as evidenced by bedside/clinical objective assessment (SLP) Flowsheets (Taken 07/06/2022 1530) LTG: Patient will demonstrate functional change in swallow as evidenced by bedside/clinical objective assessment: Oropharyngeal swallow   Problem: RH Expression Communication Goal: LTG Patient will increase speech intelligibility (SLP) Description: LTG: Patient will increase speech intelligibility at word/phrase/conversation level with cues, % of the time (SLP) Flowsheets (Taken 07/06/2022 1530) LTG: Patient will increase speech intelligibility (SLP): Modified Independent   Problem: RH Pre-functional/Other (Specify) Goal: RH LTG SLP (Specify) 1 Description: RH LTG SLP (Specify) 1 Flowsheets (Taken 07/06/2022 1530) LTG: Other SLP (Specify) 1: Patient will independently recall vocal hygiene and breath support techniques with 100% accuracy

## 2022-07-11 LAB — GLUCOSE, CAPILLARY
Glucose-Capillary: 124 mg/dL — ABNORMAL HIGH (ref 70–99)
Glucose-Capillary: 108 mg/dL — ABNORMAL HIGH (ref 70–99)
Glucose-Capillary: 122 mg/dL — ABNORMAL HIGH (ref 70–99)

## 2022-07-11 NOTE — Progress Notes (Signed)
PROGRESS NOTE   Subjective/Complaints:  Appreciate neuropsych consult   ROS: No  CP, SOB, increasing swelling, cough, abd pain, n/v/d/c  Objective:   No results found. Recent Labs    07/10/22 0624  WBC 6.7  HGB 10.7*  HCT 36.7  PLT 221    Recent Labs    07/10/22 0624  NA 138  K 4.2  CL 99  CO2 25  GLUCOSE 95  BUN 5*  CREATININE 0.58  CALCIUM 9.1      Intake/Output Summary (Last 24 hours) at 07/11/2022 0836 Last data filed at 07/11/2022 0753 Gross per 24 hour  Intake 716 ml  Output --  Net 716 ml         Physical Exam: Vital Signs Blood pressure 120/76, pulse 97, temperature 97.7 F (36.5 C), temperature source Axillary, resp. rate 18, height 4' 11"$  (1.499 m), weight 134.5 kg, SpO2 100 %.  General: No acute distress, sitting on edge of bed doing word puzzles Mood and affect are appropriate ENT- trach decannulated, area covered with dressing. No air leak Heart: Regular rate and rhythm no rubs murmurs or extra sounds Lungs: Clear to auscultation, breathing unlabored, no rales or wheezes, Abdomen: Positive bowel sounds, soft nontender to palpation, nondistended Extremities: No clubbing, cyanosis, or edema Skin: No evidence of breakdown, no evidence of rash  Musculoskeletal: Full range of motion in all 4 extremities. No joint swelling   Assessment/Plan: 1. Functional deficits which require 3+ hours per day of interdisciplinary therapy in a comprehensive inpatient rehab setting. Physiatrist is providing close team supervision and 24 hour management of active medical problems listed below. Physiatrist and rehab team continue to assess barriers to discharge/monitor patient progress toward functional and medical goals  Care Tool:  Bathing    Body parts bathed by patient: Right arm, Left arm, Chest, Abdomen, Front perineal area, Right upper leg, Left upper leg, Face   Body parts bathed by helper:  Buttocks     Bathing assist Assist Level: Minimal Assistance - Patient > 75%     Upper Body Dressing/Undressing Upper body dressing   What is the patient wearing?: Pull over shirt    Upper body assist Assist Level: Set up assist    Lower Body Dressing/Undressing Lower body dressing      What is the patient wearing?: Pants     Lower body assist Assist for lower body dressing: Moderate Assistance - Patient 50 - 74%     Toileting Toileting    Toileting assist Assist for toileting: Maximal Assistance - Patient 25 - 49%     Transfers Chair/bed transfer  Transfers assist     Chair/bed transfer assist level: Contact Guard/Touching assist     Locomotion Ambulation   Ambulation assist      Assist level: Contact Guard/Touching assist Assistive device: Walker-rolling Max distance: 16f   Walk 10 feet activity   Assist     Assist level: Minimal Assistance - Patient > 75% Assistive device: Walker-rolling   Walk 50 feet activity   Assist Walk 50 feet with 2 turns activity did not occur: Safety/medical concerns         Walk 150 feet activity   Assist  Walk 150 feet activity did not occur: Safety/medical concerns         Walk 10 feet on uneven surface  activity   Assist     Assist level: Contact Guard/Touching assist Assistive device:  Arts development officer)   Wheelchair     Assist Is the patient using a wheelchair?: Yes Type of Wheelchair: Manual    Wheelchair assist level: Contact Guard/Touching assist Max wheelchair distance: 63f    Wheelchair 50 feet with 2 turns activity    Assist        Assist Level: Minimal Assistance - Patient > 75%   Wheelchair 150 feet activity     Assist      Assist Level: Maximal Assistance - Patient 25 - 49%   Blood pressure 120/76, pulse 97, temperature 97.7 F (36.5 C), temperature source Axillary, resp. rate 18, height 4' 11"$  (1.499 m), weight 134.5 kg, SpO2 100 %.  Medical Problem List  and Plan: 1. Functional deficits secondary to Debility due to respiratory failure, CHF, COPD , Pneumonia              -patient may  shower             -ELOS/Goals: 8-10 days, PT and OT sup to mod I, SLP mod I, should be this week, hope to wean off O2 prior to d/c             -Cont CIR, PT/OT/SLP  2.  Antithrombotics: -DVT/anticoagulation:  Pharmaceutical: Lovenox 644mQD, may d/c if amb >50' at a time with PT              -antiplatelet therapy: N/A 3. Pain Management: Oxycodone 1042m6h prn.  4. Mood/Behavior/Sleep: LCSW to follow for evaluation and support. Zoloft 60m6mily             -antipsychotic agents: N/A             --Melatonin 5mg 27m prn insomnia. Prn Trazodone  -Hydroxyzine 10mg 61mPRN for itching/anxiety 5. Neuropsych/cognition: This patient is capable of making decisions on his own behalf. 6. Skin/Wound Care: Routine pressure relief measures.  -add juven for nutritional support--not added, weekday team to assess if this is still needed 7. Fluids/Electrolytes/Nutrition:  Monitor I/O. Monitor weekly labs starting 07/10/22 -cont multivitamins 8.Hypercarbic respiratory failure s/p trach:             -Supplemental O2 to maintain O2 sats above 88%  -07/08/22 trach decannulated on 07/07/22, doing well, monitor 9. COPD: Budesonide nebs d/c 02/09 and on duo nebs BID+q4h PRN now. 10. Congestive heart failure: Strict I/O--lasix decreased to 40 mg/daily on 02/13. Daily weight.             --weight at admission 300 lbs-->discharge wt 284 lbs (129.1kg 07/07/22)             --Monitor  for signs of overload.  -07/08/22 weight up a bit, no edema noted, monitor for now -07/09/22 wt up again but suspect it's because it's a bed weight; standing weights done while hospitalized; had standing weight now and is 130.7kg which is much more stable from prior; cont to monitor. No signs of fluid overload.  Filed Weights   07/09/22 0507 07/09/22 0926 07/10/22 0313  Weight: (!) 138.1 kg 130.7 kg 134.5 kg     11. T2DM: hgb A1c-9.0 and poorly controlled.  --continue Insulin Glargine 30 units BID w/SSI meal coverage, 7 units novolog mealtime TID -07/11/22 well controlled, monitor CBG (last 3)  Recent Labs  07/10/22 1159 07/10/22 1632 07/10/22 2102  GLUCAP 129* 113* 157*    12. Hypokalemia: Has been supplemented intermittently     Latest Ref Rng & Units 07/10/2022    6:24 AM 07/06/2022    6:48 AM 07/03/2022    3:54 AM  BMP  Glucose 70 - 99 mg/dL 95  117  167   BUN 6 - 20 mg/dL 5  6  24   $ Creatinine 0.44 - 1.00 mg/dL 0.58  0.48  0.50   Sodium 135 - 145 mmol/L 138  134  139   Potassium 3.5 - 5.1 mmol/L 4.2  3.3  3.4   Chloride 98 - 111 mmol/L 99  95  99   CO2 22 - 32 mmol/L 25  28  30   $ Calcium 8.9 - 10.3 mg/dL 9.1  9.1  9.1    K+ normalized , now off KCL but still on Lasix will recheck in am to determine if daily supplement is needed  13. Leucocytosis: resolved    Latest Ref Rng & Units 07/10/2022    6:24 AM 07/06/2022    6:48 AM 07/03/2022    3:54 AM  CBC  WBC 4.0 - 10.5 K/uL 6.7  8.6  12.7   Hemoglobin 12.0 - 15.0 g/dL 10.7  11.2  11.2   Hematocrit 36.0 - 46.0 % 36.7  39.8  41.3   Platelets 150 - 400 K/uL 221  255  333     14. Iron deficiency anemia: Hgb stable  15.  Depression: Zoloft 74m daily 16. Dysphagia, Dys 1/thin diet             -Continue SLP 17. Dyslipidemia: Total 134/LDL 83/Trig 1641 (highest) in 05/2022; continue Lipitor 476mQD    LOS: 6 days A FACE TO FACE EVALUATION WAS PERFORMED  AnCharlett Blake/20/2024, 8:36 AM

## 2022-07-11 NOTE — Progress Notes (Signed)
Occupational Therapy Session Note  Patient Details  Name: Desiree Stone MRN: [id number] Date of Birth: 06-28-1975  Today's Date: 07/11/2022 OT Individual Time: 1132-1202 and 1345-1455 OT Individual Time Calculation (min): 30 min and 70 min   Short Term Goals: Week 1:  OT Short Term Goal 1 (Week 1): Pt will thread BLE into pants with AE PRN OT Short Term Goal 2 (Week 1): Pt will complete oral care in standing with no change in vitals to demo improved endurance OT Short Term Goal 3 (Week 1): Pt will complete 1/3 steps of toileting OT Short Term Goal 4 (Week 1): Pt will transfer to toilet with S and LRAD  Skilled Therapeutic Interventions/Progress Updates:    AM Session:  Patient received seated on edge of bed.  Patient able to clearly articulate goals and needs.  Discussed that likely she would be restricted from driving for a period of time (to be determined by MD) post discharge.  Discussed physical demands of getting up, ready , and working in a [employer] - full time.  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  Patient reports improved ability to work with arms overhead - eg wrapping head in a scarf- demonstrated while seated.  Patient excited with her improvement and eager to return home.  Patient very clear in no desire to return to smoking.  Patient left seated on edge of bed awaiting lunch.   PM Session:  Patient received seated edge of bed - eager for shower.  Patient able to walk to dresser and collect clothing, carry clothing draped over walker to shower.  Worked on negotiating tight spaces with walker.  Dropped height of shower bench.  Showered without physical assistance - used reacher to help remove LB clothing. Patient able to dress herself with increased time, seated, and with intermittent cueing for technique, or to slow down/ reset.  Encouraged  rest breaks throughout with breathing to reduce HR and increase O2 saturation.  Walked to therapy gym and back with one seated rest break.  Educating patient regarding her recovery, timelines for return to prior activity level, and to inquire with MD about driving as this was a very large part of her social life.   Cueing throughout session for patient to pace her activity.  Patient moves quickly, and becomes short of breath with Increased heart rate.  Patient maintained O2 level between 94-99% this session, with HR ranging from 128-111.    Therapy Documentation Precautions:  Precautions Precautions: Fall Precaution Comments: watch O2 keep >90% Restrictions Weight Bearing Restrictions: No   Pain: No report of pain     Therapy/Group: Individual Therapy  Mariah Milling 07/11/2022, 12:50 PM

## 2022-07-11 NOTE — Progress Notes (Signed)
Physical Therapy Session Note  Patient Details  Name: Desiree Stone MRN: [id number] Date of Birth: 11-07-1975  Today's Date: 07/11/2022 PT Individual Time: 0846-1000 PT Individual Time Calculation (min): 74 min   Short Term Goals: Week 1:  PT Short Term Goal 1 (Week 1): Pt w// ambulate 100 ft w/ CGA PT Short Term Goal 2 (Week 1): Pt will decrease TUG by > 5 seconds PT Short Term Goal 3 (Week 1): Pt will perform transfers Mod I  Skilled Therapeutic Interventions/Progress Updates:    Pt received sitting upright at EOB and agreeable to physical therapy services. Pt expressed that after last session she was in pain and required pain medication. Explained that she had a long day yesterday w/ PT and OT. Denies any current pain.   Transfers: Sit to stand transfers performed session were supervision A. Step pivot was supervision assist. Pt cued to lift knees up to avoid dragging LLE.   Gait training: ~120f w/ SPC and supervision A. Pt demonstrated a lateral stepping pattern and wide BOS. Pt's SpO2 checked in sitting after gait read 94%. Performed 10 ft of gait w/ SPC and supervision A [facility] to lift knees and focus on a heel toe pattern. Pt was able to demonstrate understanding and achieved a more functional gait.   Stepping wt shifts over yoga block 2x10 B w/ cues to lift knees up emphasizing moving along the sagittal plane. Pt performed exercise w/in RW for balance. Pt demonstrating increase RR. SpO2 checked read 98%.  Ascedning/descending laterally on 4 stairs x2 to work on B hip and BLE strengthening. Pt using HR and supervision A given for safety. Vc to avoid dragging L foot when descedning was effective w/ pt demonstrating the ability to lift LLE up and clear step. Performed chair squats 2x8 using armrests to help w/ power up and supervision. Cueing for pt to hinge at hips as if hovering over a toilet sit was effective in achieving improved form. Pt expressed that the squats  increased the numbness and tingling down LLE. Standing marches x1 min w/ RW for stability and supervision A. emphasis on getting pt's knee's up. Pt Mod I for transferring to side lying on mat to perform straight leg hip abduction 2x10 w/ therapist facilitating anterior rotation pf pelvis to assist in proper form for glute med activation. Supine glute bridge 2x10. Pt instructed on posterior pelvic tilt to achieve TA activation and instructed to engage glutes to lift hips off the table. By the end of session pt was experiencing moderate LLE discomfort. Pt transported back to room dependently and left sitting at EOB w/ bed alarm on, call bell in reach and all needs met.    Therapy Documentation Precautions:  Precautions Precautions: Fall Precaution Comments: watch O2 keep >90% Restrictions Weight Bearing Restrictions: No General:    Pain: Pain Assessment Pain Scale: 0-10 Pain Score: 0-No pain      Therapy/Group: Individual Therapy  Eesha Schmaltz 07/11/2022, 9:50 AM

## 2022-07-11 NOTE — Progress Notes (Signed)
Pt has  a CPAP set up and ready at bedside, pt stated she will place on herself when ready.  

## 2022-07-11 NOTE — Progress Notes (Signed)
Physical Therapy Session Note  Patient Details  Name: Desiree Stone MRN: [id number] Date of Birth: 15-Feb-1976  Today's Date: 07/11/2022 PT Individual Time: 1500-1530 PT Individual Time Calculation (min): 30 min   Short Term Goals: Week 1:  PT Short Term Goal 1 (Week 1): Pt w// ambulate 100 ft w/ CGA PT Short Term Goal 2 (Week 1): Pt will decrease TUG by > 5 seconds PT Short Term Goal 3 (Week 1): Pt will perform transfers Mod I  Skilled Therapeutic Interventions/Progress Updates:      Therapy Documentation Precautions:  Precautions Precautions: Fall Precaution Comments: watch O2 keep >90% Restrictions Weight Bearing Restrictions: No  Pt agreeable to PT session with emphasis on neuromuscular re-education with dynamic stability tasks. Pt on room air with O2 saturations >95% throughout session. Pt declines pain.   Pt close supervision with gait ~75 ft with no AD to dayroom. Pt with right antalgic gait pattern and decreased stance time on the right.   Pt positioned L LE on 4 inch step up to facilitate increased R LE weight shift and weightbearing while reaching L UE contralaterally to obtain objects outside of her base of support and requires CGA.   Pt performed lateral kicking of soccer ball each LE x 10 to address single leg dynamic balance with mobility and requires CGA to intermittent min A for posterolateral loss of balance.   Pt ambulated to room and left seated edge of bed with all needs in reach and alarm on.   Therapy/Group: Individual Therapy  Verl Dicker Verl Dicker PT, DPT  07/11/2022, 7:52 AM

## 2022-07-12 LAB — GLUCOSE, CAPILLARY
Glucose-Capillary: 97 mg/dL (ref 70–99)
Glucose-Capillary: 108 mg/dL — ABNORMAL HIGH (ref 70–99)
Glucose-Capillary: 140 mg/dL — ABNORMAL HIGH (ref 70–99)
Glucose-Capillary: 153 mg/dL — ABNORMAL HIGH (ref 70–99)

## 2022-07-12 LAB — BASIC METABOLIC PANEL
Anion gap: 9 (ref 5–15)
BUN: <5 mg/dL — ABNORMAL LOW (ref 6–20)
CO2: 28 mmol/L (ref 22–32)
Calcium: 9 mg/dL (ref 8.9–10.3)
Chloride: 101 mmol/L (ref 98–111)
Creatinine, Ser: 0.48 mg/dL (ref 0.44–1.00)
GFR, Estimated: >60 mL/min (ref >60)
Glucose, Bld: 96 mg/dL (ref 70–99)
Potassium: 3.5 mmol/L (ref 3.5–5.1)
Sodium: 138 mmol/L (ref 135–145)

## 2022-07-12 MED ORDER — FUROSEMIDE 40 MG PO TABS
40.0000 mg | ORAL_TABLET | Freq: Every day | ORAL | 0 refills | Status: AC
  Filled 2022-07-12: qty 30, 30d supply, fill #0

## 2022-07-12 MED ORDER — FAMOTIDINE 20 MG PO TABS
20.0000 mg | ORAL_TABLET | Freq: Two times a day (BID) | ORAL | 0 refills | Status: AC
  Filled 2022-07-12: qty 60, 30d supply, fill #0

## 2022-07-12 MED ORDER — INSULIN ASPART 100 UNIT/ML FLEXPEN
7.0000 [IU] | PEN_INJECTOR | Freq: Three times a day (TID) | SUBCUTANEOUS | 0 refills | Status: AC
  Filled 2022-07-12: qty 15, 72d supply, fill #0

## 2022-07-12 MED ORDER — ADULT MULTIVITAMIN W/MINERALS CH
1.0000 | ORAL_TABLET | Freq: Every day | ORAL | 0 refills | Status: AC
  Filled 2022-07-12: qty 30, 30d supply, fill #0

## 2022-07-12 MED ORDER — MELATONIN 5 MG PO TABS
5.0000 mg | ORAL_TABLET | Freq: Every evening | ORAL | 0 refills | Status: AC | PRN
  Filled 2022-07-12: qty 30, 30d supply, fill #0

## 2022-07-12 MED ORDER — OXYCODONE HCL 5 MG PO TABS
5.0000 mg | ORAL_TABLET | Freq: Two times a day (BID) | ORAL | 0 refills | Status: DC | PRN
  Filled 2022-07-12: qty 10, 5d supply, fill #0

## 2022-07-12 MED ORDER — TRAZODONE HCL 50 MG PO TABS
25.0000 mg | ORAL_TABLET | Freq: Every evening | ORAL | 0 refills | Status: DC | PRN
  Filled 2022-07-12: qty 10, 10d supply, fill #0

## 2022-07-12 MED ORDER — POTASSIUM CHLORIDE CRYS ER 10 MEQ PO TBCR
10.0000 meq | EXTENDED_RELEASE_TABLET | Freq: Two times a day (BID) | ORAL | 0 refills | Status: DC
  Filled 2022-07-12: qty 60, 30d supply, fill #0

## 2022-07-12 MED ORDER — ATORVASTATIN CALCIUM 40 MG PO TABS
40.0000 mg | ORAL_TABLET | Freq: Every day | ORAL | 0 refills | Status: AC
  Filled 2022-07-12: qty 30, 30d supply, fill #0

## 2022-07-12 MED ORDER — FLUTICASONE PROPIONATE HFA 110 MCG/ACT IN AERO
2.0000 | INHALATION_SPRAY | Freq: Two times a day (BID) | RESPIRATORY_TRACT | 0 refills | Status: AC
  Filled 2022-07-12: qty 12, 30d supply, fill #0

## 2022-07-12 MED ORDER — SERTRALINE HCL 50 MG PO TABS
50.0000 mg | ORAL_TABLET | Freq: Every day | ORAL | 0 refills | Status: DC
  Filled 2022-07-12: qty 30, 30d supply, fill #0

## 2022-07-12 MED ORDER — INSULIN GLARGINE 100 UNIT/ML SOLOSTAR PEN
30.0000 [IU] | PEN_INJECTOR | Freq: Two times a day (BID) | SUBCUTANEOUS | 0 refills | Status: AC
  Filled 2022-07-12: qty 15, 25d supply, fill #0

## 2022-07-12 MED ORDER — HYDROXYZINE HCL 10 MG PO TABS
10.0000 mg | ORAL_TABLET | Freq: Three times a day (TID) | ORAL | 0 refills | Status: DC | PRN
  Filled 2022-07-12: qty 30, 10d supply, fill #0

## 2022-07-12 NOTE — Progress Notes (Signed)
Occupational Therapy Discharge Summary  Patient Details  Name: Desiree Stone MRN: [id number] Date of Birth: Mar 09, 1976  Date of Discharge from OT service:July 12, 2022   Patient has met 10 of 10 long term goals due to improved activity tolerance, improved balance, and ability to compensate for deficits.  Patient to discharge at overall Modified Independent level.  Patient's care partner is independent to provide the necessary physical assistance at discharge.    Reasons goals not met: n/a  Recommendation:  Patient will benefit from ongoing skilled OT services in home health setting to continue to advance functional skills in the area of iADL.  Equipment: No equipment provided  Reasons for discharge: treatment goals met  Patient/family agrees with progress made and goals achieved: Yes  OT Discharge Precautions/Restrictions  Precautions Precautions: Fall Restrictions Weight Bearing Restrictions: No  ADL ADL Eating: Independent Grooming: Independent Where Assessed-Grooming: Standing at sink Upper Body Bathing: Independent Where Assessed-Upper Body Bathing: Shower Lower Body Bathing: Modified independent Where Assessed-Lower Body Bathing: Shower Upper Body Dressing: Independent Where Assessed-Upper Body Dressing: Edge of bed Lower Body Dressing: Modified independent Where Assessed-Lower Body Dressing: Other (Comment) (toilet) Toileting: Modified independent Where Assessed-Toileting: Glass blower/designer: Diplomatic Services operational officer Method: Counselling psychologist: Energy manager: Modified independent Vision Baseline Vision/History: 1 Wears glasses (pt has reading and distance glasses that she occassionlly wears) Patient Visual Report: No change from baseline Vision Assessment?: No apparent visual deficits Perception  Perception: Within Functional Limits Praxis Praxis: Intact Cognition Cognition Overall Cognitive  Status: Within Functional Limits for tasks assessed Orientation Level: Person;Place;Situation Person: Oriented Place: Oriented Situation: Oriented Memory: Appears intact Focused Attention: Appears intact Sustained Attention: Appears intact Selective Attention: Appears intact Awareness: Appears intact Problem Solving: Appears intact Safety/Judgment: Appears intact Brief Interview for Mental Status (BIMS) Repetition of Three Words (First Attempt): 3 Temporal Orientation: Year: Correct Temporal Orientation: Month: Accurate within 5 days Temporal Orientation: Day: Correct Recall: "Sock": Yes, no cue required Recall: "Blue": Yes, no cue required Recall: "Bed": Yes, no cue required BIMS Summary Score: 15 Sensation Sensation Light Touch: Impaired Detail Peripheral sensation comments: L foot numbness w/ impaired sensation Light Touch Impaired Details: Impaired LLE Hot/Cold: Appears Intact Proprioception: Impaired by gross assessment Additional Comments: able to detect all stimuli on the L foot, but able to detect all stimuli Coordination Gross Motor Movements are Fluid and Coordinated: No Fine Motor Movements are Fluid and Coordinated: Yes Coordination and Movement Description: Antalgic gait, decreased stance time on RLE, greatly improved from [employer] Test: limted ROM due to habitus in BLE Motor  Motor Motor: Other (comment) Motor - Discharge Observations: mild antalgic gait pattern on the LLE with radicular s/s. improved from Eval Mobility  Bed Mobility Bed Mobility: Rolling Right;Rolling Left;Supine to Sit;Sit to Supine Rolling Right: Independent Rolling Left: Independent Supine to Sit: Independent Sitting - Scoot to Edge of Bed: Independent Sit to Supine: Independent Transfers Sit to Stand: Independent Stand to Sit: Independent     Balance Static Sitting Balance Static Sitting - Level of Assistance: 7: Independent Dynamic Sitting Balance Dynamic Sitting - Level  of Assistance: 7: Independent Static Standing Balance Static Standing - Level of Assistance: 7: Independent Dynamic Standing Balance Dynamic Standing - Level of Assistance: 6: Modified independent (Device/Increase time) Extremity/Trunk Assessment RUE Assessment RUE Assessment: Within Functional Limits LUE Assessment LUE Assessment: Within Functional Limits   Desiree Stone 07/12/2022, 12:22 PM

## 2022-07-12 NOTE — Patient Care Conference (Cosign Needed Addendum)
Inpatient RehabilitationTeam Conference and Plan of Care Update Date: 07/12/2022   Time: 10:16 AM    Patient Name: Desiree Stone      Medical Record Number: [id number]  Date of Birth: 10-04-1975 Sex: Female         Room/Bed: 4W13C/4W13C-01 Payor Info: Payor: [place] / Plan: [place] MEDICAID [place] / Product Type: *No Product type* /    Admit Date/Time:  07/05/2022  3:47 PM  Primary Diagnosis:  [facility] Problems: Principal Problem:   Debility Active Problems:   Difficulty coping    Expected Discharge Date: Expected Discharge Date: 07/13/22  Team Members Present: Physician leading conference: Dr. Alysia Penna Social Worker Present: Erlene Quan, BSW Nurse Present: Dorien Chihuahua, RN PT Present: Barrie Folk, PT OT Present: Meriel Pica, OT SLP Present: Weston Anna, SLP     Current Status/Progress Goal Weekly Team Focus  Bowel/Bladder   Pt is continent of b/b. LBM: 2/19   Remain continent of b/b.   Assist w/ toileting needs q 2-4 hours and as needed.    Swallow/Nutrition/ Hydration               ADL's   set up to S with most ADLs, but continues to need min A with cleansing and donning pants   Mod I overall   ADL training, functional mobility, activity tolerance,  AE needs, pt education    Mobility   Mod I w/ bed mobility, supervision w/ sit to stand, supervision w/ stand pivot transfers, close supervision w/ gait >~150   Mod I w/ transfers and gait  endurance, dynamic balance, gait, general strenghtening, stairs, breathing techniques, lifestyle changes, family/caregiver education    Communication                Safety/Cognition/ Behavioral Observations               Pain   Pt c/o of L leg pain rating it as a 9/10; describing it as aching/discomfort. Pain medication given.   Pt will rate the pain at a lower rate than the inital rate, that's acceptable to them.   Assess pain q shift &  PRN.    Skin   Incision site where trach was placed - Gauze is in place   Maintain Skin integrity.  Assess skin q shift & PRN.      Discharge Planning:  Discharging home with mother planning to stay with patient and able to assist up to [place]. Patient is uninsured.   Team Discussion: Patient with debility; poor endurance, numbness tingling of left LE. Working on [employer] and breathing techniques. Patient on target to meet rehab goals: yes, currently needs mod I for self care , SLP services discontinued;post trach decannulation.  Continue to note antalgic gait.   *See Care Plan and progress notes for long and short-term goals.   Revisions to Treatment Plan:  SLP discontinued   Teaching Needs: Safety, medications, dietary modifications, transfers/gait, etc.   Current Barriers to Discharge: Decreased caregiver support, Home enviroment access/layout, Insurance for SNF coverage, and insurance for follow up services  Possible Resolutions to Barriers: Family education DME: [facility]     Medical Summary Current Status: No longer receiving SLP, Low K+ , numbness and tingling LLE , poor endurance  Barriers to Discharge: Morbid Obesity   Possible Resolutions to [employer] Focus: discharge in am   Continued Need for Acute Rehabilitation Level of Care: The patient requires daily medical management by  a physician with specialized training in physical medicine and rehabilitation for the following reasons: Direction of a multidisciplinary physical rehabilitation program to maximize functional independence : Yes Medical management of patient stability for increased activity during participation in an intensive rehabilitation regime.: Yes Analysis of laboratory values and/or radiology reports with any subsequent need for medication adjustment and/or medical intervention. : Yes   I attest that I was present, lead the team conference, and concur with the assessment and plan of the  team.   Dorien Chihuahua B 07/12/2022, 1:51 PM

## 2022-07-12 NOTE — Progress Notes (Signed)
Occupational Therapy Session Note  Patient Details  Name: Desiree Stone MRN: [id number] Date of Birth: 01/07/1976  Today's Date: 07/12/2022 OT Individual Time: [id number] and 1130-1200 OT Individual Time Calculation (min): 45 min and 30 min    Short Term Goals: Week 1:  OT Short Term Goal 1 (Week 1): Pt will thread BL Einto pants with AE PRN OT Short Term Goal 2 (Week 1): Pt will complete oral care in standing with no change in vitals to demo improved endurance OT Short Term Goal 3 (Week 1): Pt will complete 1/3 steps of toileting OT Short Term Goal 4 (Week 1): Pt will transfer to toilet with S and LRAD  Skilled Therapeutic Interventions/Progress Updates:    Visit 1:  Pain: no c/o pain  Pt received sitting EOB ready for therapy.  Pt had already toileted and did not need to shower again today.  Pt ambulated to the sink to brush teeth in standing independently. She gathered items in  her room independently.  Pt stated she was able to cleanse self well and has been able to toilet without A.  Gave her recommendation of where to order reacher.   Pt worked on sit to stands without AD independently. Used RW (pt choice) to ambulate to ADL apt with 1 rest breaks in between.  In Apt, she is able to get on and off low couch. In kitchen, pt practiced ambulation with no device, reaching into low cupboards to retrieve pots and pans, upper cabinets and refridgerator for food items. Discussed safe techniques with reaching out of Base of support such as stabilizing balance with 1 hand on counter.  Pt able to do all tasks with mod I.  Discussed energy conservation strategies for managing with house hold tasks.  Pt ambulated back to room with 1 rest break needed in chairs in hallway. Pt in room with all needs met.  Visit 2: Pain: no c/o pain  Pt resting in wc and ready for therapy.  Reviewed more strategies for energy conservation with IADLS.  Discussed how engaging in IADLs such as folding towels, cleaning  bathroom mirrors will be good activities to build arm strength and activity tolerance.  Gave pt a simple home exercise to do daily which she practiced several times to maintain flexibility in shoulders and back to enable her to not have difficulty with reaching her back side when cleansing. Pt worked on standing up, reaching arms over head, lateral leans with arms over head, torso twists, reaching B hands back with hands supinated then pronated and adding in chest expansion with hands behind back.  Pt participated well.   Pt is ready for discharge tomorrow.   Therapy Documentation Precautions:  Precautions Precautions: Fall Precaution Comments: watch O2 keep >90% Restrictions Weight Bearing Restrictions: Yes    Vital Signs: Therapy Vitals Temp: 98.5 F (36.9 C) Pulse Rate: 100 Resp: 16 BP: (Abnormal) 116/59 Patient Position (if appropriate): Lying Oxygen Therapy SpO2: 100 % O2 Device: Room Air       Therapy/Group: Individual Therapy  Katai Marsico 07/12/2022, 8:29 AM

## 2022-07-12 NOTE — Progress Notes (Signed)
Pt CPAP is set up and ready at bedside, pt stated she will place on herself when ready.

## 2022-07-12 NOTE — Progress Notes (Signed)
Occupational Therapy Session Note  Patient Details  Name: Desiree Stone MRN: [id number] Date of Birth: Aug 05, 1975  Today's Date: 07/12/2022 OT Individual Time: [id number] OT Individual Time Calculation (min): 26 min    Short Term Goals: Week 1:  OT Short Term Goal 1 (Week 1): Pt will thread BL Einto pants with AE PRN OT Short Term Goal 2 (Week 1): Pt will complete oral care in standing with no change in vitals to demo improved endurance OT Short Term Goal 3 (Week 1): Pt will complete 1/3 steps of toileting OT Short Term Goal 4 (Week 1): Pt will transfer to toilet with S and LRAD  Skilled Therapeutic Interventions/Progress Updates:  Pt greeted seated in w/c, pt agreeable to OT intervention. Session focus on implementing BUE HEP for home and education related to energy conservation strategies for home. Pt completed below therex with level 3 theraband:  X12 seated shoulder ABD scaption  X12 seated tricep extensions X12 horizontal shoulder ABD/ADD X12 shoulder flexion X12 shoulder extension X12 bicep curls   Pt does endorse fatigue post therex, education and handout provided on energy conservation strategies for home as well as techniques to manage fatigue at home. Pt very receptive to education even offering suggestions for strategies at home, handout provided on HEP and education to increase carryover.   Ended session with pt seated in w/c, pt MODI in room.   Therapy Documentation Precautions:  Precautions Precautions: Fall Precaution Comments: watch O2 keep >90% Restrictions Weight Bearing Restrictions: No  Pain: no pain     Therapy/Group: Individual Therapy  Corinne Ports [facility] 07/12/2022, 4:05 PM

## 2022-07-12 NOTE — Progress Notes (Signed)
Patient ID: Desiree Stone, female   DOB: 05-07-1976, 47 y.o.   MRN: [id number]  [facility] ordered through [facility].

## 2022-07-12 NOTE — Progress Notes (Signed)
Physical Therapy Discharge Summary  Patient Details  Name: Desiree Stone MRN: [id number] Date of Birth: 1975-06-12  Date of Discharge from PT service:July 12, 2022  Today's Date: 07/12/2022 PT Individual Time: [id number] and 1205-1306 PT Individual Time Calculation (min): 43 min and 61 min     Patient has met {NUMBERS 0-12:18577} of {NUMBERS 0-12:18577} long term goals due to {due JE:9731721.  Patient to discharge at [facility] level {LOA:3049010}.   Patient's care partner {care partner:3041650} to provide the necessary {assistance:3041652} assistance at discharge.  Reasons goals not met: ***  Recommendation:  Patient will benefit from ongoing skilled PT services in {setting:3041680} to continue to advance safe functional mobility, address ongoing impairments in ***, and minimize fall risk.  Equipment: {equipment:3041657}  Reasons for discharge: {Reason for discharge:3049018}  Patient/family agrees with progress made and goals achieved: {Pt/Family agree with progress/goals:3049020}  PT treatment:  Session 1.  PT instructed pt in Grad day assessment to measure progress toward goals. See below for details. CARETool mobility assessment  also completed; see CAREtool tab in navigator for details.  Gait training with and without AD.  Car transfer training.  Stair management.  Gait training over ramp with rail on the R side.   Patient returned to room and left sitting in [facility] with call bell in reach and all needs met.   SpO2 >97% throughout session following all bout   Session 2.  Pt received sitting in WC and agreeable to PT  PT instructed pt in TUG: 16.1 sec with no AD and 14.2sec with SPC  (average of 3 trials; >13.5 sec indicates increased fall risk)  2 Min Walk Test:  Instructed patient to ambulate as quickly and as safely as possible for 6 minutes using LRAD. Patient was allowed to take standing rest breaks without stopping the test, but if the patient required a  sitting rest break the clock would be stopped and the test would be over.  Results: 225 feet using a SPC with out assist from PT . Results indicate that the patient has reduced endurance with ambulation compared to age matched norms.  Norms 561f.   Patient demonstrates increased fall risk as noted by score of  46 /56 on Berg Balance Scale.  (<36= high risk for falls, close to 100%; 37-45 significant >80%; 46-51 moderate >50%; 52-55 lower >25%)   PT Discharge Precautions/Restrictions Precautions Precautions: Fall Restrictions Weight Bearing Restrictions: No Vital Signs  Pain Pain Assessment Pain Scale: 0-10 Pain Score: 0-No pain Pain Interference Pain Interference Pain Effect on Sleep: 2. Occasionally Pain Interference with Therapy Activities: 1. Rarely or not at all Pain Interference with Day-to-Day Activities: 1. Rarely or not at all Vision/Perception  Perception Perception: Within Functional Limits Praxis Praxis: Intact  Cognition Overall Cognitive Status: Within Functional Limits for tasks assessed Focused Attention: Appears intact Sustained Attention: Appears intact Selective Attention: Appears intact Memory: Appears intact Awareness: Appears intact Problem Solving: Appears intact Safety/Judgment: Appears intact Sensation Sensation Light Touch: Impaired Detail Peripheral sensation comments: L foot numbness w/ impaired sensation Light Touch Impaired Details: Impaired LLE Hot/Cold: Appears Intact Proprioception: Impaired by gross assessment Additional Comments: able to detect all stimuli on the L foot, but able to detect all stimuli Coordination Gross Motor Movements are Fluid and Coordinated: No Fine Motor Movements are Fluid and Coordinated: Yes Coordination and Movement Description: Antalgic gait, decreased stance time on RLE, greatly improved from ECalpine CorporationTest: limted ROM due to habitus in BLE Motor  Motor Motor: Other (comment) Motor -  Discharge  Observations: mild antalgic gait pattern on the LLE with radicular s/s. improved from Eval  Mobility Bed Mobility Bed Mobility: Rolling Right;Rolling Left;Supine to Sit;Sit to Supine Rolling Right: Independent Rolling Left: Independent Supine to Sit: Independent Sitting - Scoot to Edge of Bed: Independent Sit to Supine: Independent Transfers Transfers: Sit to Stand;Stand to [employer] Transfers Sit to Stand: Independent Stand to Sit: Independent Stand Pivot Transfers: Independent with assistive device (SPC and no AD) Transfer (Assistive device): Straight cane Locomotion     Trunk/Postural Assessment     Balance Static Sitting Balance Static Sitting - Level of Assistance: 7: Independent Dynamic Sitting Balance Dynamic Sitting - Level of Assistance: 7: Independent Static Standing Balance Static Standing - Level of Assistance: 7: Independent Dynamic Standing Balance Dynamic Standing - Level of Assistance: 6: Modified independent (Device/Increase time) Extremity Assessment  RUE Assessment RUE Assessment: Within Functional Limits LUE Assessment LUE Assessment: Within Functional Limits RLE Assessment RLE Assessment: Within Functional Limits RLE Strength Right Hip Flexion: 4/5 Right Hip Extension: 4/5 Right Hip ABduction: 4+/5 Right Hip ADduction: 4+/5 Right Knee Flexion: 4+/5 Right Knee Extension: 5/5 Right Ankle Dorsiflexion: 4+/5 Right Ankle Plantar Flexion: 4+/5 LLE Assessment LLE Assessment: Exceptions to [facility] LLE Strength LLE Overall Strength: Deficits Left Hip Flexion: 4-/5 Left Hip ABduction: 4/5 Left Hip ADduction: 4/5 Left Knee Flexion: 4-/5 Left Knee Extension: 4+/5 Left Ankle Dorsiflexion: 4/5 Left Ankle Plantar Flexion: 4-/5   Lorie Phenix 07/12/2022, 1:08 PM

## 2022-07-12 NOTE — Progress Notes (Signed)
PROGRESS NOTE   Subjective/Complaints:  Working with OT, will be going to ADL kitchen   ROS: No  CP, SOB, increasing swelling, cough, abd pain, n/v/d/c  Objective:   No results found. Recent Labs    07/10/22 0624  WBC 6.7  HGB 10.7*  HCT 36.7  PLT 221     Recent Labs    07/10/22 0624 07/12/22 0553  NA 138 138  K 4.2 3.5  CL 99 101  CO2 25 28  GLUCOSE 95 96  BUN 5* <5*  CREATININE 0.58 0.48  CALCIUM 9.1 9.0      Intake/Output Summary (Last 24 hours) at 07/12/2022 0844 Last data filed at 07/12/2022 0825 Gross per 24 hour  Intake 480 ml  Output --  Net 480 ml         Physical Exam: Vital Signs Blood pressure (!) 116/59, pulse 100, temperature 98.5 F (36.9 C), resp. rate 16, height 4' 11"$  (1.499 m), weight 134.5 kg, SpO2 100 %.  General: No acute distress, sitting on edge of bed doing word puzzles Mood and affect are appropriate ENT- track site depigmented no active drainage  Heart: Regular rate and rhythm no rubs murmurs or extra sounds Lungs: Clear to auscultation, breathing unlabored, no rales or wheezes, Abdomen: Positive bowel sounds, soft nontender to palpation, nondistended Extremities: No clubbing, cyanosis, or edema Skin: No evidence of breakdown, no evidence of rash  Musculoskeletal: Full range of motion in all 4 extremities. No joint swelling   Assessment/Plan: 1. Functional deficits which require 3+ hours per day of interdisciplinary therapy in a comprehensive inpatient rehab setting. Physiatrist is providing close team supervision and 24 hour management of active medical problems listed below. Physiatrist and rehab team continue to assess barriers to discharge/monitor patient progress toward functional and medical goals  Care Tool:  Bathing    Body parts bathed by patient: Right arm, Left arm, Chest, Abdomen, Front perineal area, Right upper leg, Left upper leg, Face   Body  parts bathed by helper: Buttocks     Bathing assist Assist Level: Minimal Assistance - Patient > 75%     Upper Body Dressing/Undressing Upper body dressing   What is the patient wearing?: Pull over shirt    Upper body assist Assist Level: Set up assist    Lower Body Dressing/Undressing Lower body dressing      What is the patient wearing?: Pants     Lower body assist Assist for lower body dressing: Moderate Assistance - Patient 50 - 74%     Toileting Toileting    Toileting assist Assist for toileting: Maximal Assistance - Patient 25 - 49%     Transfers Chair/bed transfer  Transfers assist     Chair/bed transfer assist level: Contact Guard/Touching assist     Locomotion Ambulation   Ambulation assist      Assist level: Contact Guard/Touching assist Assistive device: Walker-rolling Max distance: 1f   Walk 10 feet activity   Assist     Assist level: Minimal Assistance - Patient > 75% Assistive device: Walker-rolling   Walk 50 feet activity   Assist Walk 50 feet with 2 turns activity did not occur: Safety/medical concerns  Walk 150 feet activity   Assist Walk 150 feet activity did not occur: Safety/medical concerns         Walk 10 feet on uneven surface  activity   Assist     Assist level: Contact Guard/Touching assist Assistive device:  Arts development officer)   Wheelchair     Assist Is the patient using a wheelchair?: Yes Type of Wheelchair: Manual    Wheelchair assist level: Contact Guard/Touching assist Max wheelchair distance: 52f    Wheelchair 50 feet with 2 turns activity    Assist        Assist Level: Minimal Assistance - Patient > 75%   Wheelchair 150 feet activity     Assist      Assist Level: Maximal Assistance - Patient 25 - 49%   Blood pressure (!) 116/59, pulse 100, temperature 98.5 F (36.9 C), resp. rate 16, height 4' 11"$  (1.499 m), weight 134.5 kg, SpO2 100 %.  Medical Problem List  and Plan: 1. Functional deficits secondary to Debility due to respiratory failure, CHF, COPD , Pneumonia              -patient may  shower             -ELOS/Goals: 8-10 days, PT and OT sup to mod I, SLP mod I, should be this week, hope to wean off O2 prior to d/c             -Cont CIR, PT/OT/SLP Team conference today please see physician documentation under team conference tab, met with team  to discuss problems,progress, and goals. Formulized individual treatment plan based on medical history, underlying problem and comorbidities.  2.  Antithrombotics: -DVT/anticoagulation:  Pharmaceutical: Lovenox 664mQD, may d/c if amb >50' at a time with PT              -antiplatelet therapy: N/A 3. Pain Management: Oxycodone 1033m6h prn.  4. Mood/Behavior/Sleep: LCSW to follow for evaluation and support. Zoloft 70m26mily             -antipsychotic agents: N/A             --Melatonin 5mg 82m prn insomnia. Prn Trazodone  -Hydroxyzine 10mg 66mPRN for itching/anxiety 5. Neuropsych/cognition: This patient is capable of making decisions on his own behalf. 6. Skin/Wound Care: Routine pressure relief measures.  -add juven for nutritional support--not added, weekday team to assess if this is still needed 7. Fluids/Electrolytes/Nutrition:  Monitor I/O. Monitor weekly labs starting 07/10/22 -cont multivitamins 8.Hypercarbic respiratory failure s/p trach:             -Supplemental O2 to maintain O2 sats above 88%  -07/08/22 trach decannulated on 07/07/22, doing well, monitor 9. COPD: Budesonide nebs d/c 02/09 and on duo nebs BID+q4h PRN now. 10. Congestive heart failure: Strict I/O--lasix decreased to 40 mg/daily on 02/13. Daily weight.             --weight at admission 300 lbs-->discharge wt 284 lbs (129.1kg 07/07/22)             --Monitor  for signs of overload.  -07/08/22 weight up a bit, no edema noted, monitor for now -07/09/22 wt up again but suspect it's because it's a bed weight; standing weights done  while hospitalized; had standing weight now and is 130.7kg which is much more stable from prior; cont to monitor. No signs of fluid overload.  Filed Weights   07/09/22 0507 07/09/22 0926 07/10/22 0313  Weight: (!) 138.1 kg 130.7 kg  134.5 kg    11. T2DM: hgb A1c-9.0 and poorly controlled.  --continue Insulin Glargine 30 units BID w/SSI meal coverage, 7 units novolog mealtime TID -07/12/22 well controlled, monitor CBG (last 3)  Recent Labs    07/11/22 1619 07/11/22 2025 07/12/22 0541  GLUCAP 108* 122* 97    12. Hypokalemia: Has been supplemented intermittently     Latest Ref Rng & Units 07/12/2022    5:53 AM 07/10/2022    6:24 AM 07/06/2022    6:48 AM  BMP  Glucose 70 - 99 mg/dL 96  95  117   BUN 6 - 20 mg/dL 5  5  6   $ Creatinine 0.44 - 1.00 mg/dL 0.48  0.58  0.48   Sodium 135 - 145 mmol/L 138  138  134   Potassium 3.5 - 5.1 mmol/L 3.5  4.2  3.3   Chloride 98 - 111 mmol/L 101  99  95   CO2 22 - 32 mmol/L 28  25  28   $ Calcium 8.9 - 10.3 mg/dL 9.0  9.1  9.1    K+ normalized , now off KCL but still on Lasix will recheck in am to determine if daily supplement is needed  13. Leucocytosis: resolved    Latest Ref Rng & Units 07/10/2022    6:24 AM 07/06/2022    6:48 AM 07/03/2022    3:54 AM  CBC  WBC 4.0 - 10.5 K/uL 6.7  8.6  12.7   Hemoglobin 12.0 - 15.0 g/dL 10.7  11.2  11.2   Hematocrit 36.0 - 46.0 % 36.7  39.8  41.3   Platelets 150 - 400 K/uL 221  255  333     14. Iron deficiency anemia: Hgb stable  15.  Depression: Zoloft 1m daily 16. Dysphagia, Dys 1/thin diet             -Continue SLP 17. Dyslipidemia: Total 134/LDL 83/Trig 1641 (highest) in 05/2022; continue Lipitor 43mQD    LOS: 7 days A FACE TO FACE EVALUATION WAS PERFORMED  AnCharlett Blake/21/2024, 8:44 AM

## 2022-07-12 NOTE — Progress Notes (Signed)
Patient ID: Desiree Stone, female   DOB: 11-08-1975, 47 y.o.   MRN: [id number]  Team Conference Report to Patient/Family  Team Conference discussion was reviewed with the patient and caregiver, including goals, any changes in plan of care and target discharge date.  Patient and caregiver express understanding and are in agreement.  The patient has a target discharge date of 07/13/22.  Sw met with patient and provided team conference updates. Patient provided disability form to be complete before discharge. Patient will require a SPC, no additional needs. No additional questions or concerns.  [name] 07/12/2022, 2:30 PM

## 2022-07-13 ENCOUNTER — Other Ambulatory Visit (HOSPITAL_COMMUNITY): Payer: Self-pay

## 2022-07-13 DIAGNOSIS — J449 Chronic obstructive pulmonary disease, unspecified: Secondary | ICD-10-CM | POA: Insufficient documentation

## 2022-07-13 LAB — GLUCOSE, CAPILLARY: Glucose-Capillary: 79 mg/dL (ref 70–99)

## 2022-07-13 MED ORDER — INSULIN PEN NEEDLE 32G X 4 MM MISC
1.0000 | Freq: Four times a day (QID) | 0 refills | Status: DC
  Filled 2022-07-13: qty 100, 30d supply, fill #0

## 2022-07-13 NOTE — Progress Notes (Addendum)
Inpatient Rehabilitation Discharge Medication Review by a Pharmacist  A complete drug regimen review was completed for this patient to identify any potential clinically significant medication issues.  High Risk Drug Classes Is patient taking? Indication by Medication  Antipsychotic No   Anticoagulant No   Antibiotic No   Opioid Yes Oxycodone prn pain  Antiplatelet No   Hypoglycemics/insulin Yes Insulin- DM  Vasoactive Medication Yes Furosemide - Fluid  Chemotherapy No   Other Yes Albuterol / Atrovent prn SOB Dulera - asthma Atorvastatin - HLD Famotidine - Reflux Hydroxyzine prn anxiety Potassium - supplement  Sertraline - mood Trazodone prn sleep     Type of Medication Issue Identified Description of Issue Recommendation(s)  Drug Interaction(s) (clinically significant)     Duplicate Therapy     Allergy     No Medication Administration End Date     Incorrect Dose     Additional Drug Therapy Needed     Significant med changes from prior encounter (inform family/care partners about these prior to discharge).    Other       Clinically significant medication issues were identified that warrant physician communication and completion of prescribed/recommended actions by midnight of the next day:  No  Time spent performing this drug regimen review (minutes):  20 minutes  Thank you Anette Guarneri, PharmD

## 2022-07-13 NOTE — Plan of Care (Signed)
  Problem: RH Balance Goal: LTG: Patient will maintain dynamic sitting balance (OT) Description: LTG:  Patient will maintain dynamic sitting balance with assistance during activities of daily living (OT) Outcome: Completed/Met Goal: LTG Patient will maintain dynamic standing with ADLs (OT) Description: LTG:  Patient will maintain dynamic standing balance with assist during activities of daily living (OT)  Outcome: Completed/Met   Problem: RH Grooming Goal: LTG Patient will perform grooming w/assist,cues/equip (OT) Description: LTG: Patient will perform grooming with assist, with/without cues using equipment (OT) Outcome: Completed/Met   Problem: RH Bathing Goal: LTG Patient will bathe all body parts with assist levels (OT) Description: LTG: Patient will bathe all body parts with assist levels (OT) Outcome: Completed/Met   Problem: RH Dressing Goal: LTG Patient will perform upper body dressing (OT) Description: LTG Patient will perform upper body dressing with assist, with/without cues (OT). Outcome: Completed/Met Goal: LTG Patient will perform lower body dressing w/assist (OT) Description: LTG: Patient will perform lower body dressing with assist, with/without cues in positioning using equipment (OT) Outcome: Completed/Met   Problem: RH Toileting Goal: LTG Patient will perform toileting task (3/3 steps) with assistance level (OT) Description: LTG: Patient will perform toileting task (3/3 steps) with assistance level (OT)  Outcome: Completed/Met   Problem: RH Simple Meal Prep Goal: LTG Patient will perform simple meal prep w/assist (OT) Description: LTG: Patient will perform simple meal prep with assistance, with/without cues (OT). Outcome: Completed/Met   Problem: RH Toilet Transfers Goal: LTG Patient will perform toilet transfers w/assist (OT) Description: LTG: Patient will perform toilet transfers with assist, with/without cues using equipment (OT) Outcome: Completed/Met    Problem: RH Tub/Shower Transfers Goal: LTG Patient will perform tub/shower transfers w/assist (OT) Description: LTG: Patient will perform tub/shower transfers with assist, with/without cues using equipment (OT) Outcome: Completed/Met

## 2022-07-13 NOTE — Progress Notes (Signed)
INPATIENT REHABILITATION DISCHARGE NOTE   Discharge instructions by:PAM PA  Verbalized understanding:yes  Skin care/Wound care healing?none  Pain:leg left; pain med given  IV's:none  Tubes/Drains:none  O2:none  Safety instructions:done  Patient belongings:done  Discharged [id number]  Discharged via:car  Notes:

## 2022-07-13 NOTE — Plan of Care (Signed)
  Problem: RH Balance Goal: LTG Patient will maintain dynamic sitting balance (PT) Description: LTG:  Patient will maintain dynamic sitting balance with assistance during mobility activities (PT) Outcome: Completed/Met Goal: LTG Patient will maintain dynamic standing balance (PT) Description: LTG:  Patient will maintain dynamic standing balance with assistance during mobility activities (PT) Outcome: Completed/Met   Problem: Sit to Stand Goal: LTG:  Patient will perform sit to stand with assistance level (PT) Description: LTG:  Patient will perform sit to stand with assistance level (PT) Outcome: Completed/Met   Problem: RH Bed Mobility Goal: LTG Patient will perform bed mobility with assist (PT) Description: LTG: Patient will perform bed mobility with assistance, with/without cues (PT). Outcome: Completed/Met   Problem: RH Bed to Chair Transfers Goal: LTG Patient will perform bed/chair transfers w/assist (PT) Description: LTG: Patient will perform bed to chair transfers with assistance (PT). Outcome: Completed/Met   Problem: RH Car Transfers Goal: LTG Patient will perform car transfers with assist (PT) Description: LTG: Patient will perform car transfers with assistance (PT). Outcome: Completed/Met   Problem: RH Wheelchair Mobility Goal: LTG Patient will propel w/c in controlled environment (PT) Description: LTG: Patient will propel wheelchair in controlled environment, # of feet with assist (PT) Outcome: Completed/Met Goal: LTG Patient will propel w/c in community environment (PT) Description: LTG: Patient will propel wheelchair in community environment, # of feet with assist (PT) Outcome: Completed/Met   Problem: RH Stairs Goal: LTG Patient will ambulate up and down stairs w/assist (PT) Description: LTG: Patient will ambulate up and down # of stairs with assistance (PT) Outcome: Completed/Met   Problem: RH Furniture Transfers Goal: LTG Patient will perform furniture  transfers w/assist (OT/PT) Description: LTG: Patient will perform furniture transfers  with assistance (OT/PT). Outcome: Completed/Met   Problem: RH Ambulation Goal: LTG Patient will ambulate in controlled environment (PT) Description: LTG: Patient will ambulate in a controlled environment, # of feet with assistance (PT). Outcome: Completed/Met Goal: LTG Patient will ambulate in home environment (PT) Description: LTG: Patient will ambulate in home environment, # of feet with assistance (PT). Outcome: Completed/Met Goal: LTG Patient will ambulate in community environment (PT) Description: LTG: Patient will ambulate in community environment, # of feet with assistance (PT). Outcome: Completed/Met

## 2022-07-13 NOTE — Discharge Summary (Signed)
Physician Discharge Summary  Patient ID: Desiree Stone MRN: [id number] DOB/AGE: 1975/08/31 47 y.o.  Admit date: 07/05/2022 Discharge date: 07/13/2022  Discharge Diagnoses:  Principal Problem:   Debility Active Problems:   Obstructive sleep apnea   Essential hypertension   Type 2 diabetes mellitus (HCC)   Asthma   Difficulty coping   Severe obesity (BMI >= 40) (HCC)   COPD (chronic obstructive pulmonary disease) (HCC)   Discharged Condition: stable  Significant Diagnostic Studies: N/A   Labs:  Basic Metabolic Panel:    Latest Ref Rng & Units 07/12/2022    5:53 AM 07/10/2022    6:24 AM 07/06/2022    6:48 AM  BMP  Glucose 70 - 99 mg/dL 96  95  409   BUN 6 - 20 mg/dL 5  5  6    Creatinine 0.44 - 1.00 mg/dL 8.11  9.14  7.82   Sodium 135 - 145 mmol/L 138  138  134   Potassium 3.5 - 5.1 mmol/L 3.5  4.2  3.3   Chloride 98 - 111 mmol/L 101  99  95   CO2 22 - 32 mmol/L 28  25  28    Calcium 8.9 - 10.3 mg/dL 9.0  9.1  9.1      CBC:    Latest Ref Rng & Units 07/10/2022    6:24 AM 07/06/2022    6:48 AM 07/03/2022    3:54 AM  CBC  WBC 4.0 - 10.5 K/uL 6.7  8.6  12.7   Hemoglobin 12.0 - 15.0 g/dL 95.6  21.3  08.6   Hematocrit 36.0 - 46.0 % 36.7  39.8  41.3   Platelets 150 - 400 K/uL 221  255  333      CBG: Recent Labs  Lab 07/12/22 0541 07/12/22 1148 07/12/22 1646 07/12/22 2005 07/13/22 0558  GLUCAP 97 108* 140* 153* 79   History of Brief HPI:   Desiree Stone is a 47 y.o. female with history of HTN, T2DM, superobesity-BMI 57, OSA with inability to use CPAP for few months was admitted to [facility] on 06/06/2022 with 3-week history of productive cough, shortness of breath as well as lower extremity edema.  2D echo done showing EF 60 to 65% with biatrial dilatation.  CTA chest showed cardiomegaly with enlarged PA's suggestive of PAH and reflux contrast into IVC suggesting right heart dysfunction.  Her respiratory status declined due to persistent acidosis requiring  intubation.  Dr. Donald Prose felt that acute respiratory failure was in setting of hypercapnia with hypoxia and possible CHF on arrival in setting of severe morbid obesity, COPD exacerbation and likely sleep apnea.  She was treated with IV diuresis and IV Solu-Medrol as well as ceftriaxone for CAP.  Hospital course significant for failure of extubation attempts requiring tracheostomy on 01/29.  She was started on tube feeds for nutritional support and poorly controlled blood sugars were treated with IV insulin.  She was weaned to 8 L oxygen per ATC and downsized to XLT #6 on 02/12.  She was tolerating Passy-Muir speaking valve trials and was noted to have moderately hoarse voice with intermittent coughing.  She was started on D1 thins with strict aspiration precautions and was tolerating this without signs or symptoms of aspiration.  Therapy was working with patient who continued to be limited by weakness, LLE numbness with prolonged sitting activity, fatigue requiring multiple rest breaks with activity as well as tachycardia with ambulating short distances.  CIR was recommended due to functional decline.   Hospital  Course: Desiree Stone was admitted to rehab 07/05/2022 for inpatient therapies to consist of PT, ST and OT at least three hours five days a week. Past admission physiatrist, therapy team and rehab RN have worked together to provide customized collaborative inpatient rehab.  Her blood pressures were monitored on TID basis and were noted to be well-controlled.  Daily weights showed some elevation without signs of overload. Dr. Katrinka Blazing was consulted for input on trach wean. She was using PMSV without difficulty and was compliant with CPAP use. She was decannualted on 02/16 and respiratory status is stable on current regimen.. She has been  referred to pulmonary for outpatient follow up as well as sleep study.  Her diabetes has been monitored with ac/hs CBG checks and SSI was use prn for tighter BS  control.  She continues on insulin glargine twice daily with meal coverage and has been advised to continue this at discharge.  She is to monitor blood sugars on 4 times daily basis and follow-up with PCP for further adjustment in her hypoglycemic regimen.    Juven was added for nutritional support in addition to vitamins to help promote healing.  Her tracheal stoma is healing in nicely.  Follow-up labs showed hypokalemia which has resolved with addition of supplementation.  Hyponatremia has resolved and renal status is stable.  Repeat CBC shows H&H to be relatively stable and reactive leukocytosis has resolved.  Her swallow function has improved and she was advanced to dysphagia 3 diet which she was tolerating without S/S of aspiration.  Mood has been stable with use of atarax to manage anxiety and she has made good gains during her rehab stay.  She is modified independent at discharge and HEP was provided to help her continue to build endurance.  She was discharged to home on 07/13/2022.   Rehab course: During patient's stay in rehab weekly team conferences were held to monitor patient's progress, set goals and discuss barriers to discharge. At admission, patient required min to  max assist for ADL tasks and  min assist with mobility. Voice noted to be hoarse and D3 diet recommended with supervision for safety. She  has had improvement in activity tolerance, balance, postural control as well as ability to compensate for deficits.  She is able to complete ADL tasks at modified independent level. She is independent for tranfers and to ambulate 225 ' with SPC. She was tolerating regular textures without s/s of aspiration with use of compensatory strategies independently for vocal intensity and signed off on 02/19.   Discharge disposition: 01-Home or Self Care  Diet: Car b modified.   Special Instructions: 1, No smoking 2. Monitor BS ac/hs and follow up with PCP for further adjustment.    Discharge  Instructions     Ambulatory referral to Pulmonology   Complete by: As directed    Needs to be retested for OSA/CPAP   Reason for referral: Asthma/COPD      Allergies as of 07/13/2022       Reactions   Abilify [aripiprazole] Other (See Comments)   Syncope   Effexor [venlafaxine] Other (See Comments)   Hair Loss   Wellbutrin [bupropion] Other (See Comments)   Acne        Medication List     STOP taking these medications    enalapril 20 MG tablet Commonly known as: VASOTEC   hydrochlorothiazide 25 MG tablet Commonly known as: HYDRODIURIL   insulin glargine-yfgn 100 UNIT/ML injection Commonly known as: SEMGLEE   liraglutide  18 MG/3ML Sopn Commonly known as: VICTOZA   metFORMIN 500 MG 24 hr tablet Commonly known as: GLUCOPHAGE-XR       TAKE these medications    acetaminophen 325 MG tablet Commonly known as: TYLENOL Take 1-2 tablets (325-650 mg total) by mouth every 4 (four) hours as needed for mild pain.   albuterol 108 (90 Base) MCG/ACT inhaler Commonly known as: VENTOLIN HFA Inhale into the lungs every 6 (six) hours as needed for wheezing or shortness of breath.   atorvastatin 40 MG tablet Commonly known as: LIPITOR Take 1 tablet (40 mg total) by mouth at bedtime.   cetirizine 10 MG tablet Commonly known as: ZYRTEC Take 10 mg by mouth daily.   cyanocobalamin 1000 MCG tablet Take 1,000 mcg by mouth daily.   famotidine 20 MG tablet Commonly known as: PEPCID Take 1 tablet (20 mg total) by mouth 2 (two) times daily.   fluticasone 110 MCG/ACT inhaler Commonly known as: FLOVENT HFA Inhale 2 puffs into the lungs 2 (two) times daily. What changed: when to take this   furosemide 40 MG tablet Commonly known as: LASIX Take 1 tablet (40 mg total) by mouth daily.   hydrOXYzine 10 MG tablet Commonly known as: ATARAX Take 1 tablet (10 mg total) by mouth 3 (three) times daily as needed for anxiety. What changed:  medication strength how much to  take reasons to take this   insulin aspart 100 UNIT/ML FlexPen Commonly known as: NOVOLOG Inject 7 Units into the skin 3 (three) times daily with meals.   insulin glargine 100 UNIT/ML Solostar Pen Commonly known as: LANTUS Inject 30 Units into the skin 2 (two) times daily.   ipratropium 17 MCG/ACT inhaler Commonly known as: ATROVENT HFA Inhale 2 puffs into the lungs every 4 (four) hours as needed for wheezing.   melatonin 5 MG Tabs Take 1 tablet (5 mg total) by mouth at bedtime as needed.   multivitamin with minerals Tabs tablet Take 1 tablet by mouth daily.   oxyCODONE 5 MG immediate release tablet--Rx# 10 pills. Commonly known as: Oxy IR/ROXICODONE Take 1 tablet (5 mg total) by mouth 2 (two) times daily as needed for severe pain. Notes to patient: Need to limit to 1/2 to one tablet per day   potassium chloride 10 MEQ tablet Commonly known as: KLOR-CON M Take 1 tablet (10 mEq total) by mouth 2 (two) times daily.   sertraline 50 MG tablet Commonly known as: ZOLOFT Take 1 tablet (50 mg total) by mouth daily at 8 pm.   traZODone 50 MG tablet Commonly known as: DESYREL Take 0.5-1 tablets (25-50 mg total) by mouth at bedtime as needed for sleep.        Follow-up [employer] Follow up.   Why: Call in 1-2 days for post hospital follow up Contact information: [address] [place] [address] [phone]         Erick Colace, MD Follow up.   Specialty: Physical Medicine and Rehabilitation Why: As needed Contact information: [address] [place] [address] [phone]         [facility] Follow up.   Specialty: Pulmonary Rehab Why: office will call you with follow up appointment Contact information: [id number] [place] [address] [phone]                Signed: Jacquelynn Cree 07/14/2022, 12:51 PM

## 2022-07-13 NOTE — Progress Notes (Signed)
Patient ID: Desiree Stone, female   DOB: 19-Oct-1975, 47 y.o.   MRN: [id number]  SW followed up with patient before discharge to discuss any questions or concerns. Handicapped placement card application provided. Patient currently in the process of obtaining Medicaid and Disability. HEP provided to patient. DME delivered. SW contact information provided to discuss changes/updates. No additional questions or concerns.

## 2022-07-13 NOTE — Progress Notes (Signed)
PROGRESS NOTE   Subjective/Complaints:  Excited about d/c  ROS: No  CP, SOB, increasing swelling, cough, abd pain, n/v/d/c  Objective:   No results found. No results for input(s): "WBC", "HGB", "HCT", "PLT" in the last 72 hours.   Recent Labs    07/12/22 0553  NA 138  K 3.5  CL 101  CO2 28  GLUCOSE 96  BUN <5*  CREATININE 0.48  CALCIUM 9.0      Intake/Output Summary (Last 24 hours) at 07/13/2022 0824 Last data filed at 07/12/2022 0825 Gross per 24 hour  Intake 240 ml  Output --  Net 240 ml         Physical Exam: Vital Signs Blood pressure 121/75, pulse 89, temperature 98 F (36.7 C), resp. rate 16, height 4' 11"$  (1.499 m), weight 134.9 kg, SpO2 94 %.  General: No acute distress, sitting on edge of bed doing word puzzles Mood and affect are appropriate ENT- track site depigmented no active drainage  Heart: Regular rate and rhythm no rubs murmurs or extra sounds Lungs: Clear to auscultation, breathing unlabored, no rales or wheezes, Abdomen: Positive bowel sounds, soft nontender to palpation, nondistended Extremities: No clubbing, cyanosis, or edema Skin: No evidence of breakdown, no evidence of rash  Musculoskeletal: Full range of motion in all 4 extremities. No joint swelling   Assessment/Plan: 1. Functional deficits due to debility respiratory failure  Stable for D/C today F/u PCP in 3-4 weeks F/u PM&R 2 weeks See D/C summary See D/C instructions   Care Tool:  Bathing    Body parts bathed by patient: Right arm, Left arm, Chest, Abdomen, Front perineal area, Right upper leg, Left upper leg, Face   Body parts bathed by helper: Buttocks     Bathing assist Assist Level: Minimal Assistance - Patient > 75%     Upper Body Dressing/Undressing Upper body dressing   What is the patient wearing?: Pull over shirt    Upper body assist Assist Level: Set up assist    Lower Body  Dressing/Undressing Lower body dressing      What is the patient wearing?: Pants     Lower body assist Assist for lower body dressing: Moderate Assistance - Patient 50 - 74%     Toileting Toileting    Toileting assist Assist for toileting: Maximal Assistance - Patient 25 - 49%     Transfers Chair/bed transfer  Transfers assist     Chair/bed transfer assist level: Independent with assistive device Chair/bed transfer assistive device: Research officer, political party   Ambulation assist      Assist level: Independent with assistive device Assistive device: Cane-straight Max distance: 225   Walk 10 feet activity   Assist     Assist level: Independent with assistive device Assistive device: Cane-straight   Walk 50 feet activity   Assist Walk 50 feet with 2 turns activity did not occur: Safety/medical concerns  Assist level: Independent with assistive device Assistive device: Cane-straight    Walk 150 feet activity   Assist Walk 150 feet activity did not occur: Safety/medical concerns  Assist level: Independent with assistive device Assistive device: Cane-straight    Walk 10 feet on uneven  surface  activity   Assist     Assist level: Independent with assistive device Assistive device: Cane-straight   Wheelchair     Assist Is the patient using a wheelchair?: No Type of Wheelchair: Manual    Wheelchair assist level: Set up assist Max wheelchair distance: 100    Wheelchair 50 feet with 2 turns activity    Assist        Assist Level: Supervision/Verbal cueing   Wheelchair 150 feet activity     Assist      Assist Level: Minimal Assistance - Patient > 75%   Blood pressure 121/75, pulse 89, temperature 98 F (36.7 C), resp. rate 16, height 4' 11"$  (1.499 m), weight 134.9 kg, SpO2 94 %.  Medical Problem List and Plan: 1. Functional deficits secondary to Debility due to respiratory failure, CHF, COPD , Pneumonia               -patient may  shower                      -Cont CIR, PT/OT/SLP D/c home today   2.  Antithrombotics: -DVT/anticoagulation:  Pharmaceutical: Lovenox 62m QD, may d/c if amb >50' at a time with PT              -antiplatelet therapy: N/A 3. Pain Management: Oxycodone 124mq6h prn.  4. Mood/Behavior/Sleep: LCSW to follow for evaluation and support. Zoloft 5049maily             -antipsychotic agents: N/A             --Melatonin 5mg64mS prn insomnia. Prn Trazodone  -Hydroxyzine 10mg68m PRN for itching/anxiety 5. Neuropsych/cognition: This patient is capable of making decisions on his own behalf. 6. Skin/Wound Care: Routine pressure relief measures.  -add juven for nutritional support--not added, weekday team to assess if this is still needed 7. Fluids/Electrolytes/Nutrition:  Monitor I/O. Monitor weekly labs starting 07/10/22 -cont multivitamins 8.Hypercarbic respiratory failure s/p trach:             -Supplemental O2 to maintain O2 sats above 88%  -07/08/22 trach decannulated on 07/07/22, doing well,  9. COPD: Budesonide nebs d/c 02/09 and on duo nebs BID+q4h PRN now. 10. Congestive heart failure: Strict I/O--lasix decreased to 40 mg/daily on 02/13. Daily weight.             --weight at admission 300 lbs-->discharge wt 284 lbs (129.1kg 07/07/22)             --Monitor  for signs of overload.  -07/08/22 weight up a bit, no edema noted, monitor for now -07/09/22 wt up again but suspect it's because it's a bed weight; standing weights done while hospitalized; had standing weight now and is 130.7kg which is much more stable from prior; cont to monitor. No signs of fluid overload.  Filed Weights   07/10/22 0313 07/12/22 0701 07/13/22 0612  Weight: 134.5 kg 131.4 kg 134.9 kg    11. T2DM: hgb A1c-9.0 and poorly controlled.  --continue Insulin Glargine 30 units BID w/SSI meal coverage, 7 units novolog mealtime TID -07/12/22 well controlled, monitor CBG (last 3)  Recent Labs    07/12/22 1646  07/12/22 2005 07/13/22 0558  GLUCAP 140* 153* 79    12. Hypokalemia: Has been supplemented intermittently     Latest Ref Rng & Units 07/12/2022    5:53 AM 07/10/2022    6:24 AM 07/06/2022    6:48 AM  BMP  Glucose 70 -  99 mg/dL 96  95  117   BUN 6 - 20 mg/dL 5  5  6   $ Creatinine 0.44 - 1.00 mg/dL 0.48  0.58  0.48   Sodium 135 - 145 mmol/L 138  138  134   Potassium 3.5 - 5.1 mmol/L 3.5  4.2  3.3   Chloride 98 - 111 mmol/L 101  99  95   CO2 22 - 32 mmol/L 28  25  28   $ Calcium 8.9 - 10.3 mg/dL 9.0  9.1  9.1    K+ normalized , now off KCL but still on Lasix will d/c home on  KCL1 3. Leucocytosis: resolved    Latest Ref Rng & Units 07/10/2022    6:24 AM 07/06/2022    6:48 AM 07/03/2022    3:54 AM  CBC  WBC 4.0 - 10.5 K/uL 6.7  8.6  12.7   Hemoglobin 12.0 - 15.0 g/dL 10.7  11.2  11.2   Hematocrit 36.0 - 46.0 % 36.7  39.8  41.3   Platelets 150 - 400 K/uL 221  255  333     14. Iron deficiency anemia: Hgb stable  15.  Depression: Zoloft 29m daily 16. Dysphagia, Dys 1/thin diet             -Continue SLP 17. Dyslipidemia: Total 134/LDL 83/Trig 1641 (highest) in 05/2022; continue Lipitor 474mQD    LOS: 8 days A FACE TO FACE EVALUATION WAS PERFORMED  AnCharlett Blake/22/2024, 8:24 AM

## 2022-07-13 NOTE — Progress Notes (Signed)
Inpatient Rehabilitation Care Coordinator Discharge Note   Patient Details  Name: GABRIELLIA HOUSTON MRN: [id number] Date of Birth: 1975/12/01   Discharge location: Home with mother. Pt at MOD I level  Length of Stay: 8 Days  Discharge activity level: MOD I  Home/community participation: Mother and father  Patient response [id number] Literacy - How often do you need to have someone help you when you read instructions, pamphlets, or other written material from your doctor or pharmacy?: Never  Patient response [id number] Isolation - How often do you feel lonely or isolated from those around you?: Never  Services provided included: MD, RD, OT, PT, SLP, RN, CM, Pharmacy, TR, Neuropsych, SW  Financial Services:  Charity fundraiser Utilized: Other (Comment) (patient uninsured)    Choices offered to/list presented to: Patient, HEP provided  Follow-up services arranged:  Other (Comment)           Patient response to transportation need: Is the patient able to respond to transportation needs?: Yes In the past 12 months, has lack of transportation kept you from medical appointments or from getting medications?: No In the past 12 months, has lack of transportation kept you from meetings, work, or from getting things needed for daily living?: No    Comments (or additional information):  Patient/Family verbalized understanding of follow-up arrangements:  Yes  Individual responsible for coordination of the follow-up plan: self, [phone]  Confirmed correct DME delivered: [name] 07/13/2022    [name]

## 2022-07-18 IMAGING — CR DG CHEST 2V
1 series · 2 of 2 positions shown · non-contrast
Comparison: Chest radiographs August 01, 2014.

CLINICAL DATA: Upper respiratory infection.  Cough.

EXAM:
CHEST - 2 VIEW

[Series 1: dg chest 2 view · 0.14mm/px · 2 of 2 slices shown]
[im 1/2]
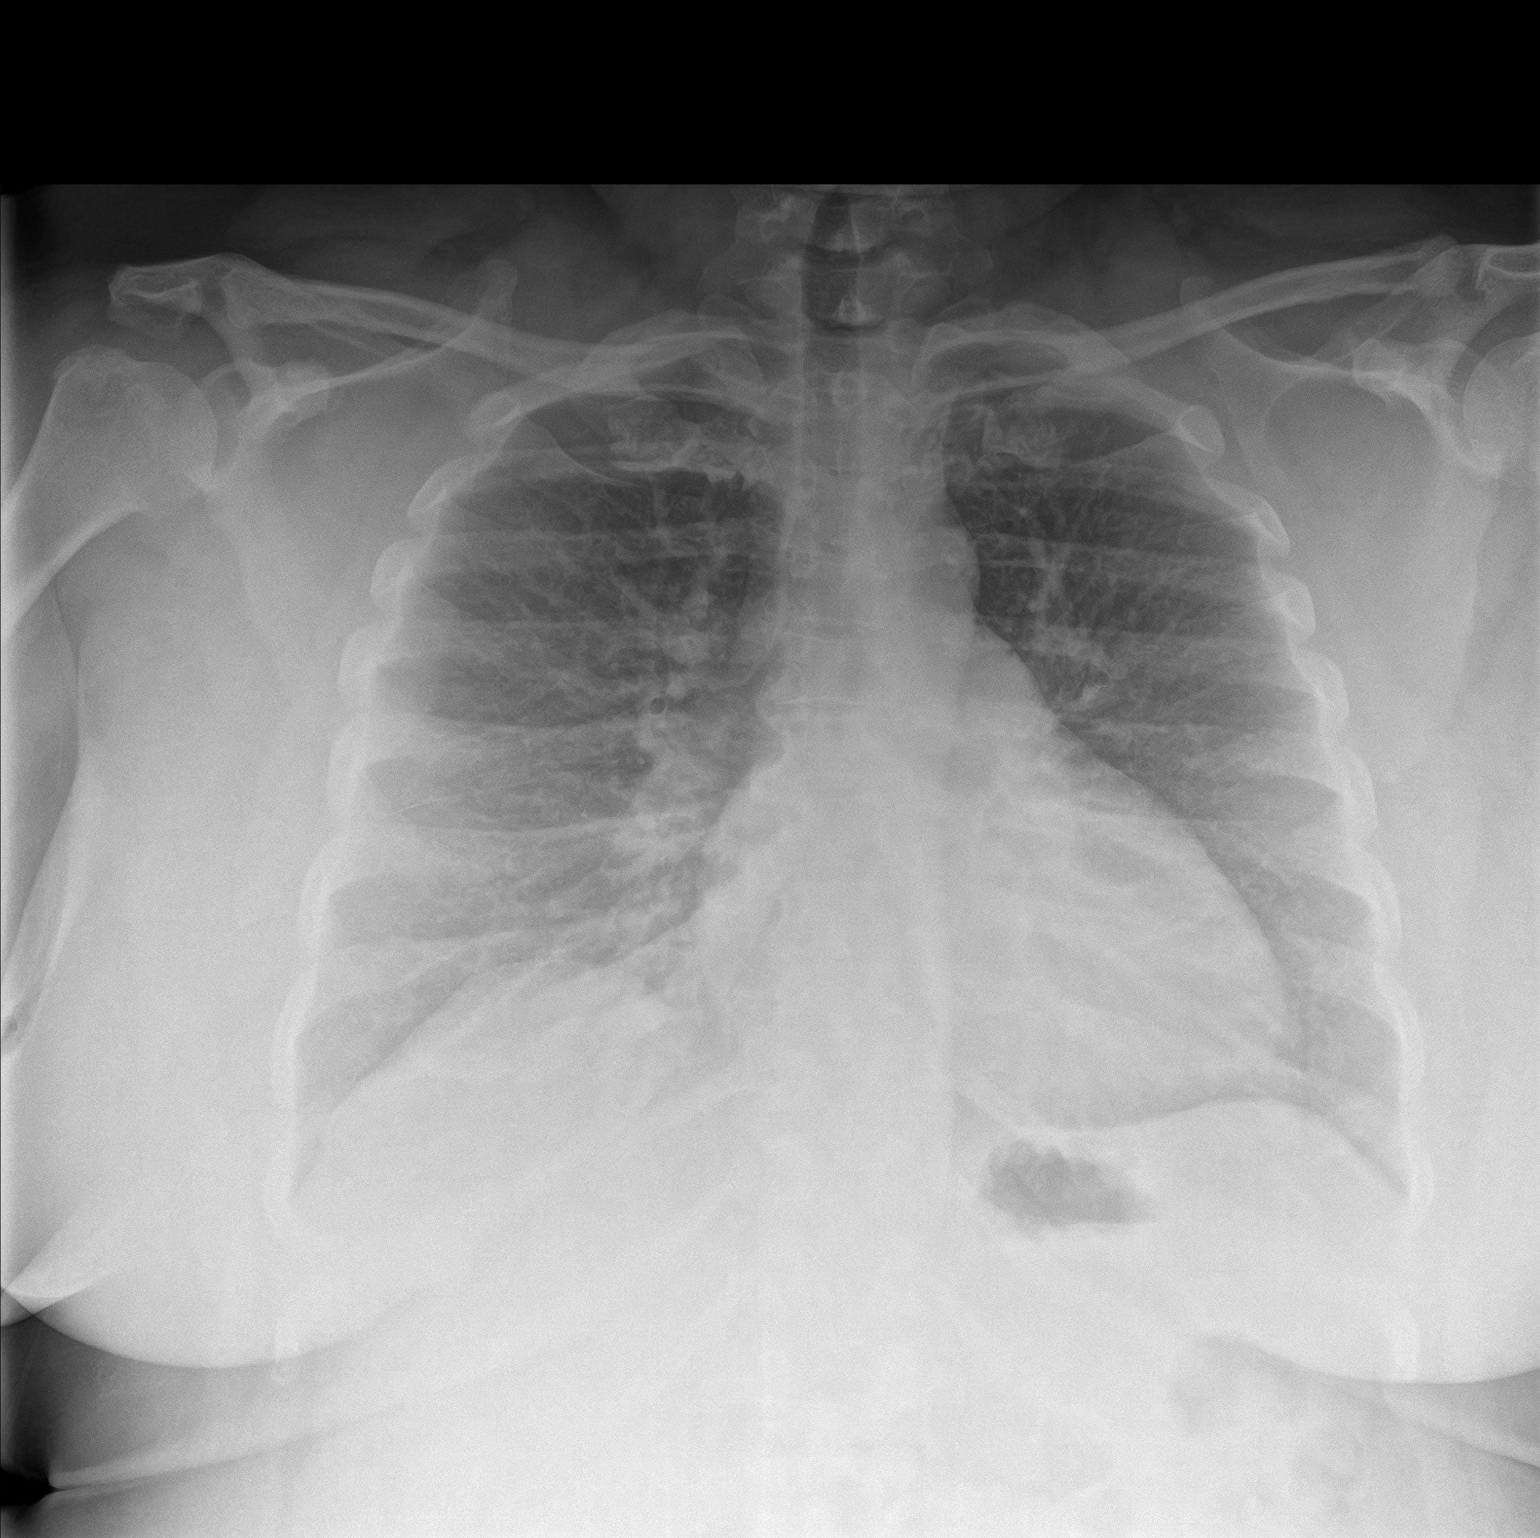
[im 2/2]
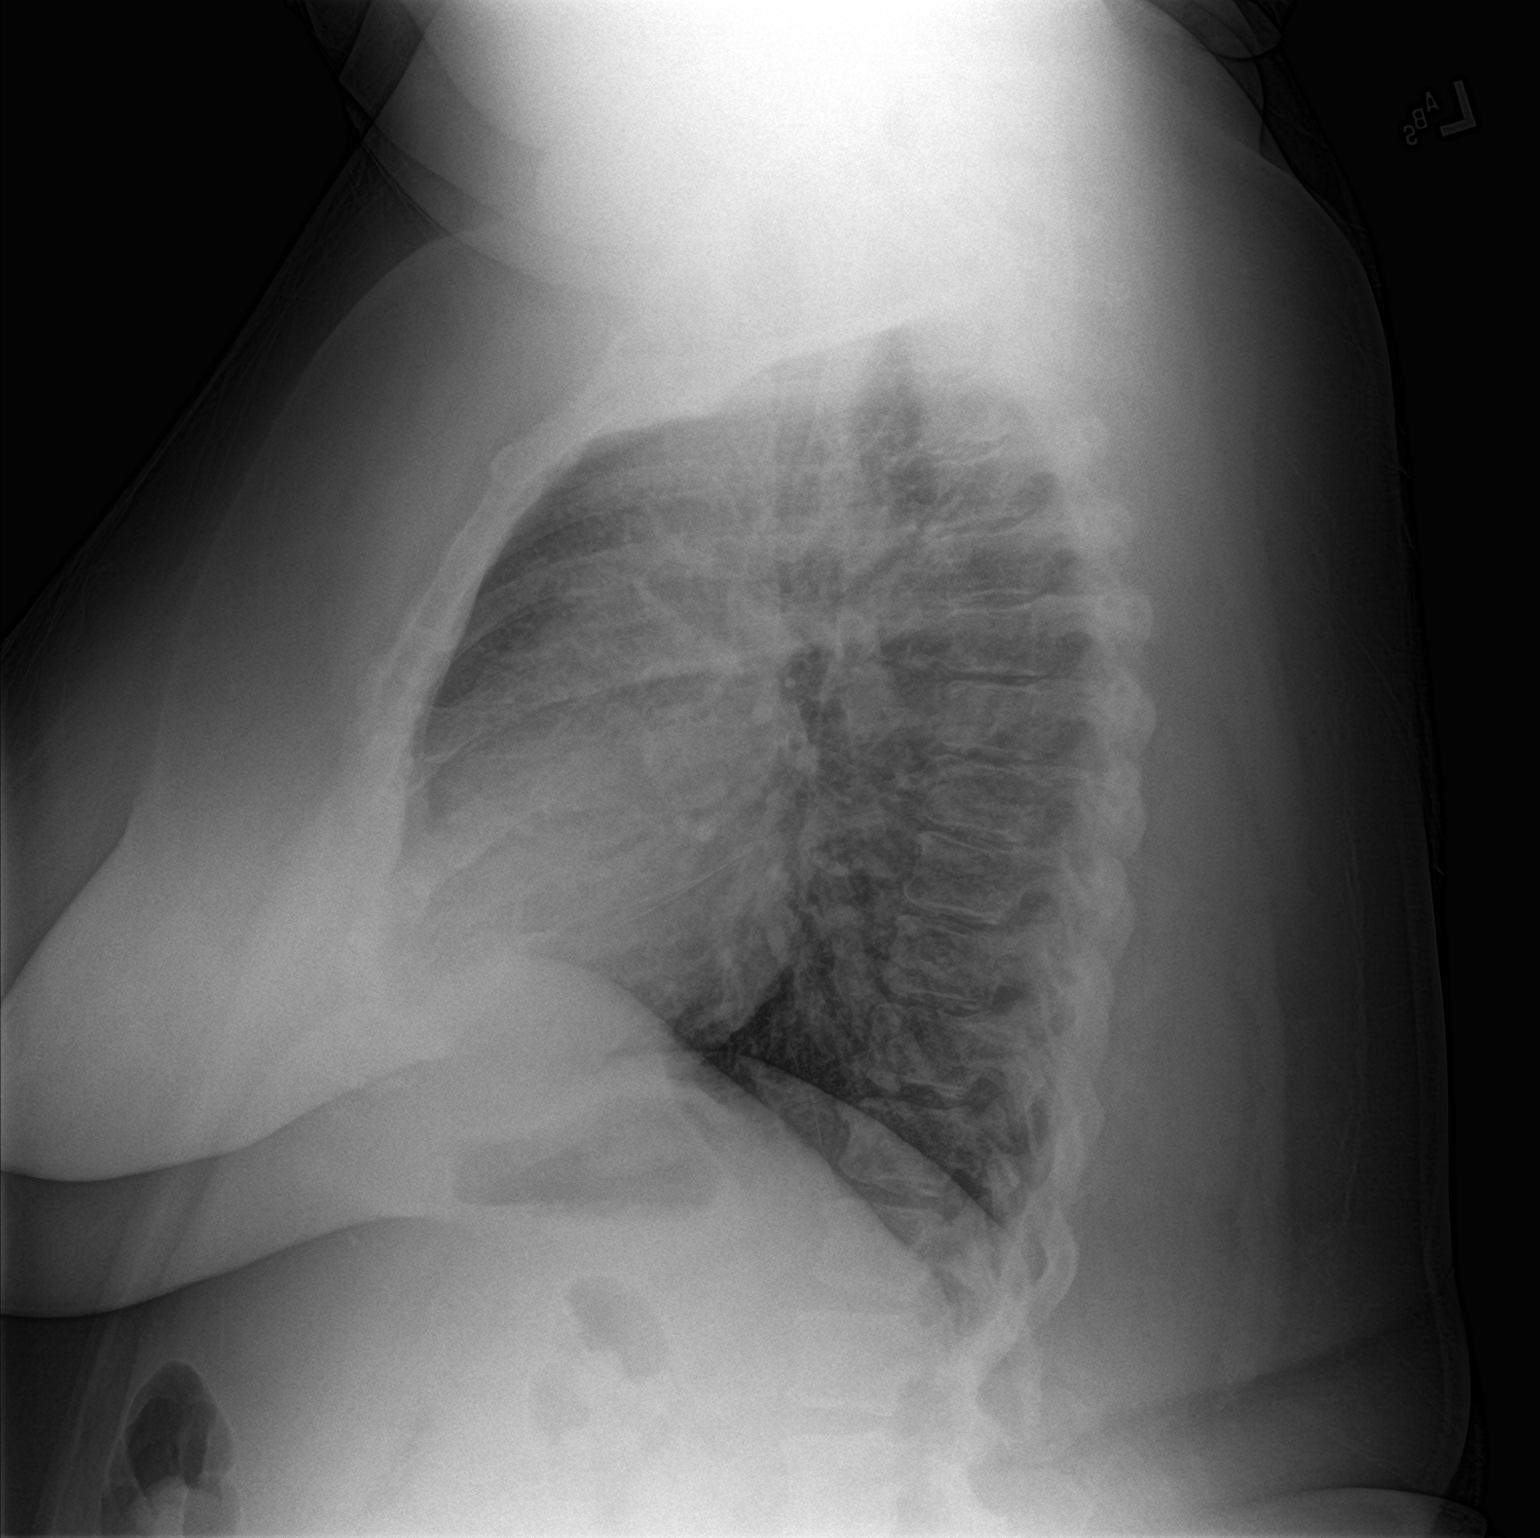

[2 of 2 positions shown; findings below may reference images not displayed]

FINDINGS: Borderline enlarged cardiac silhouette, similar to prior. No
consolidation. No visible pleural effusions or pneumothorax. No
acute osseous abnormality.
IMPRESSION: No acute cardiopulmonary disease.

## 2022-08-11 ENCOUNTER — Other Ambulatory Visit: Payer: Self-pay | Admitting: Internal Medicine

## 2022-08-11 DIAGNOSIS — Z1231 Encounter for screening mammogram for malignant neoplasm of breast: Secondary | ICD-10-CM

## 2022-09-05 ENCOUNTER — Institutional Professional Consult (permissible substitution): Payer: Medicaid Other | Admitting: Pulmonary Disease

## 2022-10-20 ENCOUNTER — Telehealth: Payer: Self-pay

## 2022-10-20 NOTE — Telephone Encounter (Signed)
Lm x2 for the patient.

## 2022-10-20 NOTE — Telephone Encounter (Signed)
Called patient to see if she is currently using a CPAP machine. LVM for her to return my call.

## 2022-10-23 ENCOUNTER — Institutional Professional Consult (permissible substitution): Payer: Medicaid Other | Admitting: Student in an Organized Health Care Education/Training Program

## 2022-10-23 ENCOUNTER — Institutional Professional Consult (permissible substitution): Payer: Medicaid Other | Admitting: Primary Care

## 2022-10-23 NOTE — Progress Notes (Unsigned)
@Patient  ID: Desiree Stone, female    DOB: [birth year], 47 y.o.   MRN: [id number]  No chief complaint on file.   Referring provider: Jacquelynn Cree, PA-C  HPI:   10/23/2022 Patient presents today for sleep consult.  She has had several sleep studies over the past 10 years. Original sleep study was done on 07/11/2015 PSG >> moderate obstructive sleep apnea, AHI 19 (REM AHI 44).  Moderate snoring.  She had CPAP titration study in 2018 requiring pressure settings 13cm h20   Sleep study 07/11/2015 PSG >> moderate obstructive sleep apnea, AHI 19 (REM AHI 44).  Moderate snoring.  10/23/2016 CPAP titration study >> responded well to CPAP with correction of sleep apnea using CPAP at a setting of 12 cm H2O with a Simplus full facemask size small.  Heated humidification.  08/10/2017 CPAP titration>> AHI 11.2 with SpO2 low 89%.  Responded to CPAP pressure 13 cm H2O with mask Simplus size small with heated humidity.  Allergies  Allergen Reactions   Abilify [Aripiprazole] Other (See Comments)    Syncope   Effexor [Venlafaxine] Other (See Comments)    Hair Loss   Wellbutrin [Bupropion] Other (See Comments)    Acne      There is no immunization history on file for this patient.  Past Medical History:  Diagnosis Date   Asthma    Chronic back pain    Diabetes mellitus without complication (HCC)    Hypertension    Iron deficiency anemia    Leiomyoma    s/p uterine artery emoblization   Sleep apnea    Tobacco use disorder     Tobacco History: Social History   Tobacco Use  Smoking Status Former   Packs/day: 0   Types: Cigarettes   Quit date: 04/04/2017   Years since quitting: 5.5  Smokeless Tobacco Never   Counseling given: Not Answered   Outpatient Medications Prior to Visit  Medication Sig Dispense Refill   acetaminophen (TYLENOL) 325 MG tablet Take 1-2 tablets (325-650 mg total) by mouth every 4 (four) hours as needed for mild pain.     albuterol (PROVENTIL  HFA;VENTOLIN HFA) 108 (90 BASE) MCG/ACT inhaler Inhale into the lungs every 6 (six) hours as needed for wheezing or shortness of breath.     atorvastatin (LIPITOR) 40 MG tablet Take 1 tablet (40 mg total) by mouth at bedtime. 30 tablet 0   cetirizine (ZYRTEC) 10 MG tablet Take 10 mg by mouth daily.     cyanocobalamin 1000 MCG tablet Take 1,000 mcg by mouth daily.     famotidine (PEPCID) 20 MG tablet Take 1 tablet (20 mg total) by mouth 2 (two) times daily. 60 tablet 0   fluticasone (FLOVENT HFA) 110 MCG/ACT inhaler Inhale 2 puffs into the lungs 2 (two) times daily. 12 g 0   furosemide (LASIX) 40 MG tablet Take 1 tablet (40 mg total) by mouth daily. 30 tablet 0   hydrOXYzine (ATARAX) 10 MG tablet Take 1 tablet (10 mg total) by mouth 3 (three) times daily as needed for anxiety. 30 tablet 0   insulin aspart (NOVOLOG) 100 UNIT/ML FlexPen Inject 7 Units into the skin 3 (three) times daily with meals. 15 mL 0   insulin glargine (LANTUS) 100 UNIT/ML Solostar Pen Inject 30 Units into the skin 2 (two) times daily. 15 mL 0   Insulin Pen Needle 32G X 4 MM MISC Use 4 (four) times daily with insulin pen. 100 each 0   ipratropium (ATROVENT HFA) 17 MCG/ACT  inhaler Inhale 2 puffs into the lungs every 4 (four) hours as needed for wheezing.     melatonin 5 MG TABS Take 1 tablet (5 mg total) by mouth at bedtime as needed. 30 tablet 0   Multiple Vitamin (MULTIVITAMIN WITH MINERALS) TABS tablet Take 1 tablet by mouth daily. 30 tablet 0   oxyCODONE (OXY IR/ROXICODONE) 5 MG immediate release tablet Take 1 tablet (5 mg total) by mouth 2 (two) times daily as needed for severe pain. 10 tablet 0   potassium chloride (KLOR-CON M) 10 MEQ tablet Take 1 tablet (10 mEq total) by mouth 2 (two) times daily. 60 tablet 0   sertraline (ZOLOFT) 50 MG tablet Take 1 tablet (50 mg total) by mouth daily at 8 pm. 30 tablet 0   traZODone (DESYREL) 50 MG tablet Take 0.5-1 tablets (25-50 mg total) by mouth at bedtime as needed for sleep. 10  tablet 0   No facility-administered medications prior to visit.      Review of Systems  Review of Systems   Physical Exam  There were no vitals taken for this visit. Physical Exam   Lab Results:  CBC    Component Value Date/Time   WBC 6.7 07/10/2022 0624   RBC 4.82 07/10/2022 0624   HGB 10.7 (L) 07/10/2022 0624   HGB 12.6 05/13/2020 1534   HCT 36.7 07/10/2022 0624   HCT 41.7 05/13/2020 1534   PLT 221 07/10/2022 0624   PLT 297 05/13/2020 1534   MCV 76.1 (L) 07/10/2022 0624   MCV 71 (L) 05/13/2020 1534   MCH 22.2 (L) 07/10/2022 0624   MCHC 29.2 (L) 07/10/2022 0624   RDW 27.6 (H) 07/10/2022 0624   RDW 18.1 (H) 05/13/2020 1534   LYMPHSABS 2.1 07/06/2022 0648   LYMPHSABS 3.8 (H) 05/13/2020 1534   MONOABS 0.5 07/06/2022 0648   EOSABS 0.2 07/06/2022 0648   EOSABS 0.1 05/13/2020 1534   BASOSABS 0.1 07/06/2022 0648   BASOSABS 0.1 05/13/2020 1534    BMET    Component Value Date/Time   NA 138 07/12/2022 0553   K 3.5 07/12/2022 0553   CL 101 07/12/2022 0553   CO2 28 07/12/2022 0553   GLUCOSE 96 07/12/2022 0553   BUN <5 (L) 07/12/2022 0553   CREATININE 0.48 07/12/2022 0553   CALCIUM 9.0 07/12/2022 0553   GFRNONAA >60 07/12/2022 0553   GFRAA >60 09/10/2017 1738    BNP    Component Value Date/Time   BNP 103.6 (H) 06/06/2022 1238    ProBNP No results found for: "PROBNP"  Imaging: No results found.   Assessment & Plan:   No problem-specific Assessment & Plan notes found for this encounter.     Glenford Bayley, NP 10/23/2022

## 2022-10-23 NOTE — Telephone Encounter (Signed)
Spoke to patient. She stated that she will bring SD card. Nothing further needed.

## 2022-10-25 ENCOUNTER — Encounter: Payer: Self-pay | Admitting: Primary Care

## 2022-10-25 ENCOUNTER — Ambulatory Visit: Payer: Medicaid Other | Admitting: Primary Care

## 2022-10-25 VITALS — BP 128/82 | HR 76 | Temp 98.1°F | Ht 59.0 in | Wt 285.0 lb

## 2022-10-25 DIAGNOSIS — G4733 Obstructive sleep apnea (adult) (pediatric): Secondary | ICD-10-CM | POA: Diagnosis not present

## 2022-10-25 DIAGNOSIS — J449 Chronic obstructive pulmonary disease, unspecified: Secondary | ICD-10-CM

## 2022-10-25 NOTE — Assessment & Plan Note (Addendum)
-   Current heavy smoker.  Patient has shortness of breath with activity and chronic productive cough.  Currently maintained on Flovent twice daily and as needed Atrovent/albuterol. No recent pulmonary function testing, we will get updated PFTs.  Order placed for patient to receive flutter valve. Smoking cessation encouraged.  Patient to established with Dr. Craige Cotta at follow-up in 3 months.

## 2022-10-25 NOTE — Assessment & Plan Note (Signed)
-   Controlled; PSG 07/11/2015 Moderate obstructive sleep apnea>> AHI 19 (REM AHI 44).  CPAP titration study in 2018 requiring pressure setting 13 cm H2O.  Patient is 80% compliant with CPAP use greater than 4 hours over the last 30 days.  Pressure 13 cm H2O; residual AHI 4.5/hour. Having large amount of air leaks.  Needs replacement headgear/CPAP supplies. FU in 3 months.

## 2022-10-25 NOTE — Progress Notes (Signed)
Reviewed and agree with assessment/plan.   Coralyn Helling, MD [facility] [place] [facility] 10/25/2022, 6:59 PM Pager:  [phone]

## 2022-10-25 NOTE — Progress Notes (Signed)
@Patient  ID: Desiree Stone, female    DOB: 10-11-1975, 47 y.o.   MRN: [id number]  Chief Complaint  Patient presents with   Consult    Last sleeps study was in 2019. No CPAP. Mask and pressure are find.     Referring provider: Jerene Pitch  HPI: 47 year old female, current smoker.  Past medical history significant for hypertension, OSA, COPD/asthma, acute respiratory failure, type 2 diabetes, severe obesity.  10/25/2022 Patient presents today for sleep consult/establish care for OSA.  She has had several sleep studies over the past 10 years. Original sleep study was done on 07/11/2015 PSG >> moderate obstructive sleep apnea, AHI 19 (REM AHI 44).  Moderate snoring. She had CPAP titration study in 2018 requiring pressure settings 13cm h20.  She is 80% compliant with CPAP use greater than 4 hours over the last 30 days.  Pressure 13 cm H2O. She is having a lot of airleaks. Needs replacement headgear. She needs DME order for CPAP supplies.  Patient is a current heavy smoker.  She has a history of COPD.  Has shortness of breath with activity and chronic productive cough.  No recent pulmonary function testing on file.  Social hx: Single. Lives with herself and children. Works as a Runner, broadcasting/film/video She is current 2 pack/day smoker.  Airview download 09/26/2022 - 10/25/2022 Usage days 26/30 days (87%); 24 days (80%) greater than 4 hours Average usage 7 hours 16 minutes Pressure 13 cm H2O Air leaks 102.9 L (95%) AHI 4.5  Sleep study 07/11/2015 PSG >> moderate obstructive sleep apnea, AHI 19 (REM AHI 44).  Moderate snoring.  10/23/2016 CPAP titration study >> responded well to CPAP with correction of sleep apnea using CPAP at a setting of 12 cm H2O with a Simplus full facemask size small.  Heated humidification.  08/10/2017 CPAP titration>> AHI 11.2 with SpO2 low 89%.  Responded to CPAP pressure 13 cm H2O with mask Simplus size small with heated humidity.  Allergies  Allergen Reactions    Abilify [Aripiprazole] Other (See Comments)    Syncope   Effexor [Venlafaxine] Other (See Comments)    Hair Loss   Wellbutrin [Bupropion] Other (See Comments)    Acne     Immunization History  Administered Date(s) Administered   Influenza, Seasonal, Injecte, Preservative Fre 05/13/2003, 05/02/2018   Influenza,inj,Quad PF,6+ Mos 06/14/2016   Influenza-Unspecified 05/02/2018   Td 07/03/1991, 07/30/2003   Tdap 08/16/2010, 08/19/2013    Past Medical History:  Diagnosis Date   Asthma    Chronic back pain    Diabetes mellitus without complication (HCC)    Hypertension    Iron deficiency anemia    Leiomyoma    s/p uterine artery emoblization   Sleep apnea    Tobacco use disorder     Tobacco History: Social History   Tobacco Use  Smoking Status Every Day   Packs/day: 2   Types: Cigarettes  Smokeless Tobacco Never  Tobacco Comments   2 PPD - 10/25/2022 khj   Ready to quit: Not Answered Counseling given: Not Answered Tobacco comments: 2 PPD - 10/25/2022 khj   Outpatient Medications Prior to Visit  Medication Sig Dispense Refill   acetaminophen (TYLENOL) 325 MG tablet Take 1-2 tablets (325-650 mg total) by mouth every 4 (four) hours as needed for mild pain.     albuterol (PROVENTIL HFA;VENTOLIN HFA) 108 (90 BASE) MCG/ACT inhaler Inhale into the lungs every 6 (six) hours as needed for wheezing or shortness of breath.     atorvastatin (  LIPITOR) 40 MG tablet Take 1 tablet (40 mg total) by mouth at bedtime. 30 tablet 0   cetirizine (ZYRTEC) 10 MG tablet Take 10 mg by mouth daily.     cyanocobalamin 1000 MCG tablet Take 1,000 mcg by mouth daily.     enalapril (VASOTEC) 20 MG tablet Take 20 mg by mouth daily.     fluticasone (FLOVENT HFA) 110 MCG/ACT inhaler Inhale 2 puffs into the lungs 2 (two) times daily. 12 g 0   furosemide (LASIX) 40 MG tablet Take 1 tablet (40 mg total) by mouth daily. 30 tablet 0   gabapentin (NEURONTIN) 300 MG capsule Take 300 mg by mouth at bedtime.      hydrOXYzine (ATARAX) 10 MG tablet Take 1 tablet (10 mg total) by mouth 3 (three) times daily as needed for anxiety. 30 tablet 0   insulin glargine (LANTUS) 100 UNIT/ML Solostar Pen Inject 30 Units into the skin 2 (two) times daily. (Patient taking differently: Inject 50 Units into the skin at bedtime.) 15 mL 0   Insulin Pen Needle 32G X 4 MM MISC Use 4 (four) times daily with insulin pen. 100 each 0   ipratropium (ATROVENT HFA) 17 MCG/ACT inhaler Inhale 2 puffs into the lungs every 4 (four) hours as needed for wheezing.     melatonin 5 MG TABS Take 1 tablet (5 mg total) by mouth at bedtime as needed. 30 tablet 0   Multiple Vitamin (MULTIVITAMIN WITH MINERALS) TABS tablet Take 1 tablet by mouth daily. 30 tablet 0   potassium chloride (KLOR-CON) 10 MEQ tablet Take 10 mEq by mouth daily.     sertraline (ZOLOFT) 50 MG tablet Take 1 tablet (50 mg total) by mouth daily at 8 pm. 30 tablet 0   traZODone (DESYREL) 50 MG tablet Take 0.5-1 tablets (25-50 mg total) by mouth at bedtime as needed for sleep. 10 tablet 0   famotidine (PEPCID) 20 MG tablet Take 1 tablet (20 mg total) by mouth 2 (two) times daily. (Patient not taking: Reported on 10/25/2022) 60 tablet 0   insulin aspart (NOVOLOG) 100 UNIT/ML FlexPen Inject 7 Units into the skin 3 (three) times daily with meals. (Patient not taking: Reported on 10/25/2022) 15 mL 0   oxyCODONE (OXY IR/ROXICODONE) 5 MG immediate release tablet Take 1 tablet (5 mg total) by mouth 2 (two) times daily as needed for severe pain. (Patient not taking: Reported on 10/25/2022) 10 tablet 0   potassium chloride (KLOR-CON M) 10 MEQ tablet Take 1 tablet (10 mEq total) by mouth 2 (two) times daily. 60 tablet 0   No facility-administered medications prior to visit.   Review of Systems  Review of Systems  Constitutional: Negative.  Negative for fatigue.  Respiratory:  Positive for cough and shortness of breath.   Psychiatric/Behavioral:  Negative for sleep disturbance.    Physical  Exam  BP 128/82 (BP Location: Left Wrist, Cuff Size: Large)   Pulse 76   Temp 98.1 F (36.7 C)   Ht 4\' 11"  (1.499 m)   Wt 285 lb (129.3 kg)   SpO2 96%   BMI 57.56 kg/m  Physical Exam Constitutional:      General: She is not in acute distress.    Appearance: Normal appearance. She is obese. She is not ill-appearing.  HENT:     Head: Normocephalic and atraumatic.     Mouth/Throat:     Mouth: Mucous membranes are moist.     Pharynx: Oropharynx is clear.  Cardiovascular:     Rate  and Rhythm: Normal rate and regular rhythm.  Pulmonary:     Effort: Pulmonary effort is normal.     Breath sounds: Normal breath sounds. No wheezing or rales.     Comments: O2 96% RA Musculoskeletal:        General: Normal range of motion.     Comments: In transport wheelchair   Skin:    General: Skin is warm and dry.  Neurological:     General: No focal deficit present.     Mental Status: She is alert and oriented to person, place, and time. Mental status is at baseline.  Psychiatric:        Mood and Affect: Mood normal.        Behavior: Behavior normal.        Thought Content: Thought content normal.        Judgment: Judgment normal.      Lab Results:  CBC    Component Value Date/Time   WBC 6.7 07/10/2022 0624   RBC 4.82 07/10/2022 0624   HGB 10.7 (L) 07/10/2022 0624   HGB 12.6 05/13/2020 1534   HCT 36.7 07/10/2022 0624   HCT 41.7 05/13/2020 1534   PLT 221 07/10/2022 0624   PLT 297 05/13/2020 1534   MCV 76.1 (L) 07/10/2022 0624   MCV 71 (L) 05/13/2020 1534   MCH 22.2 (L) 07/10/2022 0624   MCHC 29.2 (L) 07/10/2022 0624   RDW 27.6 (H) 07/10/2022 0624   RDW 18.1 (H) 05/13/2020 1534   LYMPHSABS 2.1 07/06/2022 0648   LYMPHSABS 3.8 (H) 05/13/2020 1534   MONOABS 0.5 07/06/2022 0648   EOSABS 0.2 07/06/2022 0648   EOSABS 0.1 05/13/2020 1534   BASOSABS 0.1 07/06/2022 0648   BASOSABS 0.1 05/13/2020 1534    BMET    Component Value Date/Time   NA 138 07/12/2022 0553   K 3.5  07/12/2022 0553   CL 101 07/12/2022 0553   CO2 28 07/12/2022 0553   GLUCOSE 96 07/12/2022 0553   BUN <5 (L) 07/12/2022 0553   CREATININE 0.48 07/12/2022 0553   CALCIUM 9.0 07/12/2022 0553   GFRNONAA >60 07/12/2022 0553   GFRAA >60 09/10/2017 1738    BNP    Component Value Date/Time   BNP 103.6 (H) 06/06/2022 1238    ProBNP No results found for: "PROBNP"  Imaging: No results found.   Assessment & Plan:   Obstructive sleep apnea - Controlled; PSG 07/11/2015 Moderate obstructive sleep apnea>> AHI 19 (REM AHI 44).  CPAP titration study in 2018 requiring pressure setting 13 cm H2O.  Patient is 80% compliant with CPAP use greater than 4 hours over the last 30 days.  Pressure 13 cm H2O; residual AHI 4.5/hour. Having large amount of air leaks.  Needs replacement headgear/CPAP supplies. FU in 3 months.   COPD (chronic obstructive pulmonary disease) (HCC) - Current heavy smoker.  Patient has shortness of breath with activity and chronic productive cough.  Currently maintained on Flovent twice daily and as needed Atrovent/albuterol. No recent pulmonary function testing, we will get updated PFTs.  Order placed for patient to receive flutter valve. Smoking cessation encouraged.  Patient to established with Dr. Craige Cotta at follow-up in 3 months.   Glenford Bayley, NP 10/25/2022

## 2022-10-25 NOTE — Patient Instructions (Addendum)
Sleep apnea is well-controlled on current CPAP pressure setting, residual apnea less than 5/hour which is good.  No changes to pressure settings recommended at this time.  You are having a large amount of air leaks, this is likely due to mask and headgear fit.  We will place an order for you to start getting CPAP supplies regularly.  Let me know if you do not receive these.  Also let me know if air leaks do not improve.  Recommendations: Continue to wear CPAP nightly  Orders: DME for CPAP supplies  Pulmonary function testing   Follow-up: Patient needs visit with Dr. Craige Cotta in August or September -30 minutes/consult COPD and OSA fu

## 2022-12-22 ENCOUNTER — Ambulatory Visit
Admission: RE | Admit: 2022-12-22 | Discharge: 2022-12-22 | Disposition: A | Discharge status: Home / Self Care | Payer: Medicaid Other | Source: Ambulatory Visit | Attending: Internal Medicine | Admitting: Internal Medicine

## 2022-12-22 DIAGNOSIS — Z1231 Encounter for screening mammogram for malignant neoplasm of breast: Secondary | ICD-10-CM | POA: Diagnosis present

## 2022-12-25 ENCOUNTER — Inpatient Hospital Stay
Admission: RE | Admit: 2022-12-25 | Discharge: 2022-12-25 | Disposition: A | Discharge status: Home / Self Care | Payer: Self-pay | Source: Ambulatory Visit | Attending: Internal Medicine | Admitting: Internal Medicine

## 2022-12-25 ENCOUNTER — Other Ambulatory Visit: Payer: Self-pay | Admitting: *Deleted

## 2022-12-25 DIAGNOSIS — Z1231 Encounter for screening mammogram for malignant neoplasm of breast: Secondary | ICD-10-CM

## 2022-12-26 ENCOUNTER — Encounter (HOSPITAL_BASED_OUTPATIENT_CLINIC_OR_DEPARTMENT_OTHER): Payer: Self-pay | Admitting: Pulmonary Disease

## 2022-12-26 ENCOUNTER — Ambulatory Visit (HOSPITAL_BASED_OUTPATIENT_CLINIC_OR_DEPARTMENT_OTHER): Payer: Medicaid Other | Admitting: Pulmonary Disease

## 2022-12-26 VITALS — BP 141/82 | HR 89 | Resp 14 | Ht 59.0 in | Wt 285.0 lb

## 2022-12-26 DIAGNOSIS — G4733 Obstructive sleep apnea (adult) (pediatric): Secondary | ICD-10-CM

## 2022-12-26 DIAGNOSIS — Z716 Tobacco abuse counseling: Secondary | ICD-10-CM

## 2022-12-26 DIAGNOSIS — F4310 Post-traumatic stress disorder, unspecified: Secondary | ICD-10-CM | POA: Diagnosis not present

## 2022-12-26 DIAGNOSIS — F418 Other specified anxiety disorders: Secondary | ICD-10-CM

## 2022-12-26 MED ORDER — VARENICLINE TARTRATE 1 MG PO TABS: 1.0000 mg | ORAL_TABLET | Freq: Two times a day (BID) | ORAL | 3 refills | Status: DC

## 2022-12-26 MED ORDER — VARENICLINE TARTRATE 0.5 MG PO TABS: ORAL_TABLET | ORAL | 0 refills | Status: AC

## 2022-12-26 NOTE — Patient Instructions (Signed)
Varenicline (chantix) >> 0.5 mg daily for 3 days, then 0.5 mg twice daily for next four days.  Stop smoking after using varenicline for 7 days, and then go up to 1 mg pill twice daily.  Will arrange for new CPAP machine and supplies.  Will refer to behavioral health.  Follow up in 3 months.

## 2022-12-26 NOTE — Progress Notes (Signed)
[facility]  Chief Complaint  Patient presents with   Follow-up    Need new c-pap supplies     Past Surgical History:  She  has a past surgical history that includes carpel tunnel (Bilateral); Esophagogastroduodenoscopy; Colonoscopy with propofol (N/A, 12/08/2014); and Tracheostomy tube placement (N/A, 06/19/2022).  Past Medical History:  Back pain, DM type 2, HTN, IDA  Constitutional:  BP (!) 141/82   Pulse 89   Resp 14   Ht 4\' 11"  (1.499 m)   Wt 285 lb (129.3 kg)   LMP 11/23/2022   SpO2 100%   BMI 57.56 kg/m   Brief Summary:  Desiree Stone is a 47 y.o. female smoker with chronic respiratory failure from sleep disordered breathing and asthma.        Subjective:   She is here with her family.  She was in hospital in January and February of 2024 with respiratory failure.  She required tracheostomy.  She was decannulated in February 2024.    She had a sleep study in 2017 at [facility].  This showed moderate obstructive sleep apnea.  She uses CPAP nightly.  Her current machine is from 2017.  She needs new supplies.  She still smokes.  She used chantix before and this helped.    She has occasional cough and chest congestion.  Using flovent.  Doesn't need albuterol much.  She is still suffering from the trauma of being hospitalized and being on a ventilator.  She had acute delirium while in the hospital and still gets flashbacks to this.  She feels more anxious and depressed.  She is worried about how she will pay her bills, and apparently can't go back to work.  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  Physical Exam:   Appearance - well kempt   ENMT - no sinus tenderness, no oral exudate, no LAN, Mallampati 3 airway, no stridor  Respiratory - equal breath sounds bilaterally, no wheezing or rales  CV - s1s2 regular rate and rhythm, no murmurs  Ext - no  clubbing, no edema  Skin - no rashes  Psych - normal mood and affect   Pulmonary testing:  PFT 10/08/17 >> FEV1 1.64, FEV1% 83, TLC 2.88   Chest Imaging:    Sleep Tests:  PSG 05/09/16 >> AHI 19 CPAP 09/26/22 to 10/25/22 >> used on 26 of 30 nights with average 8 hrs 23 min.  Average AHI 4.5 with CPAP 13 cm H2O  Cardiac Tests:  Echo 06/07/22 >> EF 60 to 65%, mild LVH  Social History:  She  reports that she has been smoking cigarettes. She has never used smokeless tobacco. She reports that she does not drink alcohol and does not use drugs.  Family History:  Her family history includes Breast cancer in her maternal aunt and maternal grandmother; Breast cancer (age of onset: 33) in her paternal aunt; Cancer in her paternal grandmother.     Assessment/Plan:   Obstructive sleep apnea. - she is compliant with CPAP and reports benefit from therapy - her current device is more than 49 years old and she needs a new DME company for supplies - will arrange for a new Resmed CPAP at 13 cm H2O  Chronic bronchitis. - uncertain whether this is all smoking related versus if she has asthma, and whether she actually has COPD - will continue flovent - prn albuterol - defer getting PFT for  now since she seems motivated to quit smoking; would then plan to get PFT after she is off cigarettes  Tobacco abuse. - spent 9 minutes discussing smoking cessation options - she is willing to try varinecline again >> discussed dosing regimen, setting quit date and side effects to monitor for  Anxiety, Depression, PTSD. - will arrange for referral to psychiatry  Time Spent Involved in Patient Care on Day of Examination:  46 minutes  Follow up:   Patient Instructions  Varenicline (chantix) >> 0.5 mg daily for 3 days, then 0.5 mg twice daily for next four days.  Stop smoking after using varenicline for 7 days, and then go up to 1 mg pill twice daily.  Will arrange for new CPAP machine and supplies.  Will  refer to behavioral health.  Follow up in 3 months.  Medication List:   Allergies as of 12/26/2022       Reactions   Abilify [aripiprazole] Other (See Comments)   Syncope   Effexor [venlafaxine] Other (See Comments)   Hair Loss   Wellbutrin [bupropion] Other (See Comments)   Acne        Medication List        Accurate as of December 26, 2022  5:07 PM. If you have any questions, ask your nurse or doctor.          acetaminophen 325 MG tablet Commonly known as: TYLENOL Take 1-2 tablets (325-650 mg total) by mouth every 4 (four) hours as needed for mild pain.   albuterol 108 (90 Base) MCG/ACT inhaler Commonly known as: VENTOLIN HFA Inhale into the lungs every 6 (six) hours as needed for wheezing or shortness of breath.   atorvastatin 40 MG tablet Commonly known as: LIPITOR Take 1 tablet (40 mg total) by mouth at bedtime.   BD Pen Needle Nano U/F 32G X 4 MM Misc Generic drug: Insulin Pen Needle Use 4 (four) times daily with insulin pen.   CertaVite/Antioxidants Tabs Take 1 tablet by mouth daily.   cetirizine 10 MG tablet Commonly known as: ZYRTEC Take 10 mg by mouth daily.   cyanocobalamin 1000 MCG tablet Take 1,000 mcg by mouth daily.   enalapril 20 MG tablet Commonly known as: VASOTEC Take 20 mg by mouth daily.   famotidine 20 MG tablet Commonly known as: PEPCID Take 1 tablet (20 mg total) by mouth 2 (two) times daily.   Flovent HFA 110 MCG/ACT inhaler Generic drug: fluticasone Inhale 2 puffs into the lungs 2 (two) times daily.   furosemide 40 MG tablet Commonly known as: LASIX Take 1 tablet (40 mg total) by mouth daily.   gabapentin 300 MG capsule Commonly known as: NEURONTIN Take 300 mg by mouth at bedtime.   hydrOXYzine 10 MG tablet Commonly known as: ATARAX Take 1 tablet (10 mg total) by mouth 3 (three) times daily as needed for anxiety.   ipratropium 17 MCG/ACT inhaler Commonly known as: ATROVENT HFA Inhale 2 puffs into the lungs every 4  (four) hours as needed for wheezing.   Lantus SoloStar 100 UNIT/ML Solostar Pen Generic drug: insulin glargine Inject 30 Units into the skin 2 (two) times daily. What changed:  how much to take when to take this   melatonin 5 MG Tabs Take 1 tablet (5 mg total) by mouth at bedtime as needed.   NovoLOG FlexPen 100 UNIT/ML FlexPen Generic drug: insulin aspart Inject 7 Units into the skin 3 (three) times daily with meals.   oxyCODONE 5 MG immediate release tablet Commonly  known as: Oxy IR/ROXICODONE Take 1 tablet (5 mg total) by mouth 2 (two) times daily as needed for severe pain.   potassium chloride 10 MEQ tablet Commonly known as: KLOR-CON Take 10 mEq by mouth daily.   sertraline 50 MG tablet Commonly known as: ZOLOFT Take 1 tablet (50 mg total) by mouth daily at 8 pm.   traZODone 50 MG tablet Commonly known as: DESYREL Take 0.5-1 tablets (25-50 mg total) by mouth at bedtime as needed for sleep.   varenicline 0.5 MG tablet Commonly known as: CHANTIX Take 1 tablet (0.5 mg total) by mouth daily in the afternoon for 3 days, THEN 1 tablet (0.5 mg total) 2 (two) times daily for 4 days. Start taking on: December 26, 2022 Started by: Coralyn Helling   varenicline 1 MG tablet Commonly known as: CHANTIX Take 1 tablet (1 mg total) by mouth 2 (two) times daily. Started by: Coralyn Helling        Signature:  Coralyn Helling, MD [facility] Pager - [phone] 12/26/2022, 5:07 PM

## 2023-02-14 ENCOUNTER — Ambulatory Visit (INDEPENDENT_AMBULATORY_CARE_PROVIDER_SITE_OTHER): Payer: Medicaid Other | Admitting: Psychiatry

## 2023-02-14 ENCOUNTER — Encounter: Payer: Self-pay | Admitting: Psychiatry

## 2023-02-14 VITALS — BP 160/88 | HR 88 | Temp 98.0°F | Ht 59.0 in | Wt 284.4 lb

## 2023-02-14 DIAGNOSIS — Z79899 Other long term (current) drug therapy: Secondary | ICD-10-CM | POA: Diagnosis not present

## 2023-02-14 DIAGNOSIS — F4310 Post-traumatic stress disorder, unspecified: Secondary | ICD-10-CM | POA: Insufficient documentation

## 2023-02-14 DIAGNOSIS — F322 Major depressive disorder, single episode, severe without psychotic features: Secondary | ICD-10-CM | POA: Diagnosis not present

## 2023-02-14 DIAGNOSIS — F439 Reaction to severe stress, unspecified: Secondary | ICD-10-CM | POA: Diagnosis not present

## 2023-02-14 MED ORDER — TRAZODONE HCL 50 MG PO TABS: 75.0000 mg | ORAL_TABLET | Freq: Every evening | ORAL | 1 refills | Status: DC | PRN

## 2023-02-14 MED ORDER — SERTRALINE HCL 50 MG PO TABS: 75.0000 mg | ORAL_TABLET | Freq: Every day | ORAL | 0 refills | Status: DC

## 2023-02-14 NOTE — Progress Notes (Unsigned)
Psychiatric Initial Adult Assessment   Patient Identification: Desiree Stone MRN:  [id number] Date of Evaluation:  02/14/2023 Referral Source: Coralyn Helling MD Chief Complaint:   Chief Complaint  Patient presents with   Establish Care   Insomnia   Medication Problem   Depression   Anxiety   Visit Diagnosis:    ICD-10-CM   1. Current severe episode of major depressive disorder without psychotic features without prior episode (HCC)  F32.2 sertraline (ZOLOFT) 50 MG tablet    2. Trauma and stressor-related disorder  F43.9 traZODone (DESYREL) 50 MG tablet    sertraline (ZOLOFT) 50 MG tablet   unspecified, R/O PTSD    3. High risk medication use  Z79.899       History of Present Illness:  Desiree Stone is a 47 year old old African-American female, single, lives in [place], has a history of asthma, chronic respiratory failure, chronic bronchitis, diabetes mellitus, obstructive sleep apnea currently on CPAP, obesity, presented to establish care.  Patient reports she has been struggling with worsening mood symptoms and sleep problems since her recent hospitalization.  Patient was admitted to medical services in January 2024.  Patient was diagnosed with acute respiratory failure as well as congestive heart failure possible on arrival, requiring intubation, extubation failure, requiring tracheostomy on 06/19/2022.  Patient thereafter was admitted to rehabilitation service-07/05/2022 and underwent physical therapy, speech therapy, occupational therapy.  Patient today reports the whole hospital stay was traumatic for her.  She reports she had to repeat herself to talk and walk.  She reports she currently has intrusive memories about her hospital stay.  She reports anything can trigger it including watching her show with her 33-year-old grandchild.  She reports certain sounds, being in unfamiliar places, cause her to have nightmares.  Patient does struggle with sleep on a regular basis.  She has  sleep apnea and reports she has been using CPAP.  She is currently on trazodone however has been taking at 50 mg at bedtime.  That does not seem to help.  She hence has been using 3-4 melatonin 5 mg along with that which also does not seem to help.  Patient does report sadness, crying spells, social withdrawal, racing thoughts about everything that is going on.  She states anxious about her current situational stressors.  She reports she is currently going through a eviction process since she cannot pay her rent.  She is going to live with her dad and her children are going to move in with her mom.  She has has a lot of situational anxiety going on.  Patient also reports she worries a lot about her school and work.  She has not been able to work since her hospitalization in January.  Patient reports although she tried to go back she could not keep up with the kids or lift anything due to her medical issues.  She has numbness and possible weakness of her left lower extremities and currently walks with a cane.  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  She reports when she was admitted to the hospital she could not keep up with school and currently has to repay $9000 before she can go back to complete her semester.  That also worries her.  Patient does report mood lability, however denies any significant manic or hypomanic episodes.  Patient denies any other history of trauma other than the trauma of being hospitalized as noted above.  Patient  denies any suicidality, homicidality or perceptual disturbances.  Patient denies any substance abuse problems.  Patient is currently on sertraline prescribed by her primary provider.  Currently takes 50 mg at bedtime.  She denies side effects however not sure if the medication is affecting her sleep at night.  She does not believe the current dosage is beneficial for her mood  symptoms.      Associated Signs/Symptoms: Depression Symptoms:  depressed mood, anhedonia, insomnia, fatigue, feelings of worthlessness/guilt, difficulty concentrating, decreased appetite, (Hypo) Manic Symptoms:  Labiality of Mood, Anxiety Symptoms:  Excessive Worry, Psychotic Symptoms:   Denies PTSD Symptoms: Had a traumatic exposure:  as noted above Re-experiencing:  Intrusive Thoughts Nightmares Avoidance:  Decreased Interest/Participation Foreshortened Future  Past Psychiatric History: Patient denies inpatient behavioral health admissions in the past.  She may have gone to a therapist several years ago when she was going through some job related stressors.  Patient denies suicide attempts. Past trials of medications like Cymbalta, venlafaxine, Abilify.  Previous Psychotropic Medications: Yes as noted above  Substance Abuse History in the last 12 months:  No.  Consequences of Substance Abuse: Negative  Past Medical History:  Past Medical History:  Diagnosis Date   Asthma    Bipolar disorder (HCC)    Chronic back pain    Diabetes mellitus without complication (HCC)    Hypertension    Iron deficiency anemia    Leiomyoma    s/p uterine artery emoblization   Sleep apnea    Tobacco use disorder     Past Surgical History:  Procedure Laterality Date   carpel tunnel Bilateral    COLONOSCOPY WITH PROPOFOL N/A 12/08/2014   Procedure: COLONOSCOPY WITH PROPOFOL;  Surgeon: Christena Deem, MD;  Location: [facility] ENDOSCOPY;  Service: Endoscopy;  Laterality: N/A;   ESOPHAGOGASTRODUODENOSCOPY     TRACHEOSTOMY TUBE PLACEMENT N/A 06/19/2022   Procedure: TRACHEOSTOMY;  Surgeon: Linus Salmons, MD;  Location: [facility] ORS;  Service: ENT;  Laterality: N/A;    Family Psychiatric History: Unknown , needs to be explored.  Family History:  Family History  Problem Relation Age of Onset   Breast cancer Maternal Aunt    Breast cancer Paternal Aunt 78   Breast cancer Maternal  Grandmother    Cancer Paternal Grandmother        Colon    Social History:   Social History   Socioeconomic History   Marital status: Single    Spouse name: Not on file   Number of children: 2   Years of education: Not on file   Highest education level: Some college, no degree  Occupational History   Not on file  Tobacco Use   Smoking status: Every Day    Current packs/day: 2.00    Types: Cigarettes   Smokeless tobacco: Never   Tobacco comments:    2 PPD - 10/25/2022 khj  Vaping Use   Vaping status: Never Used  Substance and Sexual Activity   Alcohol use: No   Drug use: No   Sexual activity: Not Currently    Birth control/protection: None  Other Topics Concern   Not on file  Social History Narrative   Not on file   Social Determinants of Health   Financial Resource Strain: Not on file  Food Insecurity: No Food Insecurity (06/17/2022)   Hunger Vital Sign    Worried About Running Out of Food in the Last Year: Never true    Ran Out of Food in the Last Year: Never true  Transportation  Needs: No Transportation Needs (06/17/2022)   PRAPARE - Administrator, Civil Service (Medical): No    Lack of Transportation (Non-Medical): No  Physical Activity: Sufficiently Active (05/23/2017)   Exercise Vital Sign    Days of Exercise per Week: 5 days    Minutes of Exercise per Session: 60 min  Stress: No Stress Concern Present (05/23/2017)   [employer] of Occupational Health - Occupational Stress Questionnaire    Feeling of Stress : Not at all  Social Connections: Moderately Isolated (05/23/2017)   Social Connection and Isolation Panel [NHANES]    Frequency of Communication with Friends and Family: More than three times a week    Frequency of Social Gatherings with Friends and Family: Twice a week    Attends Religious Services: Never    Database administrator or Organizations: No    Attends Banker Meetings: Never    Marital Status: Never married     Additional Social History: Patient was born and raised in [place] by both parents.  She has 5 siblings.  Her parents separated when she was around 32 years old.  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  She has 1 more semester to complete.  Patient is single.  She currently lives in [place].  She has a 43 year old daughter and a 20 year old son.  She has a 55-year-old grandson from her 26 year old son.  She takes care of the 49-year-old grandson most of the time since his parents work.  Patient does report trauma as noted above.  Does report a history of legal issues in the past, none pending at this time.  Denies being in [employer].  Reports she is religious.  Allergies:   Allergies  Allergen Reactions   Medroxyprogesterone Other (See Comments)    Sores in the head, back.  Wt. Gain.   Abilify [Aripiprazole] Other (See Comments)    Syncope   Effexor [Venlafaxine] Other (See Comments)    Hair Loss   Wellbutrin [Bupropion] Other (See Comments)    Acne     Metabolic Disorder Labs: Lab Results  Component Value Date   HGBA1C 9.0 (H) 06/07/2022   MPG 211.6 06/07/2022   Lab Results  Component Value Date   PROLACTIN 10.1 05/23/2017   Lab Results  Component Value Date   CHOL 134 06/07/2022   TRIG 103 06/26/2022   HDL 34 (L) 06/07/2022   CHOLHDL 3.9 06/07/2022   VLDL 17 06/07/2022   LDLCALC 83 06/07/2022   Lab Results  Component Value Date   TSH 0.772 06/06/2022    Therapeutic Level Labs: No results found for: "LITHIUM" No results found for: "CBMZ" No results found for: "VALPROATE"  Current Medications: Current Outpatient Medications  Medication Sig Dispense Refill   acetaminophen (TYLENOL) 325 MG tablet Take 1-2 tablets (325-650 mg total) by mouth every 4 (four) hours as needed for mild pain.     albuterol (PROVENTIL HFA;VENTOLIN HFA) 108 (90 BASE)  MCG/ACT inhaler Inhale into the lungs every 6 (six) hours as needed for wheezing or shortness of breath.     albuterol (VENTOLIN HFA) 108 (90 Base) MCG/ACT inhaler Inhale into the lungs every 6 (six) hours as needed for shortness of breath.     atorvastatin (LIPITOR) 40 MG tablet Take 1 tablet (40 mg total) by mouth at bedtime. 30 tablet 0   cetirizine (ZYRTEC) 10 MG tablet  Take 10 mg by mouth daily.     cyanocobalamin 1000 MCG tablet Take 1,000 mcg by mouth daily.     enalapril (VASOTEC) 20 MG tablet Take 20 mg by mouth daily.     famotidine (PEPCID) 20 MG tablet Take 1 tablet (20 mg total) by mouth 2 (two) times daily. 60 tablet 0   fluticasone (FLOVENT HFA) 110 MCG/ACT inhaler Inhale 2 puffs into the lungs 2 (two) times daily. 12 g 0   furosemide (LASIX) 40 MG tablet Take 1 tablet (40 mg total) by mouth daily. 30 tablet 0   gabapentin (NEURONTIN) 300 MG capsule Take 300 mg by mouth at bedtime.     hydrOXYzine (ATARAX) 10 MG tablet Take 1 tablet (10 mg total) by mouth 3 (three) times daily as needed for anxiety. 30 tablet 0   insulin aspart (NOVOLOG) 100 UNIT/ML FlexPen Inject 7 Units into the skin 3 (three) times daily with meals. 15 mL 0   insulin glargine (LANTUS) 100 UNIT/ML Solostar Pen Inject 30 Units into the skin 2 (two) times daily. (Patient taking differently: Inject 50 Units into the skin at bedtime.) 15 mL 0   Insulin Pen Needle 32G X 4 MM MISC Use 4 (four) times daily with insulin pen. 100 each 0   ipratropium (ATROVENT HFA) 17 MCG/ACT inhaler Inhale 2 puffs into the lungs every 4 (four) hours as needed for wheezing.     melatonin 5 MG TABS Take 1 tablet (5 mg total) by mouth at bedtime as needed. 30 tablet 0   Multiple Vitamin (MULTIVITAMIN WITH MINERALS) TABS tablet Take 1 tablet by mouth daily. 30 tablet 0   potassium chloride (KLOR-CON) 10 MEQ tablet Take 10 mEq by mouth daily.     varenicline (CHANTIX) 1 MG tablet Take 1 tablet (1 mg total) by mouth 2 (two) times daily. 60  tablet 3   sertraline (ZOLOFT) 50 MG tablet Take 1.5 tablets (75 mg total) by mouth daily with breakfast. 30 tablet 0   traZODone (DESYREL) 50 MG tablet Take 1.5-2 tablets (75-100 mg total) by mouth at bedtime as needed for sleep. 60 tablet 1   No current facility-administered medications for this visit.    Musculoskeletal: Strength & Muscle Tone: within normal limits Gait & Station: normal Patient leans: N/A  Psychiatric Specialty Exam: Review of Systems  Psychiatric/Behavioral:  Positive for decreased concentration, dysphoric mood and sleep disturbance. The patient is nervous/anxious.     Blood pressure (!) 160/88, pulse 88, temperature 98 F (36.7 C), temperature source Skin, height 4\' 11"  (1.499 m), weight 284 lb 6.4 oz (129 kg).Body mass index is 57.44 kg/m.  General Appearance: Fairly Groomed  Eye Contact:  Fair  Speech:  Clear and Coherent  Volume:  Normal  Mood:  Anxious and Depressed  Affect:  Congruent  Thought Process:  Goal Directed and Descriptions of Associations: Intact  Orientation:  Full (Time, Place, and Person)  Thought Content:  Rumination  Suicidal Thoughts:  No  Homicidal Thoughts:  No  Memory:  Immediate;   Fair Recent;   Fair Remote;   Fair  Judgement:  Fair  Insight:  Fair  Psychomotor Activity:  Normal  Concentration:  Concentration: Fair and Attention Span: Fair  Recall:  [employer] of Knowledge:Fair  Language: Fair  Akathisia:  No  Handed:  Right  AIMS (if indicated):  not done  Assets:  Communication Skills Desire for Improvement Housing Social Support Talents/Skills  ADL's:  Intact  Cognition: WNL  Sleep:  Poor  Screenings: GAD-7    Flowsheet Row Office Visit from [facility]/14/2023 in [facility]  Total GAD-7 Score 10      PHQ2-9    Flowsheet Row Office Visit from 02/14/2023 in [facility]  PHQ-2 Total Score 4  PHQ-9 Total Score 18      Flowsheet  Row Office Visit from 02/14/2023 in [facility] Admission (Discharged) from 07/05/2022 in [facility] Hosp-Admission (Discharged) from 06/06/2022 in [facility]  C-SSRS RISK CATEGORY No Risk No Risk No Risk       Assessment and Plan: ANACELIA MEIKLE is a 47 year old old African-American female, single, lives in [place], has a history of asthma, chronic respiratory failure, chronic bronchitis, diabetes mellitus, obstructive sleep apnea currently on CPAP, obesity, presented to establish care.  Patient with worsening depression, trauma related symptoms as well as sleep issues with multiple medical problems psychosocial stressors including financial stressors, job loss, inability to complete school.  She will benefit from medication management, referral to psychotherapy sessions, plan as noted below. The patient demonstrates the following risk factors for suicide: Chronic risk factors for suicide include: psychiatric disorder of depression . Acute risk factors for suicide include: unemployment and loss (financial, interpersonal, professional). Protective factors for this patient include: positive social support, responsibility to others (children, family), and religious beliefs against suicide. Considering these factors, the overall suicide risk at this point appears to be low. Patient is appropriate for outpatient follow up.  Plan MDD-severe without psychosis-unstable Increase sertraline to 75 mg p.o. daily, change to take with breakfast. Patient to establish care with a therapist-provided resources in the community.  Trauma and stress related disorder-unstable rule out PTSD Increase sertraline as noted above Increase trazodone to 75 mg - 100 mg at bedtime as needed Advised to reduce the amount of melatonin, she could take melatonin 5 mg at bedtime or not use it. Will benefit from  psychotherapy-patient provided resources. Patient encouraged to continue CPAP. Will consider a medication for nightmares as needed in the future.  High risk medication use-will order sodium level, platelet count-patient to go to [facility] lab.   I have reviewed notes per recent admission-Dr.Aiyu-06/06/2022-patient admitted with acute respiratory failure, hypoxia and hyper apnea."  I have reviewed and discussed labs-dated 07/03/2022-CBC-abnormal-platelet normal-333, TSH-0.77/2 - 06/06/2022-within normal limits.  Collaboration of Care: Referral or follow-up with counselor/therapist AEB patient encouraged to establish care with therapist.  Patient/Guardian was advised Release of Information must be obtained prior to any record release in order to collaborate their care with an outside provider. Patient/Guardian was advised if they have not already done so to contact the registration department to sign all necessary forms in order for [place] to release information regarding their care.   Consent: Patient/Guardian gives verbal consent for treatment and assignment of benefits for services provided during this visit. Patient/Guardian expressed understanding and agreed to proceed.    Follow-up in clinic in 3 to 4 weeks or sooner if needed.  I have spent atleast 60 minutes face to face with patient today which includes the time spent for preparing to see the patient ( e.g., review of test, records ), obtaining and to review and separately obtained history , ordering medications and test ,psychoeducation and supportive psychotherapy and care coordination,as well as documenting clinical information in electronic health record,interpreting and communication of test results   This note was generated in part or whole  with voice recognition software. Voice recognition is usually quite accurate but there are transcription errors that can and very often do occur. I apologize for any typographical errors that were not  detected and corrected.    Jomarie Longs, MD 9/25/20243:41 PM

## 2023-02-14 NOTE — Patient Instructions (Addendum)
  [email or web address]  www.psychologytoday  Phyllis Ginger - [phone] please call for therapy [facility]  [email or web address] [facility] [address], [place], [place] [address]   [phone]  [facility] [email or web address] [address], [place], [place] [address]  [phone]  [facility] [email or web address] [address], [place], [place] [address]  [phone]   [facility] - [phone]  [facility] - [phone]  [facility] - [phone] counseling [phone]  [facility] [phone]   [email or web address] this clinician can offer telehealth and has a sliding scale option  [email or web address] this group also offers sliding scale rates and is based out of [place]  Dr. Liborio Nixon with the [facility] specializes in divorce  [employer] [facility] has interns who offer sliding scale rates and some of the full time clinicians do, as well. You complete their contact form on their website and the referrals coordinator will help to get connected to someone   Medicaid below :  [facility] [address] [place], [place] [address]  [phone]    [employer] [address] [place], [place] [address]  [phone]    [facility] [address] [place], [place] [address]  [phone]

## 2023-03-12 ENCOUNTER — Ambulatory Visit (INDEPENDENT_AMBULATORY_CARE_PROVIDER_SITE_OTHER): Payer: Medicaid Other | Admitting: Psychiatry

## 2023-03-12 ENCOUNTER — Telehealth: Payer: Self-pay | Admitting: Psychiatry

## 2023-03-12 DIAGNOSIS — F322 Major depressive disorder, single episode, severe without psychotic features: Secondary | ICD-10-CM

## 2023-03-12 NOTE — Telephone Encounter (Signed)
Patient could not come to appointment today. She rescheduled but states she needs the lab orders put back in the system. She forgot to go have done. States she will go before her next appointment on 03/20/23

## 2023-03-13 ENCOUNTER — Ambulatory Visit: Payer: Medicaid Other | Admitting: Psychiatry

## 2023-03-13 NOTE — Progress Notes (Signed)
Patient no showed this appointment.

## 2023-03-13 NOTE — Telephone Encounter (Signed)
Labs ordered already in the system.

## 2023-03-14 NOTE — Telephone Encounter (Signed)
Pt was notified.  

## 2023-03-20 ENCOUNTER — Encounter: Payer: Self-pay | Admitting: Psychiatry

## 2023-03-20 ENCOUNTER — Ambulatory Visit: Payer: Medicaid Other | Admitting: Psychiatry

## 2023-03-20 VITALS — BP 122/84 | HR 76 | Temp 97.7°F | Ht 59.0 in | Wt 289.8 lb

## 2023-03-20 DIAGNOSIS — F322 Major depressive disorder, single episode, severe without psychotic features: Secondary | ICD-10-CM

## 2023-03-20 DIAGNOSIS — Z79899 Other long term (current) drug therapy: Secondary | ICD-10-CM

## 2023-03-20 DIAGNOSIS — F4310 Post-traumatic stress disorder, unspecified: Secondary | ICD-10-CM

## 2023-03-20 MED ORDER — SERTRALINE HCL 100 MG PO TABS: 100.0000 mg | ORAL_TABLET | Freq: Every day | ORAL | 0 refills | Status: DC

## 2023-03-20 NOTE — Patient Instructions (Signed)
[address]  [place], [place] [address]  Email: [email or web address]  Tel: [phone]

## 2023-03-20 NOTE — Progress Notes (Signed)
BH MD OP Progress Note  03/20/2023 2:26 PM Desiree Stone  MRN:  [id number]  Chief Complaint:  Chief Complaint  Patient presents with   Follow-up   Depression   Anxiety   Medication Refill   Insomnia   HPI: Desiree Stone is a 47 year old African-American female, single, lives in [place], has a history of MDD, PTSD, asthma, chronic respiratory failure, chronic bronchitis, diabetes mellitus, obstructive sleep apnea on CPAP, obesity, history of respiratory failure, congestive heart failure requiring intubation, tracheostomy-06/19/2022, was evaluated in office today.  Patient presented for follow-up.  Patient today reports she moved in with her father.  She reports she does feel sad about it at times since she is a grown adult and the fact that she had to moving back with her dad.  She however reports her mood symptoms may have improved to some extent on the higher dosage of Zoloft.  Denies side effects.  Patient continues to have intrusive memories about her past history of trauma.  Patient describes her trauma as her having to spend time at the Hospital for her recent respiratory failure which required intubation and tracheostomy-in January 2024.  Patient reports she is interested in finding a therapist to start trauma focused therapy however contacted everybody on the list that was provided last visit and has been unable to find a therapist yet.  Patient reports sleep has improved.  Denies any suicidality, homicidality or perceptual disturbances.  Patient reports she is currently trying to cut back on her smoking.  Patient denies any other concerns today.  Visit Diagnosis:    ICD-10-CM   1. Current severe episode of major depressive disorder without psychotic features without prior episode (HCC)  F32.2 sertraline (ZOLOFT) 100 MG tablet    2. PTSD (post-traumatic stress disorder)  F43.10 sertraline (ZOLOFT) 100 MG tablet    3. High risk medication use  Z79.899       Past  Psychiatric History: I have reviewed past psychiatric history from progress note on 02/14/2023.  Past trials of Cymbalta, venlafaxine, Abilify.  Past Medical History:  Past Medical History:  Diagnosis Date   Asthma    Bipolar disorder (HCC)    Chronic back pain    Diabetes mellitus without complication (HCC)    Hypertension    Iron deficiency anemia    Leiomyoma    s/p uterine artery emoblization   Sleep apnea    Tobacco use disorder     Past Surgical History:  Procedure Laterality Date   carpel tunnel Bilateral    COLONOSCOPY WITH PROPOFOL N/A 12/08/2014   Procedure: COLONOSCOPY WITH PROPOFOL;  Surgeon: Christena Deem, MD;  Location: [facility] ENDOSCOPY;  Service: Endoscopy;  Laterality: N/A;   ESOPHAGOGASTRODUODENOSCOPY     TRACHEOSTOMY TUBE PLACEMENT N/A 06/19/2022   Procedure: TRACHEOSTOMY;  Surgeon: Linus Salmons, MD;  Location: [facility] ORS;  Service: ENT;  Laterality: N/A;    Family Psychiatric History: I have reviewed family psychiatric history from progress note on 02/14/2023.  Family History:  Family History  Problem Relation Age of Onset   Breast cancer Maternal Aunt    Breast cancer Paternal Aunt 69   Breast cancer Maternal Grandmother    Cancer Paternal Grandmother        Colon    Social History: I have reviewed social history from progress note on 02/14/2023. Social History   Socioeconomic History   Marital status: Single    Spouse name: Not on file   Number of children: 2   Years of  education: Not on file   Highest education level: Some college, no degree  Occupational History   Not on file  Tobacco Use   Smoking status: Every Day    Current packs/day: 2.00    Types: Cigarettes   Smokeless tobacco: Never   Tobacco comments:    2 PPD - 10/25/2022 khj  Vaping Use   Vaping status: Never Used  Substance and Sexual Activity   Alcohol use: No   Drug use: No   Sexual activity: Not Currently    Birth control/protection: None  Other Topics Concern   Not  on file  Social History Narrative   Not on file   Social Determinants of Health   Financial Resource Strain: Not on file  Food Insecurity: No Food Insecurity (06/17/2022)   Hunger Vital Sign    Worried About Running Out of Food in the Last Year: Never true    Ran Out of Food in the Last Year: Never true  Transportation Needs: No Transportation Needs (06/17/2022)   PRAPARE - Administrator, Civil Service (Medical): No    Lack of Transportation (Non-Medical): No  Physical Activity: Sufficiently Active (05/23/2017)   Exercise Vital Sign    Days of Exercise per Week: 5 days    Minutes of Exercise per Session: 60 min  Stress: No Stress Concern Present (05/23/2017)   [employer] of Occupational Health - Occupational Stress Questionnaire    Feeling of Stress : Not at all  Social Connections: Moderately Isolated (05/23/2017)   Social Connection and Isolation Panel [NHANES]    Frequency of Communication with Friends and Family: More than three times a week    Frequency of Social Gatherings with Friends and Family: Twice a week    Attends Religious Services: Never    Database administrator or Organizations: No    Attends Banker Meetings: Never    Marital Status: Never married    Allergies:  Allergies  Allergen Reactions   Medroxyprogesterone Other (See Comments)    Sores in the head, back.  Wt. Gain.   Abilify [Aripiprazole] Other (See Comments)    Syncope   Effexor [Venlafaxine] Other (See Comments)    Hair Loss   Wellbutrin [Bupropion] Other (See Comments)    Acne     Metabolic Disorder Labs: Lab Results  Component Value Date   HGBA1C 9.0 (H) 06/07/2022   MPG 211.6 06/07/2022   Lab Results  Component Value Date   PROLACTIN 10.1 05/23/2017   Lab Results  Component Value Date   CHOL 134 06/07/2022   TRIG 103 06/26/2022   HDL 34 (L) 06/07/2022   CHOLHDL 3.9 06/07/2022   VLDL 17 06/07/2022   LDLCALC 83 06/07/2022   Lab Results  Component  Value Date   TSH 0.772 06/06/2022   TSH 1.170 05/23/2017    Therapeutic Level Labs: No results found for: "LITHIUM" No results found for: "VALPROATE" No results found for: "CBMZ"  Current Medications: Current Outpatient Medications  Medication Sig Dispense Refill   acetaminophen (TYLENOL) 325 MG tablet Take 1-2 tablets (325-650 mg total) by mouth every 4 (four) hours as needed for mild pain.     albuterol (PROVENTIL HFA;VENTOLIN HFA) 108 (90 BASE) MCG/ACT inhaler Inhale into the lungs every 6 (six) hours as needed for wheezing or shortness of breath.     albuterol (VENTOLIN HFA) 108 (90 Base) MCG/ACT inhaler Inhale into the lungs every 6 (six) hours as needed for shortness of breath.  atorvastatin (LIPITOR) 40 MG tablet Take 1 tablet (40 mg total) by mouth at bedtime. 30 tablet 0   cetirizine (ZYRTEC) 10 MG tablet Take 10 mg by mouth daily.     cyanocobalamin 1000 MCG tablet Take 1,000 mcg by mouth daily.     enalapril (VASOTEC) 20 MG tablet Take 20 mg by mouth 2 (two) times daily at 10 AM and 5 PM.     famotidine (PEPCID) 20 MG tablet Take 1 tablet (20 mg total) by mouth 2 (two) times daily. 60 tablet 0   fluticasone (FLOVENT HFA) 110 MCG/ACT inhaler Inhale 2 puffs into the lungs 2 (two) times daily. 12 g 0   furosemide (LASIX) 40 MG tablet Take 1 tablet (40 mg total) by mouth daily. 30 tablet 0   gabapentin (NEURONTIN) 300 MG capsule Take 300 mg by mouth 2 (two) times daily at 10 AM and 5 PM.     hydrOXYzine (ATARAX) 10 MG tablet Take 1 tablet (10 mg total) by mouth 3 (three) times daily as needed for anxiety. 30 tablet 0   insulin aspart (NOVOLOG) 100 UNIT/ML FlexPen Inject 7 Units into the skin 3 (three) times daily with meals. 15 mL 0   insulin glargine (LANTUS) 100 UNIT/ML Solostar Pen Inject 30 Units into the skin 2 (two) times daily. (Patient taking differently: Inject 50 Units into the skin at bedtime.) 15 mL 0   Insulin Pen Needle 32G X 4 MM MISC Use 4 (four) times daily with  insulin pen. 100 each 0   ipratropium (ATROVENT HFA) 17 MCG/ACT inhaler Inhale 2 puffs into the lungs every 4 (four) hours as needed for wheezing.     KLOR-CON M10 10 MEQ tablet Take 10 mEq by mouth daily.     melatonin 5 MG TABS Take 1 tablet (5 mg total) by mouth at bedtime as needed. 30 tablet 0   Multiple Vitamin (MULTIVITAMIN WITH MINERALS) TABS tablet Take 1 tablet by mouth daily. 30 tablet 0   potassium chloride (KLOR-CON) 10 MEQ tablet Take 10 mEq by mouth daily.     traZODone (DESYREL) 50 MG tablet Take 1.5-2 tablets (75-100 mg total) by mouth at bedtime as needed for sleep. 60 tablet 1   varenicline (CHANTIX) 1 MG tablet Take 1 tablet (1 mg total) by mouth 2 (two) times daily. 60 tablet 3   sertraline (ZOLOFT) 100 MG tablet Take 1 tablet (100 mg total) by mouth daily with breakfast. 90 tablet 0   No current facility-administered medications for this visit.     Musculoskeletal: Strength & Muscle Tone: within normal limits Gait & Station: normal Patient leans: N/A  Psychiatric Specialty Exam: Review of Systems  Psychiatric/Behavioral:  Positive for dysphoric mood and sleep disturbance. The patient is nervous/anxious.     Blood pressure 122/84, pulse 76, temperature 97.7 F (36.5 C), temperature source Skin, height 4\' 11"  (1.499 m), weight 289 lb 12.8 oz (131.5 kg).Body mass index is 58.53 kg/m.  General Appearance: Fairly Groomed  Eye Contact:  Fair  Speech:  Clear and Coherent  Volume:  Normal  Mood:  Anxious and Depressed improving  Affect:  Congruent  Thought Process:  Goal Directed and Descriptions of Associations: Intact  Orientation:  Full (Time, Place, and Person)  Thought Content: Logical   Suicidal Thoughts:  No  Homicidal Thoughts:  No  Memory:  Immediate;   Fair Recent;   Fair Remote;   Fair  Judgement:  Fair  Insight:  Fair  Psychomotor Activity:  Normal  Concentration:  Concentration: Fair and Attention Span: Fair  Recall:  [employer] of Knowledge:  Fair  Language: Fair  Akathisia:  No  Handed:  Right  AIMS (if indicated): not done  Assets:  Communication Skills Desire for Improvement Housing Social Support  ADL's:  Intact  Cognition: WNL  Sleep:   improving   Screenings: GAD-7    Flowsheet Row Office Visit from 03/20/2023 in [facility] Office Visit from 02/14/2023 in [facility]  Total GAD-7 Score 13 10      PHQ2-9    Flowsheet Row Office Visit from 03/20/2023 in [facility] Office Visit from 02/14/2023 in [facility]  PHQ-2 Total Score 4 4  PHQ-9 Total Score 12 18      Flowsheet Row Office Visit from 03/20/2023 in [facility] Office Visit from 02/14/2023 in [facility] Admission (Discharged) from 07/05/2022 in [facility]  C-SSRS RISK CATEGORY No Risk No Risk No Risk        Assessment and Plan: RIETTA ELSNER is a 47 year old African-American female, single, lives in [place], has a history of MDD, PTSD, multiple medical problems including sleep apnea on CPAP, continues to have mood symptoms, trauma related symptoms although with some improvement on the Zoloft, will benefit from medication readjustment and psychotherapy sessions, plan as noted below.  Plan  MDD-improving Increase sertraline to 100 mg p.o. daily Patient encouraged to establish care with therapist-provided resources for [facility] counseling and [facility].  PTSD-unstable Increase sertraline to 100 mg p.o. daily Trazodone 75 mg - 100 mg at bedtime as needed Patient could also use melatonin 5 mg at bedtime as needed  High risk medication use-I have reviewed labs dated 03/13/2023-discussed with patient-sodium-140-within normal limits, BUN, creatinine-within normal limits, hemoglobin  A1c-7-elevated-patient to follow up with primary care provider, lipid panel-within normal limits except for HDL low at 34.  Pending labs-platelet count-patient agrees to get it done.    Collaboration of Care: Collaboration of Care: Referral or follow-up with counselor/therapist AEB patient Desiree Stone to establish care with therapist.  Patient/Guardian was advised Release of Information must be obtained prior to any record release in order to collaborate their care with an outside provider. Patient/Guardian was advised if they have not already done so to contact the registration department to sign all necessary forms in order for [place] to release information regarding their care.   Consent: Patient/Guardian gives verbal consent for treatment and assignment of benefits for services provided during this visit. Patient/Guardian expressed understanding and agreed to proceed.  Follow-up in clinic in 2 months or sooner if needed.  This note was generated in part or whole with voice recognition software. Voice recognition is usually quite accurate but there are transcription errors that can and very often do occur. I apologize for any typographical errors that were not detected and corrected.     Jomarie Longs, MD 03/20/2023, 2:26 PM

## 2023-04-12 ENCOUNTER — Other Ambulatory Visit: Payer: Self-pay | Admitting: Psychiatry

## 2023-04-12 DIAGNOSIS — F322 Major depressive disorder, single episode, severe without psychotic features: Secondary | ICD-10-CM

## 2023-04-12 DIAGNOSIS — F4310 Post-traumatic stress disorder, unspecified: Secondary | ICD-10-CM

## 2023-05-02 ENCOUNTER — Ambulatory Visit (INDEPENDENT_AMBULATORY_CARE_PROVIDER_SITE_OTHER): Payer: Medicaid Other | Admitting: Professional Counselor

## 2023-05-02 DIAGNOSIS — Z91199 Patient's noncompliance with other medical treatment and regimen due to unspecified reason: Secondary | ICD-10-CM

## 2023-05-02 NOTE — Progress Notes (Signed)
Patient no-showed today's appointment; appointment was for 05/02/23 at 8 AM to complete CCA to establish care in outpatient therapy.

## 2023-05-09 ENCOUNTER — Ambulatory Visit: Payer: Medicaid Other | Admitting: Psychiatry

## 2023-05-24 ENCOUNTER — Ambulatory Visit: Payer: Medicaid Other | Admitting: Professional Counselor

## 2023-05-24 ENCOUNTER — Other Ambulatory Visit: Payer: Self-pay | Admitting: Psychiatry

## 2023-05-24 DIAGNOSIS — F439 Reaction to severe stress, unspecified: Secondary | ICD-10-CM
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
## 2023-05-31 ENCOUNTER — Ambulatory Visit: Payer: Medicaid Other | Admitting: Psychiatry

## 2023-06-06 ENCOUNTER — Ambulatory Visit (INDEPENDENT_AMBULATORY_CARE_PROVIDER_SITE_OTHER): Payer: Medicaid Other | Admitting: Professional Counselor

## 2023-06-06 DIAGNOSIS — F4310 Post-traumatic stress disorder, unspecified: Secondary | ICD-10-CM

## 2023-06-06 NOTE — Progress Notes (Signed)
   THERAPIST PROGRESS NOTE  Session Time: 2:05 PM - 2:45 PM  Participation Level: Active  Behavioral Response: Casual, Alert, Euthymic  Type of Therapy: Individual Therapy  Treatment Goals addressed: Active  BH CCP Acute or Chronic Trauma Reaction  LTG: "My goal is to be able to see medical practitioners without having anxiety to be able to have meaningful care. All appointments freak me out. Whether it's you or somebody else. I think they're going to tell me there's something wrong."     Start:  06/06/23    Expected End:  06/04/24     STG: Report a decrease in PTSD symptoms as evidenced by a 25% reduction in overall score on a clinician administered PTSD assessment screen/scale   STG: "I've gotten this habit of coughing and spitting to check it to see if there's something wrong." To reduce compulsive behaviors and attached anxiety AEB exposure exercises and grounding techniques over the next 12 weeks.    STG: Angelicia will verbalize an increased sense of mastery over PTSD symptoms by using several techniques to cope with flashbacks, decrease the power of triggers, and decrease negative thinking   ProgressTowards Goals: Initial  Interventions: CBT  Summary: MADELON PERT is a 48 y.o. female who presents with MDD and trauma/stressor-related disorder. Quinnie appeared alert and oriented x5. She reported she and family have been sick since last session. She discussed how being sick triggers her anxiety and trauma symptoms. Khori engaged in developing her treatment plan. She was in agreement for structured trauma therapy. Jeniffer actively listened to psychoeducation and expressed understanding of homework assignment/impact statement. She is aware she can switch her appointments to virtual if needed.   Therapist Response: Conducted session with Eveleen Hinds. Began session with check-in/update since previous session. Utilized empathetic and reflective listening. Developed treatment  plan with input from [place] on current strengths, needs, and progress towards goals. Provided psychoeducation on PTSD, processed Temisha's trauma symptoms, and explained stuck points using examples. Assigned homework to complete impact statement before next session. Highlighted importance of handwriting statement and avoid avoiding the assignment. Scheduled next session and concluded session.   Suicidal/Homicidal: No  Plan: Return again in 2 weeks.  Diagnosis: PTSD (post-traumatic stress disorder)  Collaboration of Care: Medication Management AEB chart review  Patient/Guardian was advised Release of Information must be obtained prior to any record release in order to collaborate their care with an outside provider. Patient/Guardian was advised if they have not already done so to contact the registration department to sign all necessary forms in order for us  to release information regarding their care.   Consent: Patient/Guardian gives verbal consent for treatment and assignment of benefits for services provided during this visit. Patient/Guardian expressed understanding and agreed to proceed.   Len Quale, [facility] 06/06/2023

## 2023-06-20 ENCOUNTER — Ambulatory Visit: Payer: Medicaid Other | Admitting: Professional Counselor

## 2023-07-04 ENCOUNTER — Ambulatory Visit (INDEPENDENT_AMBULATORY_CARE_PROVIDER_SITE_OTHER): Payer: Self-pay | Admitting: Professional Counselor

## 2023-07-04 DIAGNOSIS — F4310 Post-traumatic stress disorder, unspecified: Secondary | ICD-10-CM | POA: Diagnosis not present

## 2023-07-04 NOTE — Progress Notes (Unsigned)
   THERAPIST PROGRESS NOTE  Session Time: 2:00 PM - 2:58 PM   Participation Level: {BHH PARTICIPATION LEVEL:22264}  Behavioral Response: {Appearance:22683}{BHH LEVEL OF CONSCIOUSNESS:22305}{BHH MOOD:22306}  Type of Therapy: {CHL AMB BH Type of Therapy:21022741}  Treatment Goals addressed: ***  ProgressTowards Goals: {Progress Towards Goals:21014066}  Interventions: {CHL AMB BH Type of Intervention:21022753}  Summary: Desiree Stone is a 48 y.o. female who presents with ***.   Suicidal/Homicidal: {BHH YES OR NO:22294}{yes/no/with/without intent/plan:22693}  Therapist Response: Update, impact statement, stuck points, abc worksheets  Plan: Return again in *** weeks.  Diagnosis: PTSD (post-traumatic stress disorder)  Collaboration of Care: {BH OP Collaboration of Care:21014065}  Patient/Guardian was advised Release of Information must be obtained prior to any record release in order to collaborate their care with an outside provider. Patient/Guardian was advised if they have not already done so to contact the registration department to sign all necessary forms in order for [place] to release information regarding their care.   Consent: Patient/Guardian gives verbal consent for treatment and assignment of benefits for services provided during this visit. Patient/Guardian expressed understanding and agreed to proceed.   Edmonia Lynch, [facility] 07/04/2023

## 2023-07-09 ENCOUNTER — Encounter: Payer: Self-pay | Admitting: Psychiatry

## 2023-07-09 ENCOUNTER — Ambulatory Visit (INDEPENDENT_AMBULATORY_CARE_PROVIDER_SITE_OTHER): Payer: Medicaid Other | Admitting: Psychiatry

## 2023-07-09 VITALS — BP 124/84 | HR 105 | Temp 97.9°F | Ht 59.0 in | Wt 286.4 lb

## 2023-07-09 DIAGNOSIS — F322 Major depressive disorder, single episode, severe without psychotic features: Secondary | ICD-10-CM | POA: Diagnosis not present

## 2023-07-09 DIAGNOSIS — Z79899 Other long term (current) drug therapy: Secondary | ICD-10-CM | POA: Diagnosis not present

## 2023-07-09 DIAGNOSIS — F4310 Post-traumatic stress disorder, unspecified: Secondary | ICD-10-CM

## 2023-07-09 MED ORDER — SERTRALINE HCL 100 MG PO TABS: 150.0000 mg | ORAL_TABLET | Freq: Every day | ORAL | 0 refills | Status: DC
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
## 2023-07-09 NOTE — Patient Instructions (Signed)
 Insomnia Insomnia is a sleep disorder that makes it difficult to fall asleep or stay asleep. Insomnia can cause fatigue, low energy, difficulty concentrating, mood swings, and poor performance at work or school. There are three different ways to classify insomnia: Difficulty falling asleep. Difficulty staying asleep. Waking up too early in the morning. Any type of insomnia can be long-term (chronic) or short-term (acute). Both are common. Short-term insomnia usually lasts for 3 months or less. Chronic insomnia occurs at least three times a week for longer than 3 months. What are the causes? Insomnia may be caused by another condition, situation, or substance, such as: Having certain mental health conditions, such as anxiety and depression. Using caffeine, alcohol, tobacco, or drugs. Having gastrointestinal conditions, such as gastroesophageal reflux disease (GERD). Having certain medical conditions. These include: Asthma. Alzheimer's disease. Stroke. Chronic pain. An overactive thyroid gland (hyperthyroidism). Other sleep disorders, such as restless legs syndrome and sleep apnea. Menopause. Sometimes, the cause of insomnia may not be known. What increases the risk? Risk factors for insomnia include: Gender. Females are affected more often than males. Age. Insomnia is more common as people get older. Stress and certain medical and mental health conditions. Lack of exercise. Having an irregular work schedule. This may include working night shifts and traveling between different time zones. What are the signs or symptoms? If you have insomnia, the main symptom is having trouble falling asleep or having trouble staying asleep. This may lead to other symptoms, such as: Feeling tired or having low energy. Feeling nervous about going to sleep. Not feeling rested in the morning. Having trouble concentrating. Feeling irritable, anxious, or depressed. How is this diagnosed? This condition  may be diagnosed based on: Your symptoms and medical history. Your health care provider may ask about: Your sleep habits. Any medical conditions you have. Your mental health. A physical exam. How is this treated? Treatment for insomnia depends on the cause. Treatment may focus on treating an underlying condition that is causing the insomnia. Treatment may also include: Medicines to help you sleep. Counseling or therapy. Lifestyle adjustments to help you sleep better. Follow these instructions at home: Eating and drinking  Limit or avoid alcohol, caffeinated beverages, and products that contain nicotine and tobacco, especially close to bedtime. These can disrupt your sleep. Do not eat a large meal or eat spicy foods right before bedtime. This can lead to digestive discomfort that can make it hard for you to sleep. Sleep habits  Keep a sleep diary to help you and your health care provider figure out what could be causing your insomnia. Write down: When you sleep. When you wake up during the night. How well you sleep and how rested you feel the next day. Any side effects of medicines you are taking. What you eat and drink. Make your bedroom a dark, comfortable place where it is easy to fall asleep. Put up shades or blackout curtains to block light from outside. Use a white noise machine to block noise. Keep the temperature cool. Limit screen use before bedtime. This includes: Not watching TV. Not using your smartphone, tablet, or computer. Stick to a routine that includes going to bed and waking up at the same times every day and night. This can help you fall asleep faster. Consider making a quiet activity, such as reading, part of your nighttime routine. Try to avoid taking naps during the day so that you sleep better at night. Get out of bed if you are still awake after  15 minutes of trying to sleep. Keep the lights down, but try reading or doing a quiet activity. When you feel  sleepy, go back to bed. General instructions Take over-the-counter and prescription medicines only as told by your health care provider. Exercise regularly as told by your health care provider. However, avoid exercising in the hours right before bedtime. Use relaxation techniques to manage stress. Ask your health care provider to suggest some techniques that may work well for you. These may include: Breathing exercises. Routines to release muscle tension. Visualizing peaceful scenes. Make sure that you drive carefully. Do not drive if you feel very sleepy. Keep all follow-up visits. This is important. Contact a health care provider if: You are tired throughout the day. You have trouble in your daily routine due to sleepiness. You continue to have sleep problems, or your sleep problems get worse. Get help right away if: You have thoughts about hurting yourself or someone else. Get help right away if you feel like you may hurt yourself or others, or have thoughts about taking your own life. Go to your nearest emergency room or: Call 911. Call the National Suicide Prevention Lifeline at (906)021-1611 or 988. This is open 24 hours a day. Text the Crisis Text Line at 325-069-2793. Summary Insomnia is a sleep disorder that makes it difficult to fall asleep or stay asleep. Insomnia can be long-term (chronic) or short-term (acute). Treatment for insomnia depends on the cause. Treatment may focus on treating an underlying condition that is causing the insomnia. Keep a sleep diary to help you and your health care provider figure out what could be causing your insomnia. This information is not intended to replace advice given to you by your health care provider. Make sure you discuss any questions you have with your health care provider. Document Revised: 04/18/2021 Document Reviewed: 04/18/2021 Elsevier Patient Education  2024 [employer].

## 2023-07-11 LAB — LAB REPORT - SCANNED: EGFR: 111

## 2023-07-25 ENCOUNTER — Other Ambulatory Visit: Payer: Self-pay | Admitting: Psychiatry

## 2023-07-25 DIAGNOSIS — F4310 Post-traumatic stress disorder, unspecified: Secondary | ICD-10-CM

## 2023-07-25 DIAGNOSIS — F322 Major depressive disorder, single episode, severe without psychotic features: Secondary | ICD-10-CM

## 2023-07-31 ENCOUNTER — Ambulatory Visit: Payer: Self-pay | Admitting: Professional Counselor

## 2023-08-10 ENCOUNTER — Encounter: Payer: Self-pay | Admitting: Psychiatry

## 2023-08-10 ENCOUNTER — Telehealth: Payer: Medicaid Other | Admitting: Psychiatry

## 2023-08-10 DIAGNOSIS — F322 Major depressive disorder, single episode, severe without psychotic features: Secondary | ICD-10-CM

## 2023-08-10 DIAGNOSIS — Z634 Disappearance and death of family member: Secondary | ICD-10-CM

## 2023-08-10 DIAGNOSIS — F4310 Post-traumatic stress disorder, unspecified: Secondary | ICD-10-CM | POA: Diagnosis not present

## 2023-08-10 MED ORDER — HYDROXYZINE HCL 25 MG PO TABS: 25.0000 mg | ORAL_TABLET | Freq: Three times a day (TID) | ORAL | 1 refills | Status: AC | PRN

## 2023-08-10 MED ORDER — SERTRALINE HCL 100 MG PO TABS: 200.0000 mg | ORAL_TABLET | Freq: Every day | ORAL | 0 refills | Status: DC

## 2023-08-10 NOTE — Progress Notes (Signed)
 Virtual Visit via Video Note  I connected with Desiree Stone on 08/10/23 at 10:00 AM EDT by a video enabled telemedicine application and verified that I am speaking with the correct person using two identifiers.  Location Provider Location : [facility] Patient Location : Car  Participants: Patient , Provider   I discussed the limitations of evaluation and management by telemedicine and the availability of in person appointments. The patient expressed understanding and agreed to proceed.   I discussed the assessment and treatment plan with the patient. The patient was provided an opportunity to ask questions and all were answered. The patient agreed with the plan and demonstrated an understanding of the instructions.   The patient was advised to call back or seek an in-person evaluation if the symptoms worsen or if the condition fails to improve as anticipated.   BH MD OP Progress Note  08/10/2023 8:02 PM Desiree Stone  MRN:  [id number]  Chief Complaint:  Chief Complaint  Patient presents with   Follow-up   Depression   Post-Traumatic Stress Disorder   Medication Refill   HPI: Desiree Stone is a 48 year old African-American female, single, lives in [place], has a history of MDD, PTSD, asthma, chronic respiratory failure, chronic bronchiolitis, diabetes mellitus, obstructive sleep apnea on CPAP, obesity, history of respiratory failure, congestive heart failure, requiring intubation, tracheostomy - 06/19/22, was evaluated by telemedicine today.  She has a history of depression and PTSD, managed with sertraline and trazodone. Since her last visit on February 17th, her sertraline dosage was increased to 150 mg, and she is also taking trazodone 75 to 100 mg. She experiences no side effects from the increased dosage of sertraline, such as nausea, gastrointestinal issues, or tremors. She wants to manage social interactions better and feels that sertraline helps with this.  She recently  experienced a family bereavement with the passing of her uncle, leaving her feeling confused about her emotions. She is seeing a therapist, Miss Lester [place], and plans to discuss her grief in upcoming sessions. She has been given a list of words by her therapist to help articulate her feelings but finds it challenging to describe her emotions.  She experiences anxiety, which manifests as itching in her feet and hands when she becomes nervous. She takes hydroxyzine 25 mg as needed, up to three times a day, for this symptom. She has about four tablets left and requests a 90-day supply.  Denies thoughts of self-harm or harm to others.  She reports getting about eight hours of sleep per night and feels rested upon waking. She uses a CPAP machine for sleep apnea and melatonin to aid her sleep. The home situation with her father has improved as she has been getting out of the house more often, encouraged by her daughter.  Visit Diagnosis:    ICD-10-CM   1. 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 PTSD (post-traumatic stress disorder)  F43.10 sertraline (ZOLOFT) 100 MG tablet    hydrOXYzine (ATARAX) 25 MG tablet    3. Bereavement  Z63.4       Past Psychiatric History: I have reviewed past psychiatric history from progress note on 02/14/2023.  Past trials of Cymbalta, venlafaxine, Abilify.  Past Medical History:  Past Medical History:  Diagnosis Date   Asthma    Bipolar disorder (HCC)    Chronic back pain  Diabetes mellitus without complication (HCC)    Hypertension    Iron deficiency anemia    Leiomyoma    s/p uterine artery emoblization   Sleep apnea    Tobacco use disorder     Past Surgical History:  Procedure Laterality Date   carpel tunnel Bilateral    COLONOSCOPY WITH PROPOFOL N/A 12/08/2014   Procedure: COLONOSCOPY WITH  PROPOFOL;  Surgeon: Christena Deem, MD;  Location: [facility] ENDOSCOPY;  Service: Endoscopy;  Laterality: N/A;   ESOPHAGOGASTRODUODENOSCOPY     TRACHEOSTOMY TUBE PLACEMENT N/A 06/19/2022   Procedure: TRACHEOSTOMY;  Surgeon: Linus Salmons, MD;  Location: [facility] ORS;  Service: ENT;  Laterality: N/A;    Family Psychiatric History: I have reviewed family psychiatric history from progress note on 02/14/2023.  Family History:  Family History  Problem Relation Age of Onset   Breast cancer Maternal Aunt    Breast cancer Paternal Aunt 65   Breast cancer Maternal Grandmother    Cancer Paternal Grandmother        Colon    Social History: I have reviewed social history from progress note on 02/14/2023. Social History   Socioeconomic History   Marital status: Single    Spouse name: Not on file   Number of children: 2   Years of education: Not on file   Highest education level: Some college, no degree  Occupational History   Not on file  Tobacco Use   Smoking status: Every Day    Current packs/day: 2.00    Types: Cigarettes   Smokeless tobacco: Never   Tobacco comments:    2 PPD - 10/25/2022 khj  Vaping Use   Vaping status: Never Used  Substance and Sexual Activity   Alcohol use: No   Drug use: No   Sexual activity: Not Currently    Birth control/protection: None  Other Topics Concern   Not on file  Social History Narrative   Not on file   Social Drivers of Health   Financial Resource Strain: High Risk (05/24/2023)   Overall Financial Resource Strain (CARDIA)    Difficulty of Paying Living Expenses: Very hard  Food Insecurity: Food Insecurity Present (05/24/2023)   Hunger Vital Sign    Worried About Running Out of Food in the Last Year: Sometimes true    Ran Out of Food in the Last Year: Sometimes true  Transportation Needs: Unmet Transportation Needs (05/24/2023)   PRAPARE - Transportation    Lack of Transportation (Medical): Yes    Lack of Transportation (Non-Medical): Yes   Physical Activity: Inactive (05/24/2023)   Exercise Vital Sign    Days of Exercise per Week: 0 days    Minutes of Exercise per Session: 0 min  Stress: Stress Concern Present (05/24/2023)   [employer] of Occupational Health - Occupational Stress Questionnaire    Feeling of Stress : Very much  Social Connections: Socially Isolated (05/24/2023)   Social Connection and Isolation Panel [NHANES]    Frequency of Communication with Friends and Family: Never    Frequency of Social Gatherings with Friends and Family: Never    Attends Religious Services: Never    Database administrator or Organizations: Yes    Attends Banker Meetings: Never    Marital Status: Never married    Allergies:  Allergies  Allergen Reactions   Medroxyprogesterone Other (See Comments)    Sores in the head, back.  Wt. Gain.   Abilify [Aripiprazole] Other (See Comments)    Syncope   Effexor [Venlafaxine]  Other (See Comments)    Hair Loss   Wellbutrin [Bupropion] Other (See Comments)    Acne     Metabolic Disorder Labs: Lab Results  Component Value Date   HGBA1C 9.0 (H) 06/07/2022   MPG 211.6 06/07/2022   Lab Results  Component Value Date   PROLACTIN 10.1 05/23/2017   Lab Results  Component Value Date   CHOL 134 06/07/2022   TRIG 103 06/26/2022   HDL 34 (L) 06/07/2022   CHOLHDL 3.9 06/07/2022   VLDL 17 06/07/2022   LDLCALC 83 06/07/2022   Lab Results  Component Value Date   TSH 0.772 06/06/2022   TSH 1.170 05/23/2017    Therapeutic Level Labs: No results found for: "LITHIUM" No results found for: "VALPROATE" No results found for: "CBMZ"  Current Medications: Current Outpatient Medications  Medication Sig Dispense Refill   acetaminophen (TYLENOL) 325 MG tablet Take 1-2 tablets (325-650 mg total) by mouth every 4 (four) hours as needed for mild pain.     albuterol (PROVENTIL HFA;VENTOLIN HFA) 108 (90 BASE) MCG/ACT inhaler Inhale into the lungs every 6 (six) hours as needed for  wheezing or shortness of breath.     albuterol (VENTOLIN HFA) 108 (90 Base) MCG/ACT inhaler Inhale into the lungs every 6 (six) hours as needed for shortness of breath.     atorvastatin (LIPITOR) 40 MG tablet Take 1 tablet (40 mg total) by mouth at bedtime. 30 tablet 0   cetirizine (ZYRTEC) 10 MG tablet Take 10 mg by mouth daily.     cyanocobalamin 1000 MCG tablet Take 1,000 mcg by mouth daily.     enalapril (VASOTEC) 20 MG tablet Take 20 mg by mouth 2 (two) times daily at 10 AM and 5 PM.     famotidine (PEPCID) 20 MG tablet Take 1 tablet (20 mg total) by mouth 2 (two) times daily. 60 tablet 0   fluticasone (FLOVENT HFA) 110 MCG/ACT inhaler Inhale 2 puffs into the lungs 2 (two) times daily. 12 g 0   furosemide (LASIX) 40 MG tablet Take 1 tablet (40 mg total) by mouth daily. 30 tablet 0   gabapentin (NEURONTIN) 300 MG capsule Take 300 mg by mouth 2 (two) times daily at 10 AM and 5 PM.     hydrOXYzine (ATARAX) 25 MG tablet Take 1 tablet (25 mg total) by mouth 3 (three) times daily as needed. 270 tablet 1   insulin aspart (NOVOLOG) 100 UNIT/ML FlexPen Inject 7 Units into the skin 3 (three) times daily with meals. 15 mL 0   insulin glargine (LANTUS) 100 UNIT/ML Solostar Pen Inject 30 Units into the skin 2 (two) times daily. (Patient taking differently: Inject 50 Units into the skin at bedtime.) 15 mL 0   Insulin Pen Needle 32G X 4 MM MISC Use 4 (four) times daily with insulin pen. 100 each 0   ipratropium (ATROVENT HFA) 17 MCG/ACT inhaler Inhale 2 puffs into the lungs every 4 (four) hours as needed for wheezing.     KLOR-CON M10 10 MEQ tablet Take 10 mEq by mouth daily.     melatonin 5 MG TABS Take 1 tablet (5 mg total) by mouth at bedtime as needed. 30 tablet 0   Multiple Vitamin (MULTIVITAMIN WITH MINERALS) TABS tablet Take 1 tablet by mouth daily. 30 tablet 0   potassium chloride (KLOR-CON) 10 MEQ tablet Take 10 mEq by mouth daily.     sertraline (ZOLOFT) 100 MG tablet Take 2 tablets (200 mg total)  by mouth daily with breakfast.  180 tablet 0   traZODone (DESYREL) 50 MG tablet TAKE 1 AND 1/2 TO 2 TABLETS(75 TO 100 MG) BY MOUTH AT BEDTIME AS NEEDED FOR SLEEP 60 tablet 1   varenicline (CHANTIX) 1 MG tablet Take 1 tablet (1 mg total) by mouth 2 (two) times daily. 60 tablet 3   No current facility-administered medications for this visit.     Musculoskeletal: Strength & Muscle Tone:  UTA Gait & Station:  Seated Patient leans: N/A  Psychiatric Specialty Exam: Review of Systems  Psychiatric/Behavioral:  Positive for dysphoric mood.     There were no vitals taken for this visit.There is no height or weight on file to calculate BMI.  General Appearance: Casual  Eye Contact:  Fair  Speech:  Normal Rate  Volume:  Normal  Mood:  Depressed  Affect:  Congruent  Thought Process:  Goal Directed and Descriptions of Associations: Intact  Orientation:  Full (Time, Place, and Person)  Thought Content: Logical   Suicidal Thoughts:  No  Homicidal Thoughts:  No  Memory:  Immediate;   Fair Recent;   Fair Remote;   Fair  Judgement:  Fair  Insight:  Fair  Psychomotor Activity:  Normal  Concentration:  Concentration: Fair and Attention Span: Fair  Recall:  [employer] of Knowledge: Fair  Language: Fair  Akathisia:  No  Handed:  Right  AIMS (if indicated): not done  Assets:  Therapist, sports  ADL's:  Intact  Cognition: WNL  Sleep:  Fair   Screenings: GAD-7    Advertising copywriter from 05/24/2023 in [facility] Office Visit from 03/20/2023 in [facility] Office Visit from 02/14/2023 in [facility]  Total GAD-7 Score 15 13 10       PHQ2-9    Flowsheet Row Office Visit from 07/09/2023 in [facility] Counselor from 05/24/2023 in [facility]  Office Visit from 03/20/2023 in [facility] Office Visit from 02/14/2023 in [facility]  PHQ-2 Total Score 5 6 4 4   PHQ-9 Total Score 18 21 12 18       Flowsheet Row Video Visit from 08/10/2023 in [facility] Office Visit from 07/09/2023 in [facility] Counselor from 05/24/2023 in [facility]  C-SSRS RISK CATEGORY No Risk No Risk No Risk        Assessment and Plan: Desiree Stone is a 48 year old African-American female, single, lives in [place], has a history of MDD, PTSD, multiple medical problems including sleep apnea on CPAP was evaluated by telemedicine today.  Discussed assessment and plan as noted below.  Major Depressive Disorder-improving Major depressive disorder, currently on Sertraline 150 mg. No side effects from increased dosage. Interested in further dose increase to improve social interactions. Denies thoughts of self-harm or harm to others. Decision to increase Sertraline to 200 mg daily to enhance social interaction and mood. - Increase Sertraline to 200 mg daily. Prescribe two 100 mg tablets daily. - Send prescription for 200 mg sertraline to [facility].   Post-Traumatic Stress Disorder (PTSD)-improving PTSD, currently in therapy with L[facility] [place]. Encouraged to discuss recent grief and emotional experiences with therapist. - Continue therapy with L[facility] [place]. - Continue Sertraline as noted above. - Continue Trazodone 75-100 mg at bedtime as needed - Continue Melatonin 5 mg at bedtime  as needed - Continue sleep hygiene techniques.  Grief-unstable Experiencing grief following recent family member's death. Difficulty identifying emotions related to loss. Reassured that grief is personal and encouraged to discuss with therapist. - Encourage discussion of  grief and emotions with therapist. - Prescribe Hydroxyzine 25 mg 3 times a day as needed.  Patient to limit use.  Follow-up - Follow-up in clinic in 1 month or sooner if needed.  Collaboration of Care: Collaboration of Care: Referral or follow-up with counselor/therapist AEB patient encouraged to continue CBT.  Patient/Guardian was advised Release of Information must be obtained prior to any record release in order to collaborate their care with an outside provider. Patient/Guardian was advised if they have not already done so to contact the registration department to sign all necessary forms in order for [place] to release information regarding their care.   Consent: Patient/Guardian gives verbal consent for treatment and assignment of benefits for services provided during this visit. Patient/Guardian expressed understanding and agreed to proceed.  Discussed the use of a AI scribe software for clinical note transcription with the patient, who gave verbal consent to proceed.  This note was generated in part or whole with voice recognition software. Voice recognition is usually quite accurate but there are transcription errors that can and very often do occur. I apologize for any typographical errors that were not detected and corrected.     Jomarie Longs, MD 08/10/2023, 8:02 PM

## 2023-08-14 ENCOUNTER — Ambulatory Visit (INDEPENDENT_AMBULATORY_CARE_PROVIDER_SITE_OTHER): Payer: Self-pay | Admitting: Professional Counselor

## 2023-08-14 DIAGNOSIS — Z91199 Patient's noncompliance with other medical treatment and regimen due to unspecified reason: Secondary | ICD-10-CM

## 2023-08-14 NOTE — Progress Notes (Signed)
 Patient no-showed today's appointment; appointment was for 08/14/23 at 3 PM for follow-up outpatient therapy. Due to time period since last session, cln will mail letter about attendance/discharge.

## 2023-09-28 ENCOUNTER — Encounter: Payer: Self-pay | Admitting: Psychiatry

## 2023-09-28 ENCOUNTER — Telehealth (INDEPENDENT_AMBULATORY_CARE_PROVIDER_SITE_OTHER): Admitting: Psychiatry

## 2023-09-28 DIAGNOSIS — F17210 Nicotine dependence, cigarettes, uncomplicated: Secondary | ICD-10-CM

## 2023-09-28 DIAGNOSIS — Z634 Disappearance and death of family member: Secondary | ICD-10-CM

## 2023-09-28 DIAGNOSIS — F17200 Nicotine dependence, unspecified, uncomplicated: Secondary | ICD-10-CM

## 2023-09-28 DIAGNOSIS — F322 Major depressive disorder, single episode, severe without psychotic features: Secondary | ICD-10-CM

## 2023-09-28 DIAGNOSIS — F4310 Post-traumatic stress disorder, unspecified: Secondary | ICD-10-CM

## 2023-09-28 MED ORDER — TRAZODONE HCL 100 MG PO TABS: 150.0000 mg | ORAL_TABLET | Freq: Every day | ORAL | 1 refills | Status: DC

## 2023-09-28 MED ORDER — VARENICLINE TARTRATE 1 MG PO TABS: 1.0000 mg | ORAL_TABLET | Freq: Two times a day (BID) | ORAL | 3 refills | Status: AC

## 2023-09-28 NOTE — Progress Notes (Signed)
 Virtual Visit via Video Note  I connected with Desiree Stone on 09/28/23 at 11:40 AM EDT by a video enabled telemedicine application and verified that I am speaking with the correct person using two identifiers.  Location Provider Location : [facility] Patient Location : Home  Participants: Patient , Provider   I discussed the limitations of evaluation and management by telemedicine and the availability of in person appointments. The patient expressed understanding and agreed to proceed.   I discussed the assessment and treatment plan with the patient. The patient was provided an opportunity to ask questions and all were answered. The patient agreed with the plan and demonstrated an understanding of the instructions.   The patient was advised to call back or seek an in-person evaluation if the symptoms worsen or if the condition fails to improve as anticipated.   BH MD OP Progress Note  09/28/2023 12:33 PM Desiree Stone  MRN:  [id number]  Chief Complaint:  Chief Complaint  Patient presents with   Follow-up   Anxiety   Depression   Medication Refill   Discussed the use of AI scribe software for clinical note transcription with the patient, who gave verbal consent to proceed.  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  She continues to experience persistent sleep disturbances despite medication adjustments. She uses a CPAP machine daily and takes melatonin every night. If she misses the optimal time to go to bed after taking her medication, she remains unable to sleep. She is currently on  trazodone  75 mg.  She experiences ongoing symptoms of depression and trauma, including intrusive memories related to past trauma. No nightmares or flashbacks are present, but she mentions dreaming about future events. She has an upcoming appointment with her therapist on either the 21st or 25th of the month.  Denies suicidal thoughts or thoughts of harming others.  Denies any perceptual disturbances.  She smokes about 15 cigarettes per day and is working on reducing this with the help of Chantix .  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    Visit Diagnosis:    ICD-10-CM   1. Current severe episode of major depressive disorder without psychotic features without prior episode (HCC)  F32.2 traZODone  (DESYREL ) 100 MG tablet   Improving    2. PTSD (post-traumatic stress disorder)  F43.10 traZODone  (DESYREL ) 100 MG tablet    3. Bereavement  Z63.4     4. Tobacco use disorder  F17.200 varenicline  (CHANTIX ) 1 MG tablet      Past Psychiatric History: I have reviewed past psychiatric history from progress note on 02/14/2023.  Past trials of Cymbalta, venlafaxine, Abilify.  Past Medical History:  Past Medical History:  Diagnosis Date   Asthma    Bipolar disorder (HCC)    Chronic back pain    Diabetes mellitus without complication (HCC)    Hypertension    Iron  deficiency anemia    Leiomyoma    s/p uterine artery emoblization   Sleep apnea    Tobacco use disorder     Past Surgical History:  Procedure Laterality Date   carpel tunnel Bilateral  COLONOSCOPY WITH PROPOFOL  N/A 12/08/2014   Procedure: COLONOSCOPY WITH PROPOFOL ;  Surgeon: Deveron Fly, MD;  Location: [facility] ENDOSCOPY;  Service: Endoscopy;  Laterality: N/A;   ESOPHAGOGASTRODUODENOSCOPY     TRACHEOSTOMY TUBE PLACEMENT N/A 06/19/2022   Procedure: TRACHEOSTOMY;  Surgeon: Lesly Raspberry, MD;  Location: [facility] ORS;   Service: ENT;  Laterality: N/A;    Family Psychiatric History: I have reviewed family psychiatric history from progress note on 02/14/2023.  Family History:  Family History  Problem Relation Age of Onset   Breast cancer Maternal Aunt    Breast cancer Paternal Aunt 36   Breast cancer Maternal Grandmother    Cancer Paternal Grandmother        Colon    Social History: I have reviewed social history from progress note on 02/14/2023. Social History   Socioeconomic History   Marital status: Single    Spouse name: Not on file   Number of children: 2   Years of education: Not on file   Highest education level: Some college, no degree  Occupational History   Not on file  Tobacco Use   Smoking status: Every Day    Current packs/day: 2.00    Types: Cigarettes   Smokeless tobacco: Never   Tobacco comments:    2 PPD - 10/25/2022 khj  Vaping Use   Vaping status: Never Used  Substance and Sexual Activity   Alcohol use: No   Drug use: No   Sexual activity: Not Currently    Birth control/protection: None  Other Topics Concern   Not on file  Social History Narrative   Not on file   Social Drivers of Health   Financial Resource Strain: High Risk (05/24/2023)   Overall Financial Resource Strain (CARDIA)    Difficulty of Paying Living Expenses: Very hard  Food Insecurity: Food Insecurity Present (05/24/2023)   Hunger Vital Sign    Worried About Running Out of Food in the Last Year: Sometimes true    Ran Out of Food in the Last Year: Sometimes true  Transportation Needs: Unmet Transportation Needs (05/24/2023)   PRAPARE - Transportation    Lack of Transportation (Medical): Yes    Lack of Transportation (Non-Medical): Yes  Physical Activity: Inactive (05/24/2023)   Exercise Vital Sign    Days of Exercise per Week: 0 days    Minutes of Exercise per Session: 0 min  Stress: Stress Concern Present (05/24/2023)   [employer] of Occupational Health - Occupational Stress Questionnaire     Feeling of Stress : Very much  Social Connections: Socially Isolated (05/24/2023)   Social Connection and Isolation Panel [NHANES]    Frequency of Communication with Friends and Family: Never    Frequency of Social Gatherings with Friends and Family: Never    Attends Religious Services: Never    Database administrator or Organizations: Yes    Attends Banker Meetings: Never    Marital Status: Never married    Allergies:  Allergies  Allergen Reactions   Medroxyprogesterone  Other (See Comments)    Sores in the head, back.  Wt. Gain.   Abilify [Aripiprazole] Other (See Comments)    Syncope   Effexor [Venlafaxine] Other (See Comments)    Hair Loss   Wellbutrin [Bupropion] Other (See Comments)    Acne     Metabolic Disorder Labs: Lab Results  Component Value Date   HGBA1C 9.0 (H) 06/07/2022   MPG 211.6 06/07/2022   Lab Results  Component Value Date   PROLACTIN 10.1  05/23/2017   Lab Results  Component Value Date   CHOL 134 06/07/2022   TRIG 103 06/26/2022   HDL 34 (L) 06/07/2022   CHOLHDL 3.9 06/07/2022   VLDL 17 06/07/2022   LDLCALC 83 06/07/2022   Lab Results  Component Value Date   TSH 0.772 06/06/2022   TSH 1.170 05/23/2017    Therapeutic Level Labs: No results found for: "LITHIUM" No results found for: "VALPROATE" No results found for: "CBMZ"  Current Medications: Current Outpatient Medications  Medication Sig Dispense Refill   traZODone  (DESYREL ) 100 MG tablet Take 1.5 tablets (150 mg total) by mouth at bedtime. 45 tablet 1   acetaminophen  (TYLENOL ) 325 MG tablet Take 1-2 tablets (325-650 mg total) by mouth every 4 (four) hours as needed for mild pain.     albuterol  (PROVENTIL  HFA;VENTOLIN  HFA) 108 (90 BASE) MCG/ACT inhaler Inhale into the lungs every 6 (six) hours as needed for wheezing or shortness of breath.     albuterol  (VENTOLIN  HFA) 108 (90 Base) MCG/ACT inhaler Inhale into the lungs every 6 (six) hours as needed for shortness of breath.      atorvastatin  (LIPITOR) 40 MG tablet Take 1 tablet (40 mg total) by mouth at bedtime. 30 tablet 0   cetirizine (ZYRTEC) 10 MG tablet Take 10 mg by mouth daily.     cyanocobalamin 1000 MCG tablet Take 1,000 mcg by mouth daily.     enalapril  (VASOTEC ) 20 MG tablet Take 20 mg by mouth 2 (two) times daily at 10 AM and 5 PM.     famotidine  (PEPCID ) 20 MG tablet Take 1 tablet (20 mg total) by mouth 2 (two) times daily. 60 tablet 0   fluticasone  (FLOVENT  HFA) 110 MCG/ACT inhaler Inhale 2 puffs into the lungs 2 (two) times daily. 12 g 0   furosemide  (LASIX ) 40 MG tablet Take 1 tablet (40 mg total) by mouth daily. 30 tablet 0   gabapentin (NEURONTIN) 300 MG capsule Take 300 mg by mouth 2 (two) times daily at 10 AM and 5 PM.     hydrOXYzine  (ATARAX ) 25 MG tablet Take 1 tablet (25 mg total) by mouth 3 (three) times daily as needed. 270 tablet 1   insulin  aspart (NOVOLOG ) 100 UNIT/ML FlexPen Inject 7 Units into the skin 3 (three) times daily with meals. 15 mL 0   insulin  glargine (LANTUS ) 100 UNIT/ML Solostar Pen Inject 30 Units into the skin 2 (two) times daily. (Patient taking differently: Inject 50 Units into the skin at bedtime.) 15 mL 0   Insulin  Pen Needle 32G X 4 MM MISC Use 4 (four) times daily with insulin  pen. 100 each 0   ipratropium (ATROVENT  HFA) 17 MCG/ACT inhaler Inhale 2 puffs into the lungs every 4 (four) hours as needed for wheezing.     KLOR-CON  M10 10 MEQ tablet Take 10 mEq by mouth daily.     melatonin 5 MG TABS Take 1 tablet (5 mg total) by mouth at bedtime as needed. 30 tablet 0   Multiple Vitamin (MULTIVITAMIN WITH MINERALS) TABS tablet Take 1 tablet by mouth daily. 30 tablet 0   potassium chloride  (KLOR-CON ) 10 MEQ tablet Take 10 mEq by mouth daily.     sertraline  (ZOLOFT ) 100 MG tablet Take 2 tablets (200 mg total) by mouth daily with breakfast. 180 tablet 0   varenicline  (CHANTIX ) 1 MG tablet Take 1 tablet (1 mg total) by mouth 2 (two) times daily. 60 tablet 3   No current  facility-administered medications for this visit.  Musculoskeletal: Strength & Muscle Tone: UTA Gait & Station: Seated Patient leans: N/A  Psychiatric Specialty Exam: Review of Systems  Psychiatric/Behavioral:  Positive for sleep disturbance. The patient is nervous/anxious.        Depression improving    There were no vitals taken for this visit.There is no height or weight on file to calculate BMI.  General Appearance: Casual  Eye Contact:  Fair  Speech:  Clear and Coherent  Volume:  Normal  Mood:  Anxious depression improving  Affect:  Congruent  Thought Process:  Goal Directed and Descriptions of Associations: Intact  Orientation:  Full (Time, Place, and Person)  Thought Content: Logical   Suicidal Thoughts:  No  Homicidal Thoughts:  No  Memory:  Immediate;   Fair Recent;   Fair Remote;   Fair  Judgement:  Fair  Insight:  Fair  Psychomotor Activity:  Normal  Concentration:  Concentration: Fair and Attention Span: Fair  Recall:  [employer] of Knowledge: Fair  Language: Fair  Akathisia:  No  Handed:  Right  AIMS (if indicated): not done  Assets:  Desire for Improvement Housing Social Support Transportation  ADL's:  Intact  Cognition: WNL  Sleep:  Poor   Screenings: GAD-7    Advertising copywriter from 05/24/2023 in [facility] Office Visit from 03/20/2023 in [facility] Office Visit from 02/14/2023 in [facility]  Total GAD-7 Score 15 13 10       PHQ2-9    Flowsheet Row Office Visit from 07/09/2023 in [facility] Counselor from 05/24/2023 in [facility] Office Visit from 03/20/2023 in [facility] Office Visit from 02/14/2023 in [facility]  PHQ-2 Total Score 5 6 4 4   PHQ-9 Total  Score 18 21 12 18       Flowsheet Row Video Visit from 09/28/2023 in [facility] Video Visit from 08/10/2023 in [facility] Office Visit from 07/09/2023 in [facility]  C-SSRS RISK CATEGORY No Risk No Risk No Risk        Assessment and Plan: Desiree Stone is a 48 year old African-American female, single, lives in [place], has a history of MDD, PTSD, multiple medical problems including sleep apnea was evaluated by telemedicine today.  Discussed assessment and plan as noted below.  Major depressive disorder-improving Currently reports improvement on the current dosage of sertraline .  Although she does continue to struggle with sleep.  Denies side effects. - Continue Sertraline  200 mg daily - Increase Trazodone  to 150 mg at bedtime.  PTSD-improving Currently notes PTSD symptoms as manageable on the current medication regimen although she does have intrusive memories mostly because of her living situation and stressors related to the same.  She is motivated to stay in therapy and has upcoming appointment.  She does struggle with sleep and is interested in a dosage  - Continue psychotherapy sessions with Ms. Josiephine Nightingale - Continue Sertraline  as prescribed - Increase Trazodone  to 150 mg at bedtime. - Continue Melatonin 5 mg at bedtime as needed - Continue sleep hygiene techniques  Grief-improving Grief is manageable and is motivated to stay in therapy. - Continue Hydroxyzine  25 mg 3 times a day as needed - Continue psychotherapy sessions  Tobacco use disorder-unstable Continues to smoke cigarettes, 15 cigarettes/day.  Interested in quitting.  Would like to  try Chantix  again. - Start Chantix  1 mg twice daily.  She was previously provided this prescription by pulmonologist although she has ran out.  Tolerated it well.  Follow-up Follow-up in clinic in 2 months or  sooner if needed.     Collaboration of Care: Collaboration of Care: Referral or follow-up with counselor/therapist AEB continue CBT  Patient/Guardian was advised Release of Information must be obtained prior to any record release in order to collaborate their care with an outside provider. Patient/Guardian was advised if they have not already done so to contact the registration department to sign all necessary forms in order for us  to release information regarding their care.   Consent: Patient/Guardian gives verbal consent for treatment and assignment of benefits for services provided during this visit. Patient/Guardian expressed understanding and agreed to proceed.   This note was generated in part or whole with voice recognition software. Voice recognition is usually quite accurate but there are transcription errors that can and very often do occur. I apologize for any typographical errors that were not detected and corrected.    Kenyatte Gruber, MD 09/28/2023, 12:33 PM

## 2023-09-28 NOTE — Patient Instructions (Signed)
 Varenicline Tablets What is this medication? VARENICLINE (var e NI kleen) helps you quit smoking. It reduces cravings for nicotine, the addictive substance found in tobacco. It is most effective when used in combination with a stop-smoking program. This medicine may be used for other purposes; ask your health care provider or pharmacist if you have questions. COMMON BRAND NAME(S): Chantix What should I tell my care team before I take this medication? They need to know if you have any of these conditions: Heart disease Frequently drink alcohol Kidney disease Mental health condition On hemodialysis Seizures History of stroke Suicidal thoughts, plans, or attempt by you or a family member An unusual or allergic reaction to varenicline, other medications, foods, dyes, or preservatives Pregnant or trying to get pregnant Breast-feeding How should I use this medication? Take this medication by mouth after eating. Take with a full glass of water. Follow the directions on the prescription label. Take your doses at regular intervals. Do not take your medication more often than directed. There are 3 ways you can use this medication to help you quit smoking; talk to your care team to decide which plan is right for you: 1) you can choose a quit date and start this medication 1 week before the quit date, or, 2) you can start taking this medication before you choose a quit date, and then pick a quit date between day 8 and 35 days of treatment, or, 3) if you are not sure that you are able or willing to quit smoking right away, start taking this medication and slowly decrease the amount you smoke as directed by your care team with the goal of being cigarette-free by week 12 of treatment. Stick to your plan; ask about support groups or other ways to help you remain cigarette-free. If you are motivated to quit smoking and did not succeed during a previous attempt with this medication for reasons other than side  effects, or if you returned to smoking after this treatment, speak with your care team about whether another course of this medication may be right for you. A special MedGuide will be given to you by the pharmacist with each prescription and refill. Be sure to read this information carefully each time. Talk to your care team about the use of this medication in children. This medication is not approved for use in children. Overdosage: If you think you have taken too much of this medicine contact a poison control center or emergency room at once. NOTE: This medicine is only for you. Do not share this medicine with others. What if I miss a dose? If you miss a dose, take it as soon as you can. If it is almost time for your next dose, take only that dose. Do not take double or extra doses. What may interact with this medication? Alcohol Insulin Other medications used to help people quit smoking Theophylline Warfarin This list may not describe all possible interactions. Give your health care provider a list of all the medicines, herbs, non-prescription drugs, or dietary supplements you use. Also tell them if you smoke, drink alcohol, or use illegal drugs. Some items may interact with your medicine. What should I watch for while using this medication? It is okay if you do not succeed at your attempt to quit and have a cigarette. You can still continue your quit attempt and keep using this medication as directed. Just throw away your cigarettes and get back to your quit plan. Talk to your care team  before using other treatments to quit smoking. Using this medication with other treatments to quit smoking may increase the risk for side effects compared to using a treatment alone. This medication may affect your coordination, reaction time, or judgment. Do not drive or operate machinery until you know how this medication affects you. Sit up or stand slowly to reduce the risk of dizzy or fainting  spells. Decrease the number of alcoholic beverages that you drink during treatment with this medication until you know if this medication affects your ability to tolerate alcohol. Some people have experienced increased drunkenness (intoxication), unusual or sometimes aggressive behavior, or no memory of things that have happened (amnesia) during treatment with this medication. You may do unusual sleep behaviors or activities you do not remember the day after taking this medication. Activities include driving, making or eating food, talking on the phone, sexual activity, or sleep walking. Stop taking this medication and call your care team right away if you find out you have done activities like this. Patients and their families should watch out for new or worsening depression or thoughts of suicide. Also watch out for sudden changes in feelings such as feeling anxious, agitated, panicky, irritable, hostile, aggressive, impulsive, severely restless, overly excited and hyperactive, or not being able to sleep. If this happens, call your care team. If you have diabetes, and you quit smoking, the effects of insulin may be increased. You may need to reduce your insulin dose. Check with your care team about how you should adjust your insulin dose. What side effects may I notice from receiving this medication? Side effects that you should report to your care team as soon as possible: Allergic reactions or angioedema--skin rash, itching or hives, swelling of the face, eyes, lips, tongue, arms, or legs, trouble swallowing or breathing Heart attack--pain or tightness in the chest, shoulders, arms, or jaw, nausea, shortness of breath, cold or clammy skin, feeling faint or lightheaded Mood and behavior changes--anxiety, nervousness, confusion, hallucinations, irritability, hostility, thoughts of suicide or self-harm, worsening mood, feelings of depression Redness, blistering, peeling, or loosening of the skin,  including inside the mouth Stroke--sudden numbness or weakness of the face, arm, or leg, trouble speaking, confusion, trouble walking, loss of balance or coordination, dizziness, severe headache, change in vision Seizures Side effects that usually do not require medical attention (report to your care team if they continue or are bothersome): Constipation Drowsiness Gas Nausea Trouble sleeping Upset stomach Vivid dreams or nightmares Vomiting This list may not describe all possible side effects. Call your doctor for medical advice about side effects. You may report side effects to FDA at 1-800-FDA-1088. Where should I keep my medication? Keep out of the reach of children and pets. Store at room temperature between 15 and 30 degrees C (59 and 86 degrees F). Throw away any unused medication after the expiration date. NOTE: This sheet is a summary. It may not cover all possible information. If you have questions about this medicine, talk to your doctor, pharmacist, or health care provider.  2024 Elsevier/Gold Standard (2021-03-31 00:00:00)

## 2023-10-10 ENCOUNTER — Other Ambulatory Visit: Payer: Self-pay | Admitting: *Deleted

## 2023-10-10 ENCOUNTER — Ambulatory Visit (INDEPENDENT_AMBULATORY_CARE_PROVIDER_SITE_OTHER): Admitting: Professional Counselor

## 2023-10-10 DIAGNOSIS — J449 Chronic obstructive pulmonary disease, unspecified: Secondary | ICD-10-CM

## 2023-10-10 DIAGNOSIS — F4310 Post-traumatic stress disorder, unspecified: Secondary | ICD-10-CM | POA: Diagnosis not present

## 2023-10-10 NOTE — Progress Notes (Signed)
  THERAPIST PROGRESS NOTE  Session Time: 3:00 PM - 3:45 PM   Participation Level: Active  Behavioral Response: Casual, Alert, Anxious and Dysphoric  Type of Therapy: Individual Therapy  Treatment Goals addressed: Active  BH CCP Acute or Chronic Trauma Reaction  LTG: "My goal is to be able to see medical practitioners without having anxiety to be able to have meaningful care. All appointments freak me out. Whether it's you or somebody else. I think they're going to tell me there's something wrong."                 Start:  06/06/23    Expected End:  06/04/24      STG: Report a decrease in PTSD symptoms as evidenced by a 25% reduction in overall score on a clinician administered PTSD assessment screen/scale    STG: "I've gotten this habit of coughing and spitting to check it to see if there's something wrong." To reduce compulsive behaviors and attached anxiety AEB exposure exercises and grounding techniques over the next 12 weeks.    ProgressTowards Goals: Not Progressing  Interventions: CBT and Supportive  Summary: Desiree Stone is a 48 y.o. female who presents with a history of MDD and PTSD. She appeared alert and oriented x5. She stated she was avoiding her appointments and working through her trauma, but she feels like she's ready to address things now. Desiree Stone provided updates on how things have been since she was last seen in February. She has not been able to complete the [facility] worksheets that were assigned, but engaged in completing two worksheets today. Desiree Stone was receptive to Socratic questioning and able to challenge her thoughts herself at times. She was in agreement to complete worksheets for homework before next session.  Therapist Response: Conducted session with [employer]. Began session with check-in/update since previous session. Utilized empathetic and reflective listening. Normalized tendency to avoid therapy/working through trauma and praised Desiree Stone for showing  up today. Completed ABC worksheets in session with two identified scenarios Desiree Stone provided. Used Socratic questioning to challenge negative thinking and highlighted use of cognitive distortions, such as if-then statements, should statements, magnification, and mind reading. Assigned additional ABC worksheets for homework. Scheduled additional appointment and concluded session.   Suicidal/Homicidal: No  Plan: Return again in 6 weeks.  Diagnosis: PTSD (post-traumatic stress disorder)  Collaboration of Care: Medication Management AEB chart review  Patient/Guardian was advised Release of Information must be obtained prior to any record release in order to collaborate their care with an outside provider. Patient/Guardian was advised if they have not already done so to contact the registration department to sign all necessary forms in order for us  to release information regarding their care.   Consent: Patient/Guardian gives verbal consent for treatment and assignment of benefits for services provided during this visit. Patient/Guardian expressed understanding and agreed to proceed.   Len Quale, [facility] 10/10/2023

## 2023-10-11 ENCOUNTER — Telehealth: Payer: Self-pay | Admitting: Psychiatry

## 2023-10-11 NOTE — Telephone Encounter (Signed)
 I have reviewed labs dated 07/10/2023-CBC with differential-RBC elevated at 6.48, hematocrit 49.9 elevated, MCV low at 77, MCH 23.8-low, RDW-high at 17.6.  CMP-glucose elevated at 140 albumin low at 3.7 otherwise within normal limits.  Alkaline phosphatase elevated at 160. AST and ALT-within normal limits.  Lipid panel-HDL low at 32. Cholesterol 142, triglyceride 95, LDL-92-within normal limits.

## 2023-10-26 ENCOUNTER — Telehealth: Admitting: Psychiatry

## 2023-10-26 ENCOUNTER — Encounter: Payer: Self-pay | Admitting: Psychiatry

## 2023-10-26 DIAGNOSIS — F3342 Major depressive disorder, recurrent, in full remission: Secondary | ICD-10-CM | POA: Diagnosis not present

## 2023-10-26 DIAGNOSIS — Z634 Disappearance and death of family member: Secondary | ICD-10-CM | POA: Diagnosis not present

## 2023-10-26 DIAGNOSIS — F4310 Post-traumatic stress disorder, unspecified: Secondary | ICD-10-CM | POA: Diagnosis not present

## 2023-10-26 DIAGNOSIS — F17200 Nicotine dependence, unspecified, uncomplicated: Secondary | ICD-10-CM

## 2023-10-26 DIAGNOSIS — F17210 Nicotine dependence, cigarettes, uncomplicated: Secondary | ICD-10-CM

## 2023-10-26 MED ORDER — GABAPENTIN 300 MG PO CAPS
300.0000 mg | ORAL_CAPSULE | Freq: Three times a day (TID) | ORAL | Status: AC
Start: 2023-10-26 — End: ?

## 2023-10-26 MED ORDER — SERTRALINE HCL 100 MG PO TABS
200.0000 mg | ORAL_TABLET | Freq: Every day | ORAL | 0 refills | Status: DC
Start: 2023-10-26 — End: 2024-01-31

## 2023-10-26 NOTE — Progress Notes (Signed)
 Virtual Visit via Video Note  I connected with Desiree Stone on 10/26/23 at 11:30 AM EDT by a video enabled telemedicine application and verified that I am speaking with the correct person using two identifiers.  Location Provider Location : [facility] Patient Location : Home  Participants: Patient , Provider    I discussed the limitations of evaluation and management by telemedicine and the availability of in person appointments. The patient expressed understanding and agreed to proceed.   I discussed the assessment and treatment plan with the patient. The patient was provided an opportunity to ask questions and all were answered. The patient agreed with the plan and demonstrated an understanding of the instructions.   The patient was advised to call back or seek an in-person evaluation if the symptoms worsen or if the condition fails to improve as anticipated.  BH MD OP Progress Note  10/26/2023 12:16 PM JEMIAH CUADRA  MRN:  [id number]  Chief Complaint:  Chief Complaint  Patient presents with   Follow-up   Depression   Anxiety   Medication Refill   Discussed the use of AI scribe software for clinical note transcription with the patient, who gave verbal consent to proceed.  History of Present Illness Desiree Stone is a 48 year old African-American female, single, lives in [place], has a history of MDD, PTSD, asthma, chronic respiratory failure, chronic bronchiolitis, diabetes mellitus, obstructive sleep apnea on CPAP, obesity, history of respiratory failure, congestive heart failure requiring intubation, tracheostomy (06/19/2022) was evaluated by telemedicine today.  She has a history of depression, PTSD, and bereavement. Her current medication regimen includes sertraline  200 mg daily, trazodone  150 mg, hydroxyzine  25 mg three times a day as needed, and gabapentin 300 mg twice a day. She feels more capable of engaging in daily activities and has been working as an Biomedical scientist,  attempting to do so every day. She is getting about 6 to 8 hours of sleep per night and feels rested with the current medication dosage.  She is taking Chantix  1 mg twice daily for smoking cessation and reports a decrease in the amount she smokes. A pulmonary test is scheduled. Her appetite is good, and she denies any cravings for sweets.  Denies suicidal thoughts, thoughts of harming others, or significant depression or hopelessness in the past weeks. She is trying to be more engaged with her family and reports spending time with them.  Regarding her anxiety, she does not feel it has improved significantly but is making efforts to manage it. She is continuing therapy sessions and feels they are going well.   No changes to her medications, allergies, or medical problems since the last visit.  She denies any other concerns today.    Visit Diagnosis:    ICD-10-CM   1. Recurrent major depressive disorder, in full remission (HCC)  F33.42 gabapentin (NEURONTIN) 300 MG capsule    sertraline  (ZOLOFT ) 100 MG tablet    2. PTSD (post-traumatic stress disorder)  F43.10 sertraline  (ZOLOFT ) 100 MG tablet    3. Bereavement  Z63.4     4. Tobacco use disorder  F17.200       Past Psychiatric History: I have reviewed past psychiatric history from progress note on 02/14/2023.  Past trials of Cymbalta, venlafaxine, Abilify.  Past Medical History:  Past Medical History:  Diagnosis Date   Asthma    Bipolar disorder (HCC)    Chronic back pain    Diabetes mellitus without complication (HCC)    Hypertension  Iron  deficiency anemia    Leiomyoma    s/p uterine artery emoblization   Sleep apnea    Tobacco use disorder     Past Surgical History:  Procedure Laterality Date   carpel tunnel Bilateral    COLONOSCOPY WITH PROPOFOL  N/A 12/08/2014   Procedure: COLONOSCOPY WITH PROPOFOL ;  Surgeon: Deveron Fly, MD;  Location: [facility] ENDOSCOPY;  Service: Endoscopy;  Laterality: N/A;    ESOPHAGOGASTRODUODENOSCOPY     TRACHEOSTOMY TUBE PLACEMENT N/A 06/19/2022   Procedure: TRACHEOSTOMY;  Surgeon: Lesly Raspberry, MD;  Location: [facility] ORS;  Service: ENT;  Laterality: N/A;    Family Psychiatric History: I have reviewed family psychiatric history from progress note on 02/14/2023.  Family History:  Family History  Problem Relation Age of Onset   Breast cancer Maternal Aunt    Breast cancer Paternal Aunt 19   Breast cancer Maternal Grandmother    Cancer Paternal Grandmother        Colon    Social History: I have reviewed social history from progress note on 02/14/2023. Social History   Socioeconomic History   Marital status: Single    Spouse name: Not on file   Number of children: 2   Years of education: Not on file   Highest education level: Some college, no degree  Occupational History   Not on file  Tobacco Use   Smoking status: Every Day    Current packs/day: 2.00    Types: Cigarettes   Smokeless tobacco: Never   Tobacco comments:    2 PPD - 10/25/2022 khj  Vaping Use   Vaping status: Never Used  Substance and Sexual Activity   Alcohol use: No   Drug use: No   Sexual activity: Not Currently    Birth control/protection: None  Other Topics Concern   Not on file  Social History Narrative   Not on file   Social Drivers of Health   Financial Resource Strain: High Risk (05/24/2023)   Overall Financial Resource Strain (CARDIA)    Difficulty of Paying Living Expenses: Very hard  Food Insecurity: Food Insecurity Present (05/24/2023)   Hunger Vital Sign    Worried About Running Out of Food in the Last Year: Sometimes true    Ran Out of Food in the Last Year: Sometimes true  Transportation Needs: Unmet Transportation Needs (05/24/2023)   PRAPARE - Transportation    Lack of Transportation (Medical): Yes    Lack of Transportation (Non-Medical): Yes  Physical Activity: Inactive (05/24/2023)   Exercise Vital Sign    Days of Exercise per Week: 0 days    Minutes of  Exercise per Session: 0 min  Stress: Stress Concern Present (05/24/2023)   [employer] of Occupational Health - Occupational Stress Questionnaire    Feeling of Stress : Very much  Social Connections: Socially Isolated (05/24/2023)   Social Connection and Isolation Panel [NHANES]    Frequency of Communication with Friends and Family: Never    Frequency of Social Gatherings with Friends and Family: Never    Attends Religious Services: Never    Database administrator or Organizations: Yes    Attends Banker Meetings: Never    Marital Status: Never married    Allergies:  Allergies  Allergen Reactions   Medroxyprogesterone  Other (See Comments)    Sores in the head, back.  Wt. Gain.   Abilify [Aripiprazole] Other (See Comments)    Syncope   Effexor [Venlafaxine] Other (See Comments)    Hair Loss   Wellbutrin [Bupropion]  Other (See Comments)    Acne     Metabolic Disorder Labs: Lab Results  Component Value Date   HGBA1C 9.0 (H) 06/07/2022   MPG 211.6 06/07/2022   Lab Results  Component Value Date   PROLACTIN 10.1 05/23/2017   Lab Results  Component Value Date   CHOL 134 06/07/2022   TRIG 103 06/26/2022   HDL 34 (L) 06/07/2022   CHOLHDL 3.9 06/07/2022   VLDL 17 06/07/2022   LDLCALC 83 06/07/2022   Lab Results  Component Value Date   TSH 0.772 06/06/2022   TSH 1.170 05/23/2017    Therapeutic Level Labs: No results found for: "LITHIUM" No results found for: "VALPROATE" No results found for: "CBMZ"  Current Medications: Current Outpatient Medications  Medication Sig Dispense Refill   acetaminophen  (TYLENOL ) 325 MG tablet Take 1-2 tablets (325-650 mg total) by mouth every 4 (four) hours as needed for mild pain.     albuterol  (PROVENTIL  HFA;VENTOLIN  HFA) 108 (90 BASE) MCG/ACT inhaler Inhale into the lungs every 6 (six) hours as needed for wheezing or shortness of breath.     albuterol  (VENTOLIN  HFA) 108 (90 Base) MCG/ACT inhaler Inhale into the lungs  every 6 (six) hours as needed for shortness of breath.     atorvastatin  (LIPITOR) 40 MG tablet Take 1 tablet (40 mg total) by mouth at bedtime. 30 tablet 0   cetirizine (ZYRTEC) 10 MG tablet Take 10 mg by mouth daily.     cyanocobalamin 1000 MCG tablet Take 1,000 mcg by mouth daily.     enalapril  (VASOTEC ) 20 MG tablet Take 20 mg by mouth 2 (two) times daily at 10 AM and 5 PM.     famotidine  (PEPCID ) 20 MG tablet Take 1 tablet (20 mg total) by mouth 2 (two) times daily. 60 tablet 0   fluticasone  (FLOVENT  HFA) 110 MCG/ACT inhaler Inhale 2 puffs into the lungs 2 (two) times daily. 12 g 0   furosemide  (LASIX ) 40 MG tablet Take 1 tablet (40 mg total) by mouth daily. 30 tablet 0   gabapentin (NEURONTIN) 300 MG capsule Take 1 capsule (300 mg total) by mouth 3 (three) times daily.     hydrOXYzine  (ATARAX ) 25 MG tablet Take 1 tablet (25 mg total) by mouth 3 (three) times daily as needed. 270 tablet 1   insulin  aspart (NOVOLOG ) 100 UNIT/ML FlexPen Inject 7 Units into the skin 3 (three) times daily with meals. 15 mL 0   insulin  glargine (LANTUS ) 100 UNIT/ML Solostar Pen Inject 30 Units into the skin 2 (two) times daily. (Patient taking differently: Inject 50 Units into the skin at bedtime.) 15 mL 0   Insulin  Pen Needle 32G X 4 MM MISC Use 4 (four) times daily with insulin  pen. 100 each 0   ipratropium (ATROVENT  HFA) 17 MCG/ACT inhaler Inhale 2 puffs into the lungs every 4 (four) hours as needed for wheezing.     KLOR-CON  M10 10 MEQ tablet Take 10 mEq by mouth daily.     melatonin 5 MG TABS Take 1 tablet (5 mg total) by mouth at bedtime as needed. 30 tablet 0   Multiple Vitamin (MULTIVITAMIN WITH MINERALS) TABS tablet Take 1 tablet by mouth daily. 30 tablet 0   potassium chloride  (KLOR-CON ) 10 MEQ tablet Take 10 mEq by mouth daily.     sertraline  (ZOLOFT ) 100 MG tablet Take 2 tablets (200 mg total) by mouth daily with breakfast. 180 tablet 0   traZODone  (DESYREL ) 100 MG tablet Take 1.5 tablets (150 mg total)  by mouth at bedtime. 45 tablet 1   varenicline  (CHANTIX ) 1 MG tablet Take 1 tablet (1 mg total) by mouth 2 (two) times daily. 60 tablet 3   No current facility-administered medications for this visit.     Musculoskeletal: Strength & Muscle Tone: UTA Gait & Station: Seated Patient leans: N/A  Psychiatric Specialty Exam: Review of Systems  Psychiatric/Behavioral:  The patient is nervous/anxious.     There were no vitals taken for this visit.There is no height or weight on file to calculate BMI.  General Appearance: Casual  Eye Contact:  Fair  Speech:  Clear and Coherent  Volume:  Normal  Mood:  Anxious  Affect:  Appropriate  Thought Process:  Goal Directed and Descriptions of Associations: Intact  Orientation:  Full (Time, Place, and Person)  Thought Content: Logical   Suicidal Thoughts:  No  Homicidal Thoughts:  No  Memory:  Immediate;   Fair Recent;   Fair Remote;   Fair  Judgement:  Fair  Insight:  Fair  Psychomotor Activity:  Normal  Concentration:  Concentration: Fair and Attention Span: Fair  Recall:  [employer] of Knowledge: Fair  Language: Fair  Akathisia:  No  Handed:  Right  AIMS (if indicated): not done  Assets:  Communication Skills Desire for Improvement Housing Social Support Transportation  ADL's:  Intact  Cognition: WNL  Sleep:  Fair   Screenings: GAD-7    Advertising copywriter from 05/24/2023 in [facility] Office Visit from 03/20/2023 in [facility] Office Visit from 02/14/2023 in [facility]  Total GAD-7 Score 15 13 10       PHQ2-9    Flowsheet Row Video Visit from 10/26/2023 in [facility] Office Visit from 07/09/2023 in [facility] Counselor from 05/24/2023 in [facility] Office Visit from 03/20/2023 in  [facility] Office Visit from 02/14/2023 in [facility]  PHQ-2 Total Score 0 5 6 4 4   PHQ-9 Total Score -- 18 21 12 18       Flowsheet Row Video Visit from 10/26/2023 in [facility] Video Visit from 09/28/2023 in [facility] Video Visit from 08/10/2023 in [facility]  C-SSRS RISK CATEGORY No Risk No Risk No Risk        Assessment and Plan: MALASHA KLEPPE is a 48 year old African-American female, single, lives in [place], has a history of MDD, PTSD, multiple medical problems including sleep apnea was evaluated by telemedicine today.  Discussed assessment and plan as noted below.  Major depressive disorder in remission Currently denies any significant sadness, hopelessness send current medication regimen is beneficial. Continue Sertraline  200 mg daily Continue Trazodone  150 mg at bedtime  Posttraumatic stress disorder-improving Continues to struggle with anxiety likely related to her previous history of trauma.  She does also have intrusive memories mostly triggered by her situational stressors.  She is currently engaged in therapy and is interested in continuing the same. Continue psychotherapy sessions with Ms. Deetta Farrow Continue Trazodone  150 mg at bedtime Continue Sertraline  as prescribed Continue Melatonin 5 mg at bedtime as needed  Bereavement-improving Currently coping with grief better than before. Continue psychotherapy sessions Continue Hydroxyzine  25 mg 3 times a day as needed  Tobacco use disorder-unstable She continues to smoke cigarettes, interested in quitting.  Currently  compliant on Chantix . Continue Chantix  1 mg twice daily Continue follow-up with pulmonologist as needed.  Follow-up Follow-up in clinic in 2 to 3 months or sooner in person.  Collaboration of Care:  Collaboration of Care: Referral or follow-up with counselor/therapist AEB encouraged to continue CBT.  Patient/Guardian was advised Release of Information must be obtained prior to any record release in order to collaborate their care with an outside provider. Patient/Guardian was advised if they have not already done so to contact the registration department to sign all necessary forms in order for us  to release information regarding their care.   Consent: Patient/Guardian gives verbal consent for treatment and assignment of benefits for services provided during this visit. Patient/Guardian expressed understanding and agreed to proceed.   This note was generated in part or whole with voice recognition software. Voice recognition is usually quite accurate but there are transcription errors that can and very often do occur. I apologize for any typographical errors that were not detected and corrected.    Amandamarie Feggins, MD 10/26/2023, 12:16 PM

## 2023-10-29 IMAGING — CR DG CHEST 2V
1 series · 2 of 2 positions shown · non-contrast
Comparison: Radiograph 07/06/2021

CLINICAL DATA: Mild intermittent asthma with exacerbation. Patient
reports breathing difficulties. Back pain with inspiration.

EXAM:
CHEST - 2 VIEW

[Series 1: dg chest 2 view · 0.14mm/px · 2 of 2 slices shown]
[im 1/2]
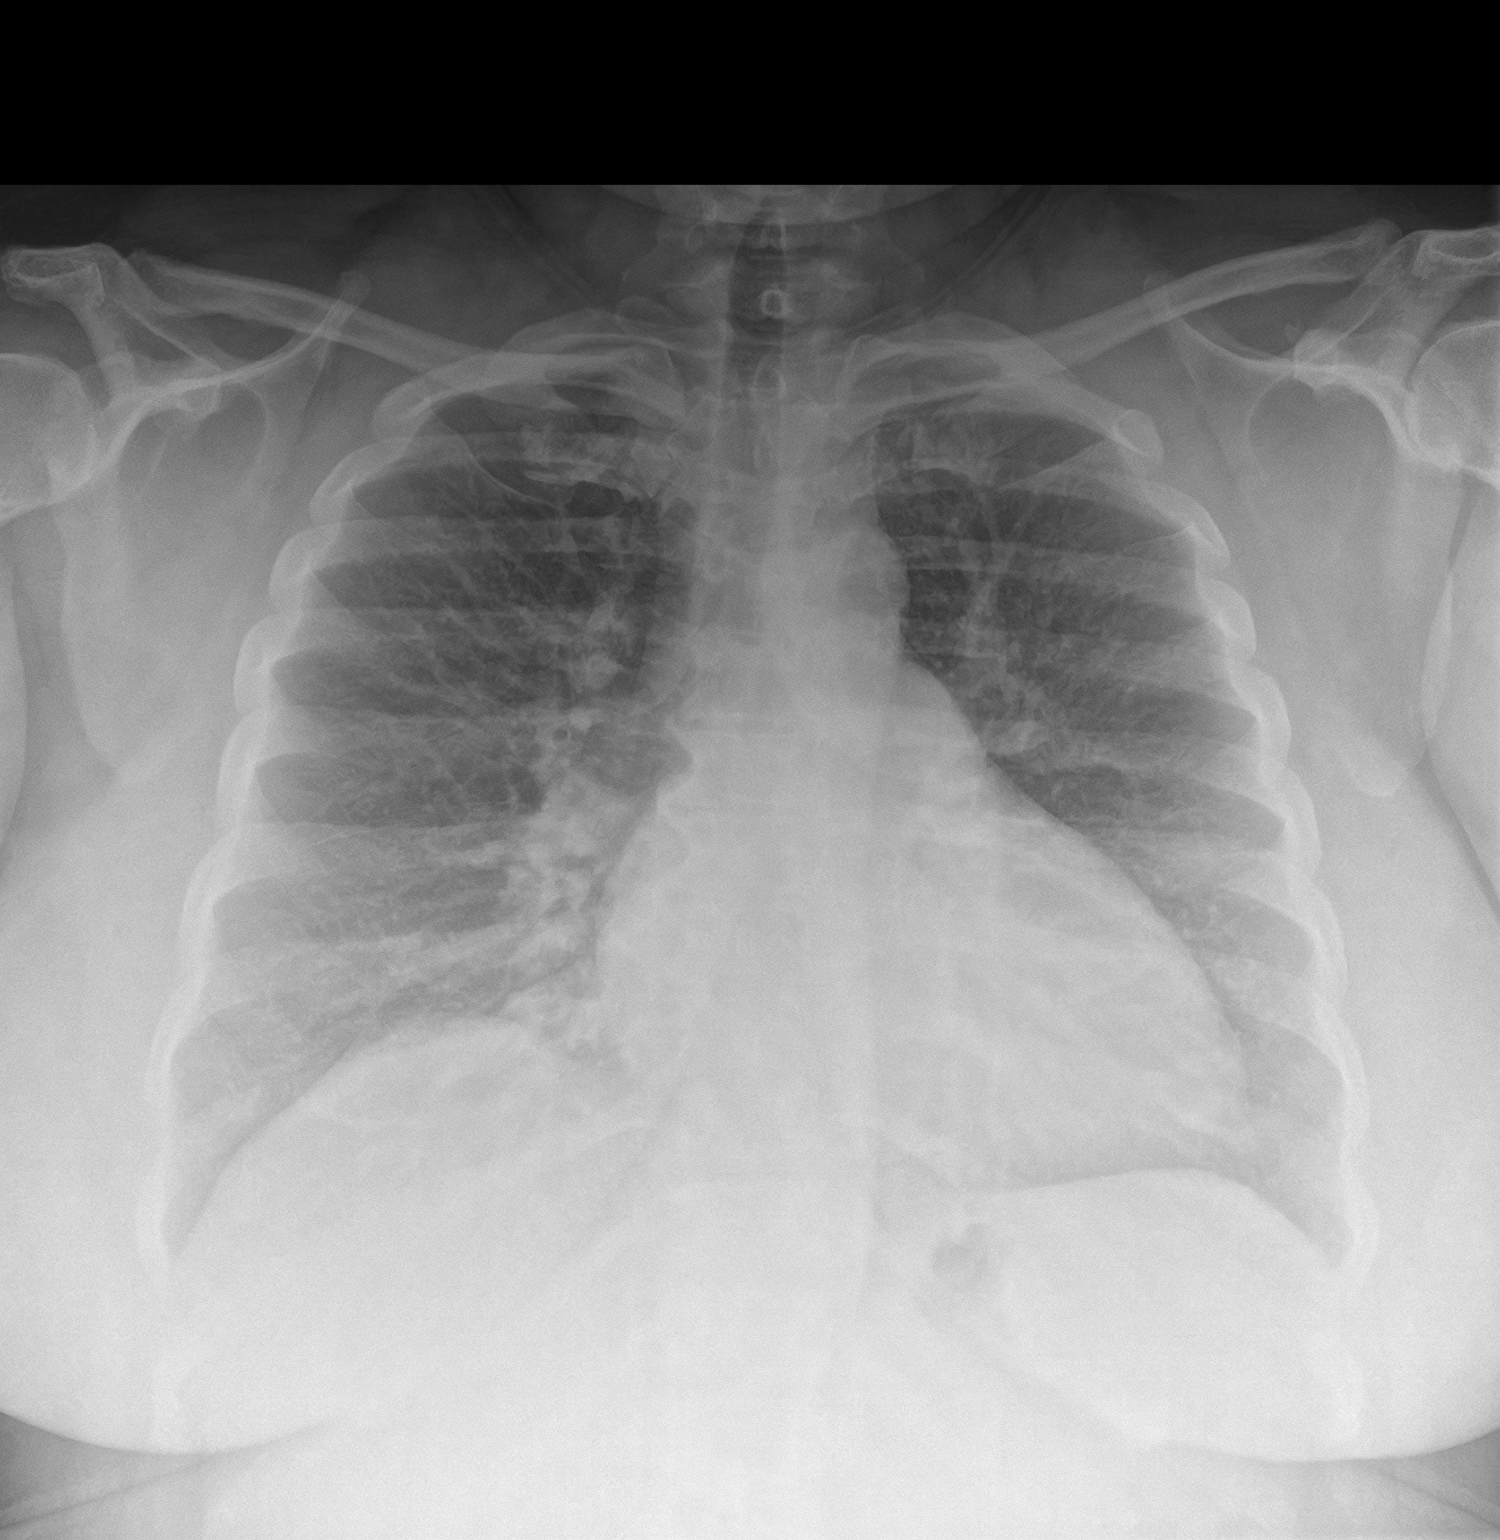
[im 2/2]
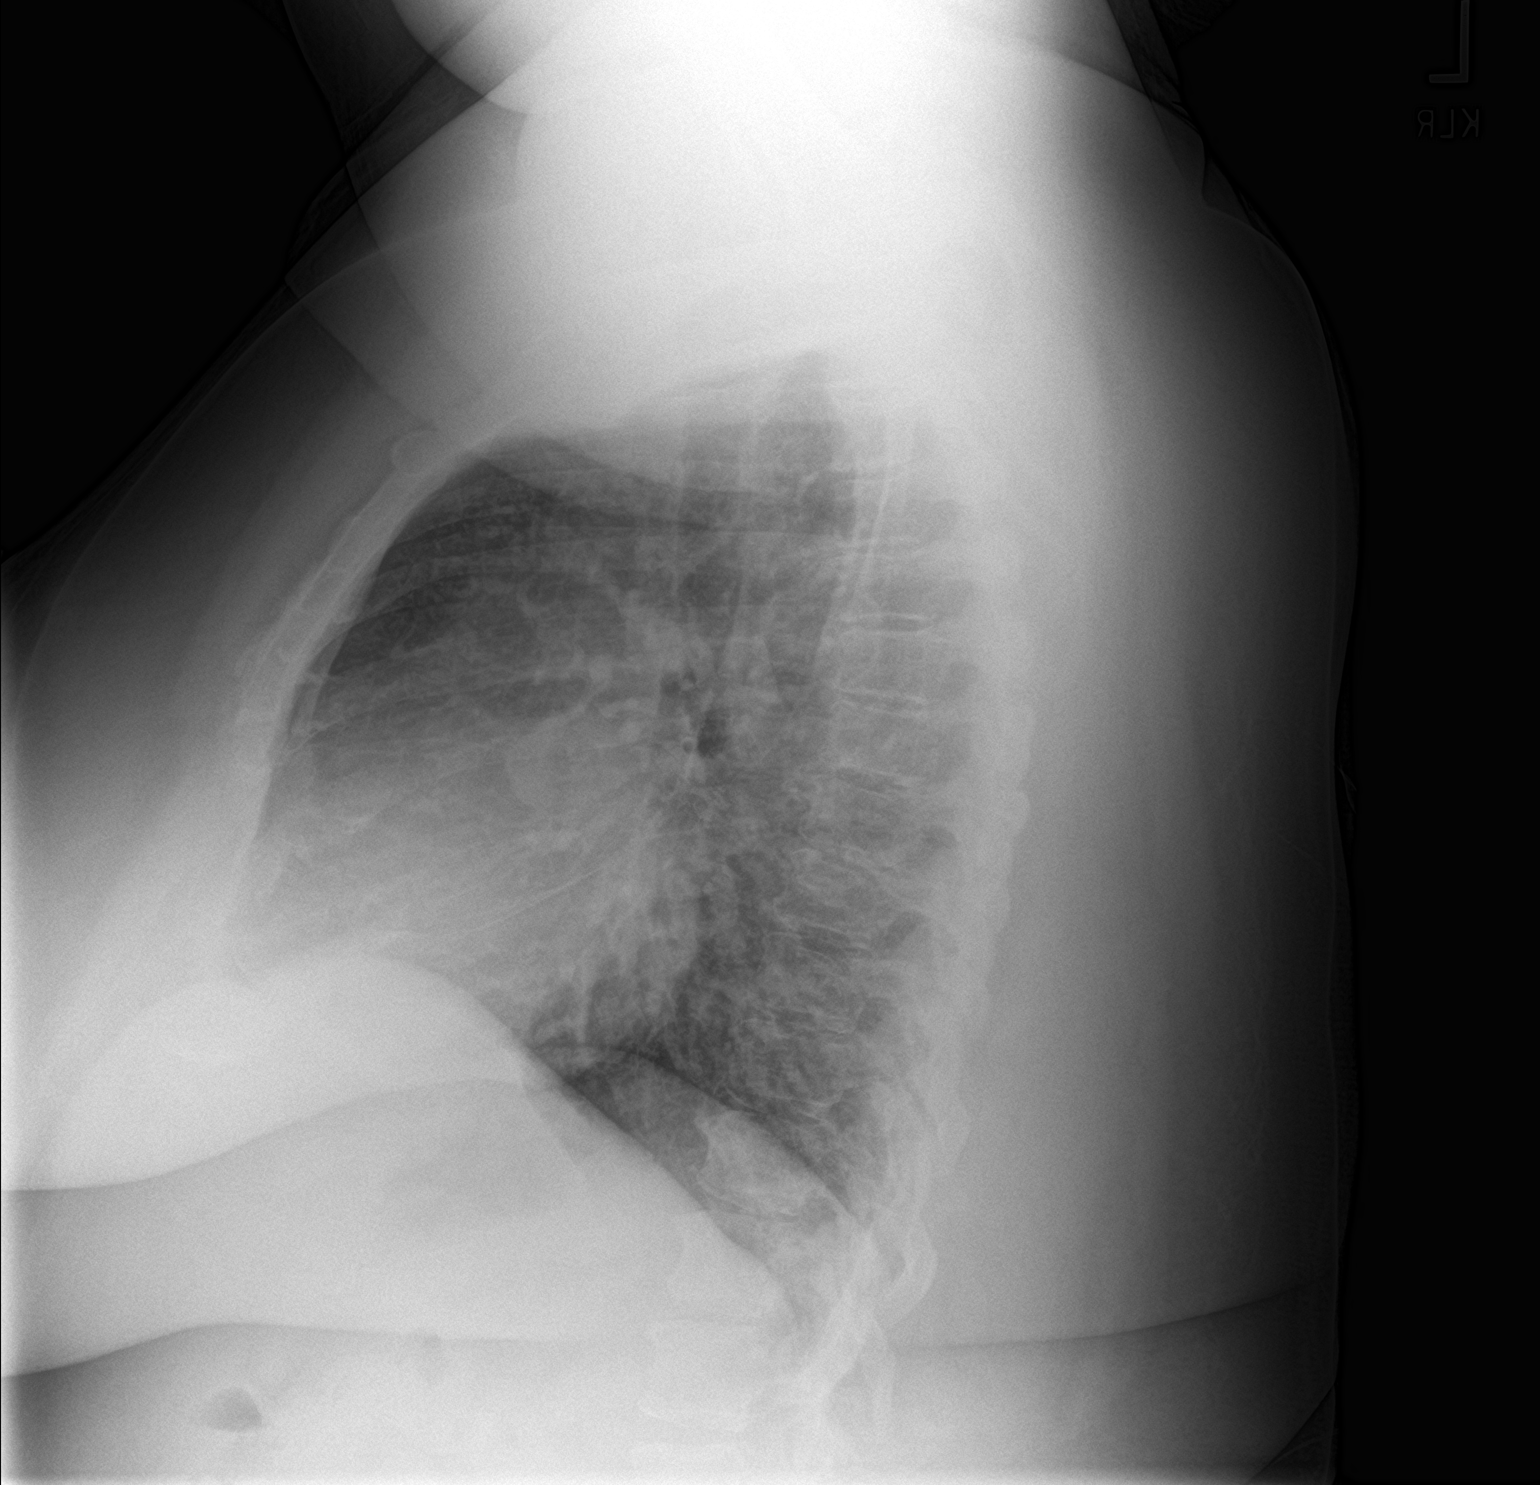

[2 of 2 positions shown; findings below may reference images not displayed]

FINDINGS: The cardiomediastinal contours are normal. Peribronchial thickening
is not significantly changed from prior exam. Pulmonary vasculature
is normal. No consolidation, pleural effusion, or pneumothorax.
Midthoracic spondylosis. No acute osseous abnormalities are seen.
IMPRESSION: Unchanged peribronchial thickening suggestive of asthma or
bronchitis, no significant change from prior exam.

## 2023-10-31 ENCOUNTER — Ambulatory Visit: Admitting: Internal Medicine

## 2023-10-31 ENCOUNTER — Encounter

## 2023-11-02 ENCOUNTER — Other Ambulatory Visit: Payer: Self-pay

## 2023-11-02 DIAGNOSIS — J449 Chronic obstructive pulmonary disease, unspecified: Secondary | ICD-10-CM

## 2023-11-19 ENCOUNTER — Encounter

## 2023-11-19 ENCOUNTER — Ambulatory Visit: Admitting: Internal Medicine

## 2023-11-26 ENCOUNTER — Ambulatory Visit: Admitting: Professional Counselor

## 2023-11-28 ENCOUNTER — Telehealth: Payer: Self-pay | Admitting: Professional Counselor

## 2023-11-28 NOTE — Telephone Encounter (Signed)
 Patient was given attendance notice in May. She then called 15 minutes prior to her appointment on 11-26-23 to reschedule, but is still a no show. She has been dismissed to therapy per the attendance policy and patient notified on 11-28-23

## 2023-12-03 ENCOUNTER — Other Ambulatory Visit: Payer: Self-pay | Admitting: Psychiatry

## 2023-12-03 DIAGNOSIS — F322 Major depressive disorder, single episode, severe without psychotic features: Secondary | ICD-10-CM

## 2023-12-03 DIAGNOSIS — F4310 Post-traumatic stress disorder, unspecified: Secondary | ICD-10-CM

## 2024-01-31 ENCOUNTER — Encounter: Payer: Self-pay | Admitting: Psychiatry

## 2024-01-31 ENCOUNTER — Ambulatory Visit (INDEPENDENT_AMBULATORY_CARE_PROVIDER_SITE_OTHER): Admitting: Psychiatry

## 2024-01-31 VITALS — BP 140/96 | HR 94 | Temp 98.3°F | Ht 59.0 in | Wt 287.8 lb

## 2024-01-31 DIAGNOSIS — F17200 Nicotine dependence, unspecified, uncomplicated: Secondary | ICD-10-CM

## 2024-01-31 DIAGNOSIS — F331 Major depressive disorder, recurrent, moderate: Secondary | ICD-10-CM

## 2024-01-31 DIAGNOSIS — F4310 Post-traumatic stress disorder, unspecified: Secondary | ICD-10-CM

## 2024-01-31 DIAGNOSIS — Z634 Disappearance and death of family member: Secondary | ICD-10-CM | POA: Diagnosis not present

## 2024-01-31 MED ORDER — SERTRALINE HCL 100 MG PO TABS: 200.0000 mg | ORAL_TABLET | Freq: Every day | ORAL | 0 refills | Status: AC

## 2024-01-31 MED ORDER — BUSPIRONE HCL 10 MG PO TABS: 10.0000 mg | ORAL_TABLET | Freq: Three times a day (TID) | ORAL | 1 refills | Status: AC

## 2024-01-31 NOTE — Progress Notes (Signed)
 [facility] MD OP Progress Note  01/31/2024 11:38 AM Desiree Stone  MRN:  [id number]  Chief Complaint:  Chief Complaint  Patient presents with   Follow-up   Anxiety   Depression   Medication Refill   Discussed the use of AI scribe software for clinical note transcription with the patient, who gave verbal consent to proceed.  History of Present Illness Desiree Stone is a 48 year old American female, single, lives in [place], has a history of MDD, PTSD, asthma, chronic respiratory failure, chronic bronchiolitis, diabetes mellitus, obstructive sleep apnea on CPAP, obesity, history of respiratory failure, congestive heart failure requiring intubation, tracheostomy ( 05/30/2022) was evaluated in office today for a follow-up appointment.  Over the past 2 weeks, she has experienced significant anxiety, which she rates as high and notes has continued with her current medication regimen. Her daughter and father have noticed her restlessness and have commented on her fidgeting behaviors, such as moving around in her rolling chair. She uses hydroxyzine  as needed for acute symptoms. Her current medications include sertraline  200 mg daily, gabapentin  300 mg three times daily, and hydroxyzine  as needed for anxiety. After missing an appointment, she is no longer seeing her previous therapist and acknowledges the need to find a new therapist.  Regarding depressive symptoms, she describes a period of low mood and difficulty adjusting following the loss of her car, identifying this as a situational stressor. Nearly every day over the past 2 weeks, she has experienced a lack of interest in activities and notes difficulty with motivation, particularly related to seeking employment. Unsuccessful job applications and interviews have contributed to her low mood, and she describes feeling disappointed. She notes some difficulty with concentration when trying to read or watch television. She denies suicidal ideation.  She  has also experienced sleep disturbance. She states that her sleep improves when she wants to sleep, but on 2 or 3 nights in the past 2 weeks, she did not want to sleep and chose to stay up to watch movies. She takes trazodone  nightly for sleep and reports that it helps when she needs to sleep. Occasionally, she takes only half a dose if she does not want to be fully sedated. She continues to use her CPAP every night as part of her sleep routine.  She reports ongoing tobacco use and has not yet stopped smoking. She is currently taking Chantix  as part of her efforts to quit.     Visit Diagnosis:    ICD-10-CM   1. Moderate episode of recurrent major depressive disorder (HCC)  F33.1 busPIRone  (BUSPAR ) 10 MG tablet    2. PTSD (post-traumatic stress disorder)  F43.10 busPIRone  (BUSPAR ) 10 MG tablet    sertraline  (ZOLOFT ) 100 MG tablet    3. Bereavement  Z63.4     4. Tobacco use disorder  F17.200       Past Psychiatric History: I have reviewed past psychiatric history from progress note on 02/14/2023.  Past trials of Cymbalta, venlafaxine, Abilify.  Past Medical History:  Past Medical History:  Diagnosis Date   Asthma    Bipolar disorder (HCC)    Chronic back pain    Diabetes mellitus without complication (HCC)    Hypertension    Iron  deficiency anemia    Leiomyoma    s/p uterine artery emoblization   Sleep apnea    Tobacco use disorder     Past Surgical History:  Procedure Laterality Date   carpel tunnel Bilateral    COLONOSCOPY WITH PROPOFOL  N/A 12/08/2014  Procedure: COLONOSCOPY WITH PROPOFOL ;  Surgeon: Gladis RAYMOND Mariner, MD;  Location: [facility];  Service: Endoscopy;  Laterality: N/A;   ESOPHAGOGASTRODUODENOSCOPY     TRACHEOSTOMY TUBE PLACEMENT N/A 06/19/2022   Procedure: TRACHEOSTOMY;  Surgeon: Herminio Miu, MD;  Location: [facility] ORS;  Service: ENT;  Laterality: N/A;    Family Psychiatric History: I have reviewed family psychiatric history from progress note on  02/14/2023.  Family History:  Family History  Problem Relation Age of Onset   Breast cancer Maternal Aunt    Breast cancer Paternal Aunt 93   Breast cancer Maternal Grandmother    Cancer Paternal Grandmother        Colon    Social History: I have reviewed social history from progress note on 02/14/2023. Social History   Socioeconomic History   Marital status: Single    Spouse name: Not on file   Number of children: 2   Years of education: Not on file   Highest education level: Some college, no degree  Occupational History   Not on file  Tobacco Use   Smoking status: Every Day    Current packs/day: 2.00    Types: Cigarettes   Smokeless tobacco: Never   Tobacco comments:    2 PPD - 10/25/2022 khj  Vaping Use   Vaping status: Never Used  Substance and Sexual Activity   Alcohol use: No   Drug use: No   Sexual activity: Not Currently    Birth control/protection: None  Other Topics Concern   Not on file  Social History Narrative   Not on file   Social Drivers of Health   Financial Resource Strain: High Risk (05/24/2023)   Overall Financial Resource Strain (CARDIA)    Difficulty of Paying Living Expenses: Very hard  Food Insecurity: Food Insecurity Present (05/24/2023)   Hunger Vital Sign    Worried About Running Out of Food in the Last Year: Sometimes true    Ran Out of Food in the Last Year: Sometimes true  Transportation Needs: Unmet Transportation Needs (05/24/2023)   PRAPARE - Transportation    Lack of Transportation (Medical): Yes    Lack of Transportation (Non-Medical): Yes  Physical Activity: Inactive (05/24/2023)   Exercise Vital Sign    Days of Exercise per Week: 0 days    Minutes of Exercise per Session: 0 min  Stress: Stress Concern Present (05/24/2023)   [employer] of Occupational Health - Occupational Stress Questionnaire    Feeling of Stress : Very much  Social Connections: Socially Isolated (05/24/2023)   Social Connection and Isolation Panel     Frequency of Communication with Friends and Family: Never    Frequency of Social Gatherings with Friends and Family: Never    Attends Religious Services: Never    Database administrator or Organizations: Yes    Attends Banker Meetings: Never    Marital Status: Never married    Allergies:  Allergies  Allergen Reactions   Medroxyprogesterone  Other (See Comments)    Sores in the head, back.  Wt. Gain.   Abilify [Aripiprazole] Other (See Comments)    Syncope   Effexor [Venlafaxine] Other (See Comments)    Hair Loss   Wellbutrin [Bupropion] Other (See Comments)    Acne     Metabolic Disorder Labs: Lab Results  Component Value Date   HGBA1C 9.0 (H) 06/07/2022   MPG 211.6 06/07/2022   Lab Results  Component Value Date   PROLACTIN 10.1 05/23/2017   Lab Results  Component Value  Date   CHOL 134 06/07/2022   TRIG 103 06/26/2022   HDL 34 (L) 06/07/2022   CHOLHDL 3.9 06/07/2022   VLDL 17 06/07/2022   LDLCALC 83 06/07/2022   Lab Results  Component Value Date   TSH 0.772 06/06/2022   TSH 1.170 05/23/2017    Therapeutic Level Labs: No results found for: LITHIUM No results found for: VALPROATE No results found for: CBMZ  Current Medications: Current Outpatient Medications  Medication Sig Dispense Refill   acetaminophen  (TYLENOL ) 325 MG tablet Take 1-2 tablets (325-650 mg total) by mouth every 4 (four) hours as needed for mild pain.     albuterol  (PROVENTIL  HFA;VENTOLIN  HFA) 108 (90 BASE) MCG/ACT inhaler Inhale into the lungs every 6 (six) hours as needed for wheezing or shortness of breath.     albuterol  (VENTOLIN  HFA) 108 (90 Base) MCG/ACT inhaler Inhale into the lungs every 6 (six) hours as needed for shortness of breath.     atorvastatin  (LIPITOR) 40 MG tablet Take 1 tablet (40 mg total) by mouth at bedtime. 30 tablet 0   busPIRone  (BUSPAR ) 10 MG tablet Take 1 tablet (10 mg total) by mouth 3 (three) times daily. 90 tablet 1   cetirizine (ZYRTEC) 10 MG  tablet Take 10 mg by mouth daily.     cyanocobalamin 1000 MCG tablet Take 1,000 mcg by mouth daily.     enalapril  (VASOTEC ) 20 MG tablet Take 20 mg by mouth 2 (two) times daily at 10 AM and 5 PM.     famotidine  (PEPCID ) 20 MG tablet Take 1 tablet (20 mg total) by mouth 2 (two) times daily. 60 tablet 0   fluticasone  (FLOVENT  HFA) 110 MCG/ACT inhaler Inhale 2 puffs into the lungs 2 (two) times daily. 12 g 0   furosemide  (LASIX ) 40 MG tablet Take 1 tablet (40 mg total) by mouth daily. 30 tablet 0   gabapentin  (NEURONTIN ) 300 MG capsule Take 1 capsule (300 mg total) by mouth 3 (three) times daily.     hydrOXYzine  (ATARAX ) 25 MG tablet Take 1 tablet (25 mg total) by mouth 3 (three) times daily as needed. 270 tablet 1   insulin  aspart (NOVOLOG ) 100 UNIT/ML FlexPen Inject 7 Units into the skin 3 (three) times daily with meals. 15 mL 0   insulin  glargine (LANTUS ) 100 UNIT/ML Solostar Pen Inject 30 Units into the skin 2 (two) times daily. (Patient taking differently: Inject 50 Units into the skin at bedtime.) 15 mL 0   ipratropium (ATROVENT  HFA) 17 MCG/ACT inhaler Inhale 2 puffs into the lungs every 4 (four) hours as needed for wheezing.     KLOR-CON  M10 10 MEQ tablet Take 10 mEq by mouth daily.     melatonin 5 MG TABS Take 1 tablet (5 mg total) by mouth at bedtime as needed. 30 tablet 0   Multiple Vitamin (MULTIVITAMIN WITH MINERALS) TABS tablet Take 1 tablet by mouth daily. 30 tablet 0   Multiple Vitamins-Minerals (MULTIVITAMIN GUMMIES WOMENS PO) Multivitamin 50 Plus tablet     potassium chloride  (KLOR-CON ) 10 MEQ tablet Take 10 mEq by mouth daily.     traZODone  (DESYREL ) 100 MG tablet TAKE 1 AND 1/2 TABLETS(150 MG) BY MOUTH AT BEDTIME 45 tablet 1   TRUEPLUS 5-BEVEL PEN NEEDLES 31G X 6 MM MISC      varenicline  (CHANTIX ) 1 MG tablet Take 1 tablet (1 mg total) by mouth 2 (two) times daily. 60 tablet 3   sertraline  (ZOLOFT ) 100 MG tablet Take 2 tablets (200 mg total) by  mouth daily with breakfast. 180  tablet 0   No current facility-administered medications for this visit.     Musculoskeletal: Strength & Muscle Tone: within normal limits Gait & Station: normal Patient leans: N/A  Psychiatric Specialty Exam: Review of Systems  Psychiatric/Behavioral:  Positive for decreased concentration, dysphoric mood and sleep disturbance. The patient is nervous/anxious.     Blood pressure (!) 140/96, pulse 94, temperature 98.3 F (36.8 C), temperature source Temporal, height 4' 11 (1.499 m), weight 287 lb 12.8 oz (130.5 kg), SpO2 95%.Body mass index is 58.13 kg/m.  General Appearance: Casual  Eye Contact:  Good  Speech:  Clear and Coherent  Volume:  Normal  Mood:  Anxious and Depressed  Affect:  Congruent  Thought Process:  Goal Directed and Descriptions of Associations: Intact  Orientation:  Full (Time, Place, and Person)  Thought Content: Logical   Suicidal Thoughts:  No  Homicidal Thoughts:  No  Memory:  Immediate;   Fair Recent;   Fair Remote;   Fair  Judgement:  Fair  Insight:  Fair  Psychomotor Activity:  Normal  Concentration:  Concentration: Fair and Attention Span: Fair  Recall:  [employer] of Knowledge: Fair  Language: Fair  Akathisia:  No  Handed:  Right  AIMS (if indicated): not done  Assets:  Communication Skills Desire for Improvement Housing Social Support Transportation  ADL's:  Intact  Cognition: WNL  Sleep:  Poor   Screenings: GAD-7    Loss adjuster, chartered Office Visit from 01/31/2024 in [facility] Counselor from 05/24/2023 in [facility] Office Visit from 03/20/2023 in [facility] Office Visit from 02/14/2023 in [facility]  Total GAD-7 Score 17 15 13 10    PHQ2-9    Flowsheet Row Office Visit from 01/31/2024 in [facility] Video Visit from 10/26/2023 in [facility] Office Visit from 07/09/2023 in [facility] Counselor from 05/24/2023 in [facility] Office Visit from 03/20/2023 in [facility]  PHQ-2 Total Score 3 0 5 6 4   PHQ-9 Total Score 10 -- 18 21 12    Flowsheet Row Office Visit from 01/31/2024 in [facility] Video Visit from 10/26/2023 in [facility] Video Visit from 09/28/2023 in [facility]  C-SSRS RISK CATEGORY No Risk No Risk No Risk     Assessment and Plan: Desiree Stone is a 48 year old African-American female, single, lives in [place], has a history of MDD, PTSD, multiple medical problems including sleep apnea was evaluated in office today.  Discussed assessment and plan as noted below.  MDD-unstable Currently struggling with worsening depression symptoms.  Sleep problems due to lack of sleep hygiene.  Trazodone  does help when she takes it. Continue Sertraline  200 mg daily Start BuSpar  10 mg 3 times a day. Continue Trazodone  150 mg at bedtime. Encouraged to work on sleep hygiene techniques.  Posttraumatic stress disorder-improving Does have anxiety related to previous history of trauma although currently denies any significant intrusive memories nightmares or flashbacks.  Currently not in psychotherapy, was terminated from previous therapist due to noncompliance.  Patient is currently motivated to reestablish care with a new therapist Continue Trazodone  150 mg at bedtime Continue Sertraline  as prescribed Continue Hydroxyzine  25 mg 3 times a day as needed Patient advised to reestablish  care with therapist.  Provided resources.  Bereavement-improving Currently denies any concerns. Will reevaluate in future sessions.  Tobacco use  disorder-unstable Motivated to start working on quitting. Continue Chantix  1 mg twice daily  Patient provided information for more occasional rehabilitation services.   Follow-up Follow-up in clinic in 4 to 6 weeks or sooner if needed.   Collaboration of Care: Collaboration of Care: Referral or follow-up with counselor/therapist AEB encouraged to establish care with therapist.  Encouraged to follow-up with pulmonologist for shortness of breath/COPD  Patient/Guardian was advised Release of Information must be obtained prior to any record release in order to collaborate their care with an outside provider. Patient/Guardian was advised if they have not already done so to contact the registration department to sign all necessary forms in order for us  to release information regarding their care.   Consent: Patient/Guardian gives verbal consent for treatment and assignment of benefits for services provided during this visit. Patient/Guardian expressed understanding and agreed to proceed.   This note was generated in part or whole with voice recognition software. Voice recognition is usually quite accurate but there are transcription errors that can and very often do occur. I apologize for any typographical errors that were not detected and corrected.    Deshanti Adcox, MD 01/31/2024, 11:38 AM

## 2024-01-31 NOTE — Patient Instructions (Addendum)
 [facility] [address] [place], [place] [address]   For therapist see below list [email or web address]  www.psychologytoday  [email or web address] [facility] [address], [place], [place] [address]   [phone]  [facility] [email or web address] [address], [place], [place] [address]  [phone]  [facility], [facility] [email or web address] [address] [place], [place] [address]  [phone]   [facility] - [phone]  [facility] - [phone]  [facility] counseling - [phone] counseling [phone]  [facility] - [phone]    [facility] [address] [place], [place] [address]  [phone]    Desiree Stone [address] [place], [place] [address]  [phone]    [facility] [address] [place], [place] [address]  [phone]    Buspirone  Tablets What is this medication? BUSPIRONE  (byoo SPYE rone) treats anxiety. It works by balancing the levels of dopamine and serotonin in your brain, substances that help regulate mood. This medicine may be used for other purposes; ask your health care provider or pharmacist if you have questions. COMMON BRAND NAME(S): BuSpar , Buspar  Dividose What should I tell my care team before I take this medication? They need to know if you have any of these conditions: Kidney disease Liver disease An unusual or allergic reaction to buspirone , other medications, foods, dyes, or preservatives Pregnant or trying to get pregnant Breastfeeding How should I use this medication? Take this medication by mouth with water . Take it as directed on the prescription label. You can take it with or without food. You should always take it the same way. Keep taking it unless your  care team tells you to stop. Do not take this medication with foods or drinks that contain grapefruit. Talk to your care team about the use of this medication in children. Special care may be needed. Overdosage: If you think you have taken too much of this medicine contact a poison control center or emergency room at once. NOTE: This medicine is only for you. Do not share this medicine with others. What if I miss a dose? If you miss a dose, take it as soon as you can. If it is almost time for your next dose, take only that dose. Do not take double or extra doses. What may interact with this medication? Do not take this medication with any of the following: Linezolid  MAOIs like Carbex, Eldepryl, Marplan, Nardil, and Parnate Methylene blue Procarbazine This medication may also interact with the following: Alcohol Diazepam Digoxin Droperidol Grapefruit juice Haloperidol Metoclopramide Opioids Phenothiazines, such as chlorpromazine, prochlorperazine , thioridazine Some medications for depression, anxiety, or other mental health conditions Some medication for migraines, such as sumatriptan Stimulant medications for ADHD, weight loss, or staying awake Supplements, such as St. John's wort or tryptophan Tetrabenazine Other medications may affect the way this medication works. Talk with your care team about all the medications you take. They may suggest changes to your treatment plan to lower the risk of side effects and to make sure your medications work as intended. This list may not describe all possible interactions. Give your health care provider a list of all the medicines, herbs, non-prescription drugs, or dietary supplements you use. Also tell them if you smoke, drink alcohol, or use illegal drugs. Some items may interact with your medicine. What should I watch for  while using this medication? Visit your care team for regular checks on your progress. It may take 1 to 2 weeks before your  anxiety gets better. This medication may affect your coordination, reaction time, or judgment. Do not drive or operate machinery until you know how this medication affects you. Sit up or stand slowly to reduce the risk of dizzy or fainting spells. Drinking alcohol with this medication can increase the risk of these side effects. Serotonin syndrome is when your body has too much serotonin in it. This happens when this medication is used with other ones that increase serotonin levels. Common medications that increase serotonin levels are antidepressants, some medications for migraines, and some antibiotics. The symptoms of serotonin syndrome include irritability, confusion, fast or irregular heartbeat, muscle stiffness, twitching muscles, sweating, high fever, seizure, chills, vomiting and diarrhea. Contact your care team right away if you think you have serotonin syndrome. Talk to your care team about this medication if you are breastfeeding. There are benefits and risks to taking medications while breastfeeding. Your care team can help you find the option that works for you. What side effects may I notice from receiving this medication? Side effects that you should report to your care team as soon as possible: Allergic reactions--skin rash, itching, hives, swelling of the face, lips, tongue, or throat Irritability, confusion, fast or irregular heartbeat, muscle stiffness, twitching muscles, sweating, high fever, seizure, chills, vomiting, diarrhea, which may be signs of serotonin syndrome Side effects that usually do not require medical attention (report to your care team if they continue or are bothersome): Anxiety, nervousness Dizziness Drowsiness Headache Nausea Trouble sleeping This list may not describe all possible side effects. Call your doctor for medical advice about side effects. You may report side effects to FDA at 1-800-FDA-1088. Where should I keep my medication? Keep out of reach of  children and pets. Store at room temperature between 20 and 25 degrees C (68 and 77 degrees F). Get rid of any unused medication after the expiration date. To get rid of medications that are no longer needed or expired: Take the medication to a take-back program. Check with your pharmacy or law enforcement to find a location. If you cannot return the medication, check the label or package insert to see if the medication should be thrown out in the garbage or flushed down the toilet. If you are not sure, ask your care team. If it is safe to put it in the trash, empty the medication out of the container. Mix it with cat litter, dirt, coffee grounds, or another unwanted substance. Seal the mixture in a bag or container. Put it in the trash. NOTE: This sheet is a summary. It may not cover all possible information. If you have questions about this medicine, talk to your doctor, pharmacist, or health care provider.  2025 Elsevier/Gold Standard (2023-10-16 00:00:00) ]

## 2024-02-07 ENCOUNTER — Encounter: Payer: Self-pay | Admitting: Internal Medicine

## 2024-03-14 ENCOUNTER — Telehealth: Admitting: Psychiatry

## 2024-03-14 DIAGNOSIS — Z91199 Patient's noncompliance with other medical treatment and regimen due to unspecified reason: Secondary | ICD-10-CM

## 2024-03-14 NOTE — Progress Notes (Signed)
 No response to call or text or video invite, unable to leave phone message .

## 2024-03-19 ENCOUNTER — Ambulatory Visit: Admitting: Cardiology

## 2024-03-20 ENCOUNTER — Ambulatory Visit: Attending: Cardiology | Admitting: Cardiology

## 2024-03-20 ENCOUNTER — Ambulatory Visit (INDEPENDENT_AMBULATORY_CARE_PROVIDER_SITE_OTHER)

## 2024-03-20 ENCOUNTER — Encounter: Payer: Self-pay | Admitting: Cardiology

## 2024-03-20 VITALS — BP 160/87 | HR 93 | Ht 59.0 in | Wt 286.0 lb

## 2024-03-20 DIAGNOSIS — R0602 Shortness of breath: Secondary | ICD-10-CM | POA: Diagnosis present

## 2024-03-20 DIAGNOSIS — E669 Obesity, unspecified: Secondary | ICD-10-CM | POA: Diagnosis present

## 2024-03-20 DIAGNOSIS — F4310 Post-traumatic stress disorder, unspecified: Secondary | ICD-10-CM | POA: Insufficient documentation

## 2024-03-20 DIAGNOSIS — R072 Precordial pain: Secondary | ICD-10-CM | POA: Insufficient documentation

## 2024-03-20 DIAGNOSIS — G4733 Obstructive sleep apnea (adult) (pediatric): Secondary | ICD-10-CM | POA: Diagnosis present

## 2024-03-20 DIAGNOSIS — R002 Palpitations: Secondary | ICD-10-CM | POA: Insufficient documentation

## 2024-03-20 DIAGNOSIS — E119 Type 2 diabetes mellitus without complications: Secondary | ICD-10-CM | POA: Diagnosis present

## 2024-03-20 DIAGNOSIS — M7989 Other specified soft tissue disorders: Secondary | ICD-10-CM | POA: Diagnosis present

## 2024-03-20 DIAGNOSIS — F17200 Nicotine dependence, unspecified, uncomplicated: Secondary | ICD-10-CM | POA: Insufficient documentation

## 2024-03-20 DIAGNOSIS — E782 Mixed hyperlipidemia: Secondary | ICD-10-CM | POA: Diagnosis present

## 2024-03-20 DIAGNOSIS — I10 Essential (primary) hypertension: Secondary | ICD-10-CM | POA: Insufficient documentation

## 2024-03-20 NOTE — Patient Instructions (Signed)
 Medication Instructions:  Your physician recommends that you continue on your current medications as directed. Please refer to the Current Medication list given to you today.   *If you need a refill on your cardiac medications before your next appointment, please call your pharmacy*  Lab Work: No labs ordered today  If you have labs (blood work) drawn today and your tests are completely normal, you will receive your results only by: MyChart Message (if you have MyChart) OR A paper copy in the mail If you have any lab test that is abnormal or we need to change your treatment, we will call you to review the results.  Testing/Procedures: Your physician has requested that you have a lower extremity venous duplex. This test is an ultrasound of the veins in the legs or arms. It looks at venous blood flow that carries blood from the heart to the legs or arms. Allow one hour for a Lower Venous exam. Allow thirty minutes for an Upper Venous exam. There are no restrictions or special instructions.This will take place at [address] [facility] [address]) [address], [place] [address]  Please note: We ask at that you not bring children with you during ultrasound (echo/ vascular) testing. Due to room size and safety concerns, children are not allowed in the ultrasound rooms during exams. Our front office staff cannot provide observation of children in our lobby area while testing is being conducted. An adult accompanying a patient to their appointment will only be allowed in the ultrasound room at the discretion of the ultrasound technician under special circumstances. We apologize for any inconvenience.   ZIO XT- Long Term Monitor Instructions  Your physician has requested you wear a ZIO patch monitor for 14 days.  This is a single patch monitor. [employer] supplies one patch monitor per enrollment. Additional stickers are not available. Please do not apply patch if you will be having a Nuclear Stress Test,   Echocardiogram, Cardiac CT, MRI, or Chest Xray during the period you would be wearing the  monitor. The patch cannot be worn during these tests. You cannot remove and re-apply the  ZIO XT patch monitor.  Your ZIO patch monitor will be mailed 3 day USPS to your address on file. It may take 3-5 days  to receive your monitor after you have been enrolled.  Once you have received your monitor, please review the enclosed instructions. Your monitor  has already been registered assigning a specific monitor serial # to you.  Billing and Patient Assistance Program Information  We have supplied [employer] with any of your insurance information on file for billing purposes. [employer] offers a sliding scale Patient Assistance Program for patients that do not have  insurance, or whose insurance does not completely cover the cost of the ZIO monitor.  You must apply for the Patient Assistance Program to qualify for this discounted rate.  To apply, please call [employer] at [phone], select option 4, select option 2, ask to apply for  Patient Assistance Program. Meredeth will ask your household income, and how many people  are in your household. They will quote your out-of-pocket cost based on that information.  [employer] will also be able to set up a 51-month, interest-free payment plan if needed.  Applying the monitor   Shave hair from upper left chest.  Hold abrader disc by orange tab. Rub abrader in 40 strokes over the upper left chest as  indicated in your monitor instructions.  Clean area with 4 enclosed alcohol  pads. Let dry.  Apply patch as indicated in monitor instructions. Patch will be placed under collarbone on left  side of chest with arrow pointing upward.  Rub patch adhesive wings for 2 minutes. Remove white label marked 1. Remove the white  label marked 2. Rub patch adhesive wings for 2 additional minutes.  While looking in a mirror, press and release button in center of patch. A small  green light will  flash 3-4 times. This will be your only indicator that the monitor has been turned on.  Do not shower for the first 24 hours. You may shower after the first 24 hours.  Press the button if you feel a symptom. You will hear a small click. Record Date, Time and  Symptom in the Patient Logbook.  When you are ready to remove the patch, follow instructions on the last 2 pages of Patient  Logbook. Stick patch monitor onto the last page of Patient Logbook.  Place Patient Logbook in the blue and white box. Use locking tab on box and tape box closed  securely. The blue and white box has prepaid postage on it. Please place it in the mailbox as  soon as possible. Your physician should have your test results approximately 7 days after the  monitor has been mailed back to [facility].  Call [facility] Customer Care at [phone] if you have questions regarding  your ZIO XT patch monitor. Call them immediately if you see an orange light blinking on your  monitor.  If your monitor falls off in less than 4 days, contact our Monitor department at [phone].  If your monitor becomes loose or falls off after 4 days call [employer] at [phone] for  suggestions on securing your monitor   Your provider has ordered a Lexiscan/ Exercise Myoview Stress test. This will take place at [facility]. Please report to the [facility] medical mall entrance. The volunteers at the first desk will direct you where to go.  [facility] MYOVIEW  Your provider has ordered a Stress Test with nuclear imaging. The purpose of this test is to evaluate the blood supply to your heart muscle. This procedure is referred to as a Non-Invasive Stress Test. This is because other than having an IV started in your vein, nothing is inserted or invades your body. Cardiac stress tests are done to find areas of poor blood flow to the heart by determining the extent of coronary artery disease (CAD). Some patients exercise on a treadmill,  which naturally increases the blood flow to your heart, while others who are unable to walk on a treadmill due to physical limitations will have a pharmacologic/chemical stress agent called Lexiscan . This medicine will mimic walking on a treadmill by temporarily increasing your coronary blood flow.   Please note: these test may take anywhere between 2-4 hours to complete  How to prepare for your Myoview test:  Nothing to eat for 6 hours prior to the test No caffeine for 24 hours prior to test No smoking 24 hours prior to test. Your medication may be taken with water .  If your doctor stopped a medication because of this test, do not take that medication. Ladies, please do not wear dresses.  Skirts or pants are appropriate. Please wear a short sleeve shirt. No perfume, cologne or lotion. Wear comfortable walking shoes. No heels!   PLEASE NOTIFY THE OFFICE AT LEAST 24 HOURS IN ADVANCE IF YOU ARE UNABLE TO KEEP YOUR APPOINTMENT.  [phone] AND  PLEASE NOTIFY NUCLEAR  MEDICINE AT [facility] AT LEAST 24 HOURS IN ADVANCE IF YOU ARE UNABLE TO KEEP YOUR APPOINTMENT. [phone]    Follow-Up: At [facility], you and your health needs are our priority.  As part of our continuing mission to provide you with exceptional heart care, our providers are all part of one team.  This team includes your primary Cardiologist (physician) and Advanced Practice Providers or APPs (Physician Assistants and Nurse Practitioners) who all work together to provide you with the care you need, when you need it.  Your next appointment:   6 week(s)  Provider:   You may see the following Advanced Practice Providers on your designated Care Team:   Tylene Lunch, NP   We recommend signing up for the patient portal called MyChart.  Sign up information is provided on this After Visit Summary.  MyChart is used to connect with patients for Virtual Visits (Telemedicine).  Patients are able to view lab/test results,  encounter notes, upcoming appointments, etc.  Non-urgent messages can be sent to your provider as well.   To learn more about what you can do with MyChart, go to [email or web address].

## 2024-03-20 NOTE — Progress Notes (Signed)
 Cardiology Office Note   Date:  03/20/2024  ID:  Zoriyah, Scheidegger 10-26-1975, MRN [id number] PCP: [facility]  [facility] Providers Cardiologist:  None     History of Present Illness Desiree Stone is a 48 y.o. female with a past medical history of hypertension, OSA, COPD, asthma, acute respiratory failure, type 2 diabetes, severe obesity, current smoker, who is here today to establish care with complaints of palpitations.   Previously was admitted to [facility] 1/16 - 07/13/2022 with debility, obstructive sleep apnea, central pretension, type 2 diabetes, asthma, difficulty coping, severe obesity, and COPD.  She presented with a 3-week history of productive cough, shortness of breath as well as lower extremity edema.  2D echo was done showing LVEF of 60 to 65% with biatrial dilatation.  CT of the chest showed cardiomegaly with a large passages of pulmonary hypertension reflux contrast into the IVC suggesting right heart dysfunction.  Respiratory status declined due to persistent acidosis requiring intubation.  Acute respiratory failure was in the setting of hypercapnia with hypoxia and possible CHF on arrival in setting of severe morbid obesity, COPD exacerbation and likely sleep apnea.  She was treated with IV diuresis and IV Solu-Medrol  as well as ceftriaxone  for community-acquired pneumonia.  Hospital course was significant for failure next patient is requiring tracheostomy.  She was started on tube feeds for nutritional support poorly controlled blood sugars returned without incident.  She was weaned to a liters of oxygen and downsized to an extra long #6 trach.  She was tolerating p.o. per speaking valve trials and was noted to have moderate hoarseness of voice with intermittent coughing.  She was able to be started on 81 pounds with strict aspiration precautions and was tolerated without signs and symptoms of aspiration.  Therapy was working with with her but she  continued to be limited by weakness, lower extremity numbness with prolonged sitting activity and fatigue with multiple rest breaks with activity as well as tachycardia with ambulating short distances.  CIR was recommended due to functional decline.  She was decannulated in February 2024.  She was admitted to inpatient rehab on 07/05/2022.  Inpatient therapies consisted of PT, ST, and OT at least 3 hours 5 days/week.  She progressed through rehab but was able to be discharged.  On discharge it was recommended that she have a referral to pulmonary as she needed to be retested for OSA for CPAP and continued management of her asthma and COPD.  She followed up with pulmonary 12/26/2022.  Further testing was reviewed and she had original sleep study in 2017 at [facility].  This showed moderate obstructive sleep apnea.  She did use CPAP nightly her current machine was from 2017 and she needed new supplies.  Unfortunately she still continues to smoke.  She did use Chantix  before and stated that it helped.  She had occasional cough and chest congestion and was using Flovent  does not need albuterol  much.  She was still suffering from trauma being hospitalized to being on the ventilator.  She had acute delirium while in the hospital and still had flashbacks.  Felt that she was more anxious and depressed.  She needed a new CPAP machine that was ordered as her device was greater than 30 years old.  She will need updated PFTs after she has stopped smoking.  She was referred to psychiatry for anxiety depression and PTSD after hospitalization.  She was also restarted on Chantix .  Recommendation was to follow-up in  3 months with pulmonary.   She presents to clinic today with several concerns today.  She states that she has been having swelling to her left lower extremity that is not within her right lower extremity.  She has been elevating that leg without any relief in the swelling and discomfort that she is having.  Left lower extremity  is visibly larger than the right lower extremity.  She states that she has been suffering from palpitations where her heart beating is slimy into her chest and she feels as though her heart is beating faster.  She has noted an increased amount of anxiety related to the palpitations.  She also has complaints of shortness of breath and dyspnea on exertion with minimal activity as she has been working hard with physical therapy over the past several years to get to where she is today as related in her history listed above.  She also has heavy nests in her chest on exertion and not with rest.  She does also state that she has numbness to her left lower extremity.  She states that she has been compliant with her CPAP and had stopped smoking for 7 months.  She states that she has not had any recent emergency department visits.  She states that she is compliant with her current medication regimen and recently just had her enalapril  increased by her primary care provider for elevated blood pressures.  ROS: 10 point review of systems has been reviewed and considered negative except ones been listed in the HPI  Studies Reviewed EKG Interpretation Date/Time:  Thursday March 20 2024 14:20:38 EDT Ventricular Rate:  93 PR Interval:  142 QRS Duration:  72 QT Interval:  394 QTC Calculation: 489 R Axis:   -9  Text Interpretation: Normal sinus rhythm When compared with ECG of 17-Jun-2022 04:27, PREVIOUS ECG IS PRESENT No significant change since last tracing Confirmed by Gerard Frederick ([id number]) on 03/20/2024 2:24:45 PM    2d echo 06/07/2022 1. Left ventricular ejection fraction, by estimation, is 60 to 65%. The  left ventricle has normal function. The left ventricle has no regional  wall motion abnormalities. There is mild concentric left ventricular  hypertrophy. Left ventricular diastolic  parameters were normal.   2. Right ventricular systolic function is normal. The right ventricular  size is normal.   3.  Left atrial size was mildly dilated.   4. Right atrial size was mildly dilated.   5. The mitral valve is normal in structure. Trivial mitral valve  regurgitation. No evidence of mitral stenosis.   6. The aortic valve is normal in structure. Aortic valve regurgitation is  not visualized. Aortic valve sclerosis is present, with no evidence of  aortic valve stenosis.   7. The inferior vena cava is normal in size with greater than 50%  respiratory variability, suggesting right atrial pressure of 3 mmHg.   Risk Assessment/Calculations     Physical Exam VS:  BP (!) 160/87 (BP Location: Left Wrist, Patient Position: Sitting, Cuff Size: Normal)   Pulse 93 Comment: 88 oximeter  Ht 4' 11 (1.499 m)   Wt 286 lb (129.7 kg)   SpO2 92%   BMI 57.76 kg/m        Wt Readings from Last 3 Encounters:  03/20/24 286 lb (129.7 kg)  12/26/22 285 lb (129.3 kg)  10/25/22 285 lb (129.3 kg)    GEN: Well nourished, well developed in no acute distress NECK: No JVD; No carotid bruits CARDIAC: RRR, no murmurs, rubs, gallops  RESPIRATORY:  Clear to auscultation without rales, wheezing or rhonchi  ABDOMEN: Soft, non-tender, non-distended EXTREMITIES:  No edema; No deformity   ASSESSMENT AND PLAN Precordial chest pain that happens with minimal exertion and is relieved with rest that has been ongoing for several months.  She has been scheduled for a Lexiscan Myoview stress test.  EKG today reveals sinus rhythm with a rate of 93 with no acute ischemic changes noted.  Shortness of breath and dyspnea on exertion is likely multifactorial but she does have a longstanding history of COPD, asthma, and obstructive sleep apnea as well as obesity.  Previous echocardiogram completed in 2024 revealed an LVEF of 60 to 65% with no RWMA, mild concentric LVH, mildly dilated left and right atrium, and trivial MR.  She has been scheduled for an updated echocardiogram today.  Unilateral swelling to the left lower extremity with pain  and numbness where she is being scheduled for venous ultrasound to rule out DVT to the left lower extremity with her pain and numbness.  Palpitations which she states that she feels that her heart is slamming into her chest and irregular heartbeats.  She has been placed on a ZIO XT monitor for 2 weeks to rule out arrhythmia or pauses.  Hypertension with elevated blood pressure 160/87.  Patient is just had her blood pressure medications prior to arrival to her appointment today.  She also states her primary care provider just increased her enalapril  to 20 mg twice daily and she is also continued on furosemide  40 mg daily.  She has been encouraged to continue to monitor pressure 1 to 2 hours postmedication administration at home as well.  Obstructive sleep apnea which she states that she continues to be compliant with CPAP.  Type 2 diabetes where she is continued on insulin  therapy.  Ongoing management per PCP.  Anxiety and PTSD which she continues to be followed by psychiatry  Mixed hyperlipidemia where she has been maintained on atorvastatin  40 mg daily.  Superobesity with a BMI of 57.76.  Weight loss would be beneficial.  She would benefit from GLP-1.  Her weight does complicate prognoses.  Tobacco abuse with total cessation continue to be recommended    Informed Consent   Shared Decision Making/Informed Consent The risks [chest pain, shortness of breath, cardiac arrhythmias, dizziness, blood pressure fluctuations, myocardial infarction, stroke/transient ischemic attack, nausea, vomiting, allergic reaction, radiation exposure, metallic taste sensation and life-threatening complications (estimated to be 1 in 10,000)], benefits (risk stratification, diagnosing coronary artery disease, treatment guidance) and alternatives of a nuclear stress test were discussed in detail with Desiree Stone and she agrees to proceed.     Dispo: Patient to return to clinic to see MD/APP in 6 weeks or sooner if needed  for further evaluation  Signed, Malaquias Lenker, NP

## 2024-03-28 ENCOUNTER — Ambulatory Visit: Admission: RE | Admit: 2024-03-28 | Source: Ambulatory Visit

## 2024-04-17 DIAGNOSIS — R002 Palpitations: Secondary | ICD-10-CM | POA: Diagnosis not present

## 2024-04-22 ENCOUNTER — Ambulatory Visit: Attending: Cardiology

## 2024-04-22 DIAGNOSIS — M7989 Other specified soft tissue disorders: Secondary | ICD-10-CM | POA: Diagnosis present

## 2024-04-22 DIAGNOSIS — R600 Localized edema: Secondary | ICD-10-CM | POA: Diagnosis not present

## 2024-04-23 ENCOUNTER — Ambulatory Visit: Payer: Self-pay | Admitting: Cardiology

## 2024-04-23 NOTE — Progress Notes (Signed)
 Average heart rate of 86 bpm, 7 nonsustained supraventricular tachycardias lasting up to 18 beats and 1 nonsustained ventricular tachycardia lasting 10 beats.  There was no atrial fibrillation and rare ectopy.  There were no symptom triggered episodes noted.  Recommend starting bisoprolol 2.5 mg daily to help with palpitations.

## 2024-04-24 ENCOUNTER — Ambulatory Visit
Admission: RE | Admit: 2024-04-24 | Discharge: 2024-04-24 | Disposition: A | Source: Ambulatory Visit | Attending: Cardiology | Admitting: Cardiology

## 2024-04-24 DIAGNOSIS — R072 Precordial pain: Secondary | ICD-10-CM | POA: Diagnosis not present

## 2024-04-24 LAB — NM MYOCAR MULTI W/SPECT W/WALL MOTION / EF
LV dias vol: 77 mL (ref 46–106)
LV sys vol: 53 mL (ref 3.8–5.2)
MPHR: 170 {beats}/min
Nuc Stress EF: 31 %
Peak HR: 105 {beats}/min
Percent HR: 61 %
Rest HR: 88 {beats}/min
Rest Nuclear Isotope Dose: 10.7 mCi
SDS: 5
SRS: 0
SSS: 5
ST Depression (mm): 0 mm
Stress Nuclear Isotope Dose: 32.8 mCi
TID: 0.75

## 2024-04-24 MED ORDER — TECHNETIUM TC 99M TETROFOSMIN IV KIT
10.6900 | PACK | Freq: Once | INTRAVENOUS | Status: AC | PRN
  Administered 2024-04-24: 10.69 via INTRAVENOUS

## 2024-04-24 MED ORDER — REGADENOSON 0.4 MG/5ML IV SOLN
0.4000 mg | Freq: Once | INTRAVENOUS | Status: AC
  Administered 2024-04-24: 0.4 mg via INTRAVENOUS

## 2024-04-24 MED ORDER — TECHNETIUM TC 99M TETROFOSMIN IV KIT
32.8000 | PACK | Freq: Once | INTRAVENOUS | Status: AC | PRN
  Administered 2024-04-24: 32.8 via INTRAVENOUS

## 2024-04-25 MED ORDER — BISOPROLOL FUMARATE 2.5 MG PO TABS: 2.5000 mg | ORAL_TABLET | Freq: Every day | ORAL | 3 refills | Status: AC

## 2024-05-03 ENCOUNTER — Other Ambulatory Visit: Payer: Self-pay | Admitting: Psychiatry

## 2024-05-03 DIAGNOSIS — F322 Major depressive disorder, single episode, severe without psychotic features: Secondary | ICD-10-CM

## 2024-05-03 DIAGNOSIS — F4310 Post-traumatic stress disorder, unspecified: Secondary | ICD-10-CM

## 2024-05-06 ENCOUNTER — Ambulatory Visit: Attending: Cardiology | Admitting: Cardiology

## 2024-05-06 ENCOUNTER — Encounter: Payer: Self-pay | Admitting: Cardiology

## 2024-05-06 VITALS — BP 118/80 | HR 82 | Ht 59.0 in | Wt 283.0 lb

## 2024-05-06 DIAGNOSIS — R0789 Other chest pain: Secondary | ICD-10-CM | POA: Diagnosis present

## 2024-05-06 DIAGNOSIS — G4733 Obstructive sleep apnea (adult) (pediatric): Secondary | ICD-10-CM | POA: Diagnosis present

## 2024-05-06 DIAGNOSIS — R600 Localized edema: Secondary | ICD-10-CM | POA: Insufficient documentation

## 2024-05-06 DIAGNOSIS — E782 Mixed hyperlipidemia: Secondary | ICD-10-CM | POA: Diagnosis present

## 2024-05-06 DIAGNOSIS — R0602 Shortness of breath: Secondary | ICD-10-CM | POA: Diagnosis present

## 2024-05-06 DIAGNOSIS — E669 Obesity, unspecified: Secondary | ICD-10-CM | POA: Insufficient documentation

## 2024-05-06 DIAGNOSIS — F419 Anxiety disorder, unspecified: Secondary | ICD-10-CM | POA: Diagnosis present

## 2024-05-06 DIAGNOSIS — E119 Type 2 diabetes mellitus without complications: Secondary | ICD-10-CM | POA: Insufficient documentation

## 2024-05-06 DIAGNOSIS — R002 Palpitations: Secondary | ICD-10-CM | POA: Diagnosis present

## 2024-05-06 DIAGNOSIS — I10 Essential (primary) hypertension: Secondary | ICD-10-CM | POA: Insufficient documentation

## 2024-05-06 DIAGNOSIS — F4310 Post-traumatic stress disorder, unspecified: Secondary | ICD-10-CM | POA: Insufficient documentation

## 2024-05-06 DIAGNOSIS — F17200 Nicotine dependence, unspecified, uncomplicated: Secondary | ICD-10-CM | POA: Insufficient documentation

## 2024-05-06 NOTE — Progress Notes (Unsigned)
 Cardiology Office Note   Date:  05/07/2024  ID:  Desiree Stone, DOB [birth year], MRN [id number] PCP: [facility], [facility]  [facility] Providers Cardiologist:  None Cardiology APP:  Gerard Frederick, NP     History of Present Illness Desiree Stone is a 48 y.o. female with a past medical history of hypertension, OSA, COPD, asthma, acute respiratory failure, type 2 diabetes, severe obesity, depression, current smoker, and palpitations, who presents today for follow-up.   Previously was admitted to [facility] 1/16 - 07/13/2022 with debility, obstructive sleep apnea, central pretension, type 2 diabetes, asthma, difficulty coping, severe obesity, and COPD.  She presented with a 3-week history of productive cough, shortness of breath as well as lower extremity edema.  2D echo was done showing LVEF of 60 to 65% with biatrial dilatation.  CT of the chest showed cardiomegaly with a large passages of pulmonary hypertension reflux contrast into the IVC suggesting right heart dysfunction.  Respiratory status declined due to persistent acidosis requiring intubation.  Acute respiratory failure was in the setting of hypercapnia with hypoxia and possible CHF on arrival in setting of severe morbid obesity, COPD exacerbation and likely sleep apnea.  She was treated with IV diuresis and IV Solu-Medrol  as well as ceftriaxone  for community-acquired pneumonia.  Hospital course was significant for failure next patient is requiring tracheostomy.  She was started on tube feeds for nutritional support poorly controlled blood sugars returned without incident.  She was weaned to a liters of oxygen and downsized to an extra long #6 trach.  She was tolerating p.o. per speaking valve trials and was noted to have moderate hoarseness of voice with intermittent coughing.  She was able to be started on 81 pounds with strict aspiration precautions and was tolerated without signs and symptoms of aspiration.  Therapy was  working with with her but she continued to be limited by weakness, lower extremity numbness with prolonged sitting activity and fatigue with multiple rest breaks with activity as well as tachycardia with ambulating short distances.  CIR was recommended due to functional decline.  She was decannulated in February 2024.  She was admitted to inpatient rehab on 07/05/2022.  Inpatient therapies consisted of PT, ST, and OT at least 3 hours 5 days/week.  She progressed through rehab but was able to be discharged.  On discharge it was recommended that she have a referral to pulmonary as she needed to be retested for OSA for CPAP and continued management of her asthma and COPD.  She followed up with pulmonary 12/26/2022.  Further testing was reviewed and she had original sleep study in 2017 at [facility].  This showed moderate obstructive sleep apnea.  She did use CPAP nightly her current machine was from 2017 and she needed new supplies.  Unfortunately she still continues to smoke.  She did use Chantix  before and stated that it helped.  She had occasional cough and chest congestion and was using Flovent  does not need albuterol  much.  She was still suffering from trauma being hospitalized to being on the ventilator.  She had acute delirium while in the hospital and still had flashbacks.  Felt that she was more anxious and depressed.  She needed a new CPAP machine that was ordered as her device was greater than 70 years old.  She will need updated PFTs after she has stopped smoking.  She was referred to psychiatry for anxiety depression and PTSD after hospitalization.  She was also restarted on Chantix .  Recommendation was  to follow-up in 3 months with pulmonary.  She was last seen in clinic 03/21/2019 follow-up with several concerns.  She had been swelling to both of her lower extremities.  She had been having worsening palpitations, shortness of breath, and had stopped smoking approximately 7 months prior.  With complaints of  chest discomfort she was scheduled for Lexiscan  Myoview  to rule out any ischemic causes.  She was also scheduled for an updated echocardiogram.    She returns to clinic today stating overall she has been feeling better.  Blood pressure is better controlled.  Swelling to the left lower extremity has improved.  She denies any recurrent chest discomfort.  Her shortness of breath has improved.  She denies any worsening palpitations.  States that she has been compliant with her current medication regimen.  She notes that her grandson was sick a few days ago and now she has a runny nose and a cough and plans on following up with pulmonary for antibiotics to prevent reoccurrence of bronchitis.  States that she has not had any further visits to the emergency department or recent hospitalizations.  ROS: 10 point review of system has been reviewed and considered negative the exception was been listed in the HPI  Studies Reviewed     Lexiscan  MPI 04/24/2024 Pharmacological myocardial perfusion imaging study with no significant ischemia Small region fixed defect distal inferolateral wall, unable to exclude attenuation artifact versus small prior MI Normal wall motion, EF estimated at 55% No EKG changes concerning for ischemia at peak stress or in recovery. Low risk scan   Event Monitor ([employer]) 04/16/2024 HR 47 - 190, average 86 bpm. 7 nonsustained SVT (longest 18 beats) and 1 nonsustained VT (longest 10 beats). No atrial fibrillation detected. Rare supraventricular ectopy. Rare ventricular ectopy. No sustained arrhythmias. No symptom trigger episodes.  2d echo 06/07/2022 1. Left ventricular ejection fraction, by estimation, is 60 to 65%. The  left ventricle has normal function. The left ventricle has no regional  wall motion abnormalities. There is mild concentric left ventricular  hypertrophy. Left ventricular diastolic  parameters were normal.   2. Right ventricular systolic function is normal. The  right ventricular  size is normal.   3. Left atrial size was mildly dilated.   4. Right atrial size was mildly dilated.   5. The mitral valve is normal in structure. Trivial mitral valve  regurgitation. No evidence of mitral stenosis.   6. The aortic valve is normal in structure. Aortic valve regurgitation is  not visualized. Aortic valve sclerosis is present, with no evidence of  aortic valve stenosis.   7. The inferior vena cava is normal in size with greater than 50%  respiratory variability, suggesting right atrial pressure of 3 mmHg.   Risk Assessment/Calculations           Physical Exam VS:  BP 118/80 (BP Location: Left Arm, Patient Position: Sitting, Cuff Size: Large)   Pulse 82   Ht 4' 11 (1.499 m)   Wt 283 lb (128.4 kg)   SpO2 98%   BMI 57.16 kg/m        Wt Readings from Last 3 Encounters:  05/06/24 283 lb (128.4 kg)  03/20/24 286 lb (129.7 kg)  12/26/22 285 lb (129.3 kg)    GEN: Well nourished, well developed in no acute distress NECK: No JVD; No carotid bruits CARDIAC: RRR, no murmurs, rubs, gallops RESPIRATORY:  Clear to auscultation with forced expiratory wheezing noted throughout without rales or rhonchi  ABDOMEN: Soft, non-tender,  obese, non-distended EXTREMITIES: Trace pretibial edema; No deformity   ASSESSMENT AND PLAN Hypertension with a blood pressure today of 118/80.  Blood pressure has been well-controlled on her current medication regimen.  She has been continued on enalapril  20 mg twice daily, furosemide  40 mg daily, and bisoprolol  2.5 mg daily.  She has been encouraged to continue to monitor her pressures 1 to 2 hours postmedication administration at home as well.  Palpitations that have improved with better blood pressure control.  Recent event monitor revealed an average heart rate of 86 bpm with 7 nonsustained SVT episodes and 1 nonsustained VT episode.  There was no atrial fibrillation and rare supraventricular ectopy no sustained arrhythmias.  At  that time with the results of her monitor that is when her bisoprolol  2.5 mg daily was started.  Mixed hyperlipidemia with last LDL of 83.  She has been continued on atorvastatin  40 mg daily.  Atypical chest pain that has resolved with better blood pressure control.  She did undergo a Lexiscan  Myoview  which showed no significant ischemia.  There was a small region that showed a fixed defect in the distal inferior lateral wall unable to exclude artifact versus small prior MI.  It was considered a low risk scan.   Shortness of breath and dyspnea on exertion likely multifactorial with longstanding history of COPD, asthma, and obstructive sleep apnea as well as superobesity.  She has been encouraged to continue to maintain follow-ups with pulmonary and to have her PFTs rescheduled.  Peripheral edema that has improved.  She recently had an ultrasound completed of the left lower extremity to rule out DVT for unilateral swelling which was negative.  Edema has improved.  OSA compliant with CPAP.  Type 2 diabetes where she is continued on insulin  therapy with ongoing management per her PCP.  Anxiety and PTSD which she continues to be followed by psychiatry.  Superobesity with a BMI of 57.16.  Weight loss would be beneficial as it does complicate her overall prognosis.  She is continued on Ozempic.  Tobacco abuse with previous history of recurrent bronchitis and a history of having to have a tracheostomy.  Total cessation continues to be recommended.       Dispo: Patient to return to clinic to see MD/APP in 3 months or sooner if needed for further evaluation  Signed, Ethelreda Sukhu, NP

## 2024-05-06 NOTE — Patient Instructions (Signed)
 Medication Instructions:  Your physician recommends that you continue on your current medications as directed. Please refer to the Current Medication list given to you today.   *If you need a refill on your cardiac medications before your next appointment, please call your pharmacy*  Lab Work: No labs ordered today  If you have labs (blood work) drawn today and your tests are completely normal, you will receive your results only by: MyChart Message (if you have MyChart) OR A paper copy in the mail If you have any lab test that is abnormal or we need to change your treatment, we will call you to review the results.  Testing/Procedures: No test ordered today   Follow-Up: At [facility], you and your health needs are our priority.  As part of our continuing mission to provide you with exceptional heart care, our providers are all part of one team.  This team includes your primary Cardiologist (physician) and Advanced Practice Providers or APPs (Physician Assistants and Nurse Practitioners) who all work together to provide you with the care you need, when you need it.  Your next appointment:   3 month(s)  Provider:   Ronald Cockayne, NP

## 2024-08-13 ENCOUNTER — Ambulatory Visit: Admitting: Cardiology
